# Patient Record
Sex: Female | Born: 1950
Health system: Southern US, Community
[De-identification: ages and names within clinical notes are randomized; demographics above are authoritative.]

## PROBLEM LIST (undated history)

## (undated) DIAGNOSIS — Z9889 Other specified postprocedural states: Secondary | ICD-10-CM

## (undated) DIAGNOSIS — I10 Essential (primary) hypertension: Secondary | ICD-10-CM

## (undated) DIAGNOSIS — F329 Major depressive disorder, single episode, unspecified: Secondary | ICD-10-CM

## (undated) DIAGNOSIS — M1990 Unspecified osteoarthritis, unspecified site: Secondary | ICD-10-CM

## (undated) DIAGNOSIS — D573 Sickle-cell trait: Secondary | ICD-10-CM

## (undated) DIAGNOSIS — R112 Nausea with vomiting, unspecified: Secondary | ICD-10-CM

## (undated) DIAGNOSIS — E669 Obesity, unspecified: Secondary | ICD-10-CM

## (undated) DIAGNOSIS — F32A Depression, unspecified: Secondary | ICD-10-CM

## (undated) DIAGNOSIS — R001 Bradycardia, unspecified: Secondary | ICD-10-CM

## (undated) DIAGNOSIS — J020 Streptococcal pharyngitis: Secondary | ICD-10-CM

## (undated) DIAGNOSIS — E785 Hyperlipidemia, unspecified: Secondary | ICD-10-CM

## (undated) DIAGNOSIS — I219 Acute myocardial infarction, unspecified: Secondary | ICD-10-CM

## (undated) DIAGNOSIS — I2510 Atherosclerotic heart disease of native coronary artery without angina pectoris: Secondary | ICD-10-CM

## (undated) DIAGNOSIS — K5730 Diverticulosis of large intestine without perforation or abscess without bleeding: Secondary | ICD-10-CM

## (undated) DIAGNOSIS — I350 Nonrheumatic aortic (valve) stenosis: Secondary | ICD-10-CM

## (undated) DIAGNOSIS — E119 Type 2 diabetes mellitus without complications: Secondary | ICD-10-CM

## (undated) DIAGNOSIS — L03039 Cellulitis of unspecified toe: Secondary | ICD-10-CM

## (undated) DIAGNOSIS — I272 Pulmonary hypertension, unspecified: Secondary | ICD-10-CM

## (undated) HISTORY — DX: Unspecified osteoarthritis, unspecified site: M19.90

## (undated) HISTORY — DX: Hyperlipidemia, unspecified: E78.5

## (undated) HISTORY — DX: Type 2 diabetes mellitus without complications: E11.9

## (undated) HISTORY — DX: Acute myocardial infarction, unspecified: I21.9

## (undated) HISTORY — DX: Bradycardia, unspecified: R00.1

## (undated) HISTORY — DX: Pulmonary hypertension, unspecified: I27.20

## (undated) HISTORY — DX: Cellulitis of unspecified toe: L03.039

## (undated) HISTORY — DX: Depression, unspecified: F32.A

## (undated) HISTORY — DX: Sickle-cell trait: D57.3

## (undated) HISTORY — PX: CARDIAC CATHETERIZATION: SHX172

## (undated) HISTORY — DX: Streptococcal pharyngitis: J02.0

## (undated) HISTORY — DX: Atherosclerotic heart disease of native coronary artery without angina pectoris: I25.10

## (undated) HISTORY — DX: Essential (primary) hypertension: I10

## (undated) HISTORY — PX: JOINT REPLACEMENT: SHX530

## (undated) HISTORY — DX: Obesity, unspecified: E66.9

## (undated) HISTORY — DX: Nonrheumatic aortic (valve) stenosis: I35.0

## (undated) HISTORY — DX: Diverticulosis of large intestine without perforation or abscess without bleeding: K57.30

---
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
## 1998-01-21 ENCOUNTER — Encounter: Admission: RE | Admit: 1998-01-21 | Discharge: 1998-01-21 | Payer: Self-pay | Admitting: Family Medicine

## 1998-03-23 ENCOUNTER — Encounter: Admission: RE | Admit: 1998-03-23 | Discharge: 1998-03-23 | Payer: Self-pay | Admitting: Sports Medicine

## 1999-04-22 ENCOUNTER — Encounter: Admission: RE | Admit: 1999-04-22 | Discharge: 1999-04-22 | Payer: Self-pay | Admitting: Family Medicine

## 1999-09-28 ENCOUNTER — Encounter: Admission: RE | Admit: 1999-09-28 | Discharge: 1999-09-28 | Payer: Self-pay | Admitting: Family Medicine

## 1999-10-04 ENCOUNTER — Encounter: Payer: Self-pay | Admitting: Sports Medicine

## 1999-10-04 ENCOUNTER — Encounter: Admission: RE | Admit: 1999-10-04 | Discharge: 1999-10-04 | Payer: Self-pay | Admitting: *Deleted

## 1999-10-26 ENCOUNTER — Encounter: Admission: RE | Admit: 1999-10-26 | Discharge: 1999-10-26 | Payer: Self-pay | Admitting: Family Medicine

## 1999-11-04 ENCOUNTER — Encounter: Admission: RE | Admit: 1999-11-04 | Discharge: 1999-11-04 | Payer: Self-pay | Admitting: Family Medicine

## 2000-05-31 ENCOUNTER — Encounter: Admission: RE | Admit: 2000-05-31 | Discharge: 2000-05-31 | Payer: Self-pay | Admitting: Family Medicine

## 2000-08-02 ENCOUNTER — Encounter: Admission: RE | Admit: 2000-08-02 | Discharge: 2000-08-02 | Payer: Self-pay | Admitting: Family Medicine

## 2000-08-02 ENCOUNTER — Encounter: Admission: RE | Admit: 2000-08-02 | Discharge: 2000-08-02 | Payer: Self-pay | Admitting: *Deleted

## 2000-08-02 ENCOUNTER — Encounter: Payer: Self-pay | Admitting: Family Medicine

## 2000-08-30 ENCOUNTER — Inpatient Hospital Stay (HOSPITAL_COMMUNITY): Admission: EM | Admit: 2000-08-30 | Discharge: 2000-09-04 | Payer: Self-pay | Admitting: Emergency Medicine

## 2000-08-30 ENCOUNTER — Encounter: Payer: Self-pay | Admitting: Emergency Medicine

## 2000-09-21 ENCOUNTER — Encounter: Admission: RE | Admit: 2000-09-21 | Discharge: 2000-09-21 | Payer: Self-pay | Admitting: Family Medicine

## 2000-09-21 HISTORY — PX: CAROTID STENT: SHX1301

## 2000-09-21 HISTORY — PX: ANGIOPLASTY: SHX39

## 2000-10-05 ENCOUNTER — Encounter: Payer: Self-pay | Admitting: Family Medicine

## 2000-10-05 ENCOUNTER — Encounter: Admission: RE | Admit: 2000-10-05 | Discharge: 2000-10-05 | Payer: Self-pay | Admitting: *Deleted

## 2000-10-24 ENCOUNTER — Encounter: Admission: RE | Admit: 2000-10-24 | Discharge: 2000-10-24 | Payer: Self-pay | Admitting: Family Medicine

## 2000-10-29 ENCOUNTER — Encounter: Payer: Self-pay | Admitting: Cardiology

## 2000-10-29 ENCOUNTER — Ambulatory Visit (HOSPITAL_COMMUNITY): Admission: RE | Admit: 2000-10-29 | Discharge: 2000-10-29 | Payer: Self-pay | Admitting: Cardiology

## 2001-03-27 ENCOUNTER — Encounter: Admission: RE | Admit: 2001-03-27 | Discharge: 2001-03-27 | Payer: Self-pay | Admitting: Family Medicine

## 2001-03-28 ENCOUNTER — Encounter: Admission: RE | Admit: 2001-03-28 | Discharge: 2001-03-28 | Payer: Self-pay | Admitting: Family Medicine

## 2001-04-02 ENCOUNTER — Encounter: Admission: RE | Admit: 2001-04-02 | Discharge: 2001-04-02 | Payer: Self-pay | Admitting: Family Medicine

## 2001-06-27 ENCOUNTER — Encounter: Admission: RE | Admit: 2001-06-27 | Discharge: 2001-06-27 | Payer: Self-pay | Admitting: Family Medicine

## 2001-07-02 ENCOUNTER — Emergency Department (HOSPITAL_COMMUNITY): Admission: EM | Admit: 2001-07-02 | Discharge: 2001-07-02 | Payer: Self-pay | Admitting: Emergency Medicine

## 2001-11-27 ENCOUNTER — Encounter: Admission: RE | Admit: 2001-11-27 | Discharge: 2001-11-27 | Payer: Self-pay | Admitting: Family Medicine

## 2001-11-27 ENCOUNTER — Encounter: Payer: Self-pay | Admitting: Sports Medicine

## 2001-12-12 ENCOUNTER — Encounter: Admission: RE | Admit: 2001-12-12 | Discharge: 2001-12-12 | Payer: Self-pay | Admitting: Family Medicine

## 2001-12-25 ENCOUNTER — Encounter: Admission: RE | Admit: 2001-12-25 | Discharge: 2001-12-25 | Payer: Self-pay | Admitting: Family Medicine

## 2002-01-29 ENCOUNTER — Encounter: Admission: RE | Admit: 2002-01-29 | Discharge: 2002-01-29 | Payer: Self-pay | Admitting: Family Medicine

## 2002-02-19 ENCOUNTER — Encounter: Admission: RE | Admit: 2002-02-19 | Discharge: 2002-02-19 | Payer: Self-pay | Admitting: Family Medicine

## 2002-03-11 ENCOUNTER — Encounter: Admission: RE | Admit: 2002-03-11 | Discharge: 2002-03-11 | Payer: Self-pay | Admitting: Family Medicine

## 2002-09-30 ENCOUNTER — Encounter: Admission: RE | Admit: 2002-09-30 | Discharge: 2002-09-30 | Payer: Self-pay | Admitting: Family Medicine

## 2002-11-14 ENCOUNTER — Encounter: Admission: RE | Admit: 2002-11-14 | Discharge: 2002-11-14 | Payer: Self-pay | Admitting: Family Medicine

## 2003-01-28 ENCOUNTER — Encounter: Admission: RE | Admit: 2003-01-28 | Discharge: 2003-01-28 | Payer: Self-pay | Admitting: Family Medicine

## 2003-02-09 ENCOUNTER — Encounter: Admission: RE | Admit: 2003-02-09 | Discharge: 2003-02-09 | Payer: Self-pay | Admitting: Family Medicine

## 2003-08-06 ENCOUNTER — Encounter: Admission: RE | Admit: 2003-08-06 | Discharge: 2003-08-06 | Payer: Self-pay | Admitting: Family Medicine

## 2003-09-30 ENCOUNTER — Encounter: Admission: RE | Admit: 2003-09-30 | Discharge: 2003-09-30 | Payer: Self-pay | Admitting: Sports Medicine

## 2003-10-14 ENCOUNTER — Ambulatory Visit (HOSPITAL_COMMUNITY): Admission: RE | Admit: 2003-10-14 | Discharge: 2003-10-14 | Payer: Self-pay | Admitting: Sports Medicine

## 2004-04-14 ENCOUNTER — Encounter: Admission: RE | Admit: 2004-04-14 | Discharge: 2004-04-14 | Payer: Self-pay | Admitting: Family Medicine

## 2004-05-07 ENCOUNTER — Emergency Department (HOSPITAL_COMMUNITY): Admission: AD | Admit: 2004-05-07 | Discharge: 2004-05-07 | Payer: Self-pay | Admitting: *Deleted

## 2004-05-18 ENCOUNTER — Encounter: Admission: RE | Admit: 2004-05-18 | Discharge: 2004-05-18 | Payer: Self-pay | Admitting: Sports Medicine

## 2004-07-22 ENCOUNTER — Ambulatory Visit: Payer: Self-pay | Admitting: Family Medicine

## 2004-10-14 ENCOUNTER — Ambulatory Visit: Payer: Self-pay | Admitting: Family Medicine

## 2004-11-03 ENCOUNTER — Ambulatory Visit (HOSPITAL_COMMUNITY): Admission: RE | Admit: 2004-11-03 | Discharge: 2004-11-03 | Payer: Self-pay | Admitting: Obstetrics & Gynecology

## 2005-03-27 ENCOUNTER — Ambulatory Visit: Payer: Self-pay | Admitting: Family Medicine

## 2005-08-30 ENCOUNTER — Ambulatory Visit: Payer: Self-pay | Admitting: Family Medicine

## 2005-11-09 ENCOUNTER — Encounter (INDEPENDENT_AMBULATORY_CARE_PROVIDER_SITE_OTHER): Payer: Self-pay | Admitting: *Deleted

## 2005-11-09 LAB — CONVERTED CEMR LAB

## 2005-11-22 ENCOUNTER — Ambulatory Visit: Payer: Self-pay | Admitting: Family Medicine

## 2005-12-20 ENCOUNTER — Ambulatory Visit (HOSPITAL_COMMUNITY): Admission: RE | Admit: 2005-12-20 | Discharge: 2005-12-20 | Payer: Self-pay | Admitting: Family Medicine

## 2005-12-21 ENCOUNTER — Ambulatory Visit: Payer: Self-pay | Admitting: Family Medicine

## 2006-01-10 ENCOUNTER — Encounter: Admission: RE | Admit: 2006-01-10 | Discharge: 2006-01-10 | Payer: Self-pay | Admitting: Sports Medicine

## 2006-01-19 ENCOUNTER — Ambulatory Visit: Payer: Self-pay | Admitting: Family Medicine

## 2006-08-07 ENCOUNTER — Ambulatory Visit: Payer: Self-pay | Admitting: Family Medicine

## 2006-12-06 DIAGNOSIS — I10 Essential (primary) hypertension: Secondary | ICD-10-CM | POA: Insufficient documentation

## 2006-12-06 DIAGNOSIS — M1710 Unilateral primary osteoarthritis, unspecified knee: Secondary | ICD-10-CM

## 2006-12-06 DIAGNOSIS — D573 Sickle-cell trait: Secondary | ICD-10-CM

## 2006-12-06 DIAGNOSIS — E119 Type 2 diabetes mellitus without complications: Secondary | ICD-10-CM | POA: Insufficient documentation

## 2006-12-06 DIAGNOSIS — IMO0002 Reserved for concepts with insufficient information to code with codable children: Secondary | ICD-10-CM | POA: Insufficient documentation

## 2006-12-06 DIAGNOSIS — I2510 Atherosclerotic heart disease of native coronary artery without angina pectoris: Secondary | ICD-10-CM | POA: Insufficient documentation

## 2006-12-06 DIAGNOSIS — E669 Obesity, unspecified: Secondary | ICD-10-CM | POA: Insufficient documentation

## 2006-12-07 ENCOUNTER — Encounter (INDEPENDENT_AMBULATORY_CARE_PROVIDER_SITE_OTHER): Payer: Self-pay | Admitting: *Deleted

## 2006-12-25 ENCOUNTER — Telehealth (INDEPENDENT_AMBULATORY_CARE_PROVIDER_SITE_OTHER): Payer: Self-pay | Admitting: Family Medicine

## 2006-12-26 ENCOUNTER — Ambulatory Visit (HOSPITAL_COMMUNITY): Admission: RE | Admit: 2006-12-26 | Discharge: 2006-12-26 | Payer: Self-pay | Admitting: Sports Medicine

## 2006-12-26 ENCOUNTER — Encounter (INDEPENDENT_AMBULATORY_CARE_PROVIDER_SITE_OTHER): Payer: Self-pay | Admitting: Family Medicine

## 2007-01-03 ENCOUNTER — Encounter (INDEPENDENT_AMBULATORY_CARE_PROVIDER_SITE_OTHER): Payer: Self-pay | Admitting: Family Medicine

## 2007-01-04 ENCOUNTER — Encounter (INDEPENDENT_AMBULATORY_CARE_PROVIDER_SITE_OTHER): Payer: Self-pay | Admitting: Family Medicine

## 2007-03-06 ENCOUNTER — Ambulatory Visit: Payer: Self-pay | Admitting: Family Medicine

## 2007-03-06 LAB — CONVERTED CEMR LAB: Hgb A1c MFr Bld: 6.4 %

## 2007-03-12 ENCOUNTER — Telehealth (INDEPENDENT_AMBULATORY_CARE_PROVIDER_SITE_OTHER): Payer: Self-pay | Admitting: *Deleted

## 2007-05-07 ENCOUNTER — Telehealth: Payer: Self-pay | Admitting: *Deleted

## 2007-05-27 ENCOUNTER — Telehealth: Payer: Self-pay | Admitting: Family Medicine

## 2007-08-06 ENCOUNTER — Encounter (INDEPENDENT_AMBULATORY_CARE_PROVIDER_SITE_OTHER): Payer: Self-pay | Admitting: Family Medicine

## 2007-08-06 ENCOUNTER — Ambulatory Visit (HOSPITAL_COMMUNITY): Admission: RE | Admit: 2007-08-06 | Discharge: 2007-08-06 | Payer: Self-pay | Admitting: Family Medicine

## 2007-08-06 ENCOUNTER — Ambulatory Visit: Payer: Self-pay | Admitting: Family Medicine

## 2007-08-06 LAB — CONVERTED CEMR LAB
ALT: 18 units/L (ref 0–35)
AST: 19 units/L (ref 0–37)
Albumin: 4.4 g/dL (ref 3.5–5.2)
Alkaline Phosphatase: 66 units/L (ref 39–117)
BUN: 16 mg/dL (ref 6–23)
CO2: 27 meq/L (ref 19–32)
Calcium: 10 mg/dL (ref 8.4–10.5)
Chloride: 104 meq/L (ref 96–112)
Cholesterol: 170 mg/dL (ref 0–200)
Creatinine, Ser: 0.81 mg/dL (ref 0.40–1.20)
Glucose, Bld: 99 mg/dL (ref 70–99)
HDL: 46 mg/dL (ref >39)
Hgb A1c MFr Bld: 6.6 %
LDL Cholesterol: 91 mg/dL (ref 0–99)
Potassium: 4 meq/L (ref 3.5–5.3)
Sodium: 142 meq/L (ref 135–145)
TSH: 0.972 microintl units/mL (ref 0.350–5.50)
Total Bilirubin: 0.4 mg/dL (ref 0.3–1.2)
Total CHOL/HDL Ratio: 3.7
Total Protein: 7.2 g/dL (ref 6.0–8.3)
Triglycerides: 163 mg/dL — ABNORMAL HIGH (ref <150)
VLDL: 33 mg/dL (ref 0–40)

## 2007-08-08 ENCOUNTER — Encounter (INDEPENDENT_AMBULATORY_CARE_PROVIDER_SITE_OTHER): Payer: Self-pay | Admitting: Family Medicine

## 2007-08-08 ENCOUNTER — Ambulatory Visit: Payer: Self-pay | Admitting: Family Medicine

## 2007-08-08 ENCOUNTER — Telehealth (INDEPENDENT_AMBULATORY_CARE_PROVIDER_SITE_OTHER): Payer: Self-pay | Admitting: Family Medicine

## 2007-08-09 ENCOUNTER — Encounter (INDEPENDENT_AMBULATORY_CARE_PROVIDER_SITE_OTHER): Payer: Self-pay | Admitting: Family Medicine

## 2007-09-02 ENCOUNTER — Telehealth: Payer: Self-pay | Admitting: *Deleted

## 2007-10-09 ENCOUNTER — Ambulatory Visit: Payer: Self-pay | Admitting: Family Medicine

## 2007-10-14 ENCOUNTER — Telehealth: Payer: Self-pay | Admitting: *Deleted

## 2007-10-16 ENCOUNTER — Ambulatory Visit: Payer: Self-pay | Admitting: Cardiovascular Disease

## 2007-10-16 ENCOUNTER — Ambulatory Visit (HOSPITAL_COMMUNITY): Admission: RE | Admit: 2007-10-16 | Discharge: 2007-10-16 | Payer: Self-pay | Admitting: Sports Medicine

## 2007-11-06 ENCOUNTER — Ambulatory Visit: Payer: Self-pay | Admitting: Cardiology

## 2007-11-25 ENCOUNTER — Ambulatory Visit: Payer: Self-pay

## 2007-11-25 ENCOUNTER — Ambulatory Visit: Payer: Self-pay | Admitting: Cardiology

## 2007-11-25 LAB — CONVERTED CEMR LAB
ALT: 23 units/L (ref 0–35)
AST: 21 units/L (ref 0–37)
Albumin: 3.9 g/dL (ref 3.5–5.2)
Bilirubin, Direct: 0.1 mg/dL (ref 0.0–0.3)
Calcium: 9.6 mg/dL (ref 8.4–10.5)
Chloride: 104 meq/L (ref 96–112)
Cholesterol: 158 mg/dL (ref 0–200)
Creatinine, Ser: 0.8 mg/dL (ref 0.4–1.2)
GFR calc non Af Amer: 79 mL/min
Glucose, Bld: 115 mg/dL — ABNORMAL HIGH (ref 70–99)
HDL: 44.3 mg/dL (ref >39.0)
LDL Cholesterol: 93 mg/dL (ref 0–99)
Sodium: 141 meq/L (ref 135–145)
Total Bilirubin: 0.7 mg/dL (ref 0.3–1.2)

## 2007-12-05 ENCOUNTER — Ambulatory Visit: Payer: Self-pay | Admitting: Cardiology

## 2008-05-22 ENCOUNTER — Encounter: Payer: Self-pay | Admitting: *Deleted

## 2008-06-02 ENCOUNTER — Ambulatory Visit: Payer: Self-pay | Admitting: Family Medicine

## 2008-06-02 ENCOUNTER — Encounter (INDEPENDENT_AMBULATORY_CARE_PROVIDER_SITE_OTHER): Payer: Self-pay | Admitting: Family Medicine

## 2008-06-02 DIAGNOSIS — I359 Nonrheumatic aortic valve disorder, unspecified: Secondary | ICD-10-CM | POA: Insufficient documentation

## 2008-06-02 LAB — CONVERTED CEMR LAB
BUN: 12 mg/dL (ref 6–23)
CO2: 25 meq/L (ref 19–32)
Calcium: 9.5 mg/dL (ref 8.4–10.5)
Chloride: 104 meq/L (ref 96–112)
Cholesterol: 146 mg/dL (ref 0–200)
Creatinine, Ser: 0.72 mg/dL (ref 0.40–1.20)
HDL: 43 mg/dL (ref >39)
Hgb A1c MFr Bld: 6.6 %
Total Bilirubin: 0.4 mg/dL (ref 0.3–1.2)
Total CHOL/HDL Ratio: 3.4
VLDL: 25 mg/dL (ref 0–40)

## 2008-06-03 ENCOUNTER — Encounter (INDEPENDENT_AMBULATORY_CARE_PROVIDER_SITE_OTHER): Payer: Self-pay | Admitting: Family Medicine

## 2008-07-13 ENCOUNTER — Ambulatory Visit: Payer: Self-pay | Admitting: Family Medicine

## 2008-08-10 ENCOUNTER — Telehealth: Payer: Self-pay | Admitting: *Deleted

## 2008-09-01 ENCOUNTER — Ambulatory Visit: Payer: Self-pay | Admitting: Family Medicine

## 2008-09-01 ENCOUNTER — Encounter (INDEPENDENT_AMBULATORY_CARE_PROVIDER_SITE_OTHER): Payer: Self-pay | Admitting: Family Medicine

## 2008-09-02 ENCOUNTER — Ambulatory Visit (HOSPITAL_COMMUNITY): Admission: RE | Admit: 2008-09-02 | Discharge: 2008-09-02 | Payer: Self-pay | Admitting: Family Medicine

## 2008-10-16 ENCOUNTER — Encounter (INDEPENDENT_AMBULATORY_CARE_PROVIDER_SITE_OTHER): Payer: Self-pay | Admitting: Family Medicine

## 2008-10-16 DIAGNOSIS — K5730 Diverticulosis of large intestine without perforation or abscess without bleeding: Secondary | ICD-10-CM | POA: Insufficient documentation

## 2008-12-17 ENCOUNTER — Telehealth (INDEPENDENT_AMBULATORY_CARE_PROVIDER_SITE_OTHER): Payer: Self-pay | Admitting: Family Medicine

## 2008-12-22 ENCOUNTER — Encounter (INDEPENDENT_AMBULATORY_CARE_PROVIDER_SITE_OTHER): Payer: Self-pay | Admitting: Family Medicine

## 2008-12-22 ENCOUNTER — Telehealth (INDEPENDENT_AMBULATORY_CARE_PROVIDER_SITE_OTHER): Payer: Self-pay | Admitting: Family Medicine

## 2009-01-14 ENCOUNTER — Telehealth (INDEPENDENT_AMBULATORY_CARE_PROVIDER_SITE_OTHER): Payer: Self-pay | Admitting: Family Medicine

## 2009-01-20 ENCOUNTER — Telehealth (INDEPENDENT_AMBULATORY_CARE_PROVIDER_SITE_OTHER): Payer: Self-pay | Admitting: Family Medicine

## 2009-01-29 ENCOUNTER — Encounter (INDEPENDENT_AMBULATORY_CARE_PROVIDER_SITE_OTHER): Payer: Self-pay | Admitting: Family Medicine

## 2009-01-29 ENCOUNTER — Ambulatory Visit: Payer: Self-pay | Admitting: Family Medicine

## 2009-01-29 LAB — CONVERTED CEMR LAB
BUN: 14 mg/dL (ref 6–23)
CO2: 25 meq/L (ref 19–32)
Calcium: 9.3 mg/dL (ref 8.4–10.5)
Chloride: 103 meq/L (ref 96–112)
Cholesterol: 121 mg/dL (ref 0–200)
Creatinine, Ser: 0.88 mg/dL (ref 0.40–1.20)
Glucose, Bld: 100 mg/dL — ABNORMAL HIGH (ref 70–99)
HDL: 39 mg/dL — ABNORMAL LOW (ref >39)
Total Bilirubin: 0.3 mg/dL (ref 0.3–1.2)
Total CHOL/HDL Ratio: 3.1
Triglycerides: 90 mg/dL (ref <150)

## 2009-02-01 ENCOUNTER — Encounter (INDEPENDENT_AMBULATORY_CARE_PROVIDER_SITE_OTHER): Payer: Self-pay | Admitting: Family Medicine

## 2009-05-31 ENCOUNTER — Ambulatory Visit: Payer: Self-pay | Admitting: Family Medicine

## 2009-05-31 DIAGNOSIS — E785 Hyperlipidemia, unspecified: Secondary | ICD-10-CM | POA: Insufficient documentation

## 2009-05-31 LAB — CONVERTED CEMR LAB: Hgb A1c MFr Bld: 6.7 %

## 2009-08-19 ENCOUNTER — Telehealth: Payer: Self-pay | Admitting: Family Medicine

## 2009-08-23 ENCOUNTER — Telehealth (INDEPENDENT_AMBULATORY_CARE_PROVIDER_SITE_OTHER): Payer: Self-pay | Admitting: *Deleted

## 2009-09-22 ENCOUNTER — Ambulatory Visit: Payer: Self-pay | Admitting: Family Medicine

## 2009-09-22 ENCOUNTER — Encounter: Payer: Self-pay | Admitting: Family Medicine

## 2009-09-22 DIAGNOSIS — L03039 Cellulitis of unspecified toe: Secondary | ICD-10-CM

## 2009-09-22 LAB — CONVERTED CEMR LAB
BUN: 15 mg/dL (ref 6–23)
CO2: 25 meq/L (ref 19–32)
Calcium: 9.6 mg/dL (ref 8.4–10.5)
Chloride: 105 meq/L (ref 96–112)
Creatinine, Ser: 0.73 mg/dL (ref 0.40–1.20)
Glucose, Bld: 105 mg/dL — ABNORMAL HIGH (ref 70–99)
Hgb A1c MFr Bld: 6.4 %
Potassium: 4.2 meq/L (ref 3.5–5.3)
Sodium: 143 meq/L (ref 135–145)

## 2009-11-26 ENCOUNTER — Telehealth: Payer: Self-pay | Admitting: Family Medicine

## 2009-11-26 ENCOUNTER — Ambulatory Visit: Payer: Self-pay | Admitting: Family Medicine

## 2009-11-26 DIAGNOSIS — J020 Streptococcal pharyngitis: Secondary | ICD-10-CM | POA: Insufficient documentation

## 2009-11-26 DIAGNOSIS — J029 Acute pharyngitis, unspecified: Secondary | ICD-10-CM

## 2010-02-28 ENCOUNTER — Telehealth: Payer: Self-pay | Admitting: Family Medicine

## 2010-03-01 ENCOUNTER — Telehealth: Payer: Self-pay | Admitting: *Deleted

## 2010-03-03 ENCOUNTER — Telehealth: Payer: Self-pay | Admitting: *Deleted

## 2010-03-03 ENCOUNTER — Encounter: Payer: Self-pay | Admitting: *Deleted

## 2010-05-02 ENCOUNTER — Ambulatory Visit: Payer: Self-pay | Admitting: Family Medicine

## 2010-05-02 LAB — CONVERTED CEMR LAB: Hgb A1c MFr Bld: 6.5 %

## 2010-05-04 ENCOUNTER — Ambulatory Visit: Payer: Self-pay | Admitting: Family Medicine

## 2010-06-10 ENCOUNTER — Encounter: Payer: Self-pay | Admitting: *Deleted

## 2010-06-21 ENCOUNTER — Ambulatory Visit: Payer: Self-pay | Admitting: Family Medicine

## 2010-06-21 DIAGNOSIS — M1990 Unspecified osteoarthritis, unspecified site: Secondary | ICD-10-CM | POA: Insufficient documentation

## 2010-08-09 ENCOUNTER — Encounter (INDEPENDENT_AMBULATORY_CARE_PROVIDER_SITE_OTHER): Payer: Self-pay | Admitting: Pharmacist

## 2010-08-31 ENCOUNTER — Ambulatory Visit (HOSPITAL_COMMUNITY)
Admission: RE | Admit: 2010-08-31 | Discharge: 2010-08-31 | Payer: Self-pay | Source: Home / Self Care | Admitting: Family Medicine

## 2010-10-04 ENCOUNTER — Ambulatory Visit: Payer: Self-pay | Admitting: Family Medicine

## 2010-10-04 ENCOUNTER — Encounter: Payer: Self-pay | Admitting: Family Medicine

## 2010-10-04 DIAGNOSIS — M79609 Pain in unspecified limb: Secondary | ICD-10-CM

## 2010-10-04 LAB — CONVERTED CEMR LAB
ALT: 16 units/L (ref 0–35)
AST: 20 units/L (ref 0–37)
Albumin: 4.4 g/dL (ref 3.5–5.2)
Alkaline Phosphatase: 63 units/L (ref 39–117)
BUN: 16 mg/dL (ref 6–23)
CO2: 24 meq/L (ref 19–32)
Calcium: 9.6 mg/dL (ref 8.4–10.5)
Chloride: 104 meq/L (ref 96–112)
Cholesterol: 176 mg/dL (ref 0–200)
Creatinine, Ser: 0.69 mg/dL (ref 0.40–1.20)
Glucose, Bld: 103 mg/dL — ABNORMAL HIGH (ref 70–99)
HCT: 34.2 % — ABNORMAL LOW (ref 36.0–46.0)
HDL: 52 mg/dL (ref >39)
Hemoglobin: 11.1 g/dL — ABNORMAL LOW (ref 12.0–15.0)
Hgb A1c MFr Bld: 6.2 %
LDL Cholesterol: 105 mg/dL — ABNORMAL HIGH (ref 0–99)
MCHC: 32.5 g/dL (ref 30.0–36.0)
MCV: 85.9 fL (ref 78.0–100.0)
Platelets: 384 10*3/uL (ref 150–400)
Potassium: 4 meq/L (ref 3.5–5.3)
RBC: 3.98 M/uL (ref 3.87–5.11)
RDW: 15.5 % (ref 11.5–15.5)
Sodium: 142 meq/L (ref 135–145)
Total Bilirubin: 0.3 mg/dL (ref 0.3–1.2)
Total CHOL/HDL Ratio: 3.4
Total CK: 139 units/L (ref 7–177)
Total Protein: 7.3 g/dL (ref 6.0–8.3)
Triglycerides: 95 mg/dL (ref <150)
VLDL: 19 mg/dL (ref 0–40)
Vitamin B-12: 345 pg/mL (ref 211–911)
WBC: 5 10*3/uL (ref 4.0–10.5)

## 2010-10-30 ENCOUNTER — Encounter: Payer: Self-pay | Admitting: Family Medicine

## 2010-11-04 ENCOUNTER — Ambulatory Visit: Admit: 2010-11-04 | Payer: Self-pay | Admitting: Cardiology

## 2010-11-08 NOTE — Assessment & Plan Note (Signed)
Summary: sore throat/[place]/wallace   Vital Signs:  Patient profile:   60 year old female Height:      61.5 inches Weight:      217.9 pounds BMI:     40.65 Temp:     98.0 degrees F oral Pulse rate:   100 / minute BP sitting:   116 / 70  (left arm) Cuff size:   large  Vitals Entered By: Gladstone Pih (November 26, 2009 2:35 PM) CC: C/O sore throat X 1 day Is Patient Diabetic? Yes Did you bring your meter with you today? No Pain Assessment Patient in pain? no        Primary Care Provider:  Helane Rima DO  CC:  C/O sore throat X 1 day.  History of Present Illness:    Pt presents with sore throat x 1 day. 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 She denies any fever, sob, n/v, diarrhea tolerating her meds as previous.   Habits & Providers  Alcohol-Tobacco-Diet     Tobacco Status: never  Current Medications (verified): 1)  Isosorbide Mononitrate Cr 30 Mg Tb24 (Isosorbide Mononitrate) .... Take 1 Tablet By Mouth Once A Day 2)  Metformin Hcl 1000 Mg Tabs (Metformin Hcl) .... Take 1 Tablet By Mouth Two Times A Day 3)  Metoprolol Tartrate 100 Mg Tabs (Metoprolol Tartrate) .... 1/2  Tablet By Mouth Two Times A Day 4)  Norvasc 5 Mg Tabs (Amlodipine Besylate) .Marland Kitchen.. 1 Tablet By Mouth Daily For Blood Pressure 5)  Simvastatin 80 Mg Tabs (Simvastatin) .Marland Kitchen.. 1 By Mouth Daily For Cholesterol 6)  Bayer Aspirin 325 Mg Tabs (Aspirin) .Marland Kitchen.. 1 Tablet By Mouth Daily 7)  Calcium 600 1500 Mg Tabs (Calcium Carbonate) .Marland Kitchen.. 1 Tablet By Mouth Daily 8)  Fish Oil 1200 Mg Caps (Omega-3 Fatty Acids) .Marland Kitchen.. 1 Tablet By Mouth Two Times A Day 9)  Hydrochlorothiazide 25 Mg  Tabs (Hydrochlorothiazide) .... Take 1 Tab By Mouth Every Morning  Allergies (verified): 1)  ! Ace Inhibitors  Review of Systems       Per HPI  Physical Exam  General:  obese, pleasant. vitals reviewed. Eyes:  No corneal or conjunctival inflammation  noted. EOMI. Perrla. 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 Given red flags to return Her updated medication list for this problem includes:    Bayer Aspirin 325 Mg Tabs (Aspirin) .Marland Kitchen... 1 tablet by mouth daily  Orders: [facility]- Est Level  3 ([address]213) Bicillin LA 1.2 million units Injection (S0630)  Complete Medication List: 1)  Isosorbide Mononitrate Cr 30 Mg Tb24 (Isosorbide mononitrate) .... Take 1 tablet by mouth once a day 2)  Metformin Hcl 1000 Mg Tabs (Metformin hcl) .... Take 1 tablet by mouth two times a day 3)  Metoprolol Tartrate 100 Mg Tabs (Metoprolol tartrate) .... 1/2  tablet by mouth two times a day 4)  Norvasc 5 Mg Tabs (Amlodipine besylate) .Marland Kitchen.. 1 tablet by mouth daily for blood pressure 5)  Simvastatin 80 Mg Tabs (Simvastatin) .Marland Kitchen.. 1 by mouth daily for cholesterol 6)  Bayer Aspirin 325 Mg Tabs (Aspirin) .Marland Kitchen.. 1 tablet by mouth daily 7)  Calcium  600 1500 Mg Tabs (Calcium carbonate) .Marland Kitchen.. 1 tablet by mouth daily 8)  Fish Oil 1200 Mg Caps (Omega-3 fatty acids) .Marland Kitchen.. 1 tablet by mouth two times a day 9)  Hydrochlorothiazide 25 Mg Tabs (Hydrochlorothiazide) .... Take 1 tab by mouth every morning  Other Orders: Rapid Strep-[facility] ([address])  Patient Instructions: 1)  Return to see Dr. Earlene Plater as scheduled 2)  If you have a high fever, vomiting, diarrhea or problems breathing let us know.   Laboratory Results  Date/Time Received: November 26, 2009 2:48 PM  Date/Time Reported: November 26, 2009 3:03 PM   Other Tests  Rapid Strep: positive Comments: ...............test performed by......Marland KitchenBonnie A. [place], MLS (ASCP)cm     Medication Administration  Injection #  1:    Medication: Bicillin LA 1.2 million units Injection    Diagnosis: STREPTOCOCCAL PHARYNGITIS (ICD-034.0)    Route: IM    Site: LUOQ gluteus    Exp Date: 04/2012    Lot #: [id number]    Mfr: Brooke Dare    Patient tolerated injection without complications    Given by: Gladstone Pih (November 26, 2009 3:30 PM)  Orders Added: 1)  Rapid Strep-[facility] [87430] 2)  [facility]- Est Level  3 [99213] 3)  Bicillin LA 1.2 million units Injection [J0570]

## 2010-11-08 NOTE — Progress Notes (Signed)
Summary: Rx REq   Phone Note Refill Request Message from:  Patient  Refills Requested: Medication #1:  SIMVASTATIN 80 MG TABS 1 by mouth daily for cholesterol [address].  Initial call taken by: Clydell Hakim,  Mar 03, 2010 3:25 PM  Follow-up for Phone Call        please send to Algis Liming. Follow-up by: Helane Rima DO,  Mar 04, 2010 12:40 PM    Prescriptions: SIMVASTATIN 80 MG TABS (SIMVASTATIN) 1 by mouth daily for cholesterol  #90 x 0   Entered by:   Arlyss Repress CMA,   Authorized by:   Helane Rima DO   Signed by:   Arlyss Repress CMA, on 03/04/2010   Method used:   Telephoned to ...       [facility] [address] (retail)       [address]       [place], [place]  [address]       Ph: [phone]       Fax: [phone]   RxID:   [id number]

## 2010-11-08 NOTE — Assessment & Plan Note (Signed)
Summary: diabetic ck,tcb   Vital Signs:  Patient profile:   60 year old female Height:      61.5 inches Weight:      225 pounds BMI:     41.98 Temp:     98.0 degrees F oral Pulse rate:   53 / minute Pulse rhythm:   regular BP sitting:   118 / 71  (right arm)  Vitals Entered By: Arlyss Repress CMA, (May 31, 2009 9:28 AM) CC: Follow up visit for DM Is Patient Diabetic? Yes  Pain Assessment Patient in pain? no        Diabetic Foot Exam Foot Inspection Is there a history of a foot ulcer?              No Is there a foot ulcer now?              No Can the patient see the bottom of their feet?          Yes Are the shoes appropriate in style and fit?          Yes Is there swelling or an abnormal foot shape?          No Are the toenails long?                No Are the toenails thick?                No Are the toenails ingrown?              No Is there heavy callous build-up?              No Is there pain in the calf muscle (Intermittent claudication) when walking?    NoIs there a claw toe deformity?              No Is there elevated skin temperature?            No Is there limited ankle dorsiflexion?            No Is there foot or ankle muscle weakness?            No  Diabetic Foot Care Education Patient educated on appropriate care of diabetic feet.  Pulse Check          Right Foot          Left Foot Posterior Tibial:        normal            normal Dorsalis Pedis:        normal            normal    10-g (5.07) Semmes-Weinstein Monofilament Test Performed by: Helane Rima DO          Right Foot          Left Foot Visual Inspection               Test Control      normal         normal Site 1         normal         normal Site 2         normal         normal Site 3         normal         normal Site 4         normal  normal Site 5         normal         normal Site 6         normal         normal Site 7         normal         normal Site 8         normal          normal Site 9         normal         normal Site 10         normal         normal  Impression      normal         normal   Primary Care Provider:  Helane Rima DO  CC:  Follow up visit for DM.  History of Present Illness: 60 year old obese AAF with:  1. HTN: Rx: metoprolol, norvasc, HCTZ.  had cough with ACE. denies CP, SOB, N/V/D, LE edema, HA, dizziness. + MI in 2001. has cardiologist. will see in december. today, BP 118/71, HR 53.  2. HLD: Rx: simvastatin 80mg . last FLP revealed LDL 78 (goal < 70).  3. DM2: does not check BG, states that glucometer broken. she has lost 6 pounds through diet and exercise, she has been avoiding foods with sugar and not eating after 7:30 pm, walking daily. she denies numbness/tingling in hands/feet, vision changes. she checks her feet often. last eye appt was 2 years ago.   4.  Obesity: has lost 6 pounds since last visit. Walking daily  ~1 mile.   Habits & Providers  Alcohol-Tobacco-Diet     Tobacco Status: quit > 6 months  Current Medications (verified): 1)  Isosorbide Mononitrate Cr 30 Mg Tb24 (Isosorbide Mononitrate) .... Take 1 Tablet By Mouth Once A Day 2)  Metformin Hcl 1000 Mg Tabs (Metformin Hcl) .... Take 1 Tablet By Mouth Two Times A Day 3)  Metoprolol Tartrate 100 Mg Tabs (Metoprolol Tartrate) .... 1/2  Tablet By Mouth Two Times A Day 4)  Norvasc 5 Mg Tabs (Amlodipine Besylate) .Marland Kitchen.. 1 Tablet By Mouth Daily For Blood Pressure 5)  Simvastatin 80 Mg Tabs (Simvastatin) .Marland Kitchen.. 1 By Mouth Daily For Cholesterol 6)  Bayer Aspirin 325 Mg Tabs (Aspirin) .Marland Kitchen.. 1 Tablet By Mouth Daily 7)  Calcium 600 1500 Mg Tabs (Calcium Carbonate) .Marland Kitchen.. 1 Tablet By Mouth Daily 8)  Fish Oil 1200 Mg Caps (Omega-3 Fatty Acids) .Marland Kitchen.. 1 Tablet By Mouth Two Times A Day 9)  Hydrochlorothiazide 25 Mg  Tabs (Hydrochlorothiazide) .... Take 1 Tab By Mouth Every Morning  Allergies (verified): 1)  ! Ace Inhibitors  Review of Systems       per HPI, otherwise  negative  Physical Exam  General:  obese, pleasant. vital signs reviewed. Lungs:  CTAB Heart:  bradycardic, regular rhythm, and grade 2/6 systolic ejection murmur, loudest at LUSB.     Extremities:  no edema  Diabetes Management Exam:    Foot Exam (with socks and/or shoes not present):       Sensory-Monofilament:          Left foot: normal          Right foot: normal   Impression & Recommendations:  Problem # 1:  DIABETES MELLITUS II, UNCOMPLICATED (ICD-250.00) Assessment Unchanged A1c = 6.7. No change to medication regimen. Continue with diet and exercise. Advised eye appt.  Her  updated medication list for this problem includes:    Metformin Hcl 1000 Mg Tabs (Metformin hcl) .Marland Kitchen... Take 1 tablet by mouth two times a day    Bayer Aspirin 325 Mg Tabs (Aspirin) .Marland Kitchen... 1 tablet by mouth daily  Orders: A1C-[facility] ([address]) [facility]- Est  Level 4 ([address]454)  Problem # 2:  OBESITY, NOS (ICD-278.00) Assessment: Improved  Congratulated her on weight loss efforts.  Orders: [facility]- Est  Level 4 (09811)  Problem # 3:  HYPERTENSION, BENIGN SYSTEMIC (ICD-401.1) HR low. Decreased Metoprolol to 1/2 pill daily. Follow up in 1-2 weeks for BP check. Patient had cough with ACE. Will add ARB as soon as it is affordable.  Her updated medication list for this problem includes:    Metoprolol Tartrate 100 Mg Tabs (Metoprolol tartrate) .Marland Kitchen... 1/2  tablet by mouth two times a day    Norvasc 5 Mg Tabs (Amlodipine besylate) .Marland Kitchen... 1 tablet by mouth daily for blood pressure    Hydrochlorothiazide 25 Mg Tabs (Hydrochlorothiazide) .Marland Kitchen... Take 1 tab by mouth every morning  Orders: [facility]- Est  Level 4 (99214)Future Orders: Basic Met-[facility] ([address]) ... 05/27/2010  Problem # 4:  HYPERLIPIDEMIA, CONTROLLED (ICD-272.4) Assessment: Unchanged  Her updated medication list for this problem includes:    Simvastatin 80 Mg Tabs (Simvastatin) .Marland Kitchen... 1 by mouth daily for cholesterol  Orders: [facility]- Est  Level 4 ([address]86)  Complete  Medication List: 1)  Isosorbide Mononitrate Cr 30 Mg Tb24 (Isosorbide mononitrate) .... Take 1 tablet by mouth once a day 2)  Metformin Hcl 1000 Mg Tabs (Metformin hcl) .... Take 1 tablet by mouth two times a day 3)  Metoprolol Tartrate 100 Mg Tabs (Metoprolol tartrate) .... 1/2  tablet by mouth two times a day 4)  Norvasc 5 Mg Tabs (Amlodipine besylate) .Marland Kitchen.. 1 tablet by mouth daily for blood pressure 5)  Simvastatin 80 Mg Tabs (Simvastatin) .Marland Kitchen.. 1 by mouth daily for cholesterol 6)  Bayer Aspirin 325 Mg Tabs (Aspirin) .Marland Kitchen.. 1 tablet by mouth daily 7)  Calcium 600 1500 Mg Tabs (Calcium carbonate) .Marland Kitchen.. 1 tablet by mouth daily 8)  Fish Oil 1200 Mg Caps (Omega-3 fatty acids) .Marland Kitchen.. 1 tablet by mouth two times a day 9)  Hydrochlorothiazide 25 Mg Tabs (Hydrochlorothiazide) .... Take 1 tab by mouth every morning  Patient Instructions: 1)  It was nice to meet yout today! 2)  We decreased your metoprolol to 1/2 pill twice daily. 3)  Continue daily walking and eating healthy foods. You have lost 6 pounds since your last visit in April. Keep up the good work! 4)  Come back in 1-2 weeks for a blood pressure check and to check your kidney function. Please schedule this as a nurse visit. 5)  You are due to have your eyes checked.  Prescriptions: HYDROCHLOROTHIAZIDE 25 MG  TABS (HYDROCHLOROTHIAZIDE) Take 1 tab by mouth every morning  #90 x 3   Entered and Authorized by:   Helane Rima MD   Signed by:   Helane Rima MD on 05/31/2009   Method used:   [facility]        [facility] [address] [phone]* (retail)       [address]       [place], [place]  [address]       Ph: [phone] or [phone]       Fax: [phone]   RxID:   [id number] METOPROLOL TARTRATE 100 MG TABS (METOPROLOL TARTRATE) 1/2  tablet by mouth  two times a day  #60 x 3   Entered and Authorized by:   Helane Rima MD   Signed by:   Helane Rima MD on 05/31/2009   Method used:   [facility]ronically to         [facility] [address] [phone]* (retail)       [address]       [place], [place]  [address]       Ph: [phone] or [phone]       Fax: [phone]   RxID:   [id number] LISINOPRIL-HYDROCHLOROTHIAZIDE 20-12.5 MG TABS (LISINOPRIL-HYDROCHLOROTHIAZIDE) 1 tablet by mouth daily  #90 x 3   Entered and Authorized by:   Helane Rima MD   Signed by:   Helane Rima MD on 05/31/2009   Method used:   Electronically to        [facility] [address] [phone]* (retail)       [address]       [place], [place]  [address]       Ph: [phone] or [phone]       Fax: [phone]   RxID:   [id number]   Laboratory Results   Blood Tests   Date/Time Received: May 31, 2009 9:35 AM  Date/Time Reported: May 31, 2009 10:02 AM   HGBA1C: 6.7%   (Normal Range: Non-Diabetic - 3-6%   Control Diabetic - 6-8%)  Comments: ...........test performed by...........Marland KitchenTerese Door, CMA       Prevention & Chronic Care Immunizations   Influenza vaccine: Fluvax Non-MCR  (07/15/2008)   Influenza vaccine due: 08/05/2008    Tetanus booster: 09/08/2002: Done.   Tetanus booster due: 09/08/2012    Pneumococcal vaccine: Not documented  Colorectal Screening   Hemoccult: Done.  (03/09/2005)   Hemoccult due: Not Indicated    Colonoscopy: Results: Diverticulosis.       Location:  [facility].     (10/05/2008)   Colonoscopy due: 10/05/2018  Other Screening   Pap smear: NEGATIVE FOR INTRAEPITHELIAL LESIONS OR MALIGNANCY.  (09/01/2008)   Pap smear due: 11/09/2006    Mammogram: ASSESSMENT: Negative - BI-RADS 1^MM DIGITAL SCREENING  (09/02/2008)   Mammogram due: 01/2008   Smoking status: quit > 6 months  (05/31/2009)  Diabetes Mellitus   HgbA1C: 6.7  (05/31/2009)   Hemoglobin A1C due: 11/06/2007    Eye exam: Not documented   Diabetic eye exam action/deferral: Ophthalmology referral  (05/31/2009)    Foot exam: yes   (05/31/2009)   Foot exam action/deferral: Do today   High risk foot: Not documented   Foot care education: Done  (05/31/2009)   Foot exam due: 08/05/2008    Urine microalbumin/creatinine ratio: Not documented    Diabetes flowsheet reviewed?: Yes   Progress toward A1C goal: At goal  Lipids   Total Cholesterol: 121  (01/29/2009)   LDL: 64  (01/29/2009)   LDL Direct: Not documented   HDL: 39  (01/29/2009)   Triglycerides: 90  (01/29/2009)    SGOT (AST): 17  (01/29/2009)   SGPT (ALT): 19  (01/29/2009)   Alkaline phosphatase: 67  (01/29/2009)   Total bilirubin: 0.3  (01/29/2009)    Lipid flowsheet reviewed?: Yes   Progress toward LDL goal: At goal  Hypertension   Last Blood Pressure: 118 / 71  (05/31/2009)   Serum creatinine: 0.88  (01/29/2009)   Serum potassium 4.5  (01/29/2009)    Hypertension flowsheet reviewed?: Yes  Progress toward BP goal: At goal  Self-Management Support :   Personal Goals (by the next clinic visit) :     Personal A1C goal: 7  (05/31/2009)     Personal blood pressure goal: 130/80  (05/31/2009)     Personal LDL goal: 70  (05/31/2009)    Patient will work on the following items until the next clinic visit to reach self-care goals:     Medications and monitoring: bring all of my medications to every visit  (05/31/2009)     Activity: take a 30 minute walk every day  (05/31/2009)    Diabetes self-management support: Written self-care plan  (05/31/2009)   Diabetes care plan printed    Hypertension self-management support: Written self-care plan  (05/31/2009)   Hypertension self-care plan printed.    Lipid self-management support: Written self-care plan  (05/31/2009)   Lipid self-care plan printed.   Nursing Instructions: Refer for screening diabetic eye exam (see order) Diabetic foot exam today

## 2010-11-08 NOTE — Assessment & Plan Note (Signed)
Summary: READ PPD/[facility]   Nurse Visit   Allergies: 1)  ! Ace Inhibitors  PPD Results    Date of reading: 05/04/2010    Results: 5 mm    Interpretation: negative  Orders Added: 1)  No Charge Patient Arrived (NCPA0) [NCPA0]   Dr.Hensel  came in and verified PPD reading.  Theresia Lo RN  May 04, 2010 9:54 AM

## 2010-11-08 NOTE — Letter (Signed)
Summary: Generic Letter  [facility]  [address]   [place], [place] [address]   Phone: [phone]  Fax: [phone]    09/22/2009  MELANNIE METZNER [address] [employer] [place], [place]  [address]  Dear Ms. SOBCZYK,  I am happy to inform you that the results of your recent labs were all normal. Let me know if you have any questions.   Sincerely,   Helane Rima DO  Appended Document: Generic Letter mailed.

## 2010-11-08 NOTE — Progress Notes (Signed)
Summary: Rx Question   Phone Note Refill Request Call back at Home Phone [phone] Message from:  Patient  Refills Requested: Medication #1:  METOPROLOL TARTRATE 100 MG TABS 1/2  tablet by mouth two times a day PT GOT A 30 DAY SUPPLY AND SAID SHE USUALLY GETS A 90 DAY SUPPLY.  [facility]  Initial call taken by: Clydell Hakim,  Feb 28, 2010 10:17 AM  Follow-up for Phone Call        sent in 90 day supply to pharmacy Follow-up by: Helane Rima DO,  Feb 28, 2010 10:49 AM    Prescriptions: METOPROLOL TARTRATE 100 MG TABS (METOPROLOL TARTRATE) 1/2  tablet by mouth two times a day  #180 x 4   Entered and Authorized by:   Helane Rima DO   Signed by:   Helane Rima DO on 02/28/2010   Method used:   Electronically to        [facility] [address] [phone]* (retail)       [address]       [place], [place]  [address]       Ph: [phone] or [phone]       Fax: [phone]   RxID:   [id number]

## 2010-11-08 NOTE — Assessment & Plan Note (Signed)
Summary: knee pain,tcb   Vital Signs:  Patient profile:   60 year old Estrada Weight:      216 pounds Temp:     98.2 degrees F oral Pulse rate:   57 / minute Pulse rhythm:   regular BP sitting:   151 / 89  (right arm) Cuff size:   large  Vitals Entered By: Loralee Pacas CMA (June 21, 2010 2:31 PM) CC: Right Knee Pain   Primary Care Provider:  Helane Rima DO  CC:  Right Knee Pain.  History of Present Illness: 60 yo F:  1. Knee Pain: Right, x several weeks, worse with walking and better with rest, pain does wake her from rest. + PMHx OA in other knee. No fever/chills, edema, warth, skin changes, recent or remote injury. Traking Tyelenol without significant relief.   Habits & Providers  Alcohol-Tobacco-Diet     Tobacco Status: quit > 6 months     Year Quit: 2001  Current Medications (verified): 1)  Isosorbide Mononitrate Cr 30 Mg Tb24 (Isosorbide Mononitrate) .... Take 1 Tablet By Mouth Once A Day 2)  Metformin Hcl 1000 Mg Tabs (Metformin Hcl) .... Take 1 Tablet By Mouth Two Times A Day 3)  Metoprolol Tartrate 50 Mg Tabs (Metoprolol Tartrate) .... Take 1 Tab By Mouth Two Times Per Day 4)  Norvasc 5 Mg Tabs (Amlodipine Besylate) .Marland Kitchen.. 1 Tablet By Mouth Daily For Blood Pressure 5)  Simvastatin 80 Mg Tabs (Simvastatin) .Marland Kitchen.. 1 By Mouth Daily For Cholesterol 6)  Bayer Aspirin 325 Mg Tabs (Aspirin) .Marland Kitchen.. 1 Tablet By Mouth Daily 7)  Calcium 600 1500 Mg Tabs (Calcium Carbonate) .Marland Kitchen.. 1 Tablet By Mouth Daily 8)  Fish Oil 1200 Mg Caps (Omega-3 Fatty Acids) .Marland Kitchen.. 1 Tablet By Mouth Two Times A Day 9)  Hydrochlorothiazide 25 Mg  Tabs (Hydrochlorothiazide) .... Take 1 Tab By Mouth Every Morning 10)  Naprosyn 500 Mg Tabs (Naproxen) .Marland Kitchen.. 1 Two Times A Day Po Prn  Allergies (verified): 1)  ! Ace Inhibitors PMH-FH-SH reviewed for relevance  Review of Systems General:  Denies chills and fever. MS:  Complains of joint pain and joint swelling; denies joint redness and muscle  weakness. Derm:  Denies rash. Neuro:  Denies falling down, numbness, and tingling.  Physical Exam  General:  Obese, pleasant. Vitals reviewed. Msk:  Left Knee: Obvious arthritis changes on visualization. + crepitus. Normal endpoints. Normal LE stregnth and reflexes. Neg McMurray's. Pulses:  2+ DP. Extremities:  No edema. Neurologic:  Sensation intact to light touch and DTRs symmetrical and normal.   Skin:  Intact without suspicious lesions or rashes.    Impression & Recommendations:  Problem # 1:  DEGENERATIVE JOINT DISEASE (ICD-715.90) Assessment Deteriorated Patient not amenable to Ultram. Discussed risks and benefits of NSAIDs for pain and inflammation control in this patient with OA as well as HTN/CAD. Advised Naproxen as needed for pain and inflammation (nonselective). Discussed need for ice for inflammation as well. Follow up in 3-4 if not improving. See patient instructions.  Her updated medication list for this problem includes:    Bayer Aspirin 325 Mg Tabs (Aspirin) .Marland Kitchen... 1 tablet by mouth daily    Naprosyn 500 Mg Tabs (Naproxen) .Marland Kitchen... 1 two times a day po prn  Orders: [facility]- Est Level  3 ([address])  Complete Medication List: 1)  Isosorbide Mononitrate Cr 30 Mg Tb24 (Isosorbide mononitrate) .... Take 1 tablet by mouth once a day 2)  Metformin Hcl 1000 Mg Tabs (Metformin hcl) .... Take 1 tablet by  mouth two times a day 3)  Metoprolol Tartrate 50 Mg Tabs (Metoprolol tartrate) .... Take 1 tab by mouth two times per day 4)  Norvasc 5 Mg Tabs (Amlodipine besylate) .Marland Kitchen.. 1 tablet by mouth daily for blood pressure 5)  Simvastatin 80 Mg Tabs (Simvastatin) .Marland Kitchen.. 1 by mouth daily for cholesterol 6)  Bayer Aspirin 325 Mg Tabs (Aspirin) .Marland Kitchen.. 1 tablet by mouth daily 7)  Calcium 600 1500 Mg Tabs (Calcium carbonate) .Marland Kitchen.. 1 tablet by mouth daily 8)  Fish Oil 1200 Mg Caps (Omega-3 fatty acids) .Marland Kitchen.. 1 tablet by mouth two times a day 9)  Hydrochlorothiazide 25 Mg Tabs (Hydrochlorothiazide) ....  Take 1 tab by mouth every morning 10)  Naprosyn 500 Mg Tabs (Naproxen) .Marland Kitchen.. 1 two times a day po prn  Patient Instructions: 1)  Good to see you today. 2)  You have arthritis in your right knee.  Try using ice for 10-15 minutes at a time 2-3 times a day.  If you use heat for the pain please follow up with using ice afterwards.  Frozen peas or frozen carrots work great as ice packs. 3)  We have prescribed you Naproxen to use in the mornings and at night for the next month.  This will help with inflammation and the pain.   4)  If your knee pain continues please let us know in 2 weeks and we can look into other treatment options at that time including Physical Therapy. 5)  Continue to exercise and work on weight loss as both of these things will help your arthritis.    Prescriptions: NAPROSYN 500 MG TABS (NAPROXEN) 1 two times a day po prn  #60 x 0   Entered and Authorized by:   Helane Rima DO   Signed by:   Helane Rima DO on 06/21/2010   Method used:   [facility] to        [facility] [address] [phone]* (retail)       [address]       [place], [place]  [address]       Ph: [phone] or [phone]       Fax: [phone]   RxID:   [id number]

## 2010-11-08 NOTE — Progress Notes (Signed)
Summary: needs meds   Phone Note Call from Patient Call back at Home Phone [phone]   Caller: Patient Summary of Call: everyone in her household has strep and now she is getting a sore throat - [facility] to know [facility] Initial call taken by: De Nurse,  November 26, 2009 10:07 AM  Follow-up for Phone Call        states it just started yesterday. told her she must be seen for meds. work in at 3 to accomodate her schedule. knows there will be a wait Follow-up by: Golden Circle RN,  November 26, 2009 10:18 AM

## 2010-11-08 NOTE — Assessment & Plan Note (Signed)
Summary: f/u DM, HTN, obesity, right great toe pain   Vital Signs:  Patient profile:   60 year old female Height:      61.5 inches Weight:      220 pounds BMI:     41.04 BSA:     1.98 Temp:     98.1 degrees F Pulse rate:   54 / minute BP sitting:   135 / 80  Vitals Entered By: Jone Baseman CMA (September 22, 2009 10:29 AM)  Serial Vital Signs/Assessments:  Time      Position  BP       Pulse  Resp  Temp     By                     127/78                         Helane Rima DO  CC: f/u DM Is Patient Diabetic? Yes Did you bring your meter with you today? No Pain Assessment Patient in pain? no        Primary Care Provider:  Helane Rima DO  CC:  f/u DM.  History of Present Illness: 60 year old obese AAF with:  1. HTN: Rx: metoprolol, norvasc, HCTZ.  had cough with ACE and can't afford ARB. denies CP, SOB, N/V/D, LE edema, HA, dizziness. + MI in 2001. has cardiologist.    2. HLD: Rx: simvastatin 80mg . last FLP revealed LDL 78 (goal < 70).  3. DM2: does not check BG. she has lost 5 more pounds through diet and exercise, she has been avoiding foods with sugar and not eating after 7:30 pm, walking most days. she denies numbness/tingling in hands/feet, vision changes. last eye appt was 2 years ago, she can't afford to go at this time.   4. Obesity: continues to lose weight. has lost 5 pounds since last visit. walking daily  ~1 mile.   5. Right great toe: mild pain and swelling x 1 week. no drainage. thinks that she clipped her toenail the worng way. has not tried anything for treatment.   Habits & Providers  Alcohol-Tobacco-Diet     Tobacco Status: never  Current Medications (verified): 1)  Isosorbide Mononitrate Cr 30 Mg Tb24 (Isosorbide Mononitrate) .... Take 1 Tablet By Mouth Once A Day 2)  Metformin Hcl 1000 Mg Tabs (Metformin Hcl) .... Take 1 Tablet By Mouth Two Times A Day 3)  Metoprolol Tartrate 100 Mg Tabs (Metoprolol Tartrate) .... 1/2  Tablet By Mouth Two  Times A Day 4)  Norvasc 5 Mg Tabs (Amlodipine Besylate) .Marland Kitchen.. 1 Tablet By Mouth Daily For Blood Pressure 5)  Simvastatin 80 Mg Tabs (Simvastatin) .Marland Kitchen.. 1 By Mouth Daily For Cholesterol 6)  Bayer Aspirin 325 Mg Tabs (Aspirin) .Marland Kitchen.. 1 Tablet By Mouth Daily 7)  Calcium 600 1500 Mg Tabs (Calcium Carbonate) .Marland Kitchen.. 1 Tablet By Mouth Daily 8)  Fish Oil 1200 Mg Caps (Omega-3 Fatty Acids) .Marland Kitchen.. 1 Tablet By Mouth Two Times A Day 9)  Hydrochlorothiazide 25 Mg  Tabs (Hydrochlorothiazide) .... Take 1 Tab By Mouth Every Morning  Allergies (verified): 1)  ! Ace Inhibitors  Past History:  Past medical, surgical, family and social histories (including risk factors) reviewed, and no changes noted (except as noted below).  Past Medical History: Hx +RPR -- s/p treatment ABI = 1.08 - 12/12/2001 Hx inferior wall acute MI (09/2000) - Cardiologist is Dr. Daleen Squibb [facility])    - Angioplasty -  stent distal RCA - 09/21/2000    - Stress cardiolite: inferior wall scar, no stress ischemia, EF 55% - 10/09/2000    - Myoview: low risk with small inferior lateral wall defect (2/09)    - 2D echo (1/09): EF 60%, very mild aortic stenosis, mild mitral regurgitation Aortic Stenosis, Mild Sickle Cell Trait OA HLD HTN DMII, Uncomplicated  Past Surgical History: Angioplasty - 09/21/2000, stent distal RCA - 09/21/2000  Family History: Reviewed history from 12/06/2006 and no changes required. Daughter--sickle cell dz, Mom--HTN, DM2, Sister--DM2  Social History: Reviewed history from 09/01/2008 and no changes required. 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 CV:  Denies chest pain or discomfort, palpitations, and shortness of breath with exertion. Resp:  Denies cough. GI:  Denies change in bowel habits. Derm:  Complains of changes in nail beds  and lesion(s); denies poor wound healing. Neuro:  Denies headaches, numbness, and tingling. Endo:  Denies excessive thirst, excessive urination, and polyuria.  Physical Exam  General:  obese, pleasant. vitals reviewed. Lungs:  Normal respiratory effort, chest expands symmetrically. Lungs are clear to auscultation, no crackles or wheezes. Heart:  normal rate, irregular rhythm, and Grade 2 /6 systolic ejection murmur, loudest at LUSB.     Skin:  Right hallux with onychomycosis changes of nail, mild erythema lateral nail fold, slightly tender to palpation, no drainage, mild edema.   Impression & Recommendations:  Problem # 1:  DIABETES MELLITUS II, UNCOMPLICATED (ICD-250.00) Assessment Improved Doing well. A1c improving, patient already at goal. Needs eye screening but can't aford visit at this time.  Her updated medication list for this problem includes:    Metformin Hcl 1000 Mg Tabs (Metformin hcl) .Marland Kitchen... Take 1 tablet by mouth two times a day    Bayer Aspirin 325 Mg Tabs (Aspirin) .Marland Kitchen... 1 tablet by mouth daily  Orders: A1C-[facility] (78295) [facility]- Est  Level 4 (62130)  Problem # 2:  HYPERTENSION, BENIGN SYSTEMIC (ICD-401.1) Assessment: Unchanged  Slightly high today, but normal values at home. Will not adjust regimen. Will start ARB as soon as patient can afford it. Patient has cardiologist appt late December.  Her updated medication list for this problem includes:    Metoprolol Tartrate 100 Mg Tabs (Metoprolol tartrate) .Marland Kitchen... 1/2  tablet by mouth two times a day    Norvasc 5 Mg Tabs (Amlodipine besylate) .Marland Kitchen... 1 tablet by mouth daily for blood pressure    Hydrochlorothiazide 25 Mg Tabs (Hydrochlorothiazide) .Marland Kitchen... Take 1 tab by mouth every morning  Orders: [facility]- Est  Level 4 (86578)  Problem # 3:  OBESITY, NOS (ICD-278.00) Assessment: Improved  Congratulated her on weight loss efforts.   Orders: [facility]- Est  Level 4 (46962)  Problem # 4:  PARONYCHIA, RIGHT GREAT TOE  (XBM-841.32) Assessment: New  MILD. Conservative treatment: Warm water soaks with OTC triple Abx. Red Flags given.   Orders: [facility]- Est  Level 4 (44010)  Complete Medication List: 1)  Isosorbide Mononitrate Cr 30 Mg Tb24 (Isosorbide mononitrate) .... Take 1 tablet by mouth once a day 2)  Metformin Hcl 1000 Mg Tabs (Metformin hcl) .... Take 1 tablet by mouth two times a day 3)  Metoprolol Tartrate 100 Mg Tabs (Metoprolol tartrate) .... 1/2  tablet by mouth two times a day 4)  Norvasc 5 Mg Tabs (Amlodipine besylate) .Marland Kitchen.. 1 tablet by  mouth daily for blood pressure 5)  Simvastatin 80 Mg Tabs (Simvastatin) .Marland Kitchen.. 1 by mouth daily for cholesterol 6)  Bayer Aspirin 325 Mg Tabs (Aspirin) .Marland Kitchen.. 1 tablet by mouth daily 7)  Calcium 600 1500 Mg Tabs (Calcium carbonate) .Marland Kitchen.. 1 tablet by mouth daily 8)  Fish Oil 1200 Mg Caps (Omega-3 fatty acids) .Marland Kitchen.. 1 tablet by mouth two times a day 9)  Hydrochlorothiazide 25 Mg Tabs (Hydrochlorothiazide) .... Take 1 tab by mouth every morning  Patient Instructions: 1)  It was nice to see you today! 2)  Continue exercising and eating healthy foods. You have lost 5 pounds since your last visit in August. Keep up the good work! 3)  You are due to have your eyes checked. 4)  For your big toenail: soak your foot in warm soapy water for 20 minutes three times a day. Afterwards, put a triple antibiotic ointment on the area and push the skin away from the nail. If the infection does not go away in 1-2 weeks or gets worse, let me know.  5)  Your A1C is 6.4 today! GREAT WORK!!!! 6)  You are due for a mammogram. Prescriptions: HYDROCHLOROTHIAZIDE 25 MG  TABS (HYDROCHLOROTHIAZIDE) Take 1 tab by mouth every morning  #90 x 3   Entered and Authorized by:   Helane Rima DO   Signed by:   Helane Rima DO on 09/22/2009   Method used:   [facility]        [facility] [address] [phone]* (retail)       [address]       [place], [place]  [address]        Ph: [phone] or [phone]       Fax: [phone]   RxID:   [id number] METFORMIN HCL 1000 MG TABS (METFORMIN HCL) Take 1 tablet by mouth two times a day  #180 x 2   Entered and Authorized by:   Helane Rima DO   Signed by:   Helane Rima DO on 09/22/2009   Method used:   [facility] to        [facility] [address] [phone]* (retail)       [address]       [place], [place]  [address]       Ph: [phone] or [phone]       Fax: [phone]   RxID:   [id number] ISOSORBIDE MONONITRATE CR 30 MG TB24 (ISOSORBIDE MONONITRATE) Take 1 tablet by mouth once a day  #90 x 3   Entered and Authorized by:   Helane Rima DO   Signed by:   Helane Rima DO on 09/22/2009   Method used:   [facility] to        [facility] [address] [phone]* (retail)       [address]       [place], [place]  [address]       Ph: [phone] or [phone]       Fax: [phone]   RxID:   [id number] SIMVASTATIN 80 MG TABS (SIMVASTATIN) 1 by mouth daily for cholesterol  #90 x 3   Entered and Authorized by:   Helane Rima DO   Signed by:   Helane Rima DO on 09/22/2009   Method used:   Print then Give to Patient   RxID:   [id number] NORVASC 5 MG  TABS (AMLODIPINE BESYLATE) 1 tablet by mouth daily for blood pressure  #90 x 3   Entered and Authorized by:   Helane Rima DO   Signed by:   Helane Rima DO on 09/22/2009   Method used:   Print then Give to Patient   RxID:   [id number]   Prevention & Chronic Care Immunizations   Influenza vaccine: Fluvax Non-MCR  (07/15/2008)   Influenza vaccine due: 08/05/2008    Tetanus booster: 09/08/2002: Done.   Tetanus booster due: 09/08/2012    Pneumococcal vaccine: Not documented  Colorectal Screening   Hemoccult: Done.  (03/09/2005)   Hemoccult due: Not Indicated    Colonoscopy: Results: Diverticulosis.       Location:  [facility].     (10/05/2008)   Colonoscopy due:  10/05/2018  Other Screening   Pap smear: NEGATIVE FOR INTRAEPITHELIAL LESIONS OR MALIGNANCY.  (09/01/2008)   Pap smear due: 11/09/2006    Mammogram: ASSESSMENT: Negative - BI-RADS 1^MM DIGITAL SCREENING  (09/02/2008)   Mammogram due: 01/2008   Smoking status: never  (09/22/2009)  Diabetes Mellitus   HgbA1C: 6.4  (09/22/2009)   Hemoglobin A1C due: 11/06/2007    Eye exam: Not documented   Diabetic eye exam action/deferral: Ophthalmology referral  (05/31/2009)    Foot exam: yes  (05/31/2009)   Foot exam action/deferral: Do today   High risk foot: Not documented   Foot care education: Done  (05/31/2009)   Foot exam due: 08/05/2008    Urine microalbumin/creatinine ratio: Not documented    Diabetes flowsheet reviewed?: Yes   Progress toward A1C goal: At goal  Lipids   Total Cholesterol: 121  (01/29/2009)   LDL: 64  (01/29/2009)   LDL Direct: Not documented   HDL: 39  (01/29/2009)   Triglycerides: 90  (01/29/2009)    SGOT (AST): 17  (01/29/2009)   SGPT (ALT): 19  (01/29/2009)   Alkaline phosphatase: 67  (01/29/2009)   Total bilirubin: 0.3  (01/29/2009)    Lipid flowsheet reviewed?: Yes   Progress toward LDL goal: At goal  Hypertension   Last Blood Pressure: 135 / 80  (09/22/2009)   Serum creatinine: 0.88  (01/29/2009)   Serum potassium 4.5  (01/29/2009)    Hypertension flowsheet reviewed?: Yes   Progress toward BP goal: Deteriorated  Self-Management Support :   Personal Goals (by the next clinic visit) :     Personal A1C goal: 7  (05/31/2009)     Personal blood pressure goal: 130/80  (05/31/2009)     Personal LDL goal: 100  (09/22/2009)    Patient will work on the following items until the next clinic visit to reach self-care goals:     Medications and monitoring: take my medicines every day  (09/22/2009)     Eating: drink diet soda or water instead of juice or soda, eat more vegetables, use fresh or frozen vegetables, eat foods that are low in salt, eat baked  foods instead of fried foods, eat fruit for snacks and desserts, limit or avoid alcohol  (09/22/2009)     Activity: take a 30 minute walk every day, take the stairs instead of the elevator, park at the far end of the parking lot  (09/22/2009)    Diabetes self-management support: Written self-care plan  (09/22/2009)   Diabetes care plan printed    Hypertension self-management support: Written self-care plan  (09/22/2009)   Hypertension self-care plan printed.    Lipid self-management support: Written self-care plan  (09/22/2009)   Lipid self-care plan printed.  Nursing Instructions: Give Flu vaccine today    Laboratory Results   Blood Tests   Date/Time Received: September 22, 2009 10:28 AM  Date/Time Reported: September 22, 2009 10:54 AM   HGBA1C: 6.4%   (Normal Range: Non-Diabetic - 3-6%   Control Diabetic - 6-8%)  Comments: ...........test performed by...........Marland KitchenTerese Door, CMA      Appended Document: f/u DM, HTN, obesity, right great toe pain CC: DM, HTN, OBESITY, RIGHT GREAT TOE PAIN

## 2010-11-08 NOTE — Progress Notes (Signed)
Summary: pt needs OV before next RF/TS   Phone Note Refill Request Call back at Home Phone [phone] Message from:  Patient  Refills Requested: Medication #1:  SIMVASTATIN 80 MG TABS 1 by mouth daily for cholesterol PT USES [facility] FOR THIS RX AND IT SHOULD BE FOR 90 DAYS.  Initial call taken by: Clydell Hakim,  Mar 01, 2010 4:21 PM  Follow-up for Phone Call        please fax Rx to [facility]. please inform the patient that she is due for a visit. Follow-up by: Helane Rima DO,  Mar 03, 2010 9:29 AM    Prescriptions: SIMVASTATIN 80 MG TABS (SIMVASTATIN) 1 by mouth daily for cholesterol  #90 x 0   Entered by:   Arlyss Repress CMA,   Authorized by:   Helane Rima DO   Signed by:   Arlyss Repress CMA, on 03/03/2010   Method used:   Historical   RxID:   [id number] SIMVASTATIN 80 MG TABS (SIMVASTATIN) 1 by mouth daily for cholesterol  #0 x 0   Entered and Authorized by:   Helane Rima DO   Signed by:   Helane Rima DO on 03/03/2010   Method used:   Historical   RxID:   [id number]  called pt. no answer.Arlyss Repress CMA,  Mar 03, 2010 10:44 AM

## 2010-11-08 NOTE — Miscellaneous (Signed)
Summary: wants celebrex   Clinical Lists Changes state her arthritis is bad in both knees. says tylenol is not working enough & wants to go to celebrex, which she had before for one of her knees. states she was told a while ago not to take NSAIDs. told her I will ask md. [facility] [address]...Marland KitchenMarland KitchenGolden Circle RN  June 10, 2010 3:21 PM  Please ask the patient to make an appointment to discuss this issue. Helane Rima DO  June 14, 2010 11:00 AM    called pt asked her to call in the morning and schedule appt with Dr Earlene Plater to discuss knee pain and starting new meds.Tessie Fass CMA  June 14, 2010 5:42 PM

## 2010-11-08 NOTE — Miscellaneous (Signed)
Summary: Orders Update   Clinical Lists Changes  Problems: Added new problem of ENCOUNTER FOR LONG-TERM USE OF OTHER MEDICATIONS (ICD-V58.69) Orders: Added new Test order of CBC-[facility] (75643) - Signed Added new Test order of B12-[facility] (32951-88416) - Signed OK per Dr. Earlene Plater

## 2010-11-08 NOTE — Progress Notes (Signed)
Summary: Rx Req   Phone Note Refill Request Call back at Home Phone [phone] Message from:  Patient  Refills Requested: Medication #1:  NORVASC 5 MG TABS 1 tablet by mouth daily for blood pressure PT USES [facility].  Initial call taken by: Clydell Hakim,  August 19, 2009 10:29 AM    Prescriptions: NORVASC 5 MG TABS (AMLODIPINE BESYLATE) 1 tablet by mouth daily for blood pressure  #90 x 1   Entered and Authorized by:   Helane Rima DO   Signed by:   Helane Rima DO on 08/20/2009   Method used:   [facility]ronically to        [facility] [address] [phone]* (retail)       [address]       [place], [place]  [address]       Ph: [phone] or [phone]       Fax: [phone]   RxID:   [id number]   Appended Document: Rx Req [facility] not electronic option. Sent to previous pharmacy.

## 2010-11-08 NOTE — Miscellaneous (Signed)
Summary: re: RX/please read message to pt/TS  unable to reach pt. did sent [facility] to [facility], as it was listed in her documents. Pt requested [facility], but did not indicate a Street name.  Pt also needs OV before next refill.Arlyss Repress CMA,  Mar 03, 2010 11:57 AM  Clinical Lists Changes

## 2010-11-08 NOTE — Assessment & Plan Note (Signed)
Summary: f/up,tcb   Vital Signs:  Patient profile:   60 year old female Height:      61.5 inches Weight:      219 pounds BMI:     40.86 Pulse rate:   58 / minute BP sitting:   133 / 84  (right arm) Cuff size:   large  Vitals Entered By: Arlyss Repress CMA, (May 02, 2010 9:43 AM) CC: f/up DM and HTN. discuss metoprolol. fill out forms for work Is Patient Diabetic? Yes Pain Assessment Patient in pain? no        Primary Care Provider:  Helane Rima DO  CC:  f/up DM and HTN. discuss metoprolol. fill out forms for work.  History of Present Illness: 60 yo F:  1. HTN: Rx: metoprolol, norvasc, HCTZ.  had cough with ACE and can't afford ARB. denies CP, SOB, N/V/D, LE edema, HA, dizziness. + MI in 2001. has cardiologist.    2. HLD: Rx: simvastatin 80mg . last FLP revealed LDL 78 (goal < 70). due for recheck.  3. DM2: Rx metformin. she denies numbness/tingling in hands/feet, vision changes. last eye appt was 2 years ago, she can't afford to go at this time. does not check BG as monitor broke.  4. Obesity: not exercising due to heat.  Habits & Providers  Alcohol-Tobacco-Diet     Tobacco Status: quit > 6 months     Year Quit: 2001  Current Medications (verified): 1)  Isosorbide Mononitrate Cr 30 Mg Tb24 (Isosorbide Mononitrate) .... Take 1 Tablet By Mouth Once A Day 2)  Metformin Hcl 1000 Mg Tabs (Metformin Hcl) .... Take 1 Tablet By Mouth Two Times A Day 3)  Metoprolol Tartrate 50 Mg Tabs (Metoprolol Tartrate) .... Take 1 Tab By Mouth Two Times Per Day 4)  Norvasc 5 Mg Tabs (Amlodipine Besylate) .Marland Kitchen.. 1 Tablet By Mouth Daily For Blood Pressure 5)  Simvastatin 80 Mg Tabs (Simvastatin) .Marland Kitchen.. 1 By Mouth Daily For Cholesterol 6)  Bayer Aspirin 325 Mg Tabs (Aspirin) .Marland Kitchen.. 1 Tablet By Mouth Daily 7)  Calcium 600 1500 Mg Tabs (Calcium Carbonate) .Marland Kitchen.. 1 Tablet By Mouth Daily 8)  Fish Oil 1200 Mg Caps (Omega-3 Fatty Acids) .Marland Kitchen.. 1 Tablet By Mouth Two Times A Day 9)  Hydrochlorothiazide 25  Mg  Tabs (Hydrochlorothiazide) .... Take 1 Tab By Mouth Every Morning  Allergies (verified): 1)  ! Ace Inhibitors PMH-FH-SH reviewed for relevance  Social History: Smoking Status:  quit > 6 months  Review of Systems      See HPI  Physical Exam  General:  Obese, pleasant. Vitals reviewed. Lungs:  CTAB Heart:  RRR 3/6 SEM at LSB Extremities:  No edema. Psych:  Oriented X3, normally interactive, and good eye contact.    Diabetes Management Exam:    Foot Exam (with socks and/or shoes not present):       Sensory-Pinprick/Light touch:          Left medial foot (L-4): normal          Left dorsal foot (L-5): normal          Left lateral foot (S-1): normal          Right medial foot (L-4): normal          Right dorsal foot (L-5): normal          Right lateral foot (S-1): normal       Sensory-Monofilament:          Left foot: normal  Right foot: normal       Inspection:          Left foot: normal          Right foot: normal       Nails:          Left foot: normal          Right foot: normal   Impression & Recommendations:  Problem # 1:  HYPERTENSION, BENIGN SYSTEMIC (ICD-401.1) Assessment Unchanged  Continue current regimen. Her updataed medication list for this problem includes:    Metoprolol Tartrate 50 Mg Tabs (Metoprolol tartrate) .Marland Kitchen... Take 1 tab by mouth two times per day    Norvasc 5 Mg Tabs (Amlodipine besylate) .Marland Kitchen... 1 tablet by mouth daily for blood pressure    Hydrochlorothiazide 25 Mg Tabs (Hydrochlorothiazide) .Marland Kitchen... Take 1 tab by mouth every morning  Orders: [facility]- Est  Level 4 (16109)  Problem # 2:  HYPERLIPIDEMIA, CONTROLLED (ICD-272.4) Assessment: Unchanged  Continue current regimen. FLP at next visit. Her updated medication list for this problem includes:    Simvastatin 80 Mg Tabs (Simvastatin) .Marland Kitchen... 1 by mouth daily for cholesterol  Orders: [facility]- Est  Level 4 (99214)  Problem # 3:  DIABETES MELLITUS II, UNCOMPLICATED  (ICD-250.00) Assessment: Unchanged Continue current regimen. Her updated medication list for this problem includes:    Metformin Hcl 1000 Mg Tabs (Metformin hcl) .Marland Kitchen... Take 1 tablet by mouth two times a day    Bayer Aspirin 325 Mg Tabs (Aspirin) .Marland Kitchen... 1 tablet by mouth daily  Orders: A1C-[facility] (60454) [facility]- Est  Level 4 (09811)  Problem # 4:  OBESITY, NOS (ICD-278.00) Assessment: Unchanged  Encourage restarting exercise program.  Orders: [facility]- Est  Level 4 (91478)  Complete Medication List: 1)  Isosorbide Mononitrate Cr 30 Mg Tb24 (Isosorbide mononitrate) .... Take 1 tablet by mouth once a day 2)  Metformin Hcl 1000 Mg Tabs (Metformin hcl) .... Take 1 tablet by mouth two times a day 3)  Metoprolol Tartrate 50 Mg Tabs (Metoprolol tartrate) .... Take 1 tab by mouth two times per day 4)  Norvasc 5 Mg Tabs (Amlodipine besylate) .Marland Kitchen.. 1 tablet by mouth daily for blood pressure 5)  Simvastatin 80 Mg Tabs (Simvastatin) .Marland Kitchen.. 1 by mouth daily for cholesterol 6)  Bayer Aspirin 325 Mg Tabs (Aspirin) .Marland Kitchen.. 1 tablet by mouth daily 7)  Calcium 600 1500 Mg Tabs (Calcium carbonate) .Marland Kitchen.. 1 tablet by mouth daily 8)  Fish Oil 1200 Mg Caps (Omega-3 fatty acids) .Marland Kitchen.. 1 tablet by mouth two times a day 9)  Hydrochlorothiazide 25 Mg Tabs (Hydrochlorothiazide) .... Take 1 tab by mouth every morning  Other Orders: TB Skin Test [phone]) Admin 1st Vaccine (13086) Admin 1st Vaccine [facility]) [phone])  Patient Instructions: 1)  It was nice to see you today! 2)  Follow up in 6 months.   PPD Application    Vaccine Type: PPD    Site: left forearm    Mfr: Sanofi Pasteur    Dose: 0.1 ml    Route: ID    Given by: Arlyss Repress CMA,    Exp. Date: 07/22/2011    Lot #: [id number]   Laboratory Results   Blood Tests   Date/Time Received: May 02, 2010 9:46 AM  Date/Time Reported: May 02, 2010 10:10 AM   HGBA1C: 6.5%   (Normal Range: Non-Diabetic - 3-6%   Control Diabetic - 6-8%)  Comments: ...........test  performed by...........Marland KitchenTerese Door, CMA     Prevention & Chronic Care Immunizations  Influenza vaccine: Fluvax Non-MCR  (07/15/2008)   Influenza vaccine deferral: Not available  (05/02/2010)   Influenza vaccine due: 08/05/2008    Tetanus booster: 09/08/2002: Done.   Tetanus booster due: 09/08/2012    Pneumococcal vaccine: Not documented  Colorectal Screening   Hemoccult: Done.  (03/09/2005)   Hemoccult due: Not Indicated    Colonoscopy: Results: Diverticulosis.       Location:  [facility].     (10/05/2008)   Colonoscopy due: 10/05/2018  Other Screening   Pap smear: NEGATIVE FOR INTRAEPITHELIAL LESIONS OR MALIGNANCY.  (09/01/2008)   Pap smear action/deferral: Deferred-3 yr interval  (05/02/2010)   Pap smear due: 11/09/2006    Mammogram: ASSESSMENT: Negative - BI-RADS 1^MM DIGITAL SCREENING  (09/02/2008)   Mammogram due: 01/2008   Smoking status: quit > 6 months  (05/02/2010)  Diabetes Mellitus   HgbA1C: 6.5  (05/02/2010)   Hemoglobin A1C due: 11/06/2007    Eye exam: Not documented   Diabetic eye exam action/deferral: Ophthalmology referral  (05/31/2009)    Foot exam: yes  (05/02/2010)   Foot exam action/deferral: Do today   High risk foot: Not documented   Foot care education: Done  (05/31/2009)   Foot exam due: 08/05/2008    Urine microalbumin/creatinine ratio: Not documented   Urine microalbumin action/deferral: Not indicated    Diabetes flowsheet reviewed?: Yes   Progress toward A1C goal: At goal  Lipids   Total Cholesterol: 121  (01/29/2009)   LDL: 64  (01/29/2009)   LDL Direct: Not documented   HDL: 39  (01/29/2009)   Triglycerides: 90  (01/29/2009)    SGOT (AST): 17  (01/29/2009)   SGPT (ALT): 19  (01/29/2009)   Alkaline phosphatase: 67  (01/29/2009)   Total bilirubin: 0.3  (01/29/2009)    Lipid flowsheet reviewed?: Yes   Progress toward LDL goal: At goal  Hypertension   Last Blood Pressure: 133 / 84  (05/02/2010)   Serum  creatinine: 0.73  (09/22/2009)   Serum potassium 4.2  (09/22/2009)    Hypertension flowsheet reviewed?: Yes   Progress toward BP goal: At goal  Self-Management Support :   Personal Goals (by the next clinic visit) :     Personal A1C goal: 7  (05/31/2009)     Personal blood pressure goal: 130/80  (05/31/2009)     Personal LDL goal: 100  (09/22/2009)    Patient will work on the following items until the next clinic visit to reach self-care goals:     Medications and monitoring: take my medicines every day, bring all of my medications to every visit  (05/02/2010)     Eating: drink diet soda or water instead of juice or soda, eat more vegetables, use fresh or frozen vegetables, eat foods that are low in salt, eat baked foods instead of fried foods, eat fruit for snacks and desserts, limit or avoid alcohol  (05/02/2010)     Activity: take a 30 minute walk every day, take the stairs instead of the elevator, park at the far end of the parking lot  (05/02/2010)    Diabetes self-management support: Written self-care plan  (05/02/2010)   Diabetes care plan printed    Hypertension self-management support: Written self-care plan  (05/02/2010)   Hypertension self-care plan printed.    Lipid self-management support: Written self-care plan  (05/02/2010)   Lipid self-care plan printed.

## 2010-11-10 NOTE — Assessment & Plan Note (Signed)
Summary: cpe/pap,df   Vital Signs:  Patient profile:   60 year old female Height:      61.5 inches Weight:      219.6 pounds BMI:     40.97 Temp:     98.8 degrees F oral Pulse rate:   75 / minute BP sitting:   146 / 84  (right arm) Cuff size:   large  Vitals Entered By: Garen Grams LPN (October 04, 2010 9:29 AM)  CC: CPP Is Patient Diabetic? Yes Did you bring your meter with you today? No Pain Assessment Patient in pain? yes     Location: legs   Primary Care Provider:  Helane Rima DO  CC:  CPP.  History of Present Illness: 60 yo F:  1. HTN: Rx: metoprolol, norvasc, HCTZ.  had cough with ACE and can't afford ARB. denies CP, SOB, N/V/D, LE edema, HA, dizziness. + MI in 2001. has cardiologist.    2. HLD: Rx: simvastatin 80mg . last FLP revealed LDL at goal. due for recheck. patient endorses myalgias with this dose - was off x 1 week and no leg pain.   3. DM2: controlled. Rx metformin. she denies numbness/tingling in hands/feet, vision changes. last eye appt was 3 years ago, she can't afford to go at this time.   4. Obesity: not exercising regularly.  Habits & Providers  Alcohol-Tobacco-Diet     Tobacco Status: quit > 6 months     Year Quit: 2001  Current Medications (verified): 1)  Isosorbide Mononitrate Cr 30 Mg Tb24 (Isosorbide Mononitrate) .... Take 1 Tablet By Mouth Once A Day 2)  Metformin Hcl 1000 Mg Tabs (Metformin Hcl) .... Take 1 Tablet By Mouth Two Times A Day 3)  Metoprolol Tartrate 50 Mg Tabs (Metoprolol Tartrate) .... Take 1 Tab By Mouth Two Times Per Day 4)  Norvasc 5 Mg Tabs (Amlodipine Besylate) .Marland Kitchen.. 1 Tablet By Mouth Daily For Blood Pressure 5)  Simvastatin 80 Mg Tabs (Simvastatin) .... 1/2 By Mouth Daily For Cholesterol 6)  Bayer Aspirin 325 Mg Tabs (Aspirin) .Marland Kitchen.. 1 Tablet By Mouth Daily 7)  Calcium 600 1500 Mg Tabs (Calcium Carbonate) .Marland Kitchen.. 1 Tablet By Mouth Daily 8)  Fish Oil 1200 Mg Caps (Omega-3 Fatty Acids) .Marland Kitchen.. 1 Tablet By Mouth Two Times A  Day 9)  Hydrochlorothiazide 25 Mg  Tabs (Hydrochlorothiazide) .... Take 1 Tab By Mouth Every Morning 10)  Naprosyn 500 Mg Tabs (Naproxen) .Marland Kitchen.. 1 Two Times A Day Po Prn  Allergies (verified): 1)  ! Ace Inhibitors PMH-FH-SH reviewed for relevance  Review of Systems      See HPI  Physical Exam  General:  Obese, pleasant. Vitals reviewed. Eyes:  No corneal or conjunctival inflammation noted. EOMI. PERRL. Ears:  R ear normal and L ear normal.   Nose:  External nasal examination shows no deformity or inflammation. Nasal mucosa are pink and moist without lesions or exudates. Mouth:  Oral mucosa and oropharynx without lesions or exudates.  Neck:  No deformities, masses, or tenderness noted. Lungs:  CTAB. Heart:  RRR 3/6 SEM at LSB. Abdomen:  Bowel sounds positive,abdomen soft and non-tender without masses, organomegaly or hernias noted. Pulses:  2+ DP. Extremities:  No edema. Neurologic:  Sensation intact to light touch and DTRs symmetrical and normal.   Skin:  Intact without suspicious lesions or rashes. Psych:  Oriented X3, normally interactive, and good eye contact.     Impression & Recommendations:  Problem # 1:  Preventive Health Care (ICD-V70.0)  Problem # 2:  HYPERTENSION, BENIGN SYSTEMIC (ICD-401.1) Assessment: Unchanged Patient did not take medication today. Will have her check BP at home x 2 weeks and send results to me. Hx of bradycardia with increase in BB. Her updated medication list for this problem includes:    Metoprolol Tartrate 50 Mg Tabs (Metoprolol tartrate) .Marland Kitchen... Take 1 tab by mouth two times per day    Norvasc 5 Mg Tabs (Amlodipine besylate) .Marland Kitchen... 1 tablet by mouth daily for blood pressure    Hydrochlorothiazide 25 Mg Tabs (Hydrochlorothiazide) .Marland Kitchen... Take 1 tab by mouth every morning  Orders: Comp Met-[facility] ([id number]) [facility] - Est  40-64 yrs (14782)  Problem # 3:  DIABETES MELLITUS II, UNCOMPLICATED (ICD-250.00) Assessment: Unchanged Controlled. Continue  current treatment. Her updated medication list for this problem includes:    Metformin Hcl 1000 Mg Tabs (Metformin hcl) .Marland Kitchen... Take 1 tablet by mouth two times a day    Bayer Aspirin 325 Mg Tabs (Aspirin) .Marland Kitchen... 1 tablet by mouth daily  Orders: A1C-[facility] (95621) [facility] - Est  40-64 yrs (30865)  Problem # 4:  HYPERLIPIDEMIA, CONTROLLED (ICD-272.4) Assessment: Unchanged Recheck LDL. Decrease statin to 40 mg by mouth daily. Her updated medication list for this problem includes:    Simvastatin 80 Mg Tabs (Simvastatin) .Marland Kitchen... 1/2 by mouth daily for cholesterol  Orders: Lipid-[facility] ([id number]) [facility] - Est  40-64 yrs (84132)  Complete Medication List: 1)  Isosorbide Mononitrate Cr 30 Mg Tb24 (Isosorbide mononitrate) .... Take 1 tablet by mouth once a day 2)  Metformin Hcl 1000 Mg Tabs (Metformin hcl) .... Take 1 tablet by mouth two times a day 3)  Metoprolol Tartrate 50 Mg Tabs (Metoprolol tartrate) .... Take 1 tab by mouth two times per day 4)  Norvasc 5 Mg Tabs (Amlodipine besylate) .Marland Kitchen.. 1 tablet by mouth daily for blood pressure 5)  Simvastatin 80 Mg Tabs (Simvastatin) .... 1/2 by mouth daily for cholesterol 6)  Bayer Aspirin 325 Mg Tabs (Aspirin) .Marland Kitchen.. 1 tablet by mouth daily 7)  Calcium 600 1500 Mg Tabs (Calcium carbonate) .Marland Kitchen.. 1 tablet by mouth daily 8)  Fish Oil 1200 Mg Caps (Omega-3 fatty acids) .Marland Kitchen.. 1 tablet by mouth two times a day 9)  Hydrochlorothiazide 25 Mg Tabs (Hydrochlorothiazide) .... Take 1 tab by mouth every morning 10)  Naprosyn 500 Mg Tabs (Naproxen) .Marland Kitchen.. 1 two times a day po prn  Other Orders: CK (Creatine Kinase)-[facility] [phone])  Patient Instructions: 1)  It was nice to see you today! 2)  We are checking labs today. 3)  Decrease the Simvastatin to 40 mg by mouth daily which is 1/2 pill. Prescriptions: NAPROSYN 500 MG TABS (NAPROXEN) 1 two times a day po prn  #60 x 0   Entered and Authorized by:   Helane Rima DO   Signed by:   Helane Rima DO on 10/04/2010   Method  used:   Print then Give to Patient   RxID:   [id number]    Orders Added: 1)  A1C-[facility] [83036] 2)  CK (Creatine Kinase)-[facility] [82550-23250] 3)  Lipid-[facility] [80061-22930] 4)  Comp Met-[facility] [75643-32951] 5)  [facility] - Est  40-64 yrs [99396]    Prevention & Chronic Care Immunizations   Influenza vaccine: Fluvax Non-MCR  (07/15/2008)   Influenza vaccine deferral: Not available  (05/02/2010)   Influenza vaccine due: 08/05/2008    Tetanus booster: 09/08/2002: Done.   Tetanus booster due: 09/08/2012    Pneumococcal vaccine: Not documented  Colorectal Screening   Hemoccult: Done.  (03/09/2005)   Hemoccult due: Not Indicated  Colonoscopy: Results: Diverticulosis.       Location:  [facility].     (10/05/2008)   Colonoscopy due: 10/05/2018  Other Screening   Pap smear: NEGATIVE FOR INTRAEPITHELIAL LESIONS OR MALIGNANCY.  (09/01/2008)   Pap smear action/deferral: Deferred-3 yr interval  (05/02/2010)   Pap smear due: 11/09/2006    Mammogram: ASSESSMENT: Negative - BI-RADS 1^MM DIGITAL SCREENING  (08/31/2010)   Mammogram due: 01/2008   Smoking status: quit > 6 months  (10/04/2010)  Diabetes Mellitus   HgbA1C: 6.2  (10/04/2010)   Hemoglobin A1C due: 11/06/2007    Eye exam: Not documented   Diabetic eye exam action/deferral: Ophthalmology referral  (05/31/2009)    Foot exam: yes  (05/02/2010)   Foot exam action/deferral: Do today   High risk foot: Not documented   Foot care education: Done  (05/31/2009)   Foot exam due: 08/05/2008    Urine microalbumin/creatinine ratio: Not documented   Urine microalbumin action/deferral: Not indicated  Lipids   Total Cholesterol: 121  (01/29/2009)   Lipid panel action/deferral: Lipid Panel ordered   LDL: 64  (01/29/2009)   LDL Direct: Not documented   HDL: 39  (01/29/2009)   Triglycerides: 90  (01/29/2009)    SGOT (AST): 17  (01/29/2009)   BMP action: Ordered   SGPT (ALT): 19  (01/29/2009) CMP ordered    Alkaline  phosphatase: 67  (01/29/2009)   Total bilirubin: 0.3  (01/29/2009)    Lipid flowsheet reviewed?: Yes   Progress toward LDL goal: At goal  Hypertension   Last Blood Pressure: 146 / 84  (10/04/2010)   Serum creatinine: 0.73  (09/22/2009)   BMP action: Ordered   Serum potassium 4.2  (09/22/2009) CMP ordered     Hypertension flowsheet reviewed?: Yes   Progress toward BP goal: Improved  Self-Management Support :   Personal Goals (by the next clinic visit) :     Personal A1C goal: 7  (05/31/2009)     Personal blood pressure goal: 130/80  (05/31/2009)     Personal LDL goal: 100  (09/22/2009)    Patient will work on the following items until the next clinic visit to reach self-care goals:     Medications and monitoring: take my medicines every day, bring all of my medications to every visit  (10/04/2010)     Eating: drink diet soda or water instead of juice or soda, eat more vegetables, use fresh or frozen vegetables, eat foods that are low in salt, eat baked foods instead of fried foods, eat fruit for snacks and desserts, limit or avoid alcohol  (10/04/2010)     Activity: take a 30 minute walk every day, take the stairs instead of the elevator, park at the far end of the parking lot  (10/04/2010)    Diabetes self-management support: Written self-care plan  (10/04/2010)   Diabetes care plan printed    Hypertension self-management support: Written self-care plan  (10/04/2010)   Hypertension self-care plan printed.    Lipid self-management support: Written self-care plan  (10/04/2010)   Lipid self-care plan printed.   Laboratory Results   Blood Tests   Date/Time Received: October 04, 2010 9:16 AM  Date/Time Reported: October 04, 2010 10:51 AM   HGBA1C: 6.2%   (Normal Range: Non-Diabetic - 3-6%   Control Diabetic - 6-8%)  Comments: ...........test performed by...........Marland KitchenTerese Door, CMA

## 2010-11-15 ENCOUNTER — Ambulatory Visit (INDEPENDENT_AMBULATORY_CARE_PROVIDER_SITE_OTHER): Payer: Self-pay | Admitting: Cardiology

## 2010-11-15 ENCOUNTER — Encounter: Payer: Self-pay | Admitting: Cardiology

## 2010-11-15 DIAGNOSIS — E669 Obesity, unspecified: Secondary | ICD-10-CM

## 2010-11-15 DIAGNOSIS — I2510 Atherosclerotic heart disease of native coronary artery without angina pectoris: Secondary | ICD-10-CM

## 2010-11-15 DIAGNOSIS — E785 Hyperlipidemia, unspecified: Secondary | ICD-10-CM

## 2010-11-24 NOTE — Assessment & Plan Note (Signed)
Summary: F2y -gd  Medications Added LIPITOR 80 MG TABS (ATORVASTATIN CALCIUM) take 1 tablet at bedtime      Allergies Added:   Visit Type:  13yr follow up Primary Provider:  Helane Rima DO  CC:  when lays down at night and she feels flutters/quivers. pt states it goes away if she gets up and walks.  pt has no other complaints today.  History of Present Illness: Ms Aday returns today for evaluation and management of her coronary artery disease and history of anterior Aram Domzalski infarct.  She has lost some weight and has begun a walking program. She denies any angina or ischemic symptoms.  She did have some muscle aches on simvastatin 80. This was decreased to 40 mg. I note she is also on amlodipine.  Her LFTs were within normal limits but her lipid panel was not at goal. Total cholesterol is 176, triglycerides 95, ratio 52, LDL 105. Her total cholesterol to HDL ratio was favorable at 3.4. This was on 40 mg of simvastatin. Her hemoglobin A1c was 6.2%. Fasting blood sugar was normal. Her creatinine is normal.  EKG today shows sinus rhythm with left atrial enlargement with an old inferior Shakeela Rabadan infarct pattern which is stable.    Problems Prior to Update: 1)  Health Screening  (ICD-V70.0) 2)  Leg Pain, Bilateral  (ICD-729.5) 3)  Encounter For Long-term Use of Other Medications  (ICD-V58.69) 4)  Degenerative Joint Disease  (ICD-715.90) 5)  Screening Examination For Pulmonary Tuberculosis  (ICD-V74.1) 6)  Streptococcal Pharyngitis  (ICD-034.0) 7)  Sore Throat  (ICD-462) 8)  Paronychia, Right Great Toe  (ICD-681.11) 9)  Hyperlipidemia, Controlled  (ICD-272.4) 10)  Diverticulosis, Colon  (ICD-562.10) 11)  Aortic Stenosis, Mild  (ICD-424.1) 12)  Sickle Cell Trait  (ICD-282.5) 13)  Osteoarthritis, Lower Leg  (ICD-715.96) 14)  Obesity, Nos  (ICD-278.00) 15)  Hypertension, Benign Systemic  (ICD-401.1) 16)  Diabetes Mellitus II, Uncomplicated  (ICD-250.00) 17)  Coronary, Arteriosclerosis   (ICD-414.00)  Current Medications (verified): 1)  Isosorbide Mononitrate Cr 30 Mg Tb24 (Isosorbide Mononitrate) .... Take 1 Tablet By Mouth Once A Day 2)  Metformin Hcl 1000 Mg Tabs (Metformin Hcl) .... Take 1 Tablet By Mouth Two Times A Day 3)  Metoprolol Tartrate 50 Mg Tabs (Metoprolol Tartrate) .... Take 1 Tab By Mouth Two Times Per Day 4)  Norvasc 5 Mg Tabs (Amlodipine Besylate) .Marland Kitchen.. 1 Tablet By Mouth Daily For Blood Pressure 5)  Simvastatin 80 Mg Tabs (Simvastatin) .... 1/2 By Mouth Daily For Cholesterol 6)  Bayer Aspirin 325 Mg Tabs (Aspirin) .Marland Kitchen.. 1 Tablet By Mouth Daily 7)  Calcium 600 1500 Mg Tabs (Calcium Carbonate) .Marland Kitchen.. 1 Tablet By Mouth Daily 8)  Fish Oil 1200 Mg Caps (Omega-3 Fatty Acids) .Marland Kitchen.. 1 Tablet By Mouth Two Times A Day 9)  Hydrochlorothiazide 25 Mg  Tabs (Hydrochlorothiazide) .... Take 1 Tab By Mouth Every Morning 10)  Naprosyn 500 Mg Tabs (Naproxen) .Marland Kitchen.. 1 Two Times A Day Po Prn  Allergies (verified): 1)  ! Ace Inhibitors  Past History:  Past Medical History: Last updated: 09/22/2009 Hx +RPR -- s/p treatment ABI = 1.08 - 12/12/2001 Hx inferior Damere Brandenburg acute MI (09/2000) - Cardiologist is Dr. Daleen Squibb [facility])    - Angioplasty - stent distal RCA - 09/21/2000    - Stress cardiolite: inferior Kamala Kolton scar, no stress ischemia, EF 55% - 10/09/2000    - Myoview: low risk with small inferior lateral Jandy Brackens defect (2/09)    - 2D echo (1/09): EF 60%, very  mild aortic stenosis, mild mitral regurgitation Aortic Stenosis, Mild Sickle Cell Trait OA HLD HTN DMII, Uncomplicated  Past Surgical History: Last updated: 09/22/2009 Angioplasty - 09/21/2000, stent distal RCA - 09/21/2000  Family History: Last updated: 12/06/2006 Daughter--sickle cell dz, Mom--HTN, DM2, Sister--DM2  Social History: Last updated: 09/01/2008 Lives with husband Molly Maduro), adult daughter & infant ;  granddaughter; 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  Risk  Factors: Smoking Status: quit > 6 months (10/04/2010)  Review of Systems       negative other than history of present illness  Vital Signs:  Patient profile:   60 year old female Height:      61.5 inches Weight:      224 pounds BMI:     41.79 Pulse rate:   61 / minute Resp:     18 per minute BP sitting:   138 / 84  (left arm) Cuff size:   large  Vitals Entered By: Celestia Khat, CMA (November 15, 2010 9:40 AM)  Physical Exam  General:  obese.  acute distress Head:  normocephalic and atraumatic Eyes:  PERRLA/EOM intact; conjunctiva and lids normal. Neck:  Neck supple, no JVD. No masses, thyromegaly or abnormal cervical nodes. Chest Killian Ress:  no deformities or breast masses noted Lungs:  Clear bilaterally to auscultation and percussion. Heart:  PMI nondisplaced, regular rate and rhythm, normal S1-S2, no carotid bruits Msk:  decreased ROM.   Pulses:  pulses normal in all 4 extremities Extremities:  No clubbing or cyanosis. Neurologic:  Alert and oriented x 3. Skin:  Intact without lesions or rashes. Psych:  Normal affect.   Impression & Recommendations:  Problem # 1:  OBESITY, NOS (ICD-278.00) Assessment Improved  Problem # 2:  AORTIC STENOSIS, MILD (ICD-424.1) Assessment: Unchanged  Her updated medication list for this problem includes:    Isosorbide Mononitrate Cr 30 Mg Tb24 (Isosorbide mononitrate) .Marland Kitchen... Take 1 tablet by mouth once a day    Metoprolol Tartrate 50 Mg Tabs (Metoprolol tartrate) .Marland Kitchen... Take 1 tab by mouth two times per day    Hydrochlorothiazide 25 Mg Tabs (Hydrochlorothiazide) .Marland Kitchen... Take 1 tab by mouth every morning  Problem # 3:  CORONARY, ARTERIOSCLEROSIS (ICD-414.00) Assessment: Unchanged  Her updated medication list for this problem includes:    Isosorbide Mononitrate Cr 30 Mg Tb24 (Isosorbide mononitrate) .Marland Kitchen... Take 1 tablet by mouth once a day    Metoprolol Tartrate 50 Mg Tabs (Metoprolol tartrate) .Marland Kitchen... Take 1 tab by mouth two times per day     Norvasc 5 Mg Tabs (Amlodipine besylate) .Marland Kitchen... 1 tablet by mouth daily for blood pressure    Bayer Aspirin 325 Mg Tabs (Aspirin) .Marland Kitchen... 1 tablet by mouth daily  Orders: EKG w/ Interpretation (93000)  Problem # 4:  HYPERLIPIDEMIA, CONTROLLED (ICD-272.4) Her LDL is not at goal. She is also on 40 mg of simvastatin amlodipine. Will change to atorvastatin 80 mg with all blood work in 6-8 weeks. Note sent to primary care. Her updated medication list for this problem includes:    Lipitor 80 Mg Tabs (Atorvastatin calcium) .Marland Kitchen... Take 1 tablet at bedtime  Patient Instructions: 1)  Your physician recommends that you schedule a follow-up appointment in: 1 year with Dr. Daleen Squibb 2)  Your physician recommends that you return for a FASTING lipid profile and Liver on [facility] 27,2012  TUESDAY 3)  Your physician has recommended you make the following change in your medication:  Prescriptions: LIPITOR 80 MG TABS (ATORVASTATIN CALCIUM)  take 1 tablet at bedtime  #30 x 11   Entered by:   Lisabeth Devoid RN   Authorized by:   Gaylord Shih, MD, [facility]   Signed by:   Lisabeth Devoid RN on 11/15/2010   Method used:   Print then Give to Patient   RxID:   [id number]

## 2010-12-13 ENCOUNTER — Other Ambulatory Visit: Payer: Self-pay | Admitting: Family Medicine

## 2010-12-13 NOTE — Telephone Encounter (Signed)
Refill request

## 2011-01-03 ENCOUNTER — Other Ambulatory Visit (INDEPENDENT_AMBULATORY_CARE_PROVIDER_SITE_OTHER): Payer: Self-pay | Admitting: *Deleted

## 2011-01-03 DIAGNOSIS — E785 Hyperlipidemia, unspecified: Secondary | ICD-10-CM

## 2011-01-03 LAB — HEPATIC FUNCTION PANEL: Total Bilirubin: 0.6 mg/dL (ref 0.3–1.2)

## 2011-01-03 LAB — LIPID PANEL
HDL: 44.2 mg/dL (ref >39.00)
Total CHOL/HDL Ratio: 3
VLDL: 19 mg/dL (ref 0.0–40.0)

## 2011-01-04 ENCOUNTER — Telehealth: Payer: Self-pay | Admitting: *Deleted

## 2011-01-04 NOTE — Telephone Encounter (Signed)
Pt aware of lab results. Debbie Adir Schicker RN  

## 2011-02-21 NOTE — Assessment & Plan Note (Signed)
[facility]                            CARDIOLOGY OFFICE NOTE   NAME:Terri Estrada, Terri Estrada                      MRN:          [id number]  DATE:11/06/2007                            DOB:          09/28/1951    I asked by Dr. Wilhemina Bonito to consult and establish Terri Estrada as a  patient with Terri Estrada cardiac history.   HISTORY OF PRESENT ILLNESS:  Terri Estrada is a delightful, 60 year old, married,  African American female, Terri Estrada of a patient of mine, who has a history of  premature coronary disease at age 27.  At that time, Terri Estrada presented with  severe heartburn and had an acute inferior wall infarct.  I do not have  the records, but Terri Estrada does bring pictures of Terri Estrada coronary angiogram which  shows a high-grade mid right that was stented.   Terri Estrada has been follow with Dr. Sharyn Lull.  However, Terri Estrada has not seeing him  in over 5 years.   Terri Estrada risk factors include hypertension, tobacco use which Terri Estrada quit on the  day of Terri Estrada heart attack, obesity, sedentary lifestyle, hypertension and  mixed hyperlipidemia.   Terri Estrada is having no angina or anginal equivalents at present.   CURRENT MEDICATIONS:  1. Avalide 300/25 daily.  2. Isosorbide chronic release 30 mg a day.  3. Metformin 1000 mg p.o. b.i.d.  4. Metoprolol 50 mg b.i.d.  5. Norvasc 10 mg a day.  6. Simvastatin 40 mg nightly.  7. Enteric-coated aspirin 325 mg a day.   Terri Estrada last lipid panel showed an LDL of 91, HDL 46, triglycerides 163,  cholesterol 170, normal LFTs.  This apparently was on 20 mg of  simvastatin.  Terri Estrada has not had repeat labs on 40 mg.   Terri Estrada hemoglobin A1c was last 6.5%.  Terri Estrada says Terri Estrada blood pressure stays  under good control.   ALLERGIES:  No known drug allergies.   PAST SURGICAL HISTORY:  None.   FAMILY HISTORY:  Negative for premature coronary disease.   SOCIAL HISTORY:  Terri Estrada is married with four children.  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  Terri Estrada does not do much for herself, it  does  not sound like.   REVIEW OF SYSTEMS:  Negative other than HPI.   PHYSICAL EXAMINATION:  VITAL SIGNS:  Blood pressure is 116/80, pulse 67  and regular.  Terri Estrada is 5 feet 3 inches, weight 239 pounds.  HEENT:  Normocephalic, atraumatic.  PERRL.  Extraocular is intact.  Sclerae clear.  Facial symmetry is normal.  Dentition satisfactory.  NECK:  Supple.  Carotids are equal bilateral without bruits, no JVD.  Thyroid is not enlarged.  Trachea is midline.  LUNGS:  Clear.  HEART:  A poorly appreciated PMI.  Terri Estrada has a normal S1, S2 without  gallop or rub.  ABDOMEN:  Soft, good bowel sounds.  No midline bruit.  No obvious  organomegaly.  EXTREMITIES:  There is no cyanosis, clubbing or edema.  Pulses intact.  NEUROLOGIC:  Intact.   Electrocardiogram shows an old inferior wall infarct, otherwise  unchanged.   Terri Estrada  had a 2-D echocardiogram on October 16, 2007, at [facility].  Terri Estrada has  normal left ventricular systolic function, EF 60%, mild aortic valve  stenosis, mild mitral valve regurgitation, mild left atrial enlargement.  Terri Estrada had mild LVH as well.   ASSESSMENT/PLAN:  Terri Estrada has had previous coronary artery disease,  status post acute inferior wall infarct with very little residual  damage.  Terri Estrada has had a previous intervention of the right coronary  artery.  Terri Estrada has multiple cardiac risk factors including diabetes,  obesity, sedentary lifestyle, hyperlipidemia, hypertension and remote  smoking.  Terri Estrada does not smoke any further.  Terri Estrada LDL was not optimized and  Terri Estrada needs repeat lipids on this 40 mg of simvastatin.  Terri Estrada goal will be  60-75, if at all possible.  I doubt Terri Estrada will get there even with 80 mg  of simvastatin.   Terri Estrada is also way overdue an objective assessment of Terri Estrada coronary disease.  I explained Terri Estrada and Terri Estrada husband at length today using a heart model and  talked about the path of physiology of heart attacks.   PLAN:  1. Exercise rest stress Myoview off of metoprolol.  2. Fasting  lipids on Terri Estrada current dose of 40 mg of simvastatin.  3. Consider changing to a Lipitor 80 or Crestor 40 if Terri Estrada goal is not      met.  4. Three hours of walking per week.  5. Weight loss.  6. Tight control of Terri Estrada blood sugar and blood pressure.  7. See a cardiologist once a year.     Thomas C. Daleen Squibb, MD, [facility]  Electronically Signed    TCW/MedQ  DD: 11/06/2007  DT: 11/07/2007  Job #: [id number]   cc:   Wilhemina Bonito, M.D.

## 2011-02-21 NOTE — Assessment & Plan Note (Signed)
[facility]                            CARDIOLOGY OFFICE NOTE   LASANDRA, BATLEY                      MRN:          [id number]  DATE:12/05/2007                            DOB:          [birth year]    Ms. Lengacher is a pleasant 60 year old patient Dr. Daleen Squibb.  I was asked to  see her since he was running late and needed to be at a meeting in  [place]   The patient has known coronary artery disease.  She had acute inferior  wall MI back in 2001.   She has normal LV function.  She had an echo in January of 2009 which  showed mild AS and an EF of 60%.  She has been doing well.  Her risk  factors have been better modified, but still not ideal on 80 of  simvastatin.  Her LDL cholesterol is still 93.  I have read through the  notes from her primary care doctor and Dr. Daleen Squibb.  Her target is 60-70  and they have escalated her simvastatin to max dose without effect.  I  told her we would switch her to Vytorin.   The patient has not been having significant chest pain, PND, orthopnea.  She has mild exertional dyspnea secondary to obesity.  The patient's  Myoview done November 25, 2007 is low-risk with a small inferior lateral  defect at the base.   I showed her the pictures and explained to her that continued medical  therapy was warranted.  Her review of systems otherwise negative.  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   Her current medications include Avalide 325, isosorbide 30 a day,  metformin 1 gram b.i.d., metoprolol 50 b.i.d. Norvasc 10 a day, and  Vytorin 10/80 to be started, an aspirin a day.   She uses [employer] 58-month supply at a time.   EXAM:  Is remarkable for weight of 238, blood pressure 130/80, pulse 83  and regular, respiratory rate 16, afebrile.  HEENT:  Unremarkable.  Carotids are normal without bruit, no lymphadenopathy, thyromegaly, JVP  elevation.  LUNGS:  Clear with good diaphragmatic motion.  No  wheezing.  There is no S1-S2 with a systolic murmur mild AS.  PMI is not palpable.  ABDOMEN:  Protuberant.  Bowel sounds positive.  No AAA.  No tenderness.  No hepatosplenomegaly or hepatojugular reflux.  Distal pulse intact with trace edema.  NEURO:  Nonfocal.  SKIN:  Warm and dry.  No muscular weakness.   Her baseline EKG shows sinus rhythm with poor R wave progression and an  old inferior wall MI.   IMPRESSION:  1. Coronary disease, previous IMI, low-risk Myoview.  Continue aspirin      and beta blocker.  2. Mild aortic stenosis with audible murmur.  Follow-up echo in 2      years.  No need for SBE prophylaxis.  3. Hypercholesterolemia, suboptimally controlled on high-dose      simvastatin.  Switch to Vytorin.  Lipid and liver profile in 3      months.  Follow-up with Dr. Daleen Squibb  in 6 months.  4. Trace lower extremity edema.  Continue Avalide with low-salt diet.      Elevate legs at the end of the day.  Encourage increased activity      and weight loss.     Noralyn Pick. Eden Emms, MD, [facility]  Electronically Signed    PCN/MedQ  DD: 12/05/2007  DT: 12/06/2007  Job #: [id number]

## 2011-02-24 NOTE — Discharge Summary (Signed)
[facility]  Patient:    Terri Estrada, Terri Estrada                      MRN: [id number] Adm. Date:  [id number] Disc. Date: [id number] Attending:  Robynn Pane CC:         Delano Metz, M.D.   Discharge Summary  ADMITTING DIAGNOSES: 1. Acute inferoposterior wall myocardial infarction. 2. Uncontrolled hypertension. 3. Morbid obesity. 4. Positive family history of coronary artery disease. 5. Sickle cell trait.  FINAL DIAGNOSES: 1. Status post acute inferior wall myocardial infarction status post emergency    percutaneous transluminal coronary angioplasty and stenting to right    coronary artery. 2. Hypertension. 3. Morbid obesity. 4. Positive family history of coronary artery disease. 5. Sickle cell trait. 6. Anemia due to blood loss during procedure. 7. Anhydration.  DISCHARGE HOME MEDICATIONS: 1. Toprol XL 50 mg one tablet daily. 2. Altace 5 mg one capsule daily. 3. Imdur 30 mg one tablet daily in the morning. 4. Enteric-coated aspirin 325 mg one tablet daily. 5. Plavix 75 mg one tablet daily with food for one month. 6. Protonix 40 mg one tablet daily for one month. 7. Nitrostat 0.4 mg sublingual use as directed. 8. ______ 325 mg one tablet twice daily for one month.  ACTIVITY:  Avoid heavy exertion, lifting, pushing, or pulling.  DIET:  Low salt, low cholesterol.  DISCHARGE INSTRUCTIONS:  Post angioplasty and stent instructions have been given.  Follow up with me in one week.  CONDITION ON DISCHARGE:  Stable.  HISTORY OF PRESENT ILLNESS:  Terri Estrada is a 60 year old black female with a past medical history significant for hypertension, morbid obesity, positive family history of coronary artery disease, sickle cell trait.  She came to the ER complaining of retrosternal chest pressure with ______ by walking approximately around 8:30 p.m. associated with nausea, diaphoresis, and shortness of breath.  EKG done in the ER showed ST elevation  in 2, 3, aVL, with ST depression in V2, V3, and 1 in aVL suggestive of inferoposterior wall ______ .  Patient states she has been having some left chest pain on and off for last two weeks but did not seek medical attention.  Patient received IV heparin, nitrates, beta blocker, and aspirin in ER with partial relief of chest pain.  Patient denies any palpitation, lightheadedness, or syncope. Denies PND, orthopnea, or leg swelling.  PAST MEDICAL HISTORY:  As above.  PAST SURGICAL HISTORY:  None.  SOCIAL HISTORY:  She is married and has four children.  Works at day care center.  No history of smoking or alcohol abuse.  FAMILY HISTORY:  Positive for coronary artery disease.  MEDICATIONS:  She was on hydrochlorothiazide and Tylenol p.r.n.  PHYSICAL EXAMINATION:  GENERAL:  She was alert, awake, oriented x 3, in no acute distress.  VITAL SIGNS:  Blood pressure was 164/92, pulse was 86 regular, ______ .  NECK:  Supple, no JVD, no bruits.  LUNGS:  Clear to auscultation without rhonchi or rales.  CARDIOVASCULAR:  S1, S2 was normal, there was soft systolic murmur and S4 gallop.  ABDOMEN:  Soft, bowel sounds were present, nontender.  EXTREMITIES:  There was no clubbing, cyanosis, or edema.  LABORATORIES:  Chest x-ray showed borderline heart size, lingular scar versus subsegmental atelectasis.  Her cholesterol was 170, triglycerides 210, HDL of 30, LDL of 98.  Her cardiac enzymes: CPK first set was 161, MB 0.8, with liter index of 0.5 seconds at  2541, CPK-MB was 457 with a liter index of 18. _Third set CPK 517, MB of 52, with a liter index of 10.1.  _Third set CPK 225, MB of 7.2, with a liter index of 3.2.  Today, her CPK is 128, MB of 1.4, and liter index of 1.1, troponin has come down to 1.97.  Other labs: Sodium was 137, potassium 3.3, chloride 100, bicarb of 30, glucose was 133, BUN 19, creatinine 1.0.  BMP on September 02, 2000: Sodium 137, potassium 4.0, chloride 104, bicarb 27,  fasting blood glucose was 103, BUN 16, creatinine 0.9.  Hemoglobin A1C was 5.9.  Admission hemoglobin was 12.5, hematocrit 34.6, white count of 10.0.  On September 04, 2000, hemoglobin was 10.7, hematocrit 31, BUN was 15, creatinine 0.8, potassium was 4.3.  HOSPITAL COURSE:  Patient underwent emergency PTCA and stenting to right coronary artery as per cath and PTCA ______ .  Patient tolerated the procedure well.  There were no complications.  Patient was transferred to CCU in stable condition.  Patient does have significant lesion in left circumflex and OM1.  Patient did not have any episodes of chest pain during her hospital stay.  Patient has been ambulating in the hallway without any problems. Patient had 2-D echo done yesterday which showed moderate to severe ______ hypokinesia with ejection fraction of 50-55%, has trace TR and mild MR. Patients hemoglobin has been stable and ______ has been negative.  Patient is eager to go home.  Discussed at length regarding phase II cardiac rehab. Patient states she has to be at day care every morning and is not interested at this point for phase II rehab.  We will schedule her for stress Cardiolite in a few weeks to evaluate the significance of left circumflex lesion unless patient has recurrent chest pain.  Patient will be discharged home on her home medications and will be followed up in my office in one week and Dr. Salvadore Farber family practice in two weeks. DD:  09/04/00 TD:  09/04/00 Job: [id number]

## 2011-02-24 NOTE — Cardiovascular Report (Signed)
[facility]  Patient:    Terri Estrada, Terri Estrada                      MRN: [id number] Proc. Date: 08/30/00 Adm. Date:  [id number] Attending:  Robynn Pane CC:         Cardiac Catheterization Laboratory   Cardiac Catheterization  PROCEDURE: 1. Left cardiac catheterization with selective left and right coronary    angiography, left ventriculography via the right groin using Judkins    technique. 2. Successful percutaneous transluminal coronary angioplasty to the mid and    distal junctional right coronary artery using 3.5 mm x 1.5 mm long    CrossSail balloon. 3. Successful deployment of a 3.5 mm x 18.0 mm long BX velocity stent    in the mid and distal junction of the right coronary artery. 4. Temporary transvenous pacemaker insertion via right femoral approach.  CARDIOLOGISTEduardo Osier Sharyn Lull, M.D.  INDICATIONS:  Terri Estrada is a 60 year old black female with a past medical history significant for hypertension, morbid obesity, a positive family history for coronary artery disease, sickle cell trait, who came to the emergency room complaining of retrosternal chest pressure grade 10/10 while walking at approximately 8:30 p.m., associated with nausea, diaphoresis, and shortness of breath.  An electrocardiogram done in the emergency room showed ST elevation in lead II, lead III, aVF, and ST depression in V2, V3, and lead I, and aVL, suggestive of inferoposterior wall injury pattern.  The patient states she has been having similar chest pain off and on for the last two weeks, but did not seek medical attention.  Today the pain was severe, so she decided to come to the emergency room.  The patient received IV heparin, nitrates, beta blocker, and aspirin in the emergency room, with partial relief of the chest pain.  She denies any palpitations, lightheadedness, or syncope. Denies a history of PND, orthopnea, or leg swelling.  Denies a history of right  sternal or ________ angina.  Due to typical anginal pain and electrocardiogram changes, discussed at length with the patient and with her husband about various options of treatment, i.e. thrombolytic therapy versus emergency PTCA and stenting, its risks and benefits, i.e. death or myocardial infarction, stroke, risk of restenosis, with local vascular complications, an emergency CABG, etc., and they consented for the procedure.  DESCRIPTION OF PROCEDURE:  After obtaining the informed consent, the patient was brought to the cardiac catheterization laboratory and was placed on the fluoroscopy table.  The right groin was prepped and draped in the usual fashion.  Xylocaine 2% was used for local anesthesia in the right groin.  With the help of a thin-walled needle, the 7-French arterial and 6-French venous sheath were placed.  Both of the sheaths were aspirated and flushed.  Next, a 6-French left Judkins catheter was advanced over the wire under fluoroscopic guidance, up to the ascending aorta.  The wire was pulled out.  The catheter was aspirated and connected to the manifold.  The catheter was further advanced and engaged into the left pulmonary ostium.  Multiple views of the left system were taken.  Next the catheter was disengaged and was pulled out over the wire and was replaced with a 6-French right Judkins catheter which was advanced over the wire under fluoroscopic guidance up to the ascending aorta.  The wire was pulled out.  The catheter was aspirated and connected to the manifold.  The catheter was further advanced into  the right coronary ostium.  A single view of the right system were taken.  Next the catheter was disengaged and was pulled out over the wire and was replaced with a 6-French pigtail catheter which was advanced over the wire under fluoroscopic guidance up to the ascending aorta.  At the end of the procedure the catheter was further advanced across the aortic valve into  the LV.  LV pressures were recorded.  Next an LV-graphy was done in the 30-degree RAO position. Post-angiographic pressures were recorded from LV and then pullback pressures were recorded from the aorta.  There was no gradient across the aortic valve. Next the pigtail catheter was pulled out.  The sheaths were aspirated and flushed.  A temporary transvenous balloon tip pacemaker was also inserted prior to starting the interventional procedure via the right femoral venous approach, without any complications.  The pacemaker was kept on standby.  FINDINGS:  The patients left ventricle showed inferior wall hypokinesia, an ejection fraction of 45%-50%.  RESULTS: 1. Left main coronary artery:  The left main coronary artery was patent. 3. Left anterior descending coronary artery:  The LAD had 20%-30% midstenosis.    The diagonal-I had mild disease. 3. Left circumflex coronary artery:  The left circumflex had 60%-70%    midstenosis.  The OM-I has 80%-85% ostial stenosis.  The OM-II is patent    which is medium-sized.  The OM-III and OM-IV are very small. 4. Right coronary artery:  The right coronary artery was 100% occluded    beyond the mid and distal junction.  INTERVENTIONAL PROCEDURE:  A successful percutaneous transluminal coronary angioplasty to the mid and distal junction of the right coronary artery was done using 3.5 mm x 15.0 mm long CrossSail balloon for predilatation and then 3.5 mm x 18.0 mm long BX velocity stent was deployed at 10 atmospheric pressure, which was expanded, going up to 15 atmospheric pressure.  The lesion was dilated from 100% to 0% residual with excellent TIMI grade 3 distal flow, without evidence of dissection or distal embolization.  The patient received weight-based heparin, ReoPro, Plavix 300 mg during the procedure.  The patient had an episode of axillary idioventricular rhythm during the procedure, requiring 75 mg of lidocaine.  The patient tolerated the  procedure well. There were no complications.  The guiding catheter used was an IM 7-French, and the wire used was a Hi-Torque 0.014 inch floppy wire.   The patient was transferred to the coronary care unit in stable condition. DD:  08/31/00 TD:  08/31/00 Job: [id number]

## 2011-02-27 ENCOUNTER — Ambulatory Visit: Payer: Self-pay | Admitting: Family Medicine

## 2011-03-03 ENCOUNTER — Ambulatory Visit (INDEPENDENT_AMBULATORY_CARE_PROVIDER_SITE_OTHER): Payer: Self-pay | Admitting: Family Medicine

## 2011-03-03 ENCOUNTER — Encounter: Payer: Self-pay | Admitting: Family Medicine

## 2011-03-03 DIAGNOSIS — E119 Type 2 diabetes mellitus without complications: Secondary | ICD-10-CM

## 2011-03-03 DIAGNOSIS — E785 Hyperlipidemia, unspecified: Secondary | ICD-10-CM

## 2011-03-03 DIAGNOSIS — I10 Essential (primary) hypertension: Secondary | ICD-10-CM

## 2011-03-03 LAB — POCT GLYCOSYLATED HEMOGLOBIN (HGB A1C): Hemoglobin A1C: 6.4

## 2011-03-03 MED ORDER — ATORVASTATIN CALCIUM 80 MG PO TABS: 80.0000 mg | ORAL_TABLET | Freq: Every day | ORAL | Status: DC

## 2011-03-03 MED ORDER — METFORMIN HCL 1000 MG PO TABS: 1000.0000 mg | ORAL_TABLET | Freq: Two times a day (BID) | ORAL | Status: DC

## 2011-03-03 MED ORDER — METOPROLOL SUCCINATE ER 25 MG PO TB24: 25.0000 mg | ORAL_TABLET | Freq: Every day | ORAL | Status: DC

## 2011-03-03 MED ORDER — IRBESARTAN-HYDROCHLOROTHIAZIDE 300-12.5 MG PO TABS: 1.0000 | ORAL_TABLET | Freq: Every day | ORAL | Status: DC

## 2011-03-03 NOTE — Assessment & Plan Note (Signed)
Due for A1c 

## 2011-03-03 NOTE — Assessment & Plan Note (Signed)
Adding ARB and decreasing Toprol. Patient to call with BPs in 1-2 weeks. Precautions given.

## 2011-03-03 NOTE — Progress Notes (Signed)
  Subjective:    Patient ID: Terri Estrada, female    DOB: [birth year], 60 y.o.   MRN: [id number]  HPI  1. HTN: Rx: metoprolol, norvasc, HCTZ. had cough with ACE and previously could not afford ARB - not may go back on previous medication (Avalide) with MAP. denies CP, SOB, N/V/D, LE edema, HA, dizziness. + MI in 2001. has cardiologist - Adolph Pollack.  2. HLD: Rx: Lipitor 80mg . due for recheck.  3. DM2: controlled. Rx metformin. she denies numbness/tingling in hands/feet, vision changes. last eye appt was 3 years ago, she can't afford to go at this time.   4. Obesity: not exercising regularly  Review of Systems SEE HPI.    Objective:   Physical Exam General: Obese, pleasant. Vitals reviewed.  Lungs: CTAB.  Heart: RRR 3/6 SEM at LSB.  Abdomen: Bowel sounds positive,abdomen soft and non-tender without masses, organomegaly or hernias noted.  Pulses: 2+ DP.  Extremities: No edema.  Neurologic: Sensation intact to light touch and DTRs symmetrical and normal.  Skin: Intact without suspicious lesions or rashes.      Assessment & Plan:

## 2011-03-03 NOTE — Patient Instructions (Signed)
It was nice to see you today!  We are checking labs. 

## 2011-03-03 NOTE — Assessment & Plan Note (Signed)
Rechecking LDL today.

## 2011-03-04 ENCOUNTER — Other Ambulatory Visit: Payer: Self-pay | Admitting: Family Medicine

## 2011-03-04 LAB — LDL CHOLESTEROL, DIRECT: Direct LDL: 75 mg/dL

## 2011-03-06 NOTE — Telephone Encounter (Signed)
Refill request

## 2011-03-07 ENCOUNTER — Encounter: Payer: Self-pay | Admitting: Family Medicine

## 2011-03-08 ENCOUNTER — Telehealth: Payer: Self-pay | Admitting: Family Medicine

## 2011-03-08 NOTE — Telephone Encounter (Signed)
Patient returned call and was informed of lab results.Busick, Rodena Medin

## 2011-03-08 NOTE — Telephone Encounter (Signed)
Did you tell her that they are normal? She is doing well. No change to medications needed.

## 2011-03-08 NOTE — Telephone Encounter (Signed)
Called patient to ask what questions she had about her labs, she was informed earlier that the labs were normal. Says she only wants to speak with Dr Earlene Plater, patient was informed that Earlene Plater was not here today but I would forward the message to her.Nataley Bahri, Rodena Medin

## 2011-03-08 NOTE — Telephone Encounter (Signed)
Pt is asking that Dr Earlene Plater call her about her test results

## 2011-03-08 NOTE — Telephone Encounter (Signed)
Wants to know results of cholesterol and DM test from last week.  pls call or send letter.

## 2011-03-09 NOTE — Telephone Encounter (Signed)
Spoke with patient. She wanted to go over labs again. Reassured patient that she has great numbers - she also read off previous FLP numbers from cardiology,

## 2011-04-27 ENCOUNTER — Other Ambulatory Visit: Payer: Self-pay | Admitting: Family Medicine

## 2011-04-27 MED ORDER — AMLODIPINE BESYLATE 5 MG PO TABS: 5.0000 mg | ORAL_TABLET | Freq: Every day | ORAL | Status: DC

## 2011-05-22 ENCOUNTER — Other Ambulatory Visit: Payer: Self-pay | Admitting: Family Medicine

## 2011-05-23 NOTE — Telephone Encounter (Signed)
Refill request

## 2011-06-07 ENCOUNTER — Other Ambulatory Visit: Payer: Self-pay | Admitting: Family Medicine

## 2011-06-07 MED ORDER — ISOSORBIDE MONONITRATE ER 30 MG PO TB24: 30.0000 mg | ORAL_TABLET | Freq: Every day | ORAL | Status: DC

## 2011-06-21 ENCOUNTER — Ambulatory Visit (INDEPENDENT_AMBULATORY_CARE_PROVIDER_SITE_OTHER): Payer: Self-pay | Admitting: Family Medicine

## 2011-06-21 ENCOUNTER — Encounter: Payer: Self-pay | Admitting: Family Medicine

## 2011-06-21 VITALS — BP 142/84 | HR 82 | Temp 98.4°F | Ht 62.0 in | Wt 220.0 lb

## 2011-06-21 DIAGNOSIS — M1710 Unilateral primary osteoarthritis, unspecified knee: Secondary | ICD-10-CM

## 2011-06-21 DIAGNOSIS — E669 Obesity, unspecified: Secondary | ICD-10-CM

## 2011-06-21 DIAGNOSIS — Z23 Encounter for immunization: Secondary | ICD-10-CM

## 2011-06-21 DIAGNOSIS — I10 Essential (primary) hypertension: Secondary | ICD-10-CM

## 2011-06-21 DIAGNOSIS — E119 Type 2 diabetes mellitus without complications: Secondary | ICD-10-CM

## 2011-06-21 LAB — POCT GLYCOSYLATED HEMOGLOBIN (HGB A1C): Hemoglobin A1C: 6.3

## 2011-06-21 MED ORDER — AMLODIPINE BESYLATE 10 MG PO TABS: 10.0000 mg | ORAL_TABLET | Freq: Every day | ORAL | Status: DC

## 2011-06-21 NOTE — Assessment & Plan Note (Signed)
This is an underlying problem for most of Terri Estrada's medical issues. She is exercising to the extent that she can. We'll discuss her nutrition in detail at nutrition visit tomorrow afternoon with Dr. Gerilyn Pilgrim. Wt Readings from Last 3 Encounters:  06/21/11 220 lb (99.791 kg)  03/03/11 227 lb (102.967 kg)  11/15/10 224 lb (101.606 kg)

## 2011-06-21 NOTE — Assessment & Plan Note (Signed)
Currently well controlled with A1c of 6.4. No changes planned.

## 2011-06-21 NOTE — Patient Instructions (Addendum)
Thank you for coming in today. I increased your Norvasc to 10 mg a day. Come back in 2 months for a recheck. Think about a steroid injection in your knee I will be happy to do that next the visit. Please schedule in with nutrition clinic tomorrow afternoon. This will be with Dr. Gerilyn Pilgrim and myself. Take Care see you soon.

## 2011-06-21 NOTE — Assessment & Plan Note (Signed)
Of the left knee.  We discussed why it's not safe for Ms. Curtiss to take NSAIDs daily with her diabetes and hypertension. She expresses understanding and will use Tylenol and NSAIDs only when needed for short periods of time. She is agreeable to the idea of any injection and will think about it we will discuss this at the next visit.

## 2011-06-21 NOTE — Progress Notes (Signed)
Terri Estrada presents to clinic today to followup: #1 Blood pressure. Is taking medications as noted above including Norvasc 5 Avalide 300 x 12.5, Imdur 30, and Toprol XL 25. She denies any palpitations chest pain shortness of breath or syncope. She feels well otherwise.  #2 Diabetes: Doing well no current issues taking metformin 1000 twice a day. Denies any polyuria or polydipsia or hypoglycemic episodes.  #3 Knee pain: He has left knee arthritis. This bothers her especially at night. During the day she takes Tylenol arthritis strength. However at night she used to take Naprosyn 500. However this prescription has run out in the interim. Her knee pain continues to bother her. She denies any locking or catching or swelling.  #4 Obesity. Is now exercising exercising using water aerobics 2 days a week and walking most days. She has lost 7 pounds. She is agreeable to doing nutrition therapy tomorrow.  PMH reviewed.  ROS as above otherwise neg Medications reviewed.  Exam:  BP 142/84  Pulse 82  Temp(Src) 98.4 F (36.9 C) (Oral)  Ht 5\' 2"  (1.575 m)  Wt 220 lb (99.791 kg)  BMI 40.24 kg/m2 Gen: Well NAD obese HEENT: EOMI,  MMM Lungs: CTABL Nl WOB Heart: RRR no MRG Abd: NABS, NT, ND Exts: Non edematous BL  LE, warm and well perfused.  Knee: Left knee with very mild effusion when compared to right. No jointline tenderness no crepitations on range of motion.

## 2011-06-21 NOTE — Assessment & Plan Note (Signed)
Blood pressures not in range for someone with diabetes. Plan to increase amlodipine to 10 mg daily and followup in 2 months. Patient expresses understanding.

## 2011-06-22 ENCOUNTER — Telehealth: Payer: Self-pay | Admitting: Family Medicine

## 2011-06-22 NOTE — Telephone Encounter (Signed)
Need refill on naproxen.  Please send to pharmacy

## 2011-06-23 NOTE — Telephone Encounter (Signed)
No refills on this medication.

## 2011-06-29 ENCOUNTER — Ambulatory Visit: Payer: Self-pay | Admitting: Family Medicine

## 2011-06-30 ENCOUNTER — Other Ambulatory Visit: Payer: Self-pay | Admitting: Family Medicine

## 2011-06-30 MED ORDER — ISOSORBIDE MONONITRATE ER 30 MG PO TB24: 30.0000 mg | ORAL_TABLET | Freq: Every day | ORAL | Status: DC

## 2011-07-31 ENCOUNTER — Other Ambulatory Visit: Payer: Self-pay | Admitting: Family Medicine

## 2011-07-31 DIAGNOSIS — Z1231 Encounter for screening mammogram for malignant neoplasm of breast: Secondary | ICD-10-CM

## 2011-08-26 ENCOUNTER — Emergency Department (HOSPITAL_COMMUNITY)
Admission: EM | Admit: 2011-08-26 | Discharge: 2011-08-26 | Disposition: A | Discharge status: Home / Self Care | Payer: Self-pay | Attending: Emergency Medicine | Admitting: Emergency Medicine

## 2011-08-26 ENCOUNTER — Emergency Department (HOSPITAL_COMMUNITY): Payer: Self-pay

## 2011-08-26 ENCOUNTER — Encounter (HOSPITAL_COMMUNITY): Payer: Self-pay | Admitting: *Deleted

## 2011-08-26 DIAGNOSIS — I10 Essential (primary) hypertension: Secondary | ICD-10-CM | POA: Insufficient documentation

## 2011-08-26 DIAGNOSIS — E119 Type 2 diabetes mellitus without complications: Secondary | ICD-10-CM | POA: Insufficient documentation

## 2011-08-26 DIAGNOSIS — T1490XA Injury, unspecified, initial encounter: Secondary | ICD-10-CM | POA: Insufficient documentation

## 2011-08-26 DIAGNOSIS — Y92410 Unspecified street and highway as the place of occurrence of the external cause: Secondary | ICD-10-CM | POA: Insufficient documentation

## 2011-08-26 DIAGNOSIS — M25569 Pain in unspecified knee: Secondary | ICD-10-CM | POA: Insufficient documentation

## 2011-08-26 DIAGNOSIS — I252 Old myocardial infarction: Secondary | ICD-10-CM | POA: Insufficient documentation

## 2011-08-26 DIAGNOSIS — M5380 Other specified dorsopathies, site unspecified: Secondary | ICD-10-CM | POA: Insufficient documentation

## 2011-08-26 DIAGNOSIS — M545 Low back pain, unspecified: Secondary | ICD-10-CM | POA: Insufficient documentation

## 2011-08-26 MED ORDER — CYCLOBENZAPRINE HCL 10 MG PO TABS
5.0000 mg | ORAL_TABLET | Freq: Two times a day (BID) | ORAL | Status: AC | PRN
Start: 2011-08-26 — End: 2011-09-05

## 2011-08-26 MED ORDER — NAPROXEN 375 MG PO TABS: 375.0000 mg | ORAL_TABLET | Freq: Two times a day (BID) | ORAL | Status: DC

## 2011-08-26 NOTE — ED Provider Notes (Signed)
History/physical exam/procedure(s) were performed by non-physician practitioner and as supervising physician I was immediately available for consultation/collaboration. I have reviewed all notes and am in agreement with care and plan.   Hilario Quarry, MD 08/26/11 [phone]
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
## 2011-08-28 ENCOUNTER — Encounter: Payer: Self-pay | Admitting: Family Medicine

## 2011-08-28 ENCOUNTER — Ambulatory Visit (INDEPENDENT_AMBULATORY_CARE_PROVIDER_SITE_OTHER): Payer: Self-pay | Admitting: Family Medicine

## 2011-08-28 DIAGNOSIS — I10 Essential (primary) hypertension: Secondary | ICD-10-CM

## 2011-08-28 DIAGNOSIS — M1990 Unspecified osteoarthritis, unspecified site: Secondary | ICD-10-CM

## 2011-08-28 NOTE — Patient Instructions (Signed)
Thank you for coming in today. Keep a blood pressure log at home.  Measure 1x a day at different time.  Write it down and bring it in.  Get that orange card and see me in December.  We will inject the worst knee first.
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
## 2011-08-28 NOTE — Assessment & Plan Note (Signed)
Blood pressure elevated today. I'm not sure what her blood pressure is doing at home I suspect it's higher in the office than at home. Plan to keep home blood pressure log and followup in one month. If blood pressure is still elevated will increase metoprolol.

## 2011-08-28 NOTE — Assessment & Plan Note (Signed)
Arthritis in bilateral knees. Left worse than right. Would like to attempt a steroid injection the left knee at the next visit.

## 2011-09-04 ENCOUNTER — Ambulatory Visit (HOSPITAL_COMMUNITY): Payer: Self-pay

## 2011-09-29 ENCOUNTER — Ambulatory Visit (INDEPENDENT_AMBULATORY_CARE_PROVIDER_SITE_OTHER): Payer: Self-pay | Admitting: Family Medicine

## 2011-09-29 VITALS — BP 177/90 | HR 94 | Temp 97.7°F | Ht 62.0 in | Wt 217.0 lb

## 2011-09-29 DIAGNOSIS — L600 Ingrowing nail: Secondary | ICD-10-CM | POA: Insufficient documentation

## 2011-09-29 MED ORDER — DOXYCYCLINE HYCLATE 100 MG PO TABS: 100.0000 mg | ORAL_TABLET | Freq: Two times a day (BID) | ORAL | Status: AC

## 2011-09-29 NOTE — Progress Notes (Signed)
Terri Estrada presents to clinic today to discuss a new right ingrown toenail with infection.  Her toe worsened over the past week.  She denies any fevers or chills and feels well otherwise. She does note that her right toe is tender to touch and producing pus.  She has had 2 attempts at toenail removal of the right great toe. It has grown back each time.  She feels well otherwise.  PMH reviewed.  ROS as above otherwise neg Medications reviewed. Current Outpatient Prescriptions  Medication Sig Dispense Refill  . amLODipine (NORVASC) 10 MG tablet Take 1 tablet (10 mg total) by mouth daily.  30 tablet  6  . aspirin 325 MG tablet Take 325 mg by mouth daily.        Marland Kitchen atorvastatin (LIPITOR) 80 MG tablet Take 1 tablet (80 mg total) by mouth daily.  30 tablet  11  . Calcium Carbonate (CALCIUM 600) 1500 MG TABS Take 600 mg by mouth 2 (two) times daily.       Marland Kitchen doxycycline (VIBRA-TABS) 100 MG tablet Take 1 tablet (100 mg total) by mouth 2 (two) times daily.  14 tablet  0  . irbesartan-hydrochlorothiazide (AVALIDE) 300-12.5 MG per tablet Take 1 tablet by mouth daily.  30 tablet  11  . isosorbide mononitrate (IMDUR) 30 MG 24 hr tablet Take 1 tablet (30 mg total) by mouth daily.  90 tablet  2  . metFORMIN (GLUCOPHAGE) 1000 MG tablet Take 1 tablet (1,000 mg total) by mouth 2 (two) times daily.  60 tablet  3  . metoprolol succinate (TOPROL XL) 25 MG 24 hr tablet Take 1 tablet (25 mg total) by mouth daily.  30 tablet  11  . Multiple Vitamins-Minerals (MULTIVITAMINS THER. W/MINERALS) TABS Take 1 tablet by mouth daily.        . Omega-3 Fatty Acids (FISH OIL) 1200 MG CAPS Take 1 capsule by mouth 2 (two) times daily.          Exam:  BP 177/90  Pulse 94  Temp(Src) 97.7 F (36.5 C) (Oral)  Ht 5\' 2"  (1.575 m)  Wt 217 lb (98.431 kg)  BMI 39.69 kg/m2 Gen: Well NAD Lungs: CTABL Nl WOB Heart: RRR no MRG Abd: NABS, NT, ND Exts: Non edematous BL  LE, warm and well perfused.  Right great toenail elevated with pus  tender to touch.  Good capillary refill.  Procedure note: Consent obtained and timeout performed. Right great toe cleaned with alcohol. Using a 30-gauge half inch needle 5 mL of 1% lidocaine without epinephrine was placed in the medial dorsal and lateral spaces to achieve good anesthesia of the distal toe. Very small amount of lidocaine was infiltrated underneath the toenail to achieve maximal anesthesia.  A rubber band tourniquet was applied. Area was then cleaned with Betadine.  Using a nail lift her the medial aspect of the right nail is lifted from the matrix.  Then sharp scissors were used to cut 5 mm nail sliver from the medial aspect down to the origin.  Hemostats were used to grab the nail sliver which was removed in total.  The curette was used to debride the remaining nail matrix.  Small amount of phenol was then applied and then immediately removed with copious amounts of alcohol swabs.  Tourniquet removed total time was 6.5 minutes.  Good capillary refill of the distal toe tip.  A pressure dressing was applied.

## 2011-09-29 NOTE — Patient Instructions (Signed)
Thank you for coming in today. Take the antibiotic two times a day to be safe.  Go on your trip if you feel up to it.  Come see me in 2-3 weeks.  Keep the wound clean.  Change the dressing tomorrow.   Toenail Removal Toenails may need to be removed because of injury, infections, or to correct abnormal growth. A special non-stick bandage will likely be put tightly on your toe to prevent bleeding. Often times a new nail will grow back. Sometimes the new nail may be deformed. Most of the time when a nail is lost, it will gradually heal, but may be sensitive for a long time. HOME CARE INSTRUCTIONS    Keep your foot elevated to relieve pain and swelling. This will require lying in bed or on a couch with the leg on pillows or sitting in a recliner with the leg up. Walking or letting your leg dangle may increase swelling, slow healing, and cause throbbing pain.     Keep your bandage dry and clean.     Change your bandage in 24 hours.     After your bandage is changed, soak your foot in warm, soapy water for 10 to 20 minutes. Do this 3 times per day. This helps reduce pain and swelling. After soaking your foot apply a clean, dry bandage. Change your bandage if it is wet or dirty.     Only take over-the-counter or prescription medicines for pain, discomfort, or fever as directed by your caregiver.     See your caregiver as needed for problems.  You might need a tetanus shot now if:  You have no idea when you had the last one.     You have never had a tetanus shot before.     The injured area had dirt in it.  If you need a tetanus shot, and you decide not to get one, there is a rare chance of getting tetanus. Sickness from tetanus can be serious. If you did get a tetanus shot, your arm may swell, get red and warm to the touch at the shot site. This is common and not a problem. SEEK IMMEDIATE MEDICAL CARE IF:    You have increased pain, swelling, redness, warmth, drainage, or bleeding.     You  have a fever.     You have swelling that spreads from your toe into your foot.  Document Released: 06/24/2003 Document Revised: 06/07/2011 Document Reviewed: 10/05/2008 [facility] Patient Information 2012 [place], [place].

## 2011-10-01 ENCOUNTER — Encounter: Payer: Self-pay | Admitting: Family Medicine

## 2011-10-01 NOTE — Assessment & Plan Note (Signed)
Ingrown toenail s/p partial removal. Procedure went well. I used phenol as she has had recurrant nail grown after toenail removal in the past. Will also use antibiotics as pt is slightly higher risk for infection given her diabetes.  Will f/u in 1 week or sooner if needed.

## 2011-10-05 ENCOUNTER — Ambulatory Visit (HOSPITAL_COMMUNITY)
Admission: RE | Admit: 2011-10-05 | Discharge: 2011-10-05 | Disposition: A | Discharge status: Home / Self Care | Payer: Self-pay | Source: Ambulatory Visit | Attending: Family Medicine | Admitting: Family Medicine

## 2011-10-05 DIAGNOSIS — Z1231 Encounter for screening mammogram for malignant neoplasm of breast: Secondary | ICD-10-CM | POA: Insufficient documentation

## 2011-10-13 ENCOUNTER — Other Ambulatory Visit: Payer: Self-pay | Admitting: Family Medicine

## 2011-10-13 MED ORDER — VALSARTAN-HYDROCHLOROTHIAZIDE 160-12.5 MG PO TABS: 1.0000 | ORAL_TABLET | Freq: Every day | ORAL | Status: DC

## 2011-10-25 ENCOUNTER — Other Ambulatory Visit: Payer: Self-pay | Admitting: *Deleted

## 2011-10-25 DIAGNOSIS — E785 Hyperlipidemia, unspecified: Secondary | ICD-10-CM

## 2011-10-25 MED ORDER — ATORVASTATIN CALCIUM 80 MG PO TABS: 80.0000 mg | ORAL_TABLET | Freq: Every day | ORAL | Status: DC

## 2011-10-27 ENCOUNTER — Ambulatory Visit: Payer: Self-pay | Admitting: Family Medicine

## 2011-12-25 ENCOUNTER — Other Ambulatory Visit: Payer: Self-pay | Admitting: Family Medicine

## 2011-12-25 NOTE — Telephone Encounter (Signed)
Refill request

## 2012-01-16 ENCOUNTER — Other Ambulatory Visit: Payer: Self-pay | Admitting: Family Medicine

## 2012-01-16 DIAGNOSIS — I10 Essential (primary) hypertension: Secondary | ICD-10-CM

## 2012-01-16 MED ORDER — AMLODIPINE BESYLATE 10 MG PO TABS: 10.0000 mg | ORAL_TABLET | Freq: Every day | ORAL | Status: DC

## 2012-02-07 ENCOUNTER — Encounter: Payer: Self-pay | Admitting: *Deleted

## 2012-02-16 ENCOUNTER — Encounter: Payer: Self-pay | Admitting: Cardiology

## 2012-02-16 ENCOUNTER — Ambulatory Visit (INDEPENDENT_AMBULATORY_CARE_PROVIDER_SITE_OTHER): Payer: Self-pay | Admitting: Cardiology

## 2012-02-16 VITALS — BP 134/82 | HR 79 | Ht 62.0 in | Wt 218.0 lb

## 2012-02-16 DIAGNOSIS — E785 Hyperlipidemia, unspecified: Secondary | ICD-10-CM

## 2012-02-16 DIAGNOSIS — E669 Obesity, unspecified: Secondary | ICD-10-CM

## 2012-02-16 DIAGNOSIS — I359 Nonrheumatic aortic valve disorder, unspecified: Secondary | ICD-10-CM

## 2012-02-16 DIAGNOSIS — I2510 Atherosclerotic heart disease of native coronary artery without angina pectoris: Secondary | ICD-10-CM

## 2012-02-16 DIAGNOSIS — I10 Essential (primary) hypertension: Secondary | ICD-10-CM

## 2012-02-16 NOTE — Assessment & Plan Note (Signed)
Stable. Continue aggressive secondary preventive therapy. 

## 2012-02-16 NOTE — Progress Notes (Signed)
HPI  This morning comes in today for evaluation and management of her history of coronary artery disease and previous MI. He is doing well with no symptoms of angina or ischemia. Her blood work is monitored by the [facility]. She is on high-dose statin is also a good blood pressure meds. Her blood pressure is good today. In addition her blood sugars have been tight with good hemoglobin A1c's.  She denies orthopnea, PND or edema.   Past Medical History  Diagnosis Date  . Type II or unspecified type diabetes mellitus without mention of complication, not stated as uncontrolled   . Other and unspecified hyperlipidemia   . Essential hypertension, benign   . Obesity, unspecified   . Coronary atherosclerosis of unspecified type of vessel, native or graft   . Sickle-cell trait   . Arthritis   . Diverticulosis of colon (without mention of hemorrhage)   . Aortic stenosis   . MI (myocardial infarction)   . Osteoarthrosis, unspecified whether generalized or localized, unspecified site   . Onychia and paronychia of toe   . Streptococcal sore throat     Current Outpatient Prescriptions  Medication Sig Dispense Refill  . amLODipine (NORVASC) 10 MG tablet Take 1 tablet (10 mg total) by mouth daily.  90 tablet  3  . aspirin 325 MG tablet Take 325 mg by mouth daily.        Marland Kitchen atorvastatin (LIPITOR) 80 MG tablet Take 1 tablet (80 mg total) by mouth daily.  90 tablet  1  . Calcium Carbonate (CALCIUM 600) 1500 MG TABS Take 600 mg by mouth 2 (two) times daily.       . isosorbide mononitrate (IMDUR) 30 MG 24 hr tablet Take 1 tablet (30 mg total) by mouth daily.  90 tablet  2  . metFORMIN (GLUCOPHAGE) 1000 MG tablet TAKE 1 TABLET TWICE DAILY  180 tablet  3  . metoprolol succinate (TOPROL XL) 25 MG 24 hr tablet Take 1 tablet (25 mg total) by mouth daily.  30 tablet  11  . Multiple Vitamins-Minerals (MULTIVITAMINS THER. W/MINERALS) TABS Take 1 tablet by mouth daily.        . Omega-3 Fatty Acids  (FISH OIL) 1200 MG CAPS Take 1 capsule by mouth 2 (two) times daily.        . valsartan-hydrochlorothiazide (DIOVAN HCT) 160-12.5 MG per tablet Take 1 tablet by mouth daily.  30 tablet  12    Allergies  Allergen Reactions  . Ace Inhibitors     REACTION: cough    Family History  Problem Relation Age of Onset  . Sickle cell anemia Daughter   . Hypertension Mother   . Diabetes type II Mother   . Diabetes type II Sister     History   Social History  . Marital Status: Married    Spouse Name: N/A    Number of Children: 2  . Years of Education: N/A   Occupational History  . DAY CARE    Social History Main Topics  . Smoking status: Former Games developer  . Smokeless tobacco: Never Used   Comment: quit nov 2001  . Alcohol Use: No  . Drug Use: No  . Sexually Active: Yes    Birth Control/ Protection: Post-menopausal   Other Topics Concern  . Not on file   Social History Narrative  . No narrative on file    ROS ALL NEGATIVE EXCEPT THOSE NOTED IN HPI  PE  General Appearance: well developed, well nourished in  no acute distress, obese HEENT: symmetrical face, PERRLA, good dentition  Neck: no JVD, thyromegaly, or adenopathy, trachea midline Chest: symmetric without deformity Cardiac: PMI non-displaced, RRR, normal S1, S2, no gallop or murmur Lung: clear to ausculation and percussion Vascular: all pulses full without bruits  Abdominal: nondistended, nontender, good bowel sounds, no HSM, no bruits Extremities: no cyanosis, clubbing or edema, no sign of DVT, no varicosities  Skin: normal color, no rashes Neuro: alert and oriented x 3, non-focal Pysch: normal affect  EKG Normal sinus rhythm, old inferior Charles Niese infarct, no change since 2008 BMET    Component Value Date/Time   NA 142 10/04/2010 2107   K 4.0 10/04/2010 2107   CL 104 10/04/2010 2107   CO2 24 10/04/2010 2107   GLUCOSE 103* 10/04/2010 2107   BUN 16 10/04/2010 2107   CREATININE 0.69 10/04/2010 2107   CALCIUM 9.6  10/04/2010 2107   GFRNONAA 79 11/25/2007 0851   GFRAA 96 11/25/2007 0851    Lipid Panel     Component Value Date/Time   CHOL 150 01/03/2011 1021   TRIG 95.0 01/03/2011 1021   HDL 44.20 01/03/2011 1021   CHOLHDL 3 01/03/2011 1021   VLDL 19.0 01/03/2011 1021   LDLCALC 87 01/03/2011 1021    CBC    Component Value Date/Time   WBC 5.0 10/04/2010 2107   RBC 3.98 10/04/2010 2107   HGB 11.1* 10/04/2010 2107   HCT 34.2* 10/04/2010 2107   PLT 384 10/04/2010 2107   MCV 85.9 10/04/2010 2107   MCHC 32.5 10/04/2010 2107   RDW 15.5 10/04/2010 2107

## 2012-02-16 NOTE — Patient Instructions (Signed)
Your physician recommends that you continue on your current medications as directed. Please refer to the Current Medication list given to you today.  Your physician wants you to follow-up in:  1 year. You will receive a reminder letter in the mail two months in advance. If you don't receive a letter, please call our office to schedule the follow-up appointment.  Continue to follow-up regularly with your primary care doctor and have your cholesterol checked.

## 2012-02-26 ENCOUNTER — Other Ambulatory Visit: Payer: Self-pay | Admitting: Family Medicine

## 2012-02-26 MED ORDER — OLMESARTAN MEDOXOMIL-HCTZ 40-12.5 MG PO TABS: 1.0000 | ORAL_TABLET | Freq: Every day | ORAL | Status: DC

## 2012-02-26 NOTE — Telephone Encounter (Signed)
Diovan no longer supported with MAP. Change to Benicar HCT 40/12.5

## 2012-03-01 ENCOUNTER — Encounter: Payer: Self-pay | Admitting: Family Medicine

## 2012-03-15 ENCOUNTER — Ambulatory Visit (INDEPENDENT_AMBULATORY_CARE_PROVIDER_SITE_OTHER): Payer: Self-pay | Admitting: Family Medicine

## 2012-03-15 ENCOUNTER — Encounter: Payer: Self-pay | Admitting: Family Medicine

## 2012-03-15 VITALS — BP 125/72 | HR 79 | Ht 62.0 in | Wt 218.0 lb

## 2012-03-15 DIAGNOSIS — E119 Type 2 diabetes mellitus without complications: Secondary | ICD-10-CM

## 2012-03-15 DIAGNOSIS — E785 Hyperlipidemia, unspecified: Secondary | ICD-10-CM

## 2012-03-15 DIAGNOSIS — I10 Essential (primary) hypertension: Secondary | ICD-10-CM

## 2012-03-15 DIAGNOSIS — M25562 Pain in left knee: Secondary | ICD-10-CM

## 2012-03-15 DIAGNOSIS — M25569 Pain in unspecified knee: Secondary | ICD-10-CM

## 2012-03-15 LAB — COMPLETE METABOLIC PANEL WITH GFR
ALT: 73 U/L — ABNORMAL HIGH (ref 0–35)
AST: 55 U/L — ABNORMAL HIGH (ref 0–37)
Albumin: 4.4 g/dL (ref 3.5–5.2)
Alkaline Phosphatase: 144 U/L — ABNORMAL HIGH (ref 39–117)
BUN: 16 mg/dL (ref 6–23)
Calcium: 9.7 mg/dL (ref 8.4–10.5)
Chloride: 103 mEq/L (ref 96–112)
Potassium: 4.1 mEq/L (ref 3.5–5.3)
Sodium: 142 mEq/L (ref 135–145)

## 2012-03-15 LAB — POCT GLYCOSYLATED HEMOGLOBIN (HGB A1C): Hemoglobin A1C: 6.5

## 2012-03-15 LAB — LDL CHOLESTEROL, DIRECT: Direct LDL: 63 mg/dL

## 2012-03-15 NOTE — Patient Instructions (Signed)
Thank you for coming in today. Please continue your BP medicines.  When run out of diovan at the pharmacy call the health dept to start taking benicar.  Let me know how that knee does.  Call if you have redness or swelling or extreme pain or fever.  You should start to feel better over the next few days.  We will do labs today to check for liver, kidney and cholesterol.   If you still have knee pain make an appointment with me at Sports Medicine in July. [phone].  Call or go to the emergency room if you get worse, have trouble breathing, have chest pains, or palpitations.

## 2012-03-18 DIAGNOSIS — M25562 Pain in left knee: Secondary | ICD-10-CM | POA: Insufficient documentation

## 2012-03-18 NOTE — Assessment & Plan Note (Signed)
Left knee pain likely arthritis.  Will try cortisone injection today.  Follow up with Sports if no improvement for guided injection as pt is obese and blind injection may not have hit the target.

## 2012-03-18 NOTE — Assessment & Plan Note (Signed)
Doing well with A1c at goal. No plans to change.

## 2012-03-18 NOTE — Assessment & Plan Note (Signed)
Stable. Will f/u. MAP changes ARB occasionally. Does well. Will follow.

## 2012-03-18 NOTE — Assessment & Plan Note (Signed)
On statin. Plan to check direct LDL and CMP

## 2012-03-18 NOTE — Progress Notes (Signed)
Terri Estrada is a 61 y.o. female who presents to [facility] today for   1) DM: Doing well. No highs or symptomatic lows. No polyuria or polydipsia. Taking meds below.   2) HLD: Taking statin as below. Feels well w/out muscle aches.   3) HTN: Is well as wel. Meds below. No CP, palpitations, SOB, or edema.   4) Knee pain: Left knee continues to hurt. No locking or catching or giving way. Tylenol is somewhat effective. Would like injection today if able.     PMH: Reviewed HTN History  Substance Use Topics  . Smoking status: Former Games developer  . Smokeless tobacco: Never Used   Comment: quit nov 2001  . Alcohol Use: No   ROS as above  Medications reviewed. Current Outpatient Prescriptions  Medication Sig Dispense Refill  . amLODipine (NORVASC) 10 MG tablet Take 1 tablet (10 mg total) by mouth daily.  90 tablet  3  . aspirin 325 MG tablet Take 325 mg by mouth daily.        Marland Kitchen atorvastatin (LIPITOR) 80 MG tablet Take 1 tablet (80 mg total) by mouth daily.  90 tablet  1  . Calcium Carbonate (CALCIUM 600) 1500 MG TABS Take 600 mg by mouth 2 (two) times daily.       . isosorbide mononitrate (IMDUR) 30 MG 24 hr tablet Take 1 tablet (30 mg total) by mouth daily.  90 tablet  2  . metFORMIN (GLUCOPHAGE) 1000 MG tablet TAKE 1 TABLET TWICE DAILY  180 tablet  3  . Multiple Vitamins-Minerals (MULTIVITAMINS THER. W/MINERALS) TABS Take 1 tablet by mouth daily.        . Omega-3 Fatty Acids (FISH OIL) 1200 MG CAPS Take 1 capsule by mouth 2 (two) times daily.        . metoprolol succinate (TOPROL XL) 25 MG 24 hr tablet Take 1 tablet (25 mg total) by mouth daily.  30 tablet  11  . olmesartan-hydrochlorothiazide (BENICAR HCT) 40-12.5 MG per tablet Take 1 tablet by mouth daily.  30 tablet  6    Exam:  BP 125/72  Pulse 79  Ht 5\' 2"  (1.575 m)  Wt 218 lb (98.884 kg)  BMI 39.87 kg/m2 Gen: Well NAD HEENT: EOMI,  MMM Lungs: CTABL Nl WOB Heart: RRR no MRG Abd: NABS, NT, ND Exts: Non edematous BL  LE, warm and  well perfused. Left knee: Mild effusion. No joint line TTP. Crepitions present with ROM.  Neg lachmans and McMurry.   Procedure note:  Consent obtained. Landmarks palpated and marked with indention.  Area medial of patella tendon cleaned with betadine and cold spray applied. 4ml of marcaine and 40mg  kenalog placed into the knee with a 25 gauge 1.5 inch needle. No bleeding. Patient tolerated the procedure well.    Results for orders placed in visit on 03/15/12 (from the past 72 hour(s))  POCT GLYCOSYLATED HEMOGLOBIN (HGB A1C)     Status: Normal   Collection Time   03/15/12  2:53 PM      Component Value Range Comment   Hemoglobin A1C 6.5     LDL CHOLESTEROL, DIRECT     Status: Normal   Collection Time   03/15/12  3:20 PM      Component Value Range Comment   Direct LDL 63     COMPLETE METABOLIC PANEL WITH GFR     Status: Abnormal   Collection Time   03/15/12  3:20 PM      Component Value Range Comment  Sodium 142  135 - 145 (mEq/L)    Potassium 4.1  3.5 - 5.3 (mEq/L)    Chloride 103  96 - 112 (mEq/L)    CO2 27  19 - 32 (mEq/L)    Glucose, Bld 97  70 - 99 (mg/dL)    BUN 16  6 - 23 (mg/dL)    Creat 1.61  0.96 - 1.10 (mg/dL)    Total Bilirubin 0.4  0.3 - 1.2 (mg/dL)    Alkaline Phosphatase 144 (*) 39 - 117 (U/L)    AST 55 (*) 0 - 37 (U/L)    ALT 73 (*) 0 - 35 (U/L)    Total Protein 7.2  6.0 - 8.3 (g/dL)    Albumin 4.4  3.5 - 5.2 (g/dL)    Calcium 9.7  8.4 - 10.5 (mg/dL)    GFR, Est African American 88      GFR, Est Non African American 76

## 2012-04-26 ENCOUNTER — Other Ambulatory Visit: Payer: Self-pay | Admitting: *Deleted

## 2012-04-26 ENCOUNTER — Ambulatory Visit (INDEPENDENT_AMBULATORY_CARE_PROVIDER_SITE_OTHER): Payer: Self-pay | Admitting: Family Medicine

## 2012-04-26 ENCOUNTER — Encounter: Payer: Self-pay | Admitting: Family Medicine

## 2012-04-26 VITALS — BP 129/81 | HR 89 | Temp 98.3°F | Ht 62.0 in | Wt 216.0 lb

## 2012-04-26 DIAGNOSIS — K0889 Other specified disorders of teeth and supporting structures: Secondary | ICD-10-CM | POA: Insufficient documentation

## 2012-04-26 DIAGNOSIS — K089 Disorder of teeth and supporting structures, unspecified: Secondary | ICD-10-CM

## 2012-04-26 DIAGNOSIS — E785 Hyperlipidemia, unspecified: Secondary | ICD-10-CM

## 2012-04-26 MED ORDER — ATORVASTATIN CALCIUM 80 MG PO TABS: 80.0000 mg | ORAL_TABLET | Freq: Every day | ORAL | Status: DC

## 2012-04-26 MED ORDER — TRAMADOL HCL 50 MG PO TABS: 50.0000 mg | ORAL_TABLET | Freq: Four times a day (QID) | ORAL | Status: DC | PRN

## 2012-04-26 NOTE — Assessment & Plan Note (Signed)
A: toothache w/o abscess.  P:  -tramadol -ice pack/cold compress per AVS -dental referral placed as patient with poor dentition and at risk for abscess.

## 2012-04-26 NOTE — Progress Notes (Signed)
Patient ID: Terri Estrada, female   DOB: 05/02/1951, 61 y.o.   MRN: [id number]  HPI 61 yo F presents with 2 days of toothache. R sided, mandibular. She notes slight swelling. The pain is a constant throbbing. She tried tylenol w/o relief. She tried tramadol with temporary relief. She denies fever. She has not been to the dentist in many years.  Med hx: significant for DM.   Review of Systems As per HPI   Objective:   Physical Exam  BP 129/81  Pulse 89  Temp 98.3 F (36.8 C) (Oral)  Ht 5\' 2"  (1.575 m)  Wt 216 lb (97.977 kg)  BMI 39.51 kg/m2 Constitutional: She appears well-developed and well-nourished. No distress.  HENT:  Mouth/Throat:

## 2012-04-26 NOTE — Patient Instructions (Addendum)
Terri Estrada,  Thank you very much for coming in to see me today. For your toothache please do the following: 1. Ice pack x 20 minutes every 3-4 hours  2. Take tramadol or tylenol as needed for pain. 3. Call and come in if you develop fever, swelling or severe pain in the area. 4. My clinic staff will be in contact with you regarding your dental referral if you do not hear anything by Monday call on Tuesday.  Dr. Armen Pickup

## 2012-05-02 ENCOUNTER — Telehealth: Payer: Self-pay | Admitting: Family Medicine

## 2012-05-02 NOTE — Telephone Encounter (Signed)
Called pt and informed that referral was faxed. Terri Estrada, Terri Estrada

## 2012-05-02 NOTE — Telephone Encounter (Signed)
Terri Estrada calling to inquire about referral to Dental Clinic.  Needed to know if one was entered and when she can expect to hear from them.  Please contact her to discuss details.

## 2012-07-05 ENCOUNTER — Encounter: Payer: Self-pay | Admitting: Family Medicine

## 2012-07-05 ENCOUNTER — Ambulatory Visit (INDEPENDENT_AMBULATORY_CARE_PROVIDER_SITE_OTHER): Payer: Self-pay | Admitting: Family Medicine

## 2012-07-05 VITALS — BP 143/84 | HR 73 | Ht 62.0 in | Wt 208.7 lb

## 2012-07-05 DIAGNOSIS — K0889 Other specified disorders of teeth and supporting structures: Secondary | ICD-10-CM

## 2012-07-05 DIAGNOSIS — Z23 Encounter for immunization: Secondary | ICD-10-CM

## 2012-07-05 DIAGNOSIS — E119 Type 2 diabetes mellitus without complications: Secondary | ICD-10-CM

## 2012-07-05 DIAGNOSIS — M65341 Trigger finger, right ring finger: Secondary | ICD-10-CM | POA: Insufficient documentation

## 2012-07-05 DIAGNOSIS — K089 Disorder of teeth and supporting structures, unspecified: Secondary | ICD-10-CM

## 2012-07-05 DIAGNOSIS — M1710 Unilateral primary osteoarthritis, unspecified knee: Secondary | ICD-10-CM

## 2012-07-05 DIAGNOSIS — M6530 Trigger finger, unspecified finger: Secondary | ICD-10-CM

## 2012-07-05 LAB — POCT GLYCOSYLATED HEMOGLOBIN (HGB A1C): Hemoglobin A1C: 6.2

## 2012-07-05 MED ORDER — TRAMADOL-ACETAMINOPHEN 37.5-325 MG PO TABS: 1.0000 | ORAL_TABLET | Freq: Four times a day (QID) | ORAL | Status: DC | PRN

## 2012-07-05 NOTE — Assessment & Plan Note (Signed)
Last A1c in June. Will check today and adjust meds if needed at next follow-up.

## 2012-07-05 NOTE — Assessment & Plan Note (Addendum)
A: Degenerative arthritis with possible component of chondromalacia. Left knee pain worse with changes in weather and with activity. No great pain today but some crepitus and tenderness on exam. Injection in June helped but only for about one month.  P: Tylenol Arthritis for pain; continue to avoid NSAIDs for long periods given diabetes and HTN, for kidney protection. Discussed quadriceps strengthening exercises. Will refer to sports medicine, as well; per Dr. Zollie Pee note in June, guided injection may be more beneficial as pt is obese.

## 2012-07-05 NOTE — Assessment & Plan Note (Signed)
Currently waiting on dental referral to have an available appointment. Prescription for tramadol changed to tramadol-acetaminophen; pt filled Rx for tramadol alone and did not tolerate that particular formulation well, but used tramadol-acetaminophen formulation from daughter's prescription with better relief.

## 2012-07-05 NOTE — Progress Notes (Signed)
  Subjective:    Patient ID: Terri Estrada, female    DOB: 1951-02-06, 61 y.o.   MRN: [id number]  HPI: Pt is a 61yo female with PMH significant for HTN, DM type II, obesity, and degenerative arthritis of bilateral knees, presenting today with finger pain, knee, pain, and follow-up for tooth pain for about 2 months. Pt also requests a flu shot, today.  Finger pain - Present in right ring finger for about 1 year, "when I use my hand for stuff like writing for a long time or opening jars or things like that." Occasionally has locking of ring finger in flexed position requiring forced extension of joint using the other hand. Pain described as "hard to categorize," sharp and aching, worst when finger requires forced extension, with occasional burning across palm over MCP joints. Some relief with brace borrowed from a friend and with Tylenol Arthritis. Does not take NSAIDs "because of diabetes and HTN."  Knee pain - Chronic in bilateral knees, worse on left, pain with daily activities. Affects ability to exercise, though pt reports 8 lbs of weight loss and is optimistic about changes in her diet and exercise. Some relief in left knee from injection in June, but only for about 1 month before pain returned to same level as before. Not in significant pain today, but "worse when it gets cold." No catching/locking or collapse of knee joint. No falls. Some relief with Tylenol Arthritis.  Tooth pain - Still waiting on referral to dental clinic to have an available appointment. Tramadol prescribed by Dr. Armen Pickup in July was not well-tolerated; daughter's prescription of a different formulation (tramadol-acetaminophen) worked better/was tolerated better.  DM - Pt requests A1c check since it has been 4 months. Compliant with medications.  Review of Systems: As above.     Objective:   Physical Exam BP 143/84  Pulse 73  Ht 5\' 2"  (1.575 m)  Wt 208 lb 11.2 oz (94.666 kg)  BMI 38.17 kg/m2 Gen: elderly female,  pleasant, in NAD MSK:  Right hand: some tenderness to palpation over base of metacarpal of fourth digit, no distinct bulge in tendon palpated   Good grip strength, though significant pain when making a fist; no locking of joint in flexion during exam, today   No pain on extension, no weakness in digit adductors/abductors. No warmth or erythema of joints  Left knee: some tenderness along joint line, crepitus on passive ROM   Some pain with patella stabilized while pt fired quadriceps to extended knee  Pt able to stand/walk without assistance or instability     Assessment & Plan:

## 2012-07-05 NOTE — Patient Instructions (Addendum)
Thank you for coming into clinic, today! It was nice to meet you.  Finger pain/Knee pain - I think your finger problem is what is called a "trigger finger." You can continue to use your brace. I will refer you to the sports medicine clinic for both of these problems. In the meantime, you can use ibuprofen (Motrin, Advil, etc) 600 mg every 8 hours as needed. That should help cut down on the inflammation better than Tylenol. You can also do "low-resistance" strengthening exercises for the muscles in your thigh (like using an exercise bike at a slow-medium pace for 45 minutes).  Tooth pain - Continue to follow up with your referral to the dental clinic. I will send in a new prescription for tramadol-acetaminophen to your pharmacy.  We will check your A1c (three-month blood sugar average) today. Please make an appointment at your convenience to come back to see me to talk about your diabetes, cholesterol, and blood pressure. --Dr. Casper Harrison  Exercises: Straight leg raises - Sit on the edge of a chair or lie down on the back. Bend the opposite leg. Keep the affected leg perfectly straight and raise it 3 to 4 inches off the ground. Hold for 5 seconds. Repeat 10 to 15 times (one set). Perform a total of three sets. As your condition improves, perform straight leg raises with weights at the ankle; begin with a 2 pound weight and gradually increase to a 5 to 10 pound weight (pennies or fishing weights in an old sock, 2 cans in a purse, or Velcro ankle weights).  Hip abduction - Lie on your side on the bed or floor. The affected leg should be on top and should be held straight. The bottom leg should be bent. Hold the top leg straight and raise it 3 to 4 inches towards the ceiling. Hold for 5 seconds then slowly release. Repeat 10 to 15 times (one set). Perform a total of three sets. Be sure to avoid rolling forwards or backwards while lifting the leg.  Hip adduction - Lie on your side on the bed or floor. The  affected leg should be on bottom and should be held straight. The top leg should be bent with the foot placed in front of the bottom leg. Lift the bottom leg 3 to 4 inches. Hold for 5 seconds then slowly release. Repeat 10 to 15 times (one set). Perform a total of three sets.  Quarter squats - Stand 18 to 24 inches from a wall. Lean back against the wall. Bend both knees slightly (the buttocks should not be lower than the knees), keeping the back straight. Hold for five seconds then slowly stand up straight. Rest as needed. Repeat 10 to 15 times (one set). Perform a total of three sets. To increase the difficulty, bend the knees more deeply, hold for a longer time, and increase the speed. Alternately, use an exercise ball to perform squats. Stand up straight, holding the ball between your back and the wall. Slowly bend the knees and lower the back (roll the ball down the wall). Hold for a count of five. Stand up. Repeat 10 to 15 times.  Stretching exercises Hamstring stretch - Sit on the floor or bed with the affected leg extended straight out in front of you. The opposite leg may be bent or may hang off the bed. Keeping the affected leg straight, lean forward and reach for the ankle. Hold for 30 seconds but do not bounce. Sit up straight. Repeat 10  to 15 times.  Quad stretch - Stand behind a chair, holding the top of the chair with one hand. Bend the knee and grab the foot with the hand on the same side of the body. Stand up straight. Gently pull the foot towards the body. Hold for 30 seconds, holding constant pressure on the foot (do not pull-release-pull). Release the foot. Repeat 10 to 15 times.  Runner's stretch - Face a wall and stand 18 to 24 inches away. Place hands at head height and lean into the wall, keeping legs and back straight. You can rest your head on your hands, against the wall. You should feel a stretch in the muscles in the back of the calf. Hold for 30 seconds. Repeat 10 to 15  times.

## 2012-07-10 ENCOUNTER — Ambulatory Visit (INDEPENDENT_AMBULATORY_CARE_PROVIDER_SITE_OTHER): Payer: Self-pay | Admitting: Sports Medicine

## 2012-07-10 VITALS — BP 140/80 | Ht 62.0 in | Wt 208.0 lb

## 2012-07-10 DIAGNOSIS — M6530 Trigger finger, unspecified finger: Secondary | ICD-10-CM

## 2012-07-10 DIAGNOSIS — M25569 Pain in unspecified knee: Secondary | ICD-10-CM

## 2012-07-10 DIAGNOSIS — M25561 Pain in right knee: Secondary | ICD-10-CM

## 2012-07-10 NOTE — Progress Notes (Signed)
  Subjective:    Patient ID: Terri Estrada, female    DOB: 1951/05/17, 61 y.o.   MRN: [id number]  HPI Pt states that she has had pain at R ring finger x 1 year, however worse in the last 2 months.  States that the finger "locks up" on her, especially in the morning.  States that writing makes it worse and a " Hand brace" that her sister gave her makes it better.  Does not know what type of brace but describes a rigid volar plate on brace.  States that bilateral knee pain for many years.  Most recently had L knee joint injection at [facility] in June 2013.  States that L knee hurts worse than R.  R knee has been worse for about 1 year.  Walking hurts most and she often wears 3-4 pairs of socks b/c it feels more "cushiony."   Review of Systems All negative except as in HPI    Objective:   Physical Exam Well-developed, well-nourished. No acute distress. Awake alert and oriented x3  Right hand with attention to the right ring finger shows a palpable nodule in the area of the A1 pulley. She has active triggering of the digit with flexion and extension. No erythema. No soft tissue swelling. No evidence of Dupuytren's contracture. Neurovascular intact distally.  Bilateral knees show range of motion 0-120. 1+ synovitis. Diffuse tenderness but nothing focal. Good stability to ligamentous stressing. Neurovascular intact distally       Assessment & Plan:  1. Right ring finger trigger finger 2. Bilateral knee pain likely secondary to osteoarthritis  I discussed the possibility of a cortisone injection for her trigger finger but she wants to think about this first. I recommended a simple Band-Aid splint at the PIP joint at night to help prevent locking. I would like to obtain new standing x-rays of both of her knees. We will get those hopefully today she'll followup with me next week. We will reconsider merits of cortisone injection for trigger finger at that time.

## 2012-07-11 ENCOUNTER — Ambulatory Visit
Admission: RE | Admit: 2012-07-11 | Discharge: 2012-07-11 | Disposition: A | Discharge status: Home / Self Care | Payer: No Typology Code available for payment source | Source: Ambulatory Visit | Attending: Sports Medicine | Admitting: Sports Medicine

## 2012-07-11 ENCOUNTER — Other Ambulatory Visit: Payer: Self-pay | Admitting: Sports Medicine

## 2012-07-11 DIAGNOSIS — M25561 Pain in right knee: Secondary | ICD-10-CM

## 2012-07-11 DIAGNOSIS — M25562 Pain in left knee: Secondary | ICD-10-CM

## 2012-07-18 ENCOUNTER — Encounter: Payer: Self-pay | Admitting: Sports Medicine

## 2012-07-18 ENCOUNTER — Ambulatory Visit (INDEPENDENT_AMBULATORY_CARE_PROVIDER_SITE_OTHER): Payer: Self-pay | Admitting: Sports Medicine

## 2012-07-18 VITALS — BP 136/87 | HR 64 | Ht 62.0 in | Wt 208.0 lb

## 2012-07-18 DIAGNOSIS — M25569 Pain in unspecified knee: Secondary | ICD-10-CM

## 2012-07-18 DIAGNOSIS — M65341 Trigger finger, right ring finger: Secondary | ICD-10-CM

## 2012-07-18 DIAGNOSIS — M1710 Unilateral primary osteoarthritis, unspecified knee: Secondary | ICD-10-CM

## 2012-07-18 DIAGNOSIS — M6530 Trigger finger, unspecified finger: Secondary | ICD-10-CM

## 2012-07-18 DIAGNOSIS — M25562 Pain in left knee: Secondary | ICD-10-CM

## 2012-07-18 NOTE — Patient Instructions (Addendum)
Thank you for coming in today. Call or go to the ER if you develop a large red swollen joint with extreme pain or oozing puss.  Let us know how that finger and knees are doing.  Come back as needed.  Keep that finger moving.

## 2012-07-18 NOTE — Assessment & Plan Note (Signed)
Improve with conservative therapy. Patient declines injection today. Followup as needed

## 2012-07-18 NOTE — Assessment & Plan Note (Signed)
Chronic problem worsening Secondary to severe DJD of medial compartment Plan: Discussed options. Advised patient that she will likely need a full knee replacement at some point.  In the meantime we'll try conservative therapy. We'll use a corticosteroid injection today. Emphasize the importance of weight loss, quadricep strength, also advise the use of a cane in the right hand as needed.  Followup as needed

## 2012-07-18 NOTE — Progress Notes (Signed)
Terri Estrada is a 61 y.o. female who presents to [facility] today for  1) followup right ring trigger finger: Was seen one week ago.  Was offered injection. Patient declined at this time. Used a splint over the.  Patient notes improvement of pain and triggering.  She feels about 50% improved.  She does not want injection at this time. She denies any radiating pain weakness or numbness to.   2) bilateral knee pain left worse than right: Head standing x-rays one week ago.  She notes continued left knee pain without locking catching or giving way. Present throughout worse in the mornings and following and during exertion.  She most recently had a corticosteroid injection in June of 2013.  That worked for about 3-4 weeks.  No weakness numbness radiating pain difficulty walking. Feels well otherwise   PMH reviewed. Significant for diabetes History  Substance Use Topics  . Smoking status: Former Games developer  . Smokeless tobacco: Never Used   Comment: quit nov 2001  . Alcohol Use: No   ROS as above otherwise neg: No fevers or chills. Has intentionally lost about 80 pounds over the last few years   Exam:  BP 136/87  Pulse 64  Ht 5\' 2"  (1.575 m)  Wt 208 lb (94.348 kg)  BMI 38.04 kg/m2 Gen: Well NAD MSK: Right hand: Normal-appearing nontender over ring MCP joint. Normal ring finger motion. No triggering. Normal strength sensation and capillary.  Left knee: Normal-appearing no effusion. Range of motion 0-120. Positive patellar crepitations on extension Negative Lachman's McMurray's valgus and varus stress.  Mildly tender over medial joint line and patella.   X-rays: Reviewed standing 3 views bilaterally significant for severe bone-on-bone medial compartment DJD on left and moderate bone-on-bone DJD on right of the medial upper Full summary below Dg Knee Ap/lat W/sunrise Left   Knee injection. Consent obtained and timeout performed. Patient seated in a comfortable position with legs hanging off the  table.  The lateral Peri-patellar tendon space was palpated and marked. The skin was then cleaned with alcohol. Cold spray applied. A 25-gauge inch and a half needle was used to inject 40 mg of Depo-Medrol and 4 mL of 0.5% Marcaine. Patient tolerated procedure well no bleeding. Pain improved following injection   07/11/2012  *RADIOLOGY REPORT*  Clinical Data: Chronic bilateral knee pain  DG KNEE - 3 VIEWS  Comparison: Concurrently obtained radiographs of the right knee  Findings: Advanced tricompartmental osteoarthritis most severe in the medial compartment where there is complete loss of joint space, bone on bone contact, and genu varus angulation of the knee. Likely suprapatellar joint effusion.  No acute fracture.  IMPRESSION:  1. Advanced tricompartmental osteoarthritis most severe in the medial compartment where there is complete loss of joint space, and bone on bone contact.  2.  Genu varus angulation of the knee, likely secondary and related to narrowing of the medial joint space.  3.  Small joint effusion.   Original Report Authenticated By: Alvino Blood Knee Ap/lat W/sunrise Right  07/11/2012  *RADIOLOGY REPORT*  Clinical Data: Bilateral knee pain  DG KNEE - 3 VIEWS  Comparison: Prior radiographs of the right knee 08/26/2011; contemporaneously obtained radiographs of the left the day today 07/11/2012  Findings: Similar appearance of tricompartmental osteoarthritis most significant in the medial patellofemoral compartment.  No significant interval change compared to 08/26/2011.  No acute fracture, or malalignment.  There may be a small suprapatellar joint effusion.  IMPRESSION: Tricompartmental osteoarthritis most severe in the  medial and patellofemoral compartments without significant interval change compared to 08/26/2011   Original Report Authenticated By: Vilma Prader

## 2012-08-19 ENCOUNTER — Other Ambulatory Visit: Payer: Self-pay | Admitting: *Deleted

## 2012-08-19 DIAGNOSIS — E785 Hyperlipidemia, unspecified: Secondary | ICD-10-CM

## 2012-08-19 MED ORDER — ATORVASTATIN CALCIUM 80 MG PO TABS: 80.0000 mg | ORAL_TABLET | Freq: Every day | ORAL | Status: DC

## 2012-08-29 ENCOUNTER — Encounter: Payer: Self-pay | Admitting: Family Medicine

## 2012-09-09 ENCOUNTER — Encounter: Payer: Self-pay | Admitting: Family Medicine

## 2012-09-09 ENCOUNTER — Ambulatory Visit (INDEPENDENT_AMBULATORY_CARE_PROVIDER_SITE_OTHER): Payer: Self-pay | Admitting: Family Medicine

## 2012-09-09 VITALS — BP 152/100 | HR 91 | Temp 98.0°F | Ht 62.0 in | Wt 206.9 lb

## 2012-09-09 DIAGNOSIS — E669 Obesity, unspecified: Secondary | ICD-10-CM

## 2012-09-09 DIAGNOSIS — K089 Disorder of teeth and supporting structures, unspecified: Secondary | ICD-10-CM

## 2012-09-09 DIAGNOSIS — E785 Hyperlipidemia, unspecified: Secondary | ICD-10-CM

## 2012-09-09 DIAGNOSIS — M6530 Trigger finger, unspecified finger: Secondary | ICD-10-CM

## 2012-09-09 DIAGNOSIS — E119 Type 2 diabetes mellitus without complications: Secondary | ICD-10-CM

## 2012-09-09 DIAGNOSIS — M25562 Pain in left knee: Secondary | ICD-10-CM

## 2012-09-09 DIAGNOSIS — I10 Essential (primary) hypertension: Secondary | ICD-10-CM

## 2012-09-09 DIAGNOSIS — K0889 Other specified disorders of teeth and supporting structures: Secondary | ICD-10-CM

## 2012-09-09 DIAGNOSIS — Z23 Encounter for immunization: Secondary | ICD-10-CM

## 2012-09-09 DIAGNOSIS — M65341 Trigger finger, right ring finger: Secondary | ICD-10-CM

## 2012-09-09 DIAGNOSIS — M25569 Pain in unspecified knee: Secondary | ICD-10-CM

## 2012-09-09 DIAGNOSIS — Z0000 Encounter for general adult medical examination without abnormal findings: Secondary | ICD-10-CM

## 2012-09-09 DIAGNOSIS — M1710 Unilateral primary osteoarthritis, unspecified knee: Secondary | ICD-10-CM

## 2012-09-09 NOTE — Assessment & Plan Note (Signed)
A: Compliant with metformin. Denies hypoglycemic events. Last A1c in Sept was 6.1. P: Routine follow-up. Will recheck A1c in the next few months.

## 2012-09-09 NOTE — Assessment & Plan Note (Signed)
No current complaints. Occasionally uses tramadol for pain. To follow up with dental clinic next week.

## 2012-09-09 NOTE — Assessment & Plan Note (Signed)
A: BP elevated today, but pt has been without metoprolol. P: Instructed pt to f/u with [facility] for refill. Otherwise continue meds and monitor at f/u.

## 2012-09-09 NOTE — Assessment & Plan Note (Signed)
Doing well with conservative therapy; declined injection 07/18/2012. Follow up as eneded.

## 2012-09-09 NOTE — Assessment & Plan Note (Addendum)
Corticosteroid injection on 10/10 with good relief. Instructed pt to continue with quadriceps exercises. To follow up with sports medicine as needed.

## 2012-09-09 NOTE — Assessment & Plan Note (Signed)
Tdap and pneumococcal vaccine given today. Discussed Zoster vaccine, currently declined due to cost consideration. Recommended pt speak to office staff about assistance with applying for insurance through health care exchanges. Pt to return to clinic for appointment specifically for pap smear.

## 2012-09-09 NOTE — Assessment & Plan Note (Signed)
Counseled pt on continued exercise/diet improvements.

## 2012-09-09 NOTE — Progress Notes (Signed)
  Subjective:    Patient ID: Terri Estrada, female    DOB: December 29, 1950, 61 y.o.   MRN: [id number]  HPI: Pt presents to clinic today for yearly physical and follow-up.  DM: Reports compliance with medications (metformin) but does not check CBG's regularly at home. Denies hypoglycemic episodes. No polyuria/polydipsia, increased urinary frequency/urge.  HTN: Compliant with medications. Does not regularly check BP at home or at pharmacy/etc. Is currently out of metoprolol, that she gets through [facility]. No headache/change in vision.  Knee pain/trigger finger: Knee shot in the left knee in September with good relief. Conservative management for trigger finger, with improvement. Tramadol occasionally   Tooth pain: Pt has appointment with dental clinic on Monday.  Maintenance: Pt has had a pap smear in the past two years. Colonoscopy reported in the last 4-5 years but unsure of date. Last Tdap was 10 years ago. Never had Zoster vaccination or pneumococcal vaccine.  Review of Systems: As above.     Objective:   Physical Exam BP 152/100  Pulse 91  Temp 98 F (36.7 C) (Oral)  Ht 5\' 2"  (1.575 m)  Wt 206 lb 14.4 oz (93.849 kg)  BMI 37.84 kg/m2 Gen: elderly female in NAD, pleasant and cooperative Pulm: CTAB, no wheezes Cardio: RRR, normal S1, S2, no murmur appreciated Abd: Soft, nontender, no guarding or masses, BS+ Ext: warm, well-perfused, distal pulses 2+ and symmetric DM foot exam: normal monofilament testing, callous to left great toe MTP joint     Assessment & Plan:

## 2012-09-09 NOTE — Assessment & Plan Note (Signed)
A: Seen recently at sports medicine and received knee injection. Good relief since then. Doing well with quadriceps exercises. P: Continue Tramadol as needed. F/u here vs with sports medicine as needed.

## 2012-09-09 NOTE — Patient Instructions (Signed)
Thank you for coming in, today! I'm glad to see you're doing well. Continue your medications as prescribed. Let me know if you need refills. Keep your appointment with the dental clinic. Follow up with sports medicine for your finger or knees, as you need. Schedule an appointment at your convenience to come back for a pap smear. --Dr. Casper Harrison

## 2012-09-09 NOTE — Assessment & Plan Note (Signed)
A: On lipitor, reports compliance. Last LDL and CMP in June unremarkable. P: Continue lipitor. Will likely check LDL and CMP in the next half-year.

## 2012-09-19 ENCOUNTER — Other Ambulatory Visit: Payer: Self-pay | Admitting: Family Medicine

## 2012-09-19 MED ORDER — OLMESARTAN MEDOXOMIL-HCTZ 40-12.5 MG PO TABS: 1.0000 | ORAL_TABLET | Freq: Every day | ORAL | Status: DC

## 2012-10-25 ENCOUNTER — Other Ambulatory Visit: Payer: Self-pay | Admitting: Family Medicine

## 2012-10-25 DIAGNOSIS — Z1231 Encounter for screening mammogram for malignant neoplasm of breast: Secondary | ICD-10-CM

## 2012-11-11 ENCOUNTER — Ambulatory Visit (HOSPITAL_COMMUNITY): Payer: No Typology Code available for payment source

## 2012-11-14 ENCOUNTER — Encounter: Payer: Self-pay | Admitting: Family Medicine

## 2012-11-14 ENCOUNTER — Ambulatory Visit (INDEPENDENT_AMBULATORY_CARE_PROVIDER_SITE_OTHER): Payer: No Typology Code available for payment source | Admitting: Family Medicine

## 2012-11-14 VITALS — BP 134/80 | HR 64 | Ht 62.0 in | Wt 209.0 lb

## 2012-11-14 DIAGNOSIS — Z0000 Encounter for general adult medical examination without abnormal findings: Secondary | ICD-10-CM

## 2012-11-14 DIAGNOSIS — K089 Disorder of teeth and supporting structures, unspecified: Secondary | ICD-10-CM

## 2012-11-14 DIAGNOSIS — M25562 Pain in left knee: Secondary | ICD-10-CM

## 2012-11-14 DIAGNOSIS — K0889 Other specified disorders of teeth and supporting structures: Secondary | ICD-10-CM

## 2012-11-14 DIAGNOSIS — M25569 Pain in unspecified knee: Secondary | ICD-10-CM

## 2012-11-14 DIAGNOSIS — E119 Type 2 diabetes mellitus without complications: Secondary | ICD-10-CM

## 2012-11-14 DIAGNOSIS — E785 Hyperlipidemia, unspecified: Secondary | ICD-10-CM

## 2012-11-14 DIAGNOSIS — I10 Essential (primary) hypertension: Secondary | ICD-10-CM

## 2012-11-14 LAB — POCT GLYCOSYLATED HEMOGLOBIN (HGB A1C): Hemoglobin A1C: 6.2

## 2012-11-14 MED ORDER — ISOSORBIDE MONONITRATE ER 30 MG PO TB24: 30.0000 mg | ORAL_TABLET | Freq: Every day | ORAL | Status: DC

## 2012-11-14 MED ORDER — METOPROLOL TARTRATE 25 MG PO TABS: 25.0000 mg | ORAL_TABLET | Freq: Two times a day (BID) | ORAL | Status: DC

## 2012-11-14 NOTE — Assessment & Plan Note (Signed)
A: A1c is 6.2 today. Reports compliance with metformin.  P: Continue metformin. Routine follow-up and recheck of A1c in ~3 months.

## 2012-11-14 NOTE — Assessment & Plan Note (Signed)
A: Appears well-controlled with current medications. Episode described in HPI may have been related to hypotension (or hypoglycemia), but uncertain. No definite further work-up planned at this point (discussed with Dr. Deirdre Priest; likely increased risk of elevated BP and/or angina with discontinuing meds, pt not likely to be orthostatic, and other measures/work-up would depend on chronicity of symptoms, etc).  P: Instructed pt to stop taking old metoprolol pills and provided Rx's for metoprolol 25 mg BID and generic Imdur 30 mg daily. Instructed pt to schedule a follow-up appointment (see health maintenance) but to return sooner if she continues to have similar episodes to the one described above. Instructed pt to check BP and blood sugar if any similar episodes occur, as well.

## 2012-11-14 NOTE — Assessment & Plan Note (Signed)
Improving. Following up with dental clinic.

## 2012-11-14 NOTE — Patient Instructions (Signed)
Thank you for coming in, today! It was nice to see you. I wrote you a prescription for metoprolol 25 mg twice per day. Don't take your old 100 mg pills. I also wrote a prescription for isosorbide, the generic for Imdur. Take 30 mg once per day. I want you to make an appointment to come in for a lab draw in the next few weeks. Don't eat or drink anything except for water for 8 hours before this appointment. This appointment can be in the morning, so you can eat, afterwards. You can make an appointment to get a pap smear, at the same time.  If you have any symptoms like you did yesterday, you can come back to see me or someone else sooner. Keep your appointments with your dentist and follow up at the sports medicine clinic, if you need. Call me with any questions or concerns.  --Dr. Casper Harrison

## 2012-11-14 NOTE — Assessment & Plan Note (Signed)
Generally improved with occasional NSAIDs, quadriceps exercises, etc. Pt to follow up with sports medicine as needed; pt considering requesting another steroid injection (last on 07/18/2013), but states "it's not quite that bad, yet."

## 2012-11-14 NOTE — Assessment & Plan Note (Signed)
Instructed pt to schedule a visit specifically for Pap smear. Likely will also do planned fasting lab draw at that time.

## 2012-11-14 NOTE — Progress Notes (Signed)
  Subjective:    Patient ID: Terri Estrada, female    DOB: May 24, 1951, 62 y.o.   MRN: [id number]  HPI: Pt presents for follow-up and refill of hypertension medications. Pt reports she ran out of Toprol-XL 25 mg tablets about three weeks ago and has been taking old regular metoprolol 100 mg tablets once a day for about three weeks. Pt is also out of Imdur. She cannot get these two medications through [facility]; she can get generic regular metoprolol and generic isosorbide dinitrate, but needs prescriptions for them.  Pt does describe an episode yesterday of feeling "very tired/sleepy" with a little nausea, lasting from around 0930 to 1630. She called her husband in the afternoon and he told her to eat something with sugar in it, so she ate some candy without any improvement of symptoms, then some crackers. She began to feel better after eating, but states she doesn't think it had anything to do with food because she ate breakfast and lunch as normal, during the time she wasn't feeling well. Denies shakiness/dizziness/sweating during the episode. Pt did not check her blood sugar or blood pressure. Reports she did take metformin 100 mg yesterday morning but as above has been taking it for a few weeks, and also took it today without any similar symptoms.  Otherwise pt reports she is doing well. She states she is compliant with her meds and states she has felt well. She was glad to see her blood pressure looks okay today. She has continued appointments with her dentist and has had no further dental pain. Pt states her knee pain has been better with exercises discussed previously and states her knee "hasn't bothered her enough to need to go back to sports medicine," yet, but that she will follow up with them if she needs.  Review of Systems: As above. No active chest pain or SOB. No abdominal pain.     Objective:   Physical Exam BP 134/80  Pulse 64  Ht 5\' 2"  (1.575 m)  Wt 209 lb (94.802 kg)  BMI 38.23  kg/m2 Gen: adult female in NAD, pleasant and cooperative HEENT: Reform/AT, EOMI, sclerae/conjunctivae clear Pulm: CTAB, no increased work of breathing, no wheezes Cardio: RRR, no murmur appreciated Abd: soft, nontender, BS+ Ext: 5/5 strength throughout, no tenderness, distal pulses intact/symmetric; stands/ambulates without assistance     Assessment & Plan:

## 2012-11-14 NOTE — Assessment & Plan Note (Signed)
A: Compliant with Lipitor. Last LDL and CMP almost one year ago, and pt would like to recheck.  P: Continue Lipitor. Future orders placed for fasting lipid panel and CMP; pt to schedule lab draw appointment.

## 2012-11-19 ENCOUNTER — Ambulatory Visit (HOSPITAL_COMMUNITY)
Admission: RE | Admit: 2012-11-19 | Discharge: 2012-11-19 | Disposition: A | Discharge status: Home / Self Care | Payer: No Typology Code available for payment source | Source: Ambulatory Visit | Attending: Family Medicine | Admitting: Family Medicine

## 2012-11-19 DIAGNOSIS — Z1231 Encounter for screening mammogram for malignant neoplasm of breast: Secondary | ICD-10-CM

## 2012-12-17 ENCOUNTER — Other Ambulatory Visit (HOSPITAL_COMMUNITY)
Admission: RE | Admit: 2012-12-17 | Discharge: 2012-12-17 | Disposition: A | Discharge status: Home / Self Care | Payer: No Typology Code available for payment source | Source: Ambulatory Visit | Attending: Family Medicine | Admitting: Family Medicine

## 2012-12-17 ENCOUNTER — Encounter: Payer: Self-pay | Admitting: Family Medicine

## 2012-12-17 ENCOUNTER — Ambulatory Visit (INDEPENDENT_AMBULATORY_CARE_PROVIDER_SITE_OTHER): Payer: No Typology Code available for payment source | Admitting: Family Medicine

## 2012-12-17 VITALS — BP 142/59 | HR 88 | Temp 98.3°F | Ht 62.0 in | Wt 209.1 lb

## 2012-12-17 DIAGNOSIS — Z124 Encounter for screening for malignant neoplasm of cervix: Secondary | ICD-10-CM

## 2012-12-17 DIAGNOSIS — I10 Essential (primary) hypertension: Secondary | ICD-10-CM

## 2012-12-17 DIAGNOSIS — Z01419 Encounter for gynecological examination (general) (routine) without abnormal findings: Secondary | ICD-10-CM

## 2012-12-17 DIAGNOSIS — E785 Hyperlipidemia, unspecified: Secondary | ICD-10-CM

## 2012-12-17 DIAGNOSIS — Z0000 Encounter for general adult medical examination without abnormal findings: Secondary | ICD-10-CM | POA: Insufficient documentation

## 2012-12-17 DIAGNOSIS — Z111 Encounter for screening for respiratory tuberculosis: Secondary | ICD-10-CM

## 2012-12-17 LAB — LIPID PANEL
HDL: 50 mg/dL (ref >39)
LDL Cholesterol: 68 mg/dL (ref 0–99)
Total CHOL/HDL Ratio: 2.7 Ratio
Triglycerides: 75 mg/dL (ref <150)

## 2012-12-17 LAB — COMPREHENSIVE METABOLIC PANEL
Alkaline Phosphatase: 91 U/L (ref 39–117)
CO2: 31 mEq/L (ref 19–32)
Creat: 0.62 mg/dL (ref 0.50–1.10)
Glucose, Bld: 100 mg/dL — ABNORMAL HIGH (ref 70–99)
Sodium: 140 mEq/L (ref 135–145)
Total Bilirubin: 0.4 mg/dL (ref 0.3–1.2)
Total Protein: 7.1 g/dL (ref 6.0–8.3)

## 2012-12-17 MED ORDER — ISOSORBIDE MONONITRATE ER 30 MG PO TB24: 30.0000 mg | ORAL_TABLET | Freq: Every day | ORAL | Status: DC

## 2012-12-17 MED ORDER — METOPROLOL TARTRATE 25 MG PO TABS: 25.0000 mg | ORAL_TABLET | Freq: Two times a day (BID) | ORAL | Status: DC

## 2012-12-17 NOTE — Assessment & Plan Note (Signed)
A: Well-controlled with current meds. Requests refill for 3 months at a time due to cost (can get 90 day supply for same price as 30 day supply, if written to dispense 90 days' worth).  P: Rx's changed and provided to pt on paper. No change. Routine f/u.

## 2012-12-17 NOTE — Patient Instructions (Addendum)
Thank you for coming in, today! I will call you and/or send you a letter with the results of your pap smear and labs. We'll set up a follow-up appointment at that time, probably about a month or two after I contact you. I have written your isosorbide and metoprolol prescriptions to be filled for a 90 day supply. We'll also set you up for a TB test. If you have any questions or problems in the meantime, please don't hesitate to call at any time. --Dr. Casper Harrison

## 2012-12-17 NOTE — Progress Notes (Signed)
  Subjective:    Patient ID: Terri Estrada, female    DOB: 12-25-1950, 62 y.o.   MRN: [id number]  HPI: Pt presents to clinic for lab draw and pap smear. Pt reports no problems or concerns since last visit. Pt does request Rx change of metoprolol and isosorbide dinitrate as 23-month supply fills due to cost concerns. Pt also requests TB test.   Reports compliance with medications and states she has been "doing well" with her blood pressure checks at home; last check this morning was "about 130 over 80-something." No new muscle/body aches on Lipitor.  Review of Systems: No fever, chills, N/V/D. No headaches or change in vision. No dizziness or falls. No cough/chest pain/SOB.     Objective:   Physical Exam BP 142/59  Pulse 88  Temp(Src) 98.3 F (36.8 C) (Oral)  Ht 5\' 2"  (1.575 m)  Wt 209 lb 1.6 oz (94.847 kg)  BMI 38.24 kg/m2 Gen: elderly female in NAD, pleasant and cooperative HEENT: EOMI, neck supple Cardio: RRR, no murmur appreciated Pulm: CTAB, no wheezes GU: external genitalia normal, no vaginal or cervical discharge; slight friability of cervix on manipulation for pap smear but no frank discharge  No cervical motion tenderness or adnexal masses appreciated on bimanual exam     Assessment & Plan:

## 2012-12-17 NOTE — Assessment & Plan Note (Signed)
A: Compliant with Lipitor. No new muscle/body aches.  P: Continue Lipitor. Fasting lipid panel and CMP today. Will follow up with lab results and results of pap, done today (see problem list note(s)).

## 2012-12-17 NOTE — Assessment & Plan Note (Signed)
Pap smear done today. Will follow up with results of this and labs drawn today, and plan to follow up in 1-2 months.

## 2012-12-20 ENCOUNTER — Ambulatory Visit (INDEPENDENT_AMBULATORY_CARE_PROVIDER_SITE_OTHER): Payer: No Typology Code available for payment source | Admitting: *Deleted

## 2012-12-20 DIAGNOSIS — Z111 Encounter for screening for respiratory tuberculosis: Secondary | ICD-10-CM

## 2012-12-20 LAB — TB SKIN TEST: TB Skin Test: NEGATIVE

## 2012-12-23 ENCOUNTER — Telehealth: Payer: Self-pay | Admitting: Family Medicine

## 2012-12-23 NOTE — Telephone Encounter (Signed)
Called pt to discuss pap results (negative, HPV not tested), lipid panel (LDL 68), and CMP (Cr and BUN nl, AST nl, ALT minimally elevated). Pt appreciative to hear of good test results. Plan to continue Lipitor. Discussed pap screening recommendations; with negative pap and no HPV test, would favor repeat pap in 3 years but pt was under the impression that she could have one at 61, then the repeat would be at 66 and she would thus be able to have this as her last pap smear. Will continue to discuss in the future but anticipate that this pap will be the last she will have done; in addition to all negatives in the past, I think the risk of her developing cervical cancer is relatively low. Plan to follow up in June or July for routine A1c check, BP check, etc. --CMS

## 2013-01-13 ENCOUNTER — Other Ambulatory Visit: Payer: Self-pay | Admitting: Family Medicine

## 2013-04-10 ENCOUNTER — Encounter: Payer: Self-pay | Admitting: Family Medicine

## 2013-04-10 ENCOUNTER — Ambulatory Visit (INDEPENDENT_AMBULATORY_CARE_PROVIDER_SITE_OTHER): Payer: No Typology Code available for payment source | Admitting: Family Medicine

## 2013-04-10 VITALS — BP 150/90 | HR 50 | Ht 62.0 in | Wt 204.0 lb

## 2013-04-10 DIAGNOSIS — I10 Essential (primary) hypertension: Secondary | ICD-10-CM

## 2013-04-10 DIAGNOSIS — E785 Hyperlipidemia, unspecified: Secondary | ICD-10-CM

## 2013-04-10 DIAGNOSIS — E119 Type 2 diabetes mellitus without complications: Secondary | ICD-10-CM

## 2013-04-10 NOTE — Patient Instructions (Signed)
Thank you for coming in, today! Please call the pharmacies you mentioned to check prices on drugs. Call me back to let me know which pharmacy you want use. I'll send in any new prescriptions that are appropriate. If we change any meds, we might do some lab work in the next few months. Your last lipid panel in March showed a good cholesterol (HDL) of 50 (goal >55), bad cholesterol (LDL) 68 (goal <100), and triglycerides 75 (goal <150). Your A1c today was 6.0. We'll make an appointment to see me again when we figure out your prescriptions. Please feel free to call with any questions or concerns at any time, at [phone]. --Dr. Casper Harrison

## 2013-04-14 ENCOUNTER — Encounter: Payer: Self-pay | Admitting: Family Medicine

## 2013-04-14 NOTE — Assessment & Plan Note (Signed)
A: Well-controlled with metformin, diet/exercise improvements.  P: Continue metformin. Routine follow-up, A1c in ~3 months.

## 2013-04-14 NOTE — Assessment & Plan Note (Signed)
A: Compliant with Lipitor, no new side effects. Last lipid panel in March with LDL 68. Requests change to a different medication, but would like to check prices at different pharmacies.  P: Would prefer to stay with Lipitor as pt clearly tolerates it and has had good numbers on lipid panel. Will follow up with pt on pharmacy choice(s) and make medication adjustments as needed.

## 2013-04-14 NOTE — Assessment & Plan Note (Signed)
A: Elevated this morning, but pt has not taken her medications, yet. Requests refill on amlodipine, Benicar-HCTZ, but wants to check prices at various pharmacies (was previously part of the MAP program/pharmacy discount program but meds no longer available through those avenues).  P: Pt to call back with decision on which pharmacies to use. Will make adjustments to medications as necessary; otherwise continue as before and follow up as needed. If medication changes prove difficult or if agent choice is not simple (if pt must switch away from Benicar, especially), will consider Rx clinic referral for assistance.

## 2013-04-14 NOTE — Progress Notes (Signed)
  Subjective:    Patient ID: [name], female    DOB: 1950-11-04, 62 y.o.   MRN: [id number]  HPI: Pt presents to clinic to f/u on diabetes, HTN, and HLD. Pt requests refills of several medications.  DM - Last A1c 6.0. Pt very pleased to hear 6.0, today. Reports using an exercise bike and making "improvements" to her diet (less fats, more baked foods, more vegetables, etc). Compliant with metformin, no new side effects.  HTN - Compliant with medications. Reports she has not taken them, today. Requests refill on Norvasc and Benicar-HCTZ but wants to check different pharmacies for better prices, especially on Benicar. No new headaches/change in vision/neuro changes.  HLD - Compliant with Lipitor but requests change to a cheaper medication and wants to check different pharmacies, as above. No new muscle aches, stomach upset.  Pt is a former smoker. In addition to the above documentation, pt's PMH, surgical history, FH, and SH all reviewed and updated where appropriate in the EMR. I have also reviewed and updated the pt's allergies and current medications as appropriate.  Review of Systems: As above. Otherwise, full 12-system ROS was reviewed and all negative.     Objective:   Physical Exam BP 150/90  Pulse 50  Ht 5\' 2"  (1.575 m)  Wt 204 lb (92.534 kg)  BMI 37.3 kg/m2 Gen: well-appearing adult female in NAD HEENT: [place]/AT, sclerae/conjunctivae clear, no lid lag, EOMI, PERRLA   MMM, posterior oropharynx clear, no cervical lymphadenopathy  neck supple with full ROM, no masses appreciated; thyroid not enlarged  Cardio: RRR, no murmur appreciated; distal pulses intact/symmetric Pulm: CTAB, no wheezes, normal WOB  Abd: soft, nondistended, BS+, no HSM Ext: warm/well-perfused, no cyanosis/clubbing/edema MSK: strength 5/5 in all four extremities, no frank joint deformity/effusion;   normal ROM to all four extremities with no point muscle/bony tenderness in spine Neuro/Psych: alert/oriented,  sensation grossly intact; normal gait/balance  mood euthymic with congruent affect     Assessment & Plan:

## 2013-04-17 ENCOUNTER — Other Ambulatory Visit: Payer: Self-pay

## 2013-04-18 ENCOUNTER — Telehealth: Payer: Self-pay | Admitting: Family Medicine

## 2013-04-18 NOTE — Telephone Encounter (Signed)
Patient would like Dr. Casper Harrison to call her to discuss her medications.

## 2013-04-18 NOTE — Telephone Encounter (Signed)
Please get more info, what med, what ??s, etc

## 2013-05-01 ENCOUNTER — Telehealth: Payer: Self-pay | Admitting: Family Medicine

## 2013-05-01 DIAGNOSIS — E785 Hyperlipidemia, unspecified: Secondary | ICD-10-CM

## 2013-05-01 DIAGNOSIS — I10 Essential (primary) hypertension: Secondary | ICD-10-CM

## 2013-05-01 MED ORDER — ATORVASTATIN CALCIUM 80 MG PO TABS: 80.0000 mg | ORAL_TABLET | Freq: Every day | ORAL | Status: DC

## 2013-05-01 MED ORDER — AMLODIPINE BESYLATE 10 MG PO TABS: 10.0000 mg | ORAL_TABLET | Freq: Every day | ORAL | Status: DC

## 2013-05-01 MED ORDER — HYDROCHLOROTHIAZIDE 25 MG PO TABS: 25.0000 mg | ORAL_TABLET | Freq: Every day | ORAL | Status: DC

## 2013-05-01 NOTE — Telephone Encounter (Signed)
Discussed with patient by phone. See also my last outpt note with pt. Will plan to refill generic Norvasc and Lipitor for 90 day supply (pt to use [facility] one and [facility] the other, per her preference). Pt unable to afford Benicar and no similar combination on discount lists, so will provide Rx for HCTZ 25 mg alone, then readdress BP medications at next follow-up (would have switched ARB-HCTZ to ACE-HCTZ, but pt with documented intolerance to ACE). Rx's printed and left at front desk for pt to pick up tomorrow. Thanks! --CMS

## 2013-05-01 NOTE — Telephone Encounter (Signed)
Will fwd. To PCP for refills. Thanks. .Terri Estrada  

## 2013-05-01 NOTE — Telephone Encounter (Signed)
Patient called on 7/11 about meds and never got a return call. She states she would like to get the generic rx for Norvasc & Lipitor. Also would like to discuss issue with Benicar. Pls call patient.

## 2013-06-05 ENCOUNTER — Ambulatory Visit: Payer: Self-pay | Admitting: Cardiology

## 2013-07-08 ENCOUNTER — Ambulatory Visit: Payer: Self-pay

## 2013-09-03 ENCOUNTER — Ambulatory Visit: Payer: Self-pay | Admitting: Cardiology

## 2013-09-03 ENCOUNTER — Ambulatory Visit (INDEPENDENT_AMBULATORY_CARE_PROVIDER_SITE_OTHER): Payer: Self-pay | Admitting: Family Medicine

## 2013-09-03 ENCOUNTER — Encounter: Payer: Self-pay | Admitting: Family Medicine

## 2013-09-03 VITALS — BP 140/78 | HR 71 | Temp 98.1°F | Ht 62.0 in | Wt 207.2 lb

## 2013-09-03 DIAGNOSIS — I10 Essential (primary) hypertension: Secondary | ICD-10-CM

## 2013-09-03 DIAGNOSIS — E785 Hyperlipidemia, unspecified: Secondary | ICD-10-CM

## 2013-09-03 DIAGNOSIS — Z0000 Encounter for general adult medical examination without abnormal findings: Secondary | ICD-10-CM

## 2013-09-03 DIAGNOSIS — E119 Type 2 diabetes mellitus without complications: Secondary | ICD-10-CM

## 2013-09-03 NOTE — Assessment & Plan Note (Signed)
Up to date on mammogram and pap smear. Given information on setting up a colonoscopy. Flu shot given today.

## 2013-09-03 NOTE — Assessment & Plan Note (Signed)
A: Compliant with metformin, remains doing well with diet/exercise improved habits. Not checking CBG's at home, not on insulin. Denies hypoglycemic episodes. Diabetic foot exam unremarkable other than right great toenail with folded over appearance. Declines referral to podiatry and eye scan today due to no insurance.   P: Continue metformin. Routine f/u. Recheck A1c in about 3 months, likely refer to podiatry and do eye scan at that time.Marland Kitchen

## 2013-09-03 NOTE — Patient Instructions (Signed)
Thank you for coming in, today!  For your diabetes: Your A1c is 6.2, which is great! Keep taking your metformin without any changes.  For your blood pressure: Your top number was 140 today, and your goal is less than 140.  Keep taking the medicines you're on. Go to different pharmacies and find out if you can afford one of the medicines below: These are "ARB" medicines (angiotensin receptor blockers) -olmesartan (Benicar, the medicine you were on before) -losartan -candesartan -valsartan -other "-sartan" drugs  Otherwise, keep taking your medicines without any changes. I will give you information on how to schedule a colonoscopy. We will take a picture of your eyes today. Come back to see me in 3-6 months or sooner if you need.  Please feel free to call with any questions or concerns at any time, at [phone]. --Dr. Casper Harrison

## 2013-09-03 NOTE — Assessment & Plan Note (Signed)
A: Mildly elevated today, but reports compliance with amlodipine, HCTZ, metoprolol. Has not checked pharmacies for pricing on ARB's (previously on Benicar, unable to afford combo pill with HCTZ at [facility]).  P: Continue current meds / doses. Consider increase at follow-up vs re-addition of ARB if pt is able to find a pharmacy/drug which she can afford (would favor Benicar as she tolerated it well). Consider recheck kidney function at next visit. F/u in 3-4 months.

## 2013-09-03 NOTE — Progress Notes (Signed)
   Subjective:    Patient ID: Terri Estrada, female    DOB: 31-Jan-1951, 62 y.o.   MRN: [id number]  HPI: Pt presents to clinic for general follow-up.  DM - A1c today is 6.2. Does not check sugar at home. -pt reports compliance with metformin, denies hypoglycemic episodes -occasional stomach upset / nausea with evening dose of metformin but doesn't always take it with food -needs eye exam  HLD - reports compliance with Lipitor, able to afford it currently -denies side effects, no muscle aches/leg pain  HTN - reports compliance with HCTZ, amlodipine, metoprolol -previously on Benicar-HCTZ, but unable to afford and can't tolerate ACEi's -denies headache/change in vision, LE swelling  Health maintenance -wants flu shot today -pap smear and mammogram earlier this year -is due for colonoscopy  Pt is a fomer smoker. In addition to the above documentation, pt's PMH, surgical history, FH, and SH all reviewed and updated where appropriate in the EMR. I have also reviewed and updated the pt's allergies and current medications as appropriate.  Review of Systems: As above. PHQ-2 negative. Otherwise, full 12-system ROS was reviewed and all negative.     Objective:   Physical Exam BP 140/78  Pulse 71  Temp(Src) 98.1 F (36.7 C) (Oral)  Ht 5\' 2"  (1.575 m)  Wt 207 lb 3.2 oz (93.985 kg)  BMI 37.89 kg/m2 Gen: well-appearing adult female in NAD HEENT: [place]/AT, sclerae/conjunctivae clear, no lid lag, EOMI, PERRLA   MMM, posterior oropharynx clear, no cervical lymphadenopathy  neck supple with full ROM, no masses appreciated; thyroid not enlarged  Cardio: RRR, blowing systolic murmur appreciated (not new); distal pulses intact/symmetric Pulm: CTAB, no wheezes, normal WOB  Abd: soft, nondistended, BS+, no HSM Ext: warm/well-perfused, no cyanosis/clubbing/edema MSK: strength 5/5 in all four extremities, no frank joint deformity/effusion  normal ROM to all four extremities with no point  muscle/bony tenderness in spine Neuro/Psych: alert/oriented, sensation grossly intact; normal gait/balance  mood euthymic with congruent affect Diabetic foot exam: see relevant section in EMR. Generally intact. Bilateral bunions to great toe metatarsophalangeal joints. Right great toe nail thickened / folded-over appearance, no evidence for infection.     Assessment & Plan:

## 2013-09-03 NOTE — Assessment & Plan Note (Signed)
A: Compliant with Lipitor, able to afford, for now. No new side effects. Last lipid panel in March of this year.  P: Continue Lipitor. Consider CMP and lipid panel when pt gets insurance after the first of the year. F/u in 3-4 months.

## 2013-09-08 ENCOUNTER — Ambulatory Visit: Payer: Self-pay | Admitting: Cardiology

## 2014-01-05 ENCOUNTER — Other Ambulatory Visit: Payer: Self-pay | Admitting: Family Medicine

## 2014-01-06 ENCOUNTER — Other Ambulatory Visit: Payer: Self-pay | Admitting: Family Medicine

## 2014-01-26 ENCOUNTER — Ambulatory Visit (INDEPENDENT_AMBULATORY_CARE_PROVIDER_SITE_OTHER): Payer: BC Managed Care – PPO | Admitting: Family Medicine

## 2014-01-26 ENCOUNTER — Encounter: Payer: Self-pay | Admitting: Family Medicine

## 2014-01-26 VITALS — BP 123/80 | HR 61 | Temp 98.5°F | Wt 209.0 lb

## 2014-01-26 DIAGNOSIS — E785 Hyperlipidemia, unspecified: Secondary | ICD-10-CM

## 2014-01-26 DIAGNOSIS — I10 Essential (primary) hypertension: Secondary | ICD-10-CM

## 2014-01-26 DIAGNOSIS — E119 Type 2 diabetes mellitus without complications: Secondary | ICD-10-CM

## 2014-01-26 LAB — LIPID PANEL
CHOL/HDL RATIO: 3 ratio
Cholesterol: 132 mg/dL (ref 0–200)
HDL: 44 mg/dL (ref >39)
LDL CALC: 72 mg/dL (ref 0–99)
TRIGLYCERIDES: 81 mg/dL (ref <150)
VLDL: 16 mg/dL (ref 0–40)

## 2014-01-26 LAB — COMPREHENSIVE METABOLIC PANEL
ALK PHOS: 69 U/L (ref 39–117)
ALT: 29 U/L (ref 0–35)
AST: 28 U/L (ref 0–37)
Albumin: 4.2 g/dL (ref 3.5–5.2)
BILIRUBIN TOTAL: 0.3 mg/dL (ref 0.2–1.2)
BUN: 14 mg/dL (ref 6–23)
CO2: 28 mEq/L (ref 19–32)
CREATININE: 0.7 mg/dL (ref 0.50–1.10)
Calcium: 9.5 mg/dL (ref 8.4–10.5)
Chloride: 102 mEq/L (ref 96–112)
Glucose, Bld: 98 mg/dL (ref 70–99)
Potassium: 4 mEq/L (ref 3.5–5.3)
SODIUM: 140 meq/L (ref 135–145)
TOTAL PROTEIN: 7 g/dL (ref 6.0–8.3)

## 2014-01-26 LAB — POCT GLYCOSYLATED HEMOGLOBIN (HGB A1C): Hemoglobin A1C: 6.2

## 2014-01-26 MED ORDER — OLMESARTAN MEDOXOMIL 20 MG PO TABS: 10.0000 mg | ORAL_TABLET | Freq: Every day | ORAL | Status: DC

## 2014-01-26 NOTE — Assessment & Plan Note (Signed)
A: Well-controlled with amlodipine, HCTZ, metoprolol. Would like to restart Benicar, now that she has insurance.  P: Continue currents / doses. Restart Benicar (10 mg) today for kidney protection in diabetes. Will check CMP today, as well. F/u in about 3 months.

## 2014-01-26 NOTE — Assessment & Plan Note (Signed)
A: Compliant with Lipitor, no new side effects. Last lipid panel about a year ago.  P: Continue Lipitor. Check CMP and lipid panel today. F/u in 3 months or as needed.

## 2014-01-26 NOTE — Progress Notes (Signed)
   Subjective:    Patient ID: Terri Estrada, female    DOB: Jun 09, 1951, 63 y.o.   MRN: [id number]  HPI: Pt presents to clinic for f/u of DM, HTN, HLD.   DM - reports compliance with metformin but does not check her CBG's at home (no meter) - denies hypoglycemic episodes; did feel like her blood sugar was low this morning (didn't eat breakfast) - needs eye exam; did not get one last time due to no insurance  HTN - reports compliance with amlodipine, HCTZ, Imdur, and metoprolol - not on ACE due to allergy, and previously was on Benicar but quit due to cost - now has [employer] and would like to restart it  HLD - reports compliance with Lipitor; no new body aches or new difficulties - had previous intolerance to Zocor, but does well with Lipitor  Pt is a former smoker (quit in 2001). Due for colonoscopy. In addition to the above documentation, pt's PMH, surgical history, FH, and SH all reviewed and updated where appropriate in the EMR. I have also reviewed and updated the pt's allergies and current medications as appropriate.  Review of Systems: As above. Does complain of intermittent skin rash (typically onset with stress, not currently bothersome).  Otherwise, full 12-system ROS was reviewed and all negative.     Objective:   Physical Exam BP 123/80  Pulse 61  Temp(Src) 98.5 F (36.9 C) (Oral)  Wt 209 lb (94.802 kg) Gen: well-appearing adult female in NAD HEENT: [place]/AT, sclerae/conjunctivae clear, no lid lag, EOMI, PERRLA   MMM, posterior oropharynx clear, no cervical lymphadenopathy  neck supple with full ROM, no masses appreciated; thyroid not enlarged  Cardio: RRR, no murmur appreciated; distal pulses intact/symmetric Pulm: CTAB, no wheezes, normal WOB  Abd: soft, nondistended, BS+, no HSM Ext: warm/well-perfused, no cyanosis/clubbing/edema MSK: strength 5/5 in all four extremities, no frank joint deformity/effusion  normal ROM to all four extremities with no point  muscle/bony tenderness in spine Neuro/Psych: alert/oriented, sensation grossly intact; normal gait/balance  mood euthymic with congruent affect     Assessment & Plan:

## 2014-01-26 NOTE — Assessment & Plan Note (Signed)
A: Reports compliance with metformin and doing well with diet / exercise. Does not check CBG's at home, not on insulin. Denies hypoglycemic symptoms.  P: Continue metformin. Eye scan, today. F/u in 3 months for recheck A1c.

## 2014-01-26 NOTE — Patient Instructions (Signed)
Thank you for coming in, today!  We will restart your Benicar (generic is olmesartan). Take one HALF of one tablet, every day.  We'll check your liver, kidney, and cholesterol levels today. I'll call you or send you a letter with those results.  Otherwise, don't make any changes. We'll scan your eyes today, and I'll let you know what that shows, as well. I'll give you the information on how to schedule a colonoscopy.  Come back to see me in about 3 months, or sooner if you need. Please feel free to call with any questions or concerns at any time, at [phone]. --Dr. Casper HarrisonStreet

## 2014-01-29 ENCOUNTER — Encounter: Payer: Self-pay | Admitting: Family Medicine

## 2014-04-01 ENCOUNTER — Other Ambulatory Visit: Payer: Self-pay | Admitting: Family Medicine

## 2014-04-01 DIAGNOSIS — Z1231 Encounter for screening mammogram for malignant neoplasm of breast: Secondary | ICD-10-CM

## 2014-04-27 ENCOUNTER — Encounter: Payer: Self-pay | Admitting: Family Medicine

## 2014-04-27 ENCOUNTER — Ambulatory Visit (HOSPITAL_COMMUNITY)
Admission: RE | Admit: 2014-04-27 | Discharge: 2014-04-27 | Disposition: A | Discharge status: Home / Self Care | Payer: BC Managed Care – PPO | Source: Ambulatory Visit | Attending: Family Medicine | Admitting: Family Medicine

## 2014-04-27 ENCOUNTER — Ambulatory Visit (INDEPENDENT_AMBULATORY_CARE_PROVIDER_SITE_OTHER): Payer: BC Managed Care – PPO | Admitting: Family Medicine

## 2014-04-27 VITALS — BP 119/70 | HR 57 | Temp 98.1°F | Wt 207.0 lb

## 2014-04-27 DIAGNOSIS — E785 Hyperlipidemia, unspecified: Secondary | ICD-10-CM

## 2014-04-27 DIAGNOSIS — Z1231 Encounter for screening mammogram for malignant neoplasm of breast: Secondary | ICD-10-CM | POA: Insufficient documentation

## 2014-04-27 DIAGNOSIS — I10 Essential (primary) hypertension: Secondary | ICD-10-CM

## 2014-04-27 DIAGNOSIS — E119 Type 2 diabetes mellitus without complications: Secondary | ICD-10-CM

## 2014-04-27 DIAGNOSIS — N8189 Other female genital prolapse: Secondary | ICD-10-CM

## 2014-04-27 LAB — POCT GLYCOSYLATED HEMOGLOBIN (HGB A1C): Hemoglobin A1C: 6.4

## 2014-04-27 MED ORDER — OLMESARTAN MEDOXOMIL 20 MG PO TABS: 10.0000 mg | ORAL_TABLET | Freq: Every day | ORAL | Status: DC

## 2014-04-27 MED ORDER — METFORMIN HCL 1000 MG PO TABS: 500.0000 mg | ORAL_TABLET | Freq: Two times a day (BID) | ORAL | Status: DC

## 2014-04-27 NOTE — Progress Notes (Signed)
   Subjective:    Patient ID: Terri Estrada, female    DOB: 11-Feb-1951, 63 y.o.   MRN: [id number]  HPI: Pt presents to clinic for F/U of diabetes, HTN, and HLD. Of note, she also complains of a bulging sensation in her vaginal area and states she can "see something round" in her vagina at times, intermittently, since December. A couple of years ago, she states she was lifting something and states she felt like something "dropped." She denies vaginal pain / bleeding. She states she has no urinary complaints. She has not seen a GYN doctor for this; she states she had 4 vaginal deliveries in the past.  DM - reports compliance with metformin but only 500 mg BID in place of 1000 mg due to "metallic" taste in her mouth with 1000 mg - does not check CBG's at home and denies numbness / tingling and denies frank hypoglycemic symptoms - does do some treadmill walking for exercise  HTN - reports compliance with Norvasc, HCTZ, and metoprolol - given Rx for Benicar to restart, last time, but has been unable to get it due to cost; wants to continue looking at different pharmacies - denies chest pain, SOB, LE swelling  HLD - reports compliance with Lipitor and denies new muscle or body aches; denies GI upset  Pt is a former smoker. She recently had a normal mammogram. In addition to the above documentation, pt's PMH, surgical history, FH, and SH all reviewed and updated where appropriate in the EMR. I have also reviewed and updated the pt's allergies and current medications as appropriate.  Review of Systems: As above. Otherwise, full 12-system ROS was reviewed and all negative.      Objective:   Physical Exam BP 119/70  Pulse 57  Temp(Src) 98.1 F (36.7 C) (Oral)  Wt 207 lb (93.895 kg) Gen: well-appearing adult female in NAD HEENT: [place]/AT, sclerae/conjunctivae clear, no lid lag, EOMI, PERRLA   MMM, posterior oropharynx clear, no cervical lymphadenopathy  neck supple with full ROM, no masses  appreciated; thyroid not enlarged  Cardio: RRR, no murmur appreciated; distal pulses intact/symmetric Pulm: CTAB, no wheezes, normal WOB  Abd: soft, nondistended, BS+, no HSM Ext: warm/well-perfused, no cyanosis/clubbing/edema MSK: strength 5/5 in all four extremities, no frank joint deformity/effusion  normal ROM to all four extremities with no point muscle/bony tenderness in spine Neuro/Psych: alert/oriented, sensation grossly intact; normal gait/balance  mood euthymic with congruent affect GU: pt declines exam     Assessment & Plan:  See problem list notes.  The above note reflects an HPI obtained with the assistance of A. Goins, MS4, with additions / clarifications based on my independent interview. The above and all problem list notes reflect my independent exam and assessments/plans.  Bobbye Mortonhristopher M Laketa Sandoz, MD PGY-3, [facility] 04/27/2014, 2:17 PM

## 2014-04-27 NOTE — Assessment & Plan Note (Signed)
A: A1c at goal, 6.4. Compliant with metformin but at reduced dose from Rx (only 500 mg BID instead of 1000 mg BID due to metallic taste in mouth). Does not check CBG's at home, not on insulin, and no hypoglycemic symptoms. Recent eye exam without retinopathy.  P: Continue metformin at 500 mg BID. F/u in 3 months for recheck A1c.

## 2014-04-27 NOTE — Assessment & Plan Note (Signed)
A: Well-controlled with amlodipine, HCTZ, metoprolol; unable to restart Benicar due to cost, but pt would like to "shop around" to see if she can get it affordably.  P: Continue current medications / doses. Rx printed for Benicar to see if she can find a pharmacy she can afford. F/u in about 3 months; monitor BP as needed.

## 2014-04-27 NOTE — Assessment & Plan Note (Signed)
A: Compliant with Lipitor, no new side effects.  P: Continue Lipitor. Plan recheck CMP and lipid panel in 6 months or so. F/u as needed, otherwise.

## 2014-04-27 NOTE — Assessment & Plan Note (Signed)
A: Symptoms concerning for muscle floor relaxation without frank prolapse, infection, or other serious pathology; pt declines exam today but would like to see GYN provider for this.  P: [facility]; if referral is needed, will place. Instructed to f/u here or there per preference, sooner rather than later, if she has any frank organ prolapse or vaginal or urinary symptoms. Otherwise f/u as needed.

## 2014-04-27 NOTE — Patient Instructions (Signed)
Thank you for coming in, today!  Everything looks and sounds good today. Your A1c was 6.4. You can continue just taking metformin 500 mg (one HALF of one tablet) twice a day. Keep taking your other medicines without any changes.  Try to find a pharmacy that can sell you the Benicar that you can afford. Your blood pressure is fine, but this medicine will help protect your kidneys with your diabetes.  Come back to see me in about 3 months. Call [facility] to tell them that you need to be seen for your vaginal complaint. They will be able to help me figure out what, if anything, needs to be done. Their number is [phone] -- ask for the outpatient clinic.  Please feel free to call with any questions or concerns at any time, at [phone]. --Dr. Casper HarrisonStreet

## 2014-05-12 ENCOUNTER — Telehealth: Payer: Self-pay | Admitting: Family Medicine

## 2014-05-12 NOTE — Telephone Encounter (Signed)
Pt called and would like to speak to Dr. Casper HarrisonStreet about the medication Benicar. She said that her insurance will not cover this and would like another medication. jw

## 2014-05-13 MED ORDER — TRAMADOL HCL 50 MG PO TABS: 50.0000 mg | ORAL_TABLET | Freq: Three times a day (TID) | ORAL | Status: AC | PRN

## 2014-05-13 NOTE — Telephone Encounter (Signed)
Prior auth completed and given to Darcella Cheshire. Martin, RN. Mr. Terri Estrada (who is also my patient) came to pick up [name]'s tramadol Rx in person. Instructed him that she can follow up with me as needed. --CMS

## 2014-05-13 NOTE — Telephone Encounter (Signed)
[facility] for Benicar 20 mg.  PA form placed in provider box for completion. Clovis PuMartin, Lucylle Foulkes L, RN

## 2014-05-13 NOTE — Telephone Encounter (Signed)
Called pt to discuss. Explained what a prior authorization is. Will complete prior auth and if declined, will proceed without ARB (pt's blood pressure is well-controlled without it; Rx was for kidney protection and pt is intolerant of ACE's). Pt voiced understanding and also requests refill of tramadol for knee pain; she uses this very infrequently (her last prescription lasted almost a year). Will print paper Rx and leave at front desk for pt to pick up this afternoon. Pt to f/u as needed, otherwise. --CMS

## 2014-05-14 NOTE — Telephone Encounter (Signed)
[facility]/2014 [facility] [place] [facility]  Clovis PuMartin, December Hedtke L, RN

## 2014-05-15 NOTE — Telephone Encounter (Signed)
[place] for Benicar [place].  [facility] pharmacy aware.  Denial form placed in provider box for review.  Clovis PuMartin, Natelie Ostrosky L, RN

## 2014-05-25 ENCOUNTER — Other Ambulatory Visit: Payer: Self-pay | Admitting: Family Medicine

## 2014-06-21 ENCOUNTER — Other Ambulatory Visit: Payer: Self-pay | Admitting: Family Medicine

## 2014-06-26 ENCOUNTER — Ambulatory Visit (INDEPENDENT_AMBULATORY_CARE_PROVIDER_SITE_OTHER): Payer: BC Managed Care – PPO | Admitting: Obstetrics & Gynecology

## 2014-06-26 ENCOUNTER — Encounter: Payer: Self-pay | Admitting: Obstetrics & Gynecology

## 2014-06-26 VITALS — BP 133/62 | HR 50 | Ht 62.0 in | Wt 208.0 lb

## 2014-06-26 DIAGNOSIS — N8110 Cystocele, unspecified: Secondary | ICD-10-CM | POA: Insufficient documentation

## 2014-06-26 DIAGNOSIS — N8111 Cystocele, midline: Secondary | ICD-10-CM | POA: Diagnosis not present

## 2014-06-26 NOTE — Progress Notes (Signed)
Patient ID: Terri Estrada, female   DOB: March 19, 1951, 63 y.o.   MRN: [id number]  Chief Complaint  Patient presents with  . Vaginal Prolapse    HPI Terri Estrada is a 63 y.o. female.  [id number] No LMP recorded. Patient is postmenopausal. For last year has had vaginal bulge most noticeable when standing. No pain, bleeding or D/C HPI  Past Medical History  Diagnosis Date  . Type II or unspecified type diabetes mellitus without mention of complication, not stated as uncontrolled   . Other and unspecified hyperlipidemia   . Essential hypertension, benign   . Obesity, unspecified   . Coronary atherosclerosis of unspecified type of vessel, native or graft   . Sickle-cell trait   . Arthritis   . Diverticulosis of colon (without mention of hemorrhage)   . Aortic stenosis   . MI (myocardial infarction)   . Osteoarthrosis, unspecified whether generalized or localized, unspecified site   . Onychia and paronychia of toe   . Streptococcal sore throat     Past Surgical History  Procedure Laterality Date  . Carotid stent  09/21/00  . Angioplasty  09/21/00    Family History  Problem Relation Age of Onset  . Sickle cell anemia Daughter   . Hypertension Mother   . Diabetes type II Mother   . Diabetes type II Sister     Social History History  Substance Use Topics  . Smoking status: Former Games developer  . Smokeless tobacco: Never Used     Comment: quit nov 2001  . Alcohol Use: No    Allergies  Allergen Reactions  . Ace Inhibitors     REACTION: cough  . Zocor [Simvastatin] Other (See Comments)    Muscle aches (severe) in legs; has taken Lipitor without any issues    Current Outpatient Prescriptions  Medication Sig Dispense Refill  . amLODipine (NORVASC) 10 MG tablet TAKE 1 TABLET BY MOUTH DAILY  90 tablet  2  . aspirin 325 MG tablet Take 325 mg by mouth daily.        . Calcium Carbonate (CALCIUM 600) 1500 MG TABS Take 600 mg by mouth 2 (two) times daily.       .  hydrochlorothiazide (HYDRODIURIL) 25 MG tablet TAKE 1 TABLET EVERY DAY  90 tablet  3  . isosorbide mononitrate (IMDUR) 30 MG 24 hr tablet Take 1 tablet (30 mg total) by mouth daily.  90 tablet  1  . metFORMIN (GLUCOPHAGE) 1000 MG tablet Take 0.5 tablets (500 mg total) by mouth 2 (two) times daily with a meal.  180 tablet  3  . metoprolol tartrate (LOPRESSOR) 25 MG tablet Take 1 tablet (25 mg total) by mouth 2 (two) times daily.  180 tablet  1  . Multiple Vitamins-Minerals (MULTIVITAMINS THER. W/MINERALS) TABS Take 1 tablet by mouth daily.        . Omega-3 Fatty Acids (FISH OIL) 1200 MG CAPS Take 1 capsule by mouth 2 (two) times daily.        Marland Kitchen atorvastatin (LIPITOR) 80 MG tablet Take 1 tablet (80 mg total) by mouth daily.  90 tablet  3  . olmesartan (BENICAR) 20 MG tablet Take 0.5 tablets (10 mg total) by mouth daily.  45 tablet  1   No current facility-administered medications for this visit.    Review of Systems Review of Systems  Constitutional: Negative for unexpected weight change (voluntary weight loss).  Genitourinary: Negative for dysuria, urgency, vaginal discharge, pelvic pain and dyspareunia.  Blood pressure 133/62, pulse 50, height  (1.575 m), weight 208 lb (94.348 kg).  Physical Exam Physical Exam  Constitutional: She is oriented to person, place, and time. She appears well-developed. No distress.  Pulmonary/Chest: Effort normal. No respiratory distress.  Genitourinary: No vaginal discharge found.  Gr 2 cystocele with cough or standing  Neurological: She is alert and oriented to person, place, and time.  Skin: Skin is warm and dry.  Psychiatric: She has a normal mood and affect. Her behavior is normal.    Data Reviewed Office note and pap result  Assessment    cystocele     Plan    Weight loss, consider pessary when she returns in 3-4 months        ARNOLD,JAMES 06/26/2014, 10:24 AM

## 2014-06-26 NOTE — Progress Notes (Signed)
Patient states that she feels a bulge in her vaginal area. Like something has dropped.

## 2014-06-26 NOTE — Patient Instructions (Signed)

## 2014-07-07 ENCOUNTER — Other Ambulatory Visit: Payer: Self-pay | Admitting: Family Medicine

## 2014-07-08 ENCOUNTER — Telehealth: Payer: Self-pay | Admitting: Family Medicine

## 2014-07-08 DIAGNOSIS — E785 Hyperlipidemia, unspecified: Secondary | ICD-10-CM

## 2014-07-08 NOTE — Telephone Encounter (Signed)
Refill request for Lipitor. [facility] [address]

## 2014-07-09 ENCOUNTER — Other Ambulatory Visit: Payer: Self-pay | Admitting: Family Medicine

## 2014-07-09 MED ORDER — ATORVASTATIN CALCIUM 80 MG PO TABS: 80.0000 mg | ORAL_TABLET | Freq: Every day | ORAL | Status: DC

## 2014-07-09 NOTE — Telephone Encounter (Signed)
Lipitor sent in to [facility]. Thanks. --CMS

## 2014-08-10 ENCOUNTER — Encounter: Payer: Self-pay | Admitting: Obstetrics & Gynecology

## 2014-08-19 ENCOUNTER — Encounter: Payer: Self-pay | Admitting: Family Medicine

## 2014-08-19 ENCOUNTER — Ambulatory Visit (INDEPENDENT_AMBULATORY_CARE_PROVIDER_SITE_OTHER): Payer: BC Managed Care – PPO | Admitting: Family Medicine

## 2014-08-19 ENCOUNTER — Ambulatory Visit (INDEPENDENT_AMBULATORY_CARE_PROVIDER_SITE_OTHER): Payer: BC Managed Care – PPO | Admitting: *Deleted

## 2014-08-19 VITALS — BP 120/77 | HR 62 | Ht 62.0 in | Wt 207.0 lb

## 2014-08-19 DIAGNOSIS — I10 Essential (primary) hypertension: Secondary | ICD-10-CM | POA: Diagnosis not present

## 2014-08-19 DIAGNOSIS — E119 Type 2 diabetes mellitus without complications: Secondary | ICD-10-CM | POA: Diagnosis not present

## 2014-08-19 DIAGNOSIS — Z23 Encounter for immunization: Secondary | ICD-10-CM

## 2014-08-19 DIAGNOSIS — E785 Hyperlipidemia, unspecified: Secondary | ICD-10-CM | POA: Diagnosis not present

## 2014-08-19 LAB — POCT GLYCOSYLATED HEMOGLOBIN (HGB A1C): Hemoglobin A1C: 6.3

## 2014-08-19 MED ORDER — ATORVASTATIN CALCIUM 80 MG PO TABS: 80.0000 mg | ORAL_TABLET | Freq: Every day | ORAL | Status: DC

## 2014-08-19 MED ORDER — ZOSTER VACCINE LIVE 19400 UNT/0.65ML ~~LOC~~ SOLR: 0.6500 mL | Freq: Once | SUBCUTANEOUS | Status: DC

## 2014-08-19 NOTE — Assessment & Plan Note (Signed)
A: Well-controlled with Norvasc, HCTZ, metoprolol. Unable to restart Benicar due to cost. Denies headache, change in vision, LE swelling.  P: Continue current meds (Norvasc 10, HCTZ 25, metoprolol 25 mg BID). Monitor BP as needed, f/u in 3 months.

## 2014-08-19 NOTE — Progress Notes (Signed)
   Subjective:    Patient ID: Terri Estrada, female    DOB: Feb 17, 1951, 63 y.o.   MRN: [id number]  HPI: Pt presents to clinic for F/U of diabetes, HTN, and HLD. Of note, she is being seen by Dr. Debroah LoopArnold for vaginal cystocele, but states it is not giving her any problems and she will see Dr. Debroah LoopArnold again in January.  DM - reports compliance with metformin, taking 500 mg BID (higher doses give her a very bad taste in her mouth and GI side effects) - denies side effects / issues with low-dose metformin and is pleased to hear her A1c is still at goal (6.3 today) - does not check CBG's at home and denies numbness / tingling and denies frank hypoglycemic symptoms - continues to use her treadmill for walking exercise  HTN - reports compliance with Norvasc, HCTZ, and metoprolol - unable to get Benicar due to cost, still - denies chest pain, SOB, LE swelling  HLD - has not been taking Lipitor due to difficulties getting prescription to pharmacy properly (appears to have been ordered incorrectly by me last month) - requests paper Rx for refill of Lipitor and denies new muscle or body aches; has tolerated Lipitor well in the past but did not tolerate Zocor  Pt is a former smoker. She needs a colonoscopy but has not yet called anyone to schedule this In addition to the above documentation, pt's PMH, surgical history, FH, and SH all reviewed and updated where appropriate in the EMR. I have also reviewed and updated the pt's allergies and current medications as appropriate.  Review of Systems: As above. Her left knee bothers her (known OA) but is managed with Tylenol and she does not want to pursue orthopedics consult, yet.  Otherwise, full 12-system ROS was reviewed and all negative.      Objective:   Physical Exam BP 120/77 mmHg  Pulse 62  Ht 5\' 2"  (1.575 m)  Wt 207 lb (93.895 kg)  BMI 37.85 kg/m2 Gen: well-appearing adult female in NAD HEENT: [place]/AT, sclerae/conjunctivae clear, no lid lag,  EOMI, PERRLA   MMM, posterior oropharynx clear, no cervical lymphadenopathy  neck supple with full ROM, no masses appreciated; thyroid not enlarged  Cardio: RRR, no murmur appreciated; distal pulses intact/symmetric Pulm: CTAB, no wheezes, normal WOB  Abd: soft, nondistended, BS+, no HSM Ext: warm/well-perfused, no cyanosis/clubbing/edema MSK: strength 5/5 in all four extremities, no frank joint deformity/effusion  Bilateral crepitus with compression over patellae and resisted ROM  Minimal tenderness with palpation of knees along joint lines  normal ROM to all four extremities with no point muscle/bony tenderness in spine Neuro/Psych: alert/oriented, sensation grossly intact; normal gait/balance  mood euthymic with congruent affect Diabetic foot exam: see EPIC flowsheet; generally unremarkable  Bilateral bunions to great toe metatarsophalangeal joints.  Right great toe nail thickened / folded-over appearance, no evidence for infection.     Assessment & Plan:  See problem list notes. For knee pain, plan to monitor for now and follow up specifically for this if pt decides she would like further work-up / treatment.  Bobbye Mortonhristopher M Findley Blankenbaker, MD PGY-3, [facility] 08/19/2014, 2:37 PM

## 2014-08-19 NOTE — Assessment & Plan Note (Signed)
A: A1c at goal (6.3) with metformin at 500 mg BID due to side effects / intolerance (metallic taste in mouth) with higher doses. Does not check CBG's at home, not on insulin. Denies hypo- or hyperglycmic symptoms. Sees ophthalmology regularly.  P: Continue metformin 500 mg BID. F/u in 3 months for next A1c check.

## 2014-08-19 NOTE — Assessment & Plan Note (Signed)
A: Compliant with Lipitor but off of it for about 1 month due to difficulty getting prescription. Tolerated well, otherwise.  P: Printed Rx given today for Lipitor 80 mg daily. F/u in 3 months; likely repeat CMP and lipid panel at that time, or 1 year from last check.

## 2014-08-19 NOTE — Patient Instructions (Signed)
Thank you for coming in, today!  Everything looks and sounds good today. Your A1c was 6.3. You can continue just taking metformin HALF of one tablet twice a day. Keep taking your other medicines without any changes. I will give you a printed prescription for Lipitor (atorvastatin).  I will give you a prescription for the shingles vaccine (Zoster is the name of the virus). They should be able to give you that shot at the pharmacy. The shot will NOT cause shingles but if you it again, it won't be as bad.   Come back to see me in about 3 months. If you need to be seen sooner, call to make a sooner appointment.  Make sure you see Dr. Debroah LoopArnold again in January. Call about getting a colonoscopy!  Please feel free to call with any questions or concerns at any time, at [phone]. --Dr. Casper HarrisonStreet

## 2014-11-24 ENCOUNTER — Other Ambulatory Visit: Payer: Self-pay | Admitting: *Deleted

## 2014-11-24 MED ORDER — AMLODIPINE BESYLATE 10 MG PO TABS: 10.0000 mg | ORAL_TABLET | Freq: Every day | ORAL | Status: DC

## 2015-01-07 ENCOUNTER — Ambulatory Visit (INDEPENDENT_AMBULATORY_CARE_PROVIDER_SITE_OTHER): Payer: 59 | Admitting: Family Medicine

## 2015-01-07 ENCOUNTER — Encounter: Payer: Self-pay | Admitting: Family Medicine

## 2015-01-07 ENCOUNTER — Ambulatory Visit (INDEPENDENT_AMBULATORY_CARE_PROVIDER_SITE_OTHER): Payer: 59 | Admitting: Obstetrics & Gynecology

## 2015-01-07 ENCOUNTER — Encounter: Payer: Self-pay | Admitting: Obstetrics & Gynecology

## 2015-01-07 VITALS — BP 106/59 | HR 64 | Temp 98.0°F | Ht 62.0 in | Wt 208.6 lb

## 2015-01-07 VITALS — BP 129/79 | HR 54 | Temp 98.0°F | Ht 62.0 in | Wt 208.8 lb

## 2015-01-07 DIAGNOSIS — M2142 Flat foot [pes planus] (acquired), left foot: Secondary | ICD-10-CM

## 2015-01-07 DIAGNOSIS — E119 Type 2 diabetes mellitus without complications: Secondary | ICD-10-CM | POA: Diagnosis not present

## 2015-01-07 DIAGNOSIS — I10 Essential (primary) hypertension: Secondary | ICD-10-CM | POA: Diagnosis not present

## 2015-01-07 DIAGNOSIS — F329 Major depressive disorder, single episode, unspecified: Secondary | ICD-10-CM

## 2015-01-07 DIAGNOSIS — M21619 Bunion of unspecified foot: Secondary | ICD-10-CM | POA: Insufficient documentation

## 2015-01-07 DIAGNOSIS — E785 Hyperlipidemia, unspecified: Secondary | ICD-10-CM | POA: Diagnosis not present

## 2015-01-07 DIAGNOSIS — N8110 Cystocele, unspecified: Secondary | ICD-10-CM

## 2015-01-07 DIAGNOSIS — M2010 Hallux valgus (acquired), unspecified foot: Secondary | ICD-10-CM | POA: Diagnosis not present

## 2015-01-07 DIAGNOSIS — F32A Depression, unspecified: Secondary | ICD-10-CM | POA: Insufficient documentation

## 2015-01-07 DIAGNOSIS — M2141 Flat foot [pes planus] (acquired), right foot: Secondary | ICD-10-CM | POA: Insufficient documentation

## 2015-01-07 LAB — POCT GLYCOSYLATED HEMOGLOBIN (HGB A1C): Hemoglobin A1C: 6.3

## 2015-01-07 MED ORDER — ISOSORBIDE MONONITRATE ER 30 MG PO TB24: ORAL_TABLET | ORAL | Status: DC

## 2015-01-07 MED ORDER — METOPROLOL TARTRATE 25 MG PO TABS: ORAL_TABLET | ORAL | Status: DC

## 2015-01-07 MED ORDER — METFORMIN HCL 1000 MG PO TABS: 500.0000 mg | ORAL_TABLET | Freq: Two times a day (BID) | ORAL | Status: DC

## 2015-01-07 NOTE — Assessment & Plan Note (Signed)
A: Compliant with Lipitor. Tolerating well.  P: Continue Lipitor and f/u in 3 months. Needs repeat CMP and lipid panel at next f/u (not yet 1 year from last lipid panel).

## 2015-01-07 NOTE — Assessment & Plan Note (Signed)
Long-standing bunions of bilateral great toes without frank problems. Referred to podiatry today given pes planus, possible neuropathic-type pain, and bunions; counseled that surgery may be an option but may not be necessary. Pt to f/u as needed.

## 2015-01-07 NOTE — Assessment & Plan Note (Signed)
A: A1c remains at goal (6.3) with metformin at 500 mg; taking metformin at lower dose due to side effects / intolerance (metallic taste in mouth) with higher doses. Denies hypo- or hyperglycmic symptoms. Some possible neuropathic-type symptoms in her right foot; see separate problem list notes.  P: Continue metformin 500 mg BID. F/u in 3 months for next A1c check. Referred to podiatry today.

## 2015-01-07 NOTE — Assessment & Plan Note (Signed)
A: More marked fallen arch of right foot, compared to left. Possible contribution to "burning" pain on the right lateral foot, but also in context of DM.  P: Referred to podiatry today for consideration of diabetic shoes and / or custom orthotics. F/u otherwise as needed.

## 2015-01-07 NOTE — Progress Notes (Signed)
Subjective:     Patient ID: Terri Estrada, female   DOB: 1950/12/01, 64 y.o.   MRN: [id number]  [id number] Cystocele symptoms are inconsistent, not worse. Plans to lose weight.   Review of Systems  Constitutional: Negative.   Genitourinary: Negative for dysuria, vaginal bleeding, vaginal discharge and difficulty urinating.       Objective:   Physical Exam  Constitutional: She is oriented to person, place, and time.  Genitourinary: Vagina normal. No vaginal discharge found.  No change in cystocele  Neurological: She is alert and oriented to person, place, and time.  Skin: Skin is warm and dry.  Psychiatric: She has a normal mood and affect. Her behavior is normal.       Assessment:     Cystocele stable     Plan:     Kegel exercises and weight loss explained, RTC prn  Adam PhenixJames G Liz Pinho, MD 01/07/2015

## 2015-01-07 NOTE — Patient Instructions (Signed)
Thank you for coming in, today!  Thank you for coming to talk to me about your feelings. I know that's not easy to do. I do think it's worth talking to someone about your feelings. There are two potentially good options. One is the [facility]. Their number is [phone]. Try them first. They can talk to you about therapy and potentially medications if they think that would be helpful. The other option is [facility] [place]. Their number is [phone].  Otherwise, keep taking your medications without any changes. I will refer you to the podiatrist today for your feet. They can talk to you about diabetic shoes, custom inserts, etc. They can also talk to you about your bunions.  Come back to see me in June, toward the end of June. We'll recheck your labs then. If you need me any time before then, give me a call. Please feel free to call with any questions or concerns at any time, at [phone]. --Dr. Casper HarrisonStreet
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
## 2015-01-07 NOTE — Assessment & Plan Note (Signed)
A: Well-controlled with Norvasc, HCTZ, metoprolol. Denies headache, change in vision, LE swelling. Needs refills.  P: Continue current meds (Norvasc 10, HCTZ 25, metoprolol 25 mg BID; also on Imdur). Refilled metoprolol and Imdur. Monitor BP as needed, f/u in 3 months

## 2015-01-07 NOTE — Patient Instructions (Signed)
Kegel Exercises The goal of Kegel exercises is to isolate and exercise your pelvic floor muscles. These muscles act as a hammock that supports the rectum, vagina, small intestine, and uterus. As the muscles weaken, the hammock sags and these organs are displaced from their normal positions. Kegel exercises can strengthen your pelvic floor muscles and help you to improve bladder and bowel control, improve sexual response, and help reduce many problems and some discomfort during pregnancy. Kegel exercises can be done anywhere and at any time. HOW TO PERFORM KEGEL EXERCISES 1. Locate your pelvic floor muscles. To do this, squeeze (contract) the muscles that you use when you try to stop the flow of urine. You will feel a tightness in the vaginal area (women) and a tight lift in the rectal area (men and women). 2. When you begin, contract your pelvic muscles tight for 2-5 seconds, then relax them for 2-5 seconds. This is one set. Do 4-5 sets with a short pause in between. 3. Contract your pelvic muscles for 8-10 seconds, then relax them for 8-10 seconds. Do 4-5 sets. If you cannot contract your pelvic muscles for 8-10 seconds, try 5-7 seconds and work your way up to 8-10 seconds. Your goal is 4-5 sets of 10 contractions each day. Keep your stomach, buttocks, and legs relaxed during the exercises. Perform sets of both short and long contractions. Vary your positions. Perform these contractions 3-4 times per day. Perform sets while you are:   Lying in bed in the morning.  Standing at lunch.  Sitting in the late afternoon.  Lying in bed at night. You should do 40-50 contractions per day. Do not perform more Kegel exercises per day than recommended. Overexercising can cause muscle fatigue. Continue these exercises for for at least 15-20 weeks or as directed by your caregiver. Document Released: 09/11/2012 Document Reviewed: 09/11/2012 Exit[employer] Patient Information 2015 [employer]. This information is  not intended to replace advice given to you by your health care provider. Make sure you discuss any questions you have with your health care provider.  

## 2015-01-07 NOTE — Assessment & Plan Note (Signed)
A: Uncertain exact onset, but present at least for 10+ years, off and on, though pt reports it "does not affect her day-to-day" and she has repeatedly "put off" talking to anyone about it. She is interested in therapy but not medications at this point. PHQ-9 reviewed with answers as above in HPI / ROS. NO SI / HI, AH / VH. Pt is generally healthy otherwise with well-controlled HTN and DM.  P: Discussed various forms of management at length; praised pt for speaking to me about very difficult issues. Strongly recommended f/u with psychologist to start with, and to consider formal psychiatric evaluation in the future, if she ever feels medication might be helpful. [facility]commended [facility] and / or [facility] and provided contact information. Advised pt to f/u as needed and to call if she ever has questions / concerns; could consider IC model visits with Dr. Pascal LuxKane in addition to any other modalities of care, depending on if / when and where pt follows up, otherwise.

## 2015-01-25 ENCOUNTER — Other Ambulatory Visit: Payer: Self-pay | Admitting: Family Medicine

## 2015-02-14 ENCOUNTER — Other Ambulatory Visit: Payer: Self-pay | Admitting: Family Medicine

## 2015-03-22 ENCOUNTER — Other Ambulatory Visit: Payer: Self-pay | Admitting: Family Medicine

## 2015-03-26 ENCOUNTER — Encounter: Payer: Self-pay | Admitting: Family Medicine

## 2015-03-26 ENCOUNTER — Ambulatory Visit (INDEPENDENT_AMBULATORY_CARE_PROVIDER_SITE_OTHER): Payer: 59 | Admitting: Family Medicine

## 2015-03-26 VITALS — BP 124/80 | HR 57 | Temp 98.3°F | Ht 62.0 in | Wt 201.0 lb

## 2015-03-26 DIAGNOSIS — F32A Depression, unspecified: Secondary | ICD-10-CM

## 2015-03-26 DIAGNOSIS — I10 Essential (primary) hypertension: Secondary | ICD-10-CM

## 2015-03-26 DIAGNOSIS — M2011 Hallux valgus (acquired), right foot: Secondary | ICD-10-CM | POA: Diagnosis not present

## 2015-03-26 DIAGNOSIS — F329 Major depressive disorder, single episode, unspecified: Secondary | ICD-10-CM

## 2015-03-26 DIAGNOSIS — E785 Hyperlipidemia, unspecified: Secondary | ICD-10-CM

## 2015-03-26 DIAGNOSIS — E119 Type 2 diabetes mellitus without complications: Secondary | ICD-10-CM | POA: Diagnosis not present

## 2015-03-26 DIAGNOSIS — Z113 Encounter for screening for infections with a predominantly sexual mode of transmission: Secondary | ICD-10-CM

## 2015-03-26 LAB — COMPREHENSIVE METABOLIC PANEL
ALK PHOS: 75 U/L (ref 39–117)
ALT: 24 U/L (ref 0–35)
AST: 28 U/L (ref 0–37)
Albumin: 4.3 g/dL (ref 3.5–5.2)
BILIRUBIN TOTAL: 0.5 mg/dL (ref 0.2–1.2)
BUN: 14 mg/dL (ref 6–23)
CO2: 29 meq/L (ref 19–32)
Calcium: 10 mg/dL (ref 8.4–10.5)
Chloride: 101 mEq/L (ref 96–112)
Creat: 0.74 mg/dL (ref 0.50–1.10)
GLUCOSE: 92 mg/dL (ref 70–99)
Potassium: 4.3 mEq/L (ref 3.5–5.3)
SODIUM: 143 meq/L (ref 135–145)
TOTAL PROTEIN: 7.3 g/dL (ref 6.0–8.3)

## 2015-03-26 LAB — POCT GLYCOSYLATED HEMOGLOBIN (HGB A1C): HEMOGLOBIN A1C: 5.9

## 2015-03-26 LAB — LIPID PANEL
Cholesterol: 117 mg/dL (ref 0–200)
HDL: 48 mg/dL (ref >=46)
LDL CALC: 55 mg/dL (ref 0–99)
Total CHOL/HDL Ratio: 2.4 Ratio
Triglycerides: 71 mg/dL (ref <150)
VLDL: 14 mg/dL (ref 0–40)

## 2015-03-26 NOTE — Assessment & Plan Note (Signed)
A: Overall symptoms still present but stable. No active SI / HI or AH / VH. Pt states she feels better than before and "just doesn't think about things, and they don't bother [her]." Pending appt with therapist as noted in HPI.  P: F/u with therapist as scheduled. Continued to counsel pt to f/u as needed, otherwise, and encouraged her to thank therapist for me and to have any notes routed to [place]. Otherwise f/u as needed -- may be a good candidate for integrated care model visit with Dr. Pascal Lux in the future, unless f/u with therapist is easier to coordinate.

## 2015-03-26 NOTE — Patient Instructions (Signed)
Thank you for coming in, today!  Everything looks and sounds pretty good, today. Keep your appointment with your therapist and ask her to send me any notes she writes. Please also give her my thanks for seeing you!  I will check some bloodwork today and send you a letter with the result. Your A1c is 5.9 -- keep up the great work! We can consider taking you off metformin and seeing how you do, in the future. Otherwise, keep taking your medications without any changes. Call your pharmacy if you need refills.  Follow up with your podiatrist for your feet and get in to see your eye doctor. Get a colonoscopy sometime soon, as well!  Come back to see your new doctor after me in about 3 months, or sooner if you need. If you need me any time before then, give [place] a call. Please feel free to call with any questions or concerns at any time, at [phone]. --Dr. Casper Harrison

## 2015-03-26 NOTE — Assessment & Plan Note (Signed)
Pt with pending podiatry appointment. Some improvement with arch-support inserts OTC. No frank feet lesions, today. F/u with podiatrist as scheduled, otherwise.

## 2015-03-26 NOTE — Assessment & Plan Note (Signed)
A: A1c much improved even beyond goal at 5.9 with some recent weight loss and improved exercise. Compliant with metformin BID (previously on higher dose, reduced to 500 mg BID due to side effect of metallic taste in mouth). No hypo- or hyperglycemic symptoms.  P: Continue metformin 500 mg BID, for now; discussed possibly coming off of this entirely in the future. Will defer this discussion to next PCP as pt wants to stay on metformin for now. F/u in 3 months. Recommended following up with ophthalmology and podiatry; appointments are pending per pt.

## 2015-03-26 NOTE — Assessment & Plan Note (Signed)
A: Compliant with and tolerating Lipitor. Last lipid panel ~1 year ago.  P: Continue Lipitor 80 mg daily. Repeat CMP and lipid panel today. F/u in 3 months with next DM check-up.

## 2015-03-26 NOTE — Assessment & Plan Note (Signed)
A: Well-controlled with Norvasc, HCTZ, metoprolol. Also on Imdur. No frank side effects or active symptoms. Allergic to ACE.  P: Continue current meds / doses (Norvasc 10 mg daily, HCTZ 25 mg daily, metoprolol 25 mg BID, and Imdur 30 mg daily). F/u in 3 months, monitor routinely. Consider adding ARB for kidney protection, but cost has been an issue in the past.

## 2015-03-26 NOTE — Progress Notes (Signed)
   Subjective:    Patient ID: Terri Estrada, female    DOB: May 31, 1951, 64 y.o.   MRN: [id number]  HPI: Pt presents to clinic to f/u of DM, HTN, and HLD. She reports some continued depression-type symptoms but only when she "thinks about things." She continues to deny SI / HI, and she is seeing a therapist starting tonight Verner Chol Jeannette Corpus, [place], [facility] -- phone [phone], fax [phone]). She is still having some right foot pain but she is seeing a podiatrist in July. Her pain is some better with OTC arch supports.  DM - reports compliance with metformin, taking 500 mg BID - denies side effects / issues with metformin - does not check CBG's at home - denies numbness / tingling and denies frank hypoglycemic symptoms  HTN - reports compliance with Norvasc, HCTZ, and metoprolol as well as Imdur - denies chest pain, SOB, LE swelling  HLD - reports she has been taking Lipitor - denies new muscle or body aches; tolerats Lipitor well but did not tolerate Zocor in the past - reports she has improved exercise habits and is running on a treadmill almost every day  Pt is a former smoker. She still has not yet called anyone to schedule colonoscopy. In addition to the above documentation, pt's PMH, surgical history, FH, and SH all reviewed and updated where appropriate in the EMR. I have also reviewed and updated the pt's allergies and current medications as appropriate.  Review of Systems: As above. Otherwise, full 12-system ROS was reviewed and all negative.     Objective:   Physical Exam BP 124/80 mmHg  Pulse 57  Temp(Src) 98.3 F (36.8 C) (Oral)  Ht 5\' 2"  (1.575 m)  Wt 201 lb (91.173 kg)  BMI 36.75 kg/m2 Gen: well-appearing adult female in NAD HEENT: [place]/AT, sclerae/conjunctivae clear, no lid lag, EOMI, PERRLA   MMM, posterior oropharynx clear, no cervical lymphadenopathy  neck supple with full ROM, no masses appreciated; thyroid not enlarged  Cardio: RRR, no murmur  appreciated; distal pulses intact/symmetric Pulm: CTAB, no wheezes, normal WOB  Abd: soft, nondistended, BS+, no HSM Ext: warm/well-perfused, no cyanosis/clubbing/edema MSK: strength 5/5 in all four extremities, no frank joint deformity/effusion  normal ROM to all four extremities with no point muscle/bony tenderness in spine Neuro/Psych: alert/oriented, sensation grossly intact; normal gait/balance  mood reported depressed with congruent but reactive affect  No SI/HI, AH/VH or obvious delusions Feet: Bilateral skin intact without ulcerations or calluses  Bilateral bunions present, R much larger than L  Sensation mildly diminished throughout to light touch  DP pulses intact / symmetric     Assessment & Plan:  See problem list notes. Continue to encourage scheduling colonoscopy. Screening for HIV (once-lifetime recommended screening) with labs, today.

## 2015-03-27 LAB — HIV ANTIBODY (ROUTINE TESTING W REFLEX): HIV 1&2 Ab, 4th Generation: NONREACTIVE

## 2015-03-29 ENCOUNTER — Encounter: Payer: Self-pay | Admitting: Family Medicine

## 2015-04-06 ENCOUNTER — Other Ambulatory Visit: Payer: Self-pay | Admitting: Family Medicine

## 2015-04-06 DIAGNOSIS — Z1231 Encounter for screening mammogram for malignant neoplasm of breast: Secondary | ICD-10-CM

## 2015-04-28 ENCOUNTER — Ambulatory Visit: Payer: 59 | Admitting: Podiatry

## 2015-05-07 ENCOUNTER — Ambulatory Visit (HOSPITAL_COMMUNITY)
Admission: RE | Admit: 2015-05-07 | Discharge: 2015-05-07 | Disposition: A | Discharge status: Home / Self Care | Payer: 59 | Source: Ambulatory Visit | Attending: Family Medicine | Admitting: Family Medicine

## 2015-05-07 ENCOUNTER — Encounter: Payer: Self-pay | Admitting: Podiatry

## 2015-05-07 ENCOUNTER — Ambulatory Visit (INDEPENDENT_AMBULATORY_CARE_PROVIDER_SITE_OTHER): Payer: 59 | Admitting: Podiatry

## 2015-05-07 VITALS — BP 170/96 | HR 67 | Resp 12

## 2015-05-07 DIAGNOSIS — M2010 Hallux valgus (acquired), unspecified foot: Secondary | ICD-10-CM

## 2015-05-07 DIAGNOSIS — R52 Pain, unspecified: Secondary | ICD-10-CM

## 2015-05-07 DIAGNOSIS — E119 Type 2 diabetes mellitus without complications: Secondary | ICD-10-CM | POA: Diagnosis not present

## 2015-05-07 DIAGNOSIS — M2040 Other hammer toe(s) (acquired), unspecified foot: Secondary | ICD-10-CM

## 2015-05-07 DIAGNOSIS — M2141 Flat foot [pes planus] (acquired), right foot: Secondary | ICD-10-CM

## 2015-05-07 DIAGNOSIS — M2142 Flat foot [pes planus] (acquired), left foot: Secondary | ICD-10-CM

## 2015-05-07 DIAGNOSIS — Z1231 Encounter for screening mammogram for malignant neoplasm of breast: Secondary | ICD-10-CM | POA: Insufficient documentation

## 2015-05-07 NOTE — Progress Notes (Signed)
   Subjective:    Patient ID: Terri Estrada, female    DOB: [birth year], 64 y.o.   MRN: [id number]  HPI  64 year old female presents the office with concerns of bilateral bunion deformity and discoloration to the right first toenail. She states that she is has had pain to the area of bilateral feet and bunions for several months and has been somewhat progressive. Her pain is intermittent in nature. She has pain particularly with rubbing in shoes and certain shoe gear. She is diabetic and her last A1c was 5.9. She denies any history of ulceration or any tingling/numbness. She denies any redness or drainage on toenails. She's had no prior treatment for these issues. No other complaints at this time.  Review of Systems  Constitutional: Positive for unexpected weight change.  Musculoskeletal: Positive for joint swelling and gait problem.       Objective:   Physical Exam AAO x3, NAD DP/PT pulses palpable bilaterally, CRT less than 3 seconds Protective sensation intact with Simms Weinstein monofilament, vibratory sensation intact, Achilles tendon reflex intact There is a decrease in medial arch height upon weightbearing. There is HAV deformity present bilaterally. There is no pain or crepitation with first MTPJ range of motion and there is no hypermobility present. Hammertoe contractures are present bilaterally. She states that her right bunion is more painful than the left. Right hallux toenail slightly dystrophic, discolored and hypertrophic. There is no tenderness along the nail borders. There is no tenderness of the nail at this time. There is no swelling erythema or drainage. No other areas of tenderness to bilateral lower extremities. MMT 5/5, ROM WNL.  No open lesions or pre-ulcerative lesions.  No overlying edema, erythema, increase in warmth to bilateral lower extremities.  No pain with calf compression, swelling, warmth, erythema bilaterally.      Assessment & Plan:  64 year old female  with symptomatic bunion/flatfoot deformity; likely onychomycosis -X-rays were obtained and reviewed with the patient.  -Treatment options discussed including all alternatives, risks, and complications -At her foot structure inducible that should be a good candidate for orthotics. A prescription for orthotics were given the patient for [facility]. I discussed surgical intervention for the bunion. She states in the future she went to have the right side corrected. I will discuss with this would entail. For now continue shoe gear modifications. -In regards to the likely onychomycosis discussed. She would options. She will likely pursue over-the-counter options. I discussed with her Fungi-Nail. -Follow-up after orthotics or sooner if any problems arise. In the meantime, encouraged to call the office with any questions, concerns, change in symptoms. Follow-up with PCP for other issues mentioned and review of systems.  Ovid Curd, DPM

## 2015-05-07 NOTE — Patient Instructions (Signed)
Over the counter treatment for nail fungus is called fungi-nail  

## 2015-05-16 ENCOUNTER — Other Ambulatory Visit: Payer: Self-pay | Admitting: Family Medicine

## 2015-06-12 ENCOUNTER — Other Ambulatory Visit: Payer: Self-pay | Admitting: Family Medicine

## 2015-07-15 ENCOUNTER — Other Ambulatory Visit: Payer: Self-pay | Admitting: Family Medicine

## 2015-07-25 ENCOUNTER — Other Ambulatory Visit: Payer: Self-pay | Admitting: Family Medicine

## 2015-07-29 ENCOUNTER — Other Ambulatory Visit: Payer: Self-pay | Admitting: Family Medicine

## 2015-07-30 ENCOUNTER — Other Ambulatory Visit: Payer: Self-pay | Admitting: Family Medicine

## 2015-07-30 MED ORDER — ISOSORBIDE MONONITRATE ER 30 MG PO TB24: ORAL_TABLET | ORAL | Status: DC

## 2015-07-30 NOTE — Telephone Encounter (Signed)
Pt calling and needs a refill on isosorbide mononitrate (IMDUR) 30 MG 24 hr tablet sent to [facility] [address]. Thank you, Dorothey BasemanSadie Reynolds, ASA

## 2015-08-17 ENCOUNTER — Other Ambulatory Visit: Payer: Self-pay | Admitting: Family Medicine

## 2015-08-19 ENCOUNTER — Other Ambulatory Visit: Payer: Self-pay | Admitting: *Deleted

## 2015-08-20 MED ORDER — AMLODIPINE BESYLATE 10 MG PO TABS: 10.0000 mg | ORAL_TABLET | Freq: Every day | ORAL | Status: DC

## 2015-09-01 ENCOUNTER — Ambulatory Visit (INDEPENDENT_AMBULATORY_CARE_PROVIDER_SITE_OTHER): Payer: 59 | Admitting: Family Medicine

## 2015-09-01 ENCOUNTER — Encounter: Payer: Self-pay | Admitting: Family Medicine

## 2015-09-01 VITALS — BP 120/84 | HR 53 | Temp 97.5°F | Ht 62.0 in | Wt 198.9 lb

## 2015-09-01 DIAGNOSIS — Z23 Encounter for immunization: Secondary | ICD-10-CM

## 2015-09-01 DIAGNOSIS — Z0000 Encounter for general adult medical examination without abnormal findings: Secondary | ICD-10-CM | POA: Diagnosis not present

## 2015-09-01 DIAGNOSIS — E119 Type 2 diabetes mellitus without complications: Secondary | ICD-10-CM

## 2015-09-01 LAB — POCT GLYCOSYLATED HEMOGLOBIN (HGB A1C): Hemoglobin A1C: 5.9

## 2015-09-01 NOTE — Patient Instructions (Addendum)
Thank you so much for coming to visit today! You are up to date on your labs and tests. Please continue your current medications. Please contact me with the refills you need and I'll be glad to send them in to the pharmacy. Please follow up in six months for a diabetes check and your pap smear.  Thanks again! Dr. Caroleen Hammanumley

## 2015-09-02 MED ORDER — METFORMIN HCL 1000 MG PO TABS: 500.0000 mg | ORAL_TABLET | Freq: Two times a day (BID) | ORAL | Status: DC

## 2015-09-02 NOTE — Assessment & Plan Note (Signed)
-   A1C stable at 5.9 - Continue Metformin 500mg  BID - Continue to work on diet and exercise - Follow up in six months for A1C check

## 2015-09-02 NOTE — Progress Notes (Signed)
Subjective:     Patient ID: Terri Estrada, female   DOB: Jun 26, 1951, 64 y.o.   MRN: [id number]  HPI Terri Estrada is a 64yo female presenting today for diabetes follow up. # Diabetes: - A1C 5.9 at last visit - Reports recent visit to eye doctor where she was diagnosed with small cataracts - Currently takes Metformin 500mg  twice daily - Denies numbness in feet  # Health Maintenance: - Reports last colonoscopy in 2009 and instructed to follow up in 10 years - Last mammogram 04/2015 - Last pap smear 12/2012 with next due in 12/2015  - Medications and problem list reviewed and updated.  Review of Systems Per HPI    Objective:   Physical Exam  Constitutional: She appears well-developed and well-nourished. No distress.  HENT:  Head: Normocephalic.  Mouth/Throat: No oropharyngeal exudate.  Eyes: Pupils are equal, round, and reactive to light. Right eye exhibits no discharge. Left eye exhibits no discharge.  Neck: No thyromegaly present.  Cardiovascular: Normal rate.  Exam reveals no gallop and no friction rub.   No murmur heard. Pedal pulses palpable bilaterally  Pulmonary/Chest: Effort normal. No respiratory distress. She has no wheezes.  Abdominal: Soft. She exhibits no distension. There is no tenderness.  Musculoskeletal: She exhibits no edema.  Neurological: She is alert.  - Sensation intact in feet bilaterally  Skin: No rash noted.  No ulcers or calluses noted on feet  Psychiatric: She has a normal mood and affect. Her behavior is normal.       Assessment and Plan:     Diabetes mellitus type 2, controlled, without complications - A1C stable at 5.9 - Continue Metformin 500mg  BID - Continue to work on diet and exercise - Follow up in six months for A1C check  Health maintenance examination - Pap Smear due in March 2017. If normal will be last pap smear!

## 2015-09-02 NOTE — Assessment & Plan Note (Addendum)
-   Pap Smear due in March 2017. If normal will be last pap smear!

## 2015-09-13 ENCOUNTER — Encounter: Payer: Self-pay | Admitting: Podiatry

## 2015-09-13 ENCOUNTER — Ambulatory Visit (INDEPENDENT_AMBULATORY_CARE_PROVIDER_SITE_OTHER): Payer: 59 | Admitting: Podiatry

## 2015-09-13 VITALS — BP 148/87 | HR 57 | Resp 18

## 2015-09-13 DIAGNOSIS — L600 Ingrowing nail: Secondary | ICD-10-CM | POA: Diagnosis not present

## 2015-09-13 MED ORDER — CEPHALEXIN 500 MG PO CAPS: 500.0000 mg | ORAL_CAPSULE | Freq: Three times a day (TID) | ORAL | Status: DC

## 2015-09-13 NOTE — Patient Instructions (Signed)

## 2015-09-14 DIAGNOSIS — L600 Ingrowing nail: Secondary | ICD-10-CM | POA: Insufficient documentation

## 2015-09-14 NOTE — Progress Notes (Signed)
Patient ID: Terri Estrada, female   DOB: September 04, 1951, 64 y.o.   MRN: [id number]  Subjective: 64 year old female presents the office or concerns or right hallux ingrown toenail along both nail borders. She states the areas. Pain pretty the pressure in shoe gear. Denies any redness or red streaks or any drainage. She has tried trimming toenail herself without any relief. No other complaints.  Objective:  Objective: AAO x3, NAD DP/PT pulses palpable bilaterally, CRT less than 3 seconds Protective sensation intact with Simms Weinstein monofilament, vibratory sensation intact, Achilles tendon reflex intact On the right hallux toenail along both the medial and lateral aspect of the toenail or significant aggravation with tenderness over the area. There is no overlying edema, erythema, drainage/purulence, ascending cellulitis. No other areas of tenderness bilateral lower extremities. HAV deformity is present. Flatfoot deformity is also present. No areas of pinpoint bony tenderness or pain with vibratory sensation. MMT 5/5, ROM WNL. No edema, erythema, increase in warmth to bilateral lower extremities.  No open lesions or pre-ulcerative lesions.  No pain with calf compression, swelling, warmth, erythema  Assessment: 64 year old female with symptomatic right hallux ingrown toenail  Plan: -All treatment options discussed with the patient including all alternatives, risks, complications.  -At this time, the patient is requesting partial nail removal with chemical matricectomy to the symptomatic portion of the nail. Risks and complications were discussed with the patient for which they understand and  verbally consent to the procedure. Under sterile conditions a total of 3 mL of a mixture of 2% lidocaine plain and 0.5% Marcaine plain was infiltrated in a hallux block fashion. Once anesthetized, the skin was prepped in sterile fashion. A tourniquet was then applied. Next the medial and lateral aspect of  hallux nail border was then sharply excised making sure to remove the entire offending nail border. Once the nails were ensured to be removed area was debrided and the underlying skin was intact. There is no purulence identified in the procedure. Next phenol was then applied under standard conditions and copiously irrigated. Silvadene was applied. A dry sterile dressing was applied. After application of the dressing the tourniquet was removed and there is found to be an immediate capillary refill time to the digit. The patient tolerated the procedure well any complications. Post procedure instructions were discussed the patient for which he verbally understood. Follow-up in one week for nail check or sooner if any problems are to arise. Discussed signs/symptoms of infection and directed to call the office immediately should any occur or go directly to the emergency room. In the meantime, encouraged to call the office with any questions, concerns, changes symptoms. -Patient encouraged to call the office with any questions, concerns, change in symptoms.   Ovid CurdMatthew Wagoner, DPM

## 2015-09-22 ENCOUNTER — Other Ambulatory Visit: Payer: Self-pay | Admitting: Family Medicine

## 2015-09-27 ENCOUNTER — Ambulatory Visit (INDEPENDENT_AMBULATORY_CARE_PROVIDER_SITE_OTHER): Payer: 59 | Admitting: Podiatry

## 2015-09-27 DIAGNOSIS — Z794 Long term (current) use of insulin: Secondary | ICD-10-CM | POA: Diagnosis not present

## 2015-09-27 DIAGNOSIS — E119 Type 2 diabetes mellitus without complications: Secondary | ICD-10-CM

## 2015-09-27 DIAGNOSIS — L600 Ingrowing nail: Secondary | ICD-10-CM

## 2015-09-27 DIAGNOSIS — Z9889 Other specified postprocedural states: Secondary | ICD-10-CM | POA: Diagnosis not present

## 2015-09-27 NOTE — Patient Instructions (Signed)

## 2015-09-27 NOTE — Progress Notes (Signed)
Patient ID: Terri Estrada, female   DOB: April 30, 1951, 64 y.o.   MRN: [id number]  Subjective: Terri Estrada is a 64 y.o.  Female returns to office today for follow up evaluation after having right Hallux right medial and lateral permanent nail avulsion performed. Patient has been soaking using epsom and applying topical antibiotic covered with bandaid daily. Denies any pain or surrounding erythema or drainage. Patient denies fevers, chills, nausea, vomiting. Denies any calf pain, chest pain, SOB.   Objective:  Vitals: Reviewed  General: Well developed, nourished, in no acute distress, alert and oriented x3   Dermatology: Skin is warm, dry and supple bilateral. Right  hallux nail border appears to be clean, dry, with mild granular tissue and surrounding scab. There is no surrounding erythema, edema, drainage/purulence. The remaining nails appear unremarkable at this time. There are no other lesions or other signs of infection present.  Neurovascular status: Intact. No lower extremity swelling; No pain with calf compression bilateral.  Musculoskeletal: Decreased tenderness to palpation of the medialand lateral hallux nail folds. Muscular strength within normal limits bilateral.   Assesement and Plan: S/p partial nail avulsion, doing well.   -Continue soaking in epsom salts twice a day followed by antibiotic ointment and a band-aid. Can leave uncovered at night. Continue this until completely healed.  -If the area has not healed in 2 weeks, call the office for follow-up appointment, or sooner if any problems arise.  -Monitor for any signs/symptoms of infection. Call the office immediately if any occur or go directly to the emergency room. Call with any questions/concerns.  Ovid CurdMatthew Wagoner, DPM

## 2015-09-28 ENCOUNTER — Other Ambulatory Visit: Payer: Self-pay | Admitting: Family Medicine

## 2015-09-28 MED ORDER — ATORVASTATIN CALCIUM 80 MG PO TABS: 80.0000 mg | ORAL_TABLET | Freq: Every day | ORAL | Status: DC

## 2015-09-28 NOTE — Telephone Encounter (Signed)
Pt called and needs a refill on her Lipitor called in. The original request came but addressed to Dr. Casper HarrisonStreet. Please [facility] the [facility] [address] [place]. jw

## 2015-09-28 NOTE — Telephone Encounter (Signed)
Done

## 2015-09-30 ENCOUNTER — Telehealth: Payer: Self-pay | Admitting: Family Medicine

## 2015-09-30 ENCOUNTER — Other Ambulatory Visit: Payer: Self-pay | Admitting: *Deleted

## 2015-09-30 MED ORDER — HYDROCHLOROTHIAZIDE 25 MG PO TABS: 25.0000 mg | ORAL_TABLET | Freq: Every day | ORAL | Status: DC

## 2015-09-30 MED ORDER — ATORVASTATIN CALCIUM 80 MG PO TABS: 80.0000 mg | ORAL_TABLET | Freq: Every day | ORAL | Status: DC

## 2015-09-30 NOTE — Telephone Encounter (Signed)
Rxs resent as requested.  LMOVM informing pt. Fleeger, Terri RochesterJessica Dawn

## 2015-09-30 NOTE — Telephone Encounter (Signed)
[facility] and [facility] for [facility] Hydrochlorothiazide and Lipitor. We sent them electronically on 12/20. Can we re-send these. Also can we transfer the Lipitor to [facility] [address] [facility] jw

## 2015-10-06 ENCOUNTER — Ambulatory Visit (INDEPENDENT_AMBULATORY_CARE_PROVIDER_SITE_OTHER): Payer: 59 | Admitting: Cardiology

## 2015-10-06 ENCOUNTER — Encounter: Payer: Self-pay | Admitting: Cardiology

## 2015-10-06 VITALS — BP 138/86 | HR 61 | Ht 62.0 in | Wt 198.8 lb

## 2015-10-06 DIAGNOSIS — E785 Hyperlipidemia, unspecified: Secondary | ICD-10-CM | POA: Diagnosis not present

## 2015-10-06 DIAGNOSIS — I2510 Atherosclerotic heart disease of native coronary artery without angina pectoris: Secondary | ICD-10-CM

## 2015-10-06 DIAGNOSIS — I10 Essential (primary) hypertension: Secondary | ICD-10-CM

## 2015-10-06 DIAGNOSIS — R011 Cardiac murmur, unspecified: Secondary | ICD-10-CM | POA: Diagnosis not present

## 2015-10-06 MED ORDER — ASPIRIN EC 81 MG PO TBEC: 81.0000 mg | DELAYED_RELEASE_TABLET | Freq: Every day | ORAL | Status: DC

## 2015-10-06 NOTE — Progress Notes (Signed)
Patient ID: Terri Estrada, female   DOB: 07/30/1951, 64 y.o.   MRN: [id number]      Cardiology Office Note  Date:  10/06/2015   ID:  Terri Estrada, DOB August 17, 1951, MRN [id number]  PCP:  Garry Heater, DO  Cardiologist:  Lars Masson, MD   Chief complain: 3 years follow up   History of Present Illness: Terri Estrada is a 64 y.o. female who presents for follow up, she is a former Dr Daleen Squibb patient and hasn't followed in 3 years as she ost her insurance. She is very complaint with her meds, denies chest pain, DOE, palpitations, claudications, syncope, no orthopnea or PND. No muscle pain with high dose atorvastatin. She is followed by her PCP and th latest lipids in 5/216 were all at goal. She has a distant h/o MI in 2001 with stenting, felt like heart burn at that time, no symptoms since then. Able to perform all the ADL.   Past Medical History  Diagnosis Date  . Type II or unspecified type diabetes mellitus without mention of complication, not stated as uncontrolled   . Other and unspecified hyperlipidemia   . Essential hypertension, benign   . Obesity, unspecified   . Coronary atherosclerosis of unspecified type of vessel, native or graft   . Sickle-cell trait (HCC)   . Arthritis   . Diverticulosis of colon (without mention of hemorrhage)   . Aortic stenosis   . MI (myocardial infarction) (HCC)   . Osteoarthrosis, unspecified whether generalized or localized, unspecified site   . Onychia and paronychia of toe   . Streptococcal sore throat     Past Surgical History  Procedure Laterality Date  . Carotid stent  09/21/00  . Angioplasty  09/21/00     Current Outpatient Prescriptions  Medication Sig Dispense Refill  . amLODipine (NORVASC) 10 MG tablet Take 1 tablet (10 mg total) by mouth daily. 90 tablet 2  . aspirin 325 MG tablet Take 325 mg by mouth daily.      Marland Kitchen atorvastatin (LIPITOR) 80 MG tablet Take 1 tablet (80 mg total) by mouth daily. 90 tablet 3  . Calcium  Carbonate (CALCIUM 600) 1500 MG TABS Take 600 mg by mouth 2 (two) times daily.     . cephALEXin (KEFLEX) 500 MG capsule Take 1 capsule (500 mg total) by mouth 3 (three) times daily. 30 capsule 2  . hydrochlorothiazide (HYDRODIURIL) 25 MG tablet Take 1 tablet (25 mg total) by mouth daily. 90 tablet 3  . isosorbide mononitrate (IMDUR) 30 MG 24 hr tablet TAKE 1 TABLET (30 MG TOTAL) BY MOUTH DAILY. 90 tablet 3  . metFORMIN (GLUCOPHAGE) 1000 MG tablet Take 0.5 tablets (500 mg total) by mouth 2 (two) times daily with a meal. 180 tablet 1  . metoprolol tartrate (LOPRESSOR) 25 MG tablet TAKE 1 TABLET (25 MG TOTAL) BY MOUTH 2 (TWO) TIMES DAILY. 180 tablet 1  . Multiple Vitamins-Minerals (MULTIVITAMINS THER. W/MINERALS) TABS Take 1 tablet by mouth daily.      . Omega-3 Fatty Acids (FISH OIL) 1200 MG CAPS Take 1 capsule by mouth 2 (two) times daily.      Marland Kitchen zoster vaccine live, PF, (ZOSTAVAX) 01027 UNT/0.65ML injection Inject 19,400 Units into the skin once. 1 each 0   No current facility-administered medications for this visit.    Allergies:   Ace inhibitors and Zocor    Social History:  The patient  reports that she has quit smoking. She has never used smokeless tobacco.  She reports that she does not drink alcohol or use illicit drugs.   Family History:  The patient's family history includes Diabetes type II in her mother and sister; Hypertension in her mother; Sickle cell anemia in her daughter.    ROS:  Please see the history of present illness.   Otherwise, review of systems are positive for none.   All other systems are reviewed and negative.    PHYSICAL EXAM: VS:  BP 138/86 mmHg  Pulse 61  Ht 5\' 2"  (1.575 m)  Wt 198 lb 12.8 oz (90.175 kg)  BMI 36.35 kg/m2 , BMI Body mass index is 36.35 kg/(m^2). GEN: Well nourished, well developed, in no acute distress HEENT: normal Neck: no JVD, carotid bruits, or masses Cardiac: RRR; 3/6 systolic murmur, rubs, or gallops,no edema  Respiratory:  clear  to auscultation bilaterally, normal work of breathing GI: soft, nontender, nondistended, + BS MS: no deformity or atrophy Skin: warm and dry, no rash Neuro:  Strength and sensation are intact Psych: euthymic mood, full affect  EKG:  EKG is ordered today. The ekg ordered today demonstrates SR, anterior and inferior   Recent Labs: 03/26/2015: ALT 24; BUN 14; Creat 0.74; Potassium 4.3; Sodium 143   Lipid Panel    Component Value Date/Time   CHOL 117 03/26/2015 1033   TRIG 71 03/26/2015 1033   HDL 48 03/26/2015 1033   CHOLHDL 2.4 03/26/2015 1033   VLDL 14 03/26/2015 1033   LDLCALC 55 03/26/2015 1033   LDLDIRECT 63 03/15/2012 1520      Wt Readings from Last 3 Encounters:  10/06/15 198 lb 12.8 oz (90.175 kg)  09/01/15 198 lb 14.4 oz (90.22 kg)  03/26/15 201 lb (91.173 kg)      ASSESSMENT AND PLAN:  1.  CAD, h/o MI in 2001, stable, asymptomatic, on high dose atorvastatin, decrease aspirin from 325 to 81 mg po daily. Continue metoprolol. ECG unchanged from prior but shows old anterior and inferior MI< we will order and echocardiogram to evluate for LVEF, no prior in the system.  2. Hypertension - well controlled.  3. Hyperlipidemia - all lipids at goal.  4. Murmur - order echocardiogram.  Follow up in 1 year.  Signed, Lars MassonNELSON, Sadye Kiernan H, MD  10/06/2015 2:21 PM    [facility] [address], [place], [place]  [address] Phone: [phone]; Fax: [phone]

## 2015-10-06 NOTE — Patient Instructions (Signed)
Medication Instructions:   STOP TAKING ASPIRIN 325 MG NOW  START TAKING ASPIRIN 81 MG ONCE DAILY    Testing/Procedures:  Your physician has requested that you have an echocardiogram. Echocardiography is a painless test that uses sound waves to create images of your heart. It provides your doctor with information about the size and shape of your heart and how well your heart's chambers and valves are working. This procedure takes approximately one hour. There are no restrictions for this procedure.    Follow-Up:  Your physician wants you to follow-up in: ONE YEAR WITH DR Johnell ComingsNELSON You will receive a reminder letter in the mail two months in advance. If you don't receive a letter, please call our office to schedule the follow-up appointment.     If you need a refill on your cardiac medications before your next appointment, please call your pharmacy.

## 2015-10-25 ENCOUNTER — Other Ambulatory Visit: Payer: Self-pay | Admitting: *Deleted

## 2015-10-26 MED ORDER — AMLODIPINE BESYLATE 10 MG PO TABS: 10.0000 mg | ORAL_TABLET | Freq: Every day | ORAL | Status: DC

## 2015-12-10 ENCOUNTER — Telehealth: Payer: Self-pay | Admitting: Cardiology

## 2015-12-10 DIAGNOSIS — R011 Cardiac murmur, unspecified: Secondary | ICD-10-CM

## 2015-12-10 NOTE — Telephone Encounter (Signed)
New message ° ° °Pt calling to speak to rn °

## 2015-12-10 NOTE — Telephone Encounter (Addendum)
Pt simply calling in to reschedule her echo, that was originally ordered at her OV with Dr Delton SeeNelson on 10/06/15.  Pt states she had to hold off on scheduling this study, due to insurance issues.  Pt requesting to have her echo scheduled for 01/20/16, at anytime.  Noted in Dr Lindaann SloughNelson's OV note on 10/06/15, she ordered the echo to assess LVEF function and murmur.  Placed the order back in the system and scheduled the pt to have her echo on 01/20/16 at [address] location, as requested by the pt.  Pt aware of appt date and time.  Pt verbalized understanding, agrees with this plan, and gracious for all the assistance provided.

## 2016-01-20 ENCOUNTER — Other Ambulatory Visit: Payer: Self-pay

## 2016-01-20 ENCOUNTER — Ambulatory Visit (HOSPITAL_COMMUNITY): Payer: BLUE CROSS/BLUE SHIELD | Attending: Cardiovascular Disease

## 2016-01-20 DIAGNOSIS — R011 Cardiac murmur, unspecified: Secondary | ICD-10-CM | POA: Diagnosis not present

## 2016-01-20 DIAGNOSIS — Z6836 Body mass index (BMI) 36.0-36.9, adult: Secondary | ICD-10-CM | POA: Insufficient documentation

## 2016-01-20 DIAGNOSIS — I371 Nonrheumatic pulmonary valve insufficiency: Secondary | ICD-10-CM | POA: Diagnosis not present

## 2016-01-20 DIAGNOSIS — E119 Type 2 diabetes mellitus without complications: Secondary | ICD-10-CM | POA: Diagnosis not present

## 2016-01-20 DIAGNOSIS — I071 Rheumatic tricuspid insufficiency: Secondary | ICD-10-CM | POA: Insufficient documentation

## 2016-01-20 DIAGNOSIS — I119 Hypertensive heart disease without heart failure: Secondary | ICD-10-CM | POA: Insufficient documentation

## 2016-01-20 DIAGNOSIS — I358 Other nonrheumatic aortic valve disorders: Secondary | ICD-10-CM | POA: Insufficient documentation

## 2016-01-20 DIAGNOSIS — E785 Hyperlipidemia, unspecified: Secondary | ICD-10-CM | POA: Insufficient documentation

## 2016-01-20 DIAGNOSIS — E669 Obesity, unspecified: Secondary | ICD-10-CM | POA: Diagnosis not present

## 2016-01-20 DIAGNOSIS — D573 Sickle-cell trait: Secondary | ICD-10-CM | POA: Insufficient documentation

## 2016-01-20 DIAGNOSIS — I340 Nonrheumatic mitral (valve) insufficiency: Secondary | ICD-10-CM | POA: Insufficient documentation

## 2016-03-29 ENCOUNTER — Other Ambulatory Visit: Payer: Self-pay | Admitting: *Deleted

## 2016-03-30 MED ORDER — METFORMIN HCL 1000 MG PO TABS: 500.0000 mg | ORAL_TABLET | Freq: Two times a day (BID) | ORAL | Status: DC

## 2016-04-10 ENCOUNTER — Encounter: Payer: Self-pay | Admitting: Family Medicine

## 2016-04-10 ENCOUNTER — Ambulatory Visit (INDEPENDENT_AMBULATORY_CARE_PROVIDER_SITE_OTHER): Payer: BLUE CROSS/BLUE SHIELD | Admitting: Family Medicine

## 2016-04-10 VITALS — BP 135/64 | HR 54 | Temp 98.2°F | Ht 62.0 in | Wt 203.6 lb

## 2016-04-10 DIAGNOSIS — E119 Type 2 diabetes mellitus without complications: Secondary | ICD-10-CM

## 2016-04-10 DIAGNOSIS — M170 Bilateral primary osteoarthritis of knee: Secondary | ICD-10-CM

## 2016-04-10 DIAGNOSIS — L03011 Cellulitis of right finger: Secondary | ICD-10-CM

## 2016-04-10 DIAGNOSIS — R69 Illness, unspecified: Secondary | ICD-10-CM

## 2016-04-10 DIAGNOSIS — I10 Essential (primary) hypertension: Secondary | ICD-10-CM

## 2016-04-10 DIAGNOSIS — Z0000 Encounter for general adult medical examination without abnormal findings: Secondary | ICD-10-CM

## 2016-04-10 LAB — COMPREHENSIVE METABOLIC PANEL
ALT: 19 U/L (ref 6–29)
AST: 26 U/L (ref 10–35)
Albumin: 4.6 g/dL (ref 3.6–5.1)
Alkaline Phosphatase: 69 U/L (ref 33–130)
BUN: 13 mg/dL (ref 7–25)
CALCIUM: 10.1 mg/dL (ref 8.6–10.4)
CHLORIDE: 101 mmol/L (ref 98–110)
CO2: 29 mmol/L (ref 20–31)
Creat: 0.6 mg/dL (ref 0.50–0.99)
GLUCOSE: 86 mg/dL (ref 65–99)
POTASSIUM: 4.1 mmol/L (ref 3.5–5.3)
Sodium: 139 mmol/L (ref 135–146)
Total Bilirubin: 0.4 mg/dL (ref 0.2–1.2)
Total Protein: 7.4 g/dL (ref 6.1–8.1)

## 2016-04-10 LAB — POCT GLYCOSYLATED HEMOGLOBIN (HGB A1C): Hemoglobin A1C: 6

## 2016-04-10 MED ORDER — TRAMADOL HCL 50 MG PO TABS: 50.0000 mg | ORAL_TABLET | Freq: Three times a day (TID) | ORAL | Status: DC | PRN

## 2016-04-10 MED ORDER — METFORMIN HCL 1000 MG PO TABS: 1000.0000 mg | ORAL_TABLET | Freq: Two times a day (BID) | ORAL | Status: DC

## 2016-04-10 NOTE — Patient Instructions (Addendum)
Thank you so much for coming to visit today! Your A1C is 6 today. Please return in November 2017 for another A1C check. We will increase your Metformin to 1000mg  (one tablet) twice a day. Please follow up with Ophthalmology. Please continue to work on diet and exercise.  I have refilled your Tramadol to help with the knee pain. Please use Tylenol or Ibuprofen to help with the pain and inflammation.  We will obtain an CMP today to check your kidney and liver function. We will also check your urine for protein, which can occur with diabetes.  Please follow up with Cardiology and Podiatry as scheduled.  Thanks again! Dr. Caroleen Hammanumley

## 2016-04-11 LAB — PROTEIN / CREATININE RATIO, URINE
Creatinine, Urine: 56 mg/dL (ref 20–320)
PROTEIN CREATININE RATIO: 161 mg/g{creat} (ref 21–161)
TOTAL PROTEIN, URINE: 9 mg/dL (ref 5–24)

## 2016-04-11 NOTE — Assessment & Plan Note (Signed)
Stable.  Continue current medications.

## 2016-04-11 NOTE — Assessment & Plan Note (Addendum)
-   A1C 6, increased from 5.9 in 08/2015 - Will increase Metformin to 1000mg  BID - Will check protein-creatinine ratio - Continue to work on diet and exercise - Follow up with Podiatry. Encouraged to contact insurance to see which Ophthalmologists are covered. - Follow up in 6months for A1C check

## 2016-04-11 NOTE — Progress Notes (Signed)
Subjective:     Patient ID: Terri Estrada, female   DOB: 20-Jul-1951, 65 y.o.   MRN: [id number]  HPI Terri Estrada is a 65yo female presenting today for general exam to follow up of diabetes and hypertension. Also with knee osteoarthritis flare of left knee.  # Hypertension: - Currently prescribed Amlodipine, HCTZ, Imdur, and Metoprolol. Also prescribed Aspirin 81mg  - Follows with Cardiology, last visit in 09/2015, next due 09/2016 - Echocardiogram in 01/2016 with EF 60-65%, grade 1 diastolic dysfunction - Denies headache, shortness of breath, chest pain, dyspnea  # Diabetes: - Last A1C 5.9 in 08/2015 - Currently prescribed Metformin 500mg  BID - Follows up with Podiatry in 09/2016 - Looking for another Ophthalmologist. Plans to call insurance company to see what offices they cover.  # Left Knee Osteoarthritis: - Chronic history with current flare lasting approximately one week - Makes it difficult for her to exercise to lose weight and help her other comorbidities. Has been trying to use the bike more often instead of the treadmill, which has helped. - Has tried knee injections without relief. Does not wish to try again. - Has been using Tylenol Arthritis with some relief, however wishes to have another medication for breakthrough when needed given acute flare. Has used Tramadol in the past, which has worked well for her. - Also of note, she complains of inflammation on top of the fourth toe of her right foot. Has been improving on its own. - Xrays from 2013 with:  Left: Advanced tricompartmental osteoarthritis most severe in medial compartment where there is complete loss of joint space  Right: Tricompartmental osteoarthritis most severe in the medial and patellofemoral compartments.  # Health Maintenance: - Last colonoscopy in 2009, next due in 2019 - Last pap smear in 12/2012, Currently due. Refuses today and requests to follow up later this year to have this done. - Last mammogram in  04/2015, Currently due - Former Smoker  Review of Systems Per HPI. Other systems negative.    Objective:   Physical Exam  Constitutional: She appears well-developed and well-nourished. No distress.  HENT:  Head: Normocephalic and atraumatic.  Cardiovascular: Normal rate and regular rhythm.   Murmur heard. Pulmonary/Chest: Effort normal. No respiratory distress. She has no wheezes.  Abdominal: Soft. She exhibits no distension. There is no tenderness.  Musculoskeletal:  Crepitus noted over knees bilaterally especially medially, No jointline tenderness in knees, Negative Thessaly's bilaterally  Skin: No rash noted.  Paronychia of 4th digit of right foot noted  Psychiatric: She has a normal mood and affect. Her behavior is normal.      Assessment and Plan:       HYPERTENSION, BENIGN SYSTEMIC - Stable - Continue current medications  Diabetes mellitus type 2, controlled, without complications - A1C 6, increased from 5.9 in 08/2015 - Will increase Metformin to 1000mg  BID - Will check protein-creatinine ratio - Continue to work on diet and exercise - Follow up with Podiatry. Encouraged to contact insurance to see which Ophthalmologists are covered. - Follow up in 6months for A1C check  Osteoarthritis - Current flare of left knee osteoarthritis. Hopeful that meniscus is not impacted at this time. - Continue Tylenol Arthritis - Refill of Tramadol given. Discussed that this is not a chronic medication.  Health maintenance examination - Next colonoscopy 2019 - Due for mammogram. Plans to schedule - Due for pap smear. Refuses at this visit. Would like done at next office visit - Will check CMP given chronic medications and use of  Tylenol Arthritis    Paronychia - Right 4th toe of foot - Continue to monitor. Ancitipate it will continue to improve on its own. - To follow up if worsens or fails to resolve.

## 2016-04-11 NOTE — Assessment & Plan Note (Addendum)
-   Current flare of left knee osteoarthritis. Hopeful that meniscus is not impacted at this time. - Continue Tylenol Arthritis - Refill of Tramadol given. Discussed that this is not a chronic medication.

## 2016-04-11 NOTE — Assessment & Plan Note (Addendum)
-   Next colonoscopy 2019 - Due for mammogram. Plans to schedule - Due for pap smear. Refuses at this visit. Would like done at next office visit - Will check CMP given chronic medications and use of Tylenol Arthritis

## 2016-04-12 ENCOUNTER — Other Ambulatory Visit: Payer: Self-pay | Admitting: Family Medicine

## 2016-04-12 DIAGNOSIS — Z1231 Encounter for screening mammogram for malignant neoplasm of breast: Secondary | ICD-10-CM

## 2016-04-21 ENCOUNTER — Encounter: Payer: Self-pay | Admitting: Family Medicine

## 2016-05-08 ENCOUNTER — Ambulatory Visit
Admission: RE | Admit: 2016-05-08 | Discharge: 2016-05-08 | Disposition: A | Discharge status: Home / Self Care | Payer: BLUE CROSS/BLUE SHIELD | Source: Ambulatory Visit | Attending: Family Medicine | Admitting: Family Medicine

## 2016-05-08 DIAGNOSIS — Z1231 Encounter for screening mammogram for malignant neoplasm of breast: Secondary | ICD-10-CM

## 2016-06-26 ENCOUNTER — Other Ambulatory Visit: Payer: Self-pay | Admitting: *Deleted

## 2016-06-26 MED ORDER — ISOSORBIDE MONONITRATE ER 30 MG PO TB24: ORAL_TABLET | ORAL | 3 refills | Status: DC

## 2016-07-26 ENCOUNTER — Other Ambulatory Visit: Payer: Self-pay | Admitting: *Deleted

## 2016-07-27 MED ORDER — AMLODIPINE BESYLATE 10 MG PO TABS: 10.0000 mg | ORAL_TABLET | Freq: Every day | ORAL | 2 refills | Status: DC

## 2016-08-17 NOTE — Progress Notes (Signed)
Subjective:     Patient ID: Terri Estrada, female   DOB: 27-Nov-1950, 65 y.o.   MRN: [id number]  HPI Terri Estrada is a 65yo female presenting today for follow up of hypertension and diabetes.   # Hypertension: - Currently prescribed amlodipine, hydrochlorothiazide, Imdur, and metoprolol.  - Follows a cardiology, next visit December 2017.  - History of echocardiogram in April 2017 with EF 60-65%, grade 1 diastolic dysfunction.  # Diabetes: - Last A1C 6 in 04/2016. - Currently prescribed Metformin 1000mg  twice daily. Reports taking 1000mg  in the morning and 500mg  at night (not able to tolerate higher dose at night) - Follows up with Podiatry in 09/2016 - Last visit had plans to schedule with Ophthalmology - Also reporting dry skin, thin hair, increased sensitivity to cold. Has sister with thyroid problem.   # Urinary Incontinence: - Has long history of urinary incontinence, suspected to be secondary to Urge. - Reports she has been evaluated by her Gynecologist and told her Bladder prolapses. Recommended following up with Urology - Would like referral to Urology  # Health Maintenance: - Last colonoscopy 2009, Next due in 2019 - Last pap smear 12/2012. Refused at last visit and refuses again today, stating she would like to follow up with Urology before pap smear. Notes that her insurance is about to fall through and she will need Urology and pap smear before January. After the beginning of the year, she will not be able to return until September when she has insurance. - Mammogram 04/2016 - Has never been tested for Hepatitis C. Requests testing today since she was born in [id number]. - Former smoker  Review of Systems Per HPI    Objective:   Physical Exam  Constitutional: She appears well-developed and well-nourished. No distress.  HENT:  Head: Normocephalic and atraumatic.  Cardiovascular: Normal rate and regular rhythm.   No murmur heard. Pedal pulses bilaterally  Pulmonary/Chest:  Effort normal. No respiratory distress. She has no wheezes.  Abdominal: She exhibits no distension. There is no tenderness.  Neurological:  Sensation intact over feet bilaterally  Skin:  Calluses noted bilaterally  Psychiatric: She has a normal mood and affect. Her behavior is normal.      Assessment and Plan:     HYPERTENSION, BENIGN SYSTEMIC - Well controlled. Continue Amlodipine, HCTZ, Imdur, and Metoprolol.   Diabetes mellitus type 2, controlled, without complications - A1C 5.8 - Continue Metformin 1000mg  in morning, 500mg  at night - Will check TSH given increased risk of thyroid problems with diabetes and reports of skin and hair changes, cold intolerance - Follow up in 6months for next A1C. Note that she may not be able to return until September 2018  Female cystocele - Referral to Urology placed.   Health maintenance examination - Refusing pap smear until after Urology evaluation. Encouraged to try to have it done prior to January. - Next mammogram due 04/2017 - Next colonoscopy due 2019 - Will check Hepatitis C

## 2016-08-18 ENCOUNTER — Ambulatory Visit (INDEPENDENT_AMBULATORY_CARE_PROVIDER_SITE_OTHER): Payer: BLUE CROSS/BLUE SHIELD | Admitting: Family Medicine

## 2016-08-18 ENCOUNTER — Encounter: Payer: Self-pay | Admitting: Family Medicine

## 2016-08-18 VITALS — BP 121/82 | HR 58 | Temp 98.0°F | Ht 62.0 in | Wt 202.4 lb

## 2016-08-18 DIAGNOSIS — R3915 Urgency of urination: Secondary | ICD-10-CM

## 2016-08-18 DIAGNOSIS — N8110 Cystocele, unspecified: Secondary | ICD-10-CM

## 2016-08-18 DIAGNOSIS — Z23 Encounter for immunization: Secondary | ICD-10-CM | POA: Diagnosis not present

## 2016-08-18 DIAGNOSIS — I10 Essential (primary) hypertension: Secondary | ICD-10-CM | POA: Diagnosis not present

## 2016-08-18 DIAGNOSIS — E119 Type 2 diabetes mellitus without complications: Secondary | ICD-10-CM | POA: Diagnosis not present

## 2016-08-18 DIAGNOSIS — Z0000 Encounter for general adult medical examination without abnormal findings: Secondary | ICD-10-CM

## 2016-08-18 LAB — POCT GLYCOSYLATED HEMOGLOBIN (HGB A1C): HEMOGLOBIN A1C: 5.8

## 2016-08-18 LAB — TSH: TSH: 0.82 m[IU]/L

## 2016-08-18 NOTE — Patient Instructions (Signed)
Thank you so much for coming to visit today! Your A1C is improved at 5.8. We will check several labs today and we will mail you a letter with the results. I have placed a referral to Urology. Please return for a pap smear following your urology appointment.   Dr. Caroleen Hammanumley

## 2016-08-19 LAB — HEPATITIS C ANTIBODY: HCV Ab: NEGATIVE

## 2016-08-20 NOTE — Assessment & Plan Note (Addendum)
-   Refusing pap smear until after Urology evaluation. Encouraged to try to have it done prior to January. - Next mammogram due 04/2017 - Next colonoscopy due 2019 - Will check Hepatitis C

## 2016-08-20 NOTE — Assessment & Plan Note (Signed)
-   Well controlled. Continue Amlodipine, HCTZ, Imdur, and Metoprolol.

## 2016-08-20 NOTE — Assessment & Plan Note (Signed)
Referral to Urology placed

## 2016-08-20 NOTE — Assessment & Plan Note (Addendum)
-   A1C 5.8 - Continue Metformin 1000mg  in morning, 500mg  at night - Will check TSH given increased risk of thyroid problems with diabetes and reports of skin and hair changes, cold intolerance - Follow up in 6months for next A1C. Note that she may not be able to return until September 2018

## 2016-08-23 ENCOUNTER — Other Ambulatory Visit: Payer: Self-pay | Admitting: *Deleted

## 2016-08-23 NOTE — Telephone Encounter (Signed)
Please reply back to white team.  Thanks Burnard HawthorneJazmin Estrada,CMA

## 2016-08-24 MED ORDER — TRAMADOL HCL 50 MG PO TABS: 50.0000 mg | ORAL_TABLET | Freq: Three times a day (TID) | ORAL | 1 refills | Status: DC | PRN

## 2016-08-29 ENCOUNTER — Other Ambulatory Visit: Payer: Self-pay | Admitting: Family Medicine

## 2016-08-29 MED ORDER — TRAMADOL HCL 50 MG PO TABS: 50.0000 mg | ORAL_TABLET | Freq: Three times a day (TID) | ORAL | 1 refills | Status: DC | PRN

## 2016-08-29 NOTE — Progress Notes (Signed)
Tramadol refilled and prescription left at front desk for pickup.

## 2016-09-04 ENCOUNTER — Telehealth: Payer: Self-pay | Admitting: *Deleted

## 2016-09-04 NOTE — Telephone Encounter (Signed)
Contacted pt to inform her that her Rx was up front for pick up per Dr Caroleen Hammanumley note. Lamonte SakaiZimmerman Rumple, Vannary Greening D, [place]

## 2016-09-15 ENCOUNTER — Other Ambulatory Visit: Payer: Self-pay | Admitting: *Deleted

## 2016-09-18 MED ORDER — TRAMADOL HCL 50 MG PO TABS: 50.0000 mg | ORAL_TABLET | Freq: Three times a day (TID) | ORAL | 1 refills | Status: DC | PRN

## 2016-09-26 ENCOUNTER — Other Ambulatory Visit: Payer: Self-pay | Admitting: *Deleted

## 2016-09-27 MED ORDER — HYDROCHLOROTHIAZIDE 25 MG PO TABS: 25.0000 mg | ORAL_TABLET | Freq: Every day | ORAL | 3 refills | Status: DC

## 2016-09-27 MED ORDER — METOPROLOL TARTRATE 25 MG PO TABS: ORAL_TABLET | ORAL | 1 refills | Status: DC

## 2016-09-29 ENCOUNTER — Ambulatory Visit: Payer: BLUE CROSS/BLUE SHIELD | Admitting: Family Medicine

## 2016-10-09 ENCOUNTER — Other Ambulatory Visit: Payer: Self-pay | Admitting: Family Medicine

## 2016-12-26 ENCOUNTER — Other Ambulatory Visit: Payer: Self-pay | Admitting: Family Medicine

## 2017-02-27 ENCOUNTER — Encounter: Payer: Self-pay | Admitting: Family Medicine

## 2017-02-27 ENCOUNTER — Ambulatory Visit (INDEPENDENT_AMBULATORY_CARE_PROVIDER_SITE_OTHER): Payer: BLUE CROSS/BLUE SHIELD | Admitting: Family Medicine

## 2017-02-27 VITALS — BP 130/86 | HR 69 | Temp 98.2°F | Ht 62.0 in | Wt 201.6 lb

## 2017-02-27 DIAGNOSIS — E119 Type 2 diabetes mellitus without complications: Secondary | ICD-10-CM | POA: Diagnosis not present

## 2017-02-27 LAB — POCT GLYCOSYLATED HEMOGLOBIN (HGB A1C): Hemoglobin A1C: 6.1

## 2017-02-27 MED ORDER — METFORMIN HCL 1000 MG PO TABS: 1000.0000 mg | ORAL_TABLET | Freq: Two times a day (BID) | ORAL | 3 refills | Status: DC

## 2017-02-27 MED ORDER — TRAMADOL HCL 50 MG PO TABS: 50.0000 mg | ORAL_TABLET | Freq: Three times a day (TID) | ORAL | 1 refills | Status: DC | PRN

## 2017-02-27 NOTE — Patient Instructions (Signed)
Thank you so much for coming to visit today! Your A1C is 6.1. Please continue to work on your diet and exercise. I have refilled your Metformin and Tramadol. We will check your cholesterol, kidney function, and liver function today. Please return in 6 months for diabetes follow up.  Dr. Caroleen Hammanumley

## 2017-02-27 NOTE — Progress Notes (Signed)
Subjective:     Patient ID: Terri Estrada, female   DOB: 1951-05-19, 66 y.o.   MRN: [id number]  HPI Terri Estrada is a 66yo female presenting today for diabetes follow up. Currently prescribed Metformin 1000mg  twice daily. She is almost out of Metformin, so she reports taking only 500mg  twice daily to make it last longer. Has not been exercising, but reports she plans to start exercising again soon. Requests Cholesterol check today.  Reports she was seen by Urology in 10/2016 and referred to Ob/Gyn. Ob/Gyn placed a ring shaped pessary in 12/2016, which has significantly helped her incontinence. Plans to follow up with Ob/Gyn in 03/2017 for next visit. Also had pap smear at Ob/Gyn with normal cytology and negative high risk HPV.  Former smoker.  Review of Systems Per HPI    Objective:   Physical Exam  Constitutional: She appears well-developed and well-nourished. No distress.  Cardiovascular: Normal rate and regular rhythm.   No murmur heard. Pedal pulses palpable bilaterally  Pulmonary/Chest: Effort normal. No respiratory distress. She has no wheezes.  Neurological:  Sensation intact over feet bilaterally  Skin:  No callous formation on feet  Psychiatric: She has a normal mood and affect. Her behavior is normal.      Assessment and Plan:     1. Controlled type 2 diabetes mellitus without complication, unspecified whether long term insulin use (HCC) A1C 6.1 today, up from 5.8 in 08/2016. Metformin refilled--to increase back to 1000mg  twice daily. Will check cholesterol and BMP today. Handout given on Glycemic Index. Increase exercise. Follow up in 6months.

## 2017-02-28 LAB — CMP14+EGFR
ALK PHOS: 75 IU/L (ref 39–117)
ALT: 23 IU/L (ref 0–32)
AST: 28 IU/L (ref 0–40)
Albumin/Globulin Ratio: 1.8 (ref 1.2–2.2)
Albumin: 4.3 g/dL (ref 3.6–4.8)
BILIRUBIN TOTAL: 0.4 mg/dL (ref 0.0–1.2)
BUN / CREAT RATIO: 16 (ref 12–28)
BUN: 12 mg/dL (ref 8–27)
CHLORIDE: 101 mmol/L (ref 96–106)
CO2: 30 mmol/L — ABNORMAL HIGH (ref 18–29)
Calcium: 9.8 mg/dL (ref 8.7–10.3)
Creatinine, Ser: 0.75 mg/dL (ref 0.57–1.00)
GFR calc Af Amer: 97 mL/min/{1.73_m2} (ref >59)
GFR calc non Af Amer: 85 mL/min/{1.73_m2} (ref >59)
GLOBULIN, TOTAL: 2.4 g/dL (ref 1.5–4.5)
GLUCOSE: 106 mg/dL — AB (ref 65–99)
Potassium: 4.3 mmol/L (ref 3.5–5.2)
SODIUM: 143 mmol/L (ref 134–144)
Total Protein: 6.7 g/dL (ref 6.0–8.5)

## 2017-02-28 LAB — LIPID PANEL
Chol/HDL Ratio: 2.9 ratio (ref 0.0–4.4)
Cholesterol, Total: 143 mg/dL (ref 100–199)
HDL: 50 mg/dL (ref >39)
LDL CALC: 72 mg/dL (ref 0–99)
Triglycerides: 104 mg/dL (ref 0–149)
VLDL CHOLESTEROL CAL: 21 mg/dL (ref 5–40)

## 2017-03-02 ENCOUNTER — Encounter: Payer: Self-pay | Admitting: Family Medicine

## 2017-03-25 ENCOUNTER — Other Ambulatory Visit: Payer: Self-pay | Admitting: Family Medicine

## 2017-03-26 ENCOUNTER — Other Ambulatory Visit: Payer: Self-pay | Admitting: Family Medicine

## 2017-03-29 ENCOUNTER — Encounter: Payer: Self-pay | Admitting: Family Medicine

## 2017-04-16 ENCOUNTER — Other Ambulatory Visit: Payer: Self-pay | Admitting: Family Medicine

## 2017-04-16 DIAGNOSIS — Z1231 Encounter for screening mammogram for malignant neoplasm of breast: Secondary | ICD-10-CM

## 2017-04-24 ENCOUNTER — Other Ambulatory Visit: Payer: Self-pay | Admitting: *Deleted

## 2017-04-24 MED ORDER — AMLODIPINE BESYLATE 10 MG PO TABS: 10.0000 mg | ORAL_TABLET | Freq: Every day | ORAL | 2 refills | Status: DC

## 2017-05-01 ENCOUNTER — Encounter: Payer: Self-pay | Admitting: Student in an Organized Health Care Education/Training Program

## 2017-05-01 ENCOUNTER — Ambulatory Visit (INDEPENDENT_AMBULATORY_CARE_PROVIDER_SITE_OTHER): Payer: BLUE CROSS/BLUE SHIELD | Admitting: Student in an Organized Health Care Education/Training Program

## 2017-05-01 VITALS — BP 122/64 | HR 52 | Temp 98.2°F | Ht 62.0 in | Wt 190.6 lb

## 2017-05-01 DIAGNOSIS — Z111 Encounter for screening for respiratory tuberculosis: Secondary | ICD-10-CM | POA: Diagnosis not present

## 2017-05-01 DIAGNOSIS — Z0000 Encounter for general adult medical examination without abnormal findings: Secondary | ICD-10-CM

## 2017-05-01 NOTE — Patient Instructions (Signed)
It was a pleasure seeing you today in our clinic. Today we discussed your physical forms which were filled out.  Our clinic's number is [phone]. Please call with questions or concerns about what we discussed today.  Be well, Dr. Mosetta PuttFeng

## 2017-05-01 NOTE — Assessment & Plan Note (Addendum)
-   physical forms completed - PPD completed today - follow up with PCP as needed

## 2017-05-01 NOTE — Progress Notes (Signed)
   CC: physical forms for work  HPI: Terri Estrada is a 66 y.o. female with PMH significant for T2DM, aortic stenosis, CAD, HTN, diverticulitis, osteoarthritis [facility] have physical forms completed for work.   Physical forms for work - Patient reports overall feeling well. She denies chest pain, dyspnea, palpitations, difficulties with vision or hearing. She reports staying active and that she has had improvement in osteoarthritic knee pain recently with exercise. - She denies recent fevers or weight loss, no N/V/D/C.   Overdue health maintenance Colonoscopy form provided - however patient states that she has had a colonoscopy done in 2009 and she reports it was normal. Colonoscopy results not available in epic  Review of Symptoms:  See HPI for ROS.   CC, SH/smoking status, and VS noted.  Objective: BP 122/64   Pulse (!) 52   Temp 98.2 F (36.8 C) (Oral)   Ht 5\' 2"  (1.575 m)   Wt 190 lb 9.6 oz (86.5 kg)   SpO2 98%   BMI 34.86 kg/m  GEN: NAD, alert, cooperative, and pleasant. RESPIRATORY: clear to auscultation bilaterally with no wheezes, rhonchi or rales, good effort CV: RRR, no m/r/g, no peripheral edema GI: soft, non-tender, non-distended, no hepatosplenomegaly SKIN: warm and dry, no rashes or lesions NEURO: II-XII grossly intact, normal gait, peripheral sensation intact PSYCH: AAOx3, appropriate affect  Assessment and plan:  Health maintenance examination - physical forms completed - PPD completed today - follow up with PCP as needed   Orders Placed This Encounter  Procedures  . PPD    Order Specific Question:   Has patient ever tested positive?    Answer:   No    Howard PouchLauren Adger Cantera, MD,MS,  PGY2 05/01/2017 2:08 PM

## 2017-05-03 ENCOUNTER — Ambulatory Visit: Payer: BLUE CROSS/BLUE SHIELD | Admitting: *Deleted

## 2017-05-03 DIAGNOSIS — Z111 Encounter for screening for respiratory tuberculosis: Secondary | ICD-10-CM

## 2017-05-03 LAB — TB SKIN TEST
INDURATION: 0 mm
TB SKIN TEST: NEGATIVE

## 2017-05-03 NOTE — Progress Notes (Signed)
PPD Reading Note PPD read and results entered in Epic. Result: 0 mm induration. Interpretation: negative Allergic reaction: no 

## 2017-05-09 ENCOUNTER — Ambulatory Visit: Payer: BLUE CROSS/BLUE SHIELD

## 2017-05-24 ENCOUNTER — Other Ambulatory Visit: Payer: Self-pay | Admitting: *Deleted

## 2017-05-24 ENCOUNTER — Telehealth: Payer: Self-pay

## 2017-05-24 MED ORDER — TRAMADOL HCL 50 MG PO TABS: 50.0000 mg | ORAL_TABLET | Freq: Three times a day (TID) | ORAL | 1 refills | Status: DC | PRN

## 2017-05-24 NOTE — Telephone Encounter (Signed)
-----   Message from Renne Muscaaniel L Warden, MD sent at 05/24/2017 10:36 AM EDT ----- Regarding: prescription pick up Please call patient to let her know to pick up tramadol prescription up front.   Thank you, Rande Bruntaniel L. Myrtie SomanWarden, MD [facility] Resident PGY-2 05/24/2017 10:37 AM

## 2017-05-24 NOTE — Telephone Encounter (Signed)
Pt informed. Tanyla Stege T Ayesha Markwell, CMA  

## 2017-06-12 ENCOUNTER — Ambulatory Visit: Payer: BLUE CROSS/BLUE SHIELD

## 2017-06-19 ENCOUNTER — Other Ambulatory Visit: Payer: Self-pay | Admitting: *Deleted

## 2017-06-26 NOTE — Telephone Encounter (Signed)
2nd request.  Taleigha Pinson L, RN  

## 2017-06-29 MED ORDER — ISOSORBIDE MONONITRATE ER 30 MG PO TB24: ORAL_TABLET | ORAL | 3 refills | Status: DC

## 2017-06-29 MED ORDER — METOPROLOL TARTRATE 25 MG PO TABS: ORAL_TABLET | ORAL | 0 refills | Status: DC

## 2017-06-29 MED ORDER — TRAMADOL HCL 50 MG PO TABS: 50.0000 mg | ORAL_TABLET | Freq: Three times a day (TID) | ORAL | 1 refills | Status: DC | PRN

## 2017-07-06 ENCOUNTER — Other Ambulatory Visit: Payer: Self-pay | Admitting: Family Medicine

## 2017-07-17 ENCOUNTER — Ambulatory Visit
Admission: RE | Admit: 2017-07-17 | Discharge: 2017-07-17 | Disposition: A | Discharge status: Home / Self Care | Payer: Medicare Other | Source: Ambulatory Visit | Attending: Family Medicine | Admitting: Family Medicine

## 2017-07-17 DIAGNOSIS — Z1231 Encounter for screening mammogram for malignant neoplasm of breast: Secondary | ICD-10-CM | POA: Diagnosis not present

## 2017-07-26 ENCOUNTER — Other Ambulatory Visit: Payer: Self-pay | Admitting: Family Medicine

## 2017-07-26 MED ORDER — TRAMADOL HCL 50 MG PO TABS: 50.0000 mg | ORAL_TABLET | Freq: Three times a day (TID) | ORAL | 1 refills | Status: DC | PRN

## 2017-08-01 DIAGNOSIS — N816 Rectocele: Secondary | ICD-10-CM | POA: Diagnosis not present

## 2017-08-01 DIAGNOSIS — N8111 Cystocele, midline: Secondary | ICD-10-CM | POA: Diagnosis not present

## 2017-08-09 ENCOUNTER — Ambulatory Visit (INDEPENDENT_AMBULATORY_CARE_PROVIDER_SITE_OTHER): Payer: Medicare Other | Admitting: Internal Medicine

## 2017-08-09 ENCOUNTER — Encounter: Payer: Self-pay | Admitting: Internal Medicine

## 2017-08-09 VITALS — BP 110/66 | HR 58 | Temp 97.5°F | Ht 62.0 in | Wt 189.8 lb

## 2017-08-09 DIAGNOSIS — Z23 Encounter for immunization: Secondary | ICD-10-CM | POA: Diagnosis not present

## 2017-08-09 DIAGNOSIS — E119 Type 2 diabetes mellitus without complications: Secondary | ICD-10-CM

## 2017-08-09 DIAGNOSIS — E2839 Other primary ovarian failure: Secondary | ICD-10-CM | POA: Diagnosis not present

## 2017-08-09 DIAGNOSIS — Z0000 Encounter for general adult medical examination without abnormal findings: Secondary | ICD-10-CM

## 2017-08-09 LAB — POCT GLYCOSYLATED HEMOGLOBIN (HGB A1C): HEMOGLOBIN A1C: 5.6

## 2017-08-09 NOTE — Progress Notes (Signed)
   Subjective:    Terri Estrada - 66 y.o. female MRN [id number]  Date of birth: 1951/05/03  HPI  Terri Estrada is here for annual exam. No concerns today.   T2DM: Reports compliance with Metformin 1000 mg BID. Does no check CBGs at home. She has been walking quite a bit the past few months. Her sister is her walking partner. Denies vision changes and polyuria.   ROS:  Patient reports no  vision/ hearing changes,anorexia, weight change, fever ,adenopathy, persistant / recurrent hoarseness, swallowing issues, chest pain, edema,persistant / recurrent cough, hemoptysis, dyspnea(rest, exertional, paroxysmal nocturnal), gastrointestinal  bleeding (melena, rectal bleeding), abdominal pain, excessive heart burn, GU symptoms(dysuria, hematuria, pyuria, voiding/incontinence  Issues) syncope, focal weakness, severe memory loss, concerning skin lesions, depression, anxiety, abnormal bruising/bleeding, major joint swelling, breast masses or abnormal vaginal bleeding.     Health Maintenance Due  Topic Date Due  . URINE MICROALBUMIN  06/25/1962  . COLONOSCOPY  06/25/2002  . OPHTHALMOLOGY EXAM  01/27/2015  . FOOT EXAM  01/07/2016  . INFLUENZA VACCINE  05/09/2017  . DEXA SCAN  06/25/2017  . PNA vac Low Risk Adult (1 of 2 - PCV13) 06/25/2017    -  reports that she has quit smoking. She has never used smokeless tobacco. - Review of Systems: Per HPI. - Past Medical History: Patient Active Problem List   Diagnosis Date Noted  . Murmur 12/10/2015  . Pes planus of both feet 01/07/2015  . Depression 01/07/2015  . Female cystocele 06/26/2014  . Vaginal relaxation 04/27/2014  . Health maintenance examination 09/09/2012  . Osteoarthritis 06/21/2010  . Hyperlipidemia 05/31/2009  . DIVERTICULOSIS, COLON 10/16/2008  . AORTIC STENOSIS, MILD 06/02/2008  . Diabetes mellitus type 2, controlled, without complications (HCC) 12/06/2006  . OBESITY, NOS 12/06/2006  . SICKLE CELL TRAIT 12/06/2006  .  HYPERTENSION, BENIGN SYSTEMIC 12/06/2006  . CORONARY, ARTERIOSCLEROSIS 12/06/2006   - Medications: reviewed and updated   Objective:   Physical Exam BP 110/66   Pulse (!) 48   Temp (!) 97.5 F (36.4 C) (Oral)   Ht 5\' 2"  (1.575 m)   Wt 189 lb 12.8 oz (86.1 kg)   SpO2 96%   BMI 34.71 kg/m  Gen: NAD, alert, cooperative with exam, well-appearing HEENT: NCAT, PERRL, clear conjunctiva, oropharynx clear, supple neck CV: RRR, good S1/S2, no murmur, no edema, capillary refill brisk  Resp: CTABL, no wheezes, non-labored Abd: SNTND, BS present, no guarding or organomegaly Skin: no rashes, normal turgor  Neuro: no gross deficits.  Psych: good insight, alert and oriented     Assessment & Plan:   1. Controlled type 2 diabetes mellitus without complication, unspecified whether long term insulin use (HCC) A1c well controlled at 5.6. Encouraged continued adherence to walking regimen. Continue Metformin therapy.  - HgB A1c - Microalbumin/Creatinine Ratio, Urine  2. Estrogen deficiency Given age, will screen for osteoporosis/osteopenia.  - DG Bone Density; Future  3. Need for immunization against influenza - Flu Vaccine QUAD 36+ mos IM  4. Healthcare maintenance Patient reports colonoscopy in 2009 that was normal but is unsure where she had testing done and no record in EMR. Given colonoscopy sheet and encouraged to make an appointment for repeat screening as has been close to 10 years.     Marcy Sirenatherine Wallace, D.O. 08/09/2017, 9:46 AM PGY-3, [facility]

## 2017-08-09 NOTE — Patient Instructions (Signed)
Thank you for coming in today! It was nice to meet you.   Good work on your diabetes! Keep up the walking.   We will call you with your urine results from today.   Take Care,   Dr. Earlene PlaterWallace

## 2017-08-10 LAB — MICROALBUMIN / CREATININE URINE RATIO
CREATININE, UR: 44.7 mg/dL
Microalb/Creat Ratio: 48.8 mg/g creat — ABNORMAL HIGH (ref 0.0–30.0)
Microalbumin, Urine: 21.8 ug/mL

## 2017-08-16 ENCOUNTER — Other Ambulatory Visit: Payer: Self-pay | Admitting: Internal Medicine

## 2017-08-16 ENCOUNTER — Telehealth: Payer: Self-pay | Admitting: Internal Medicine

## 2017-08-16 DIAGNOSIS — Z79899 Other long term (current) drug therapy: Secondary | ICD-10-CM

## 2017-08-16 MED ORDER — LOSARTAN POTASSIUM 25 MG PO TABS: 25.0000 mg | ORAL_TABLET | Freq: Every day | ORAL | 1 refills | Status: DC

## 2017-08-16 NOTE — Telephone Encounter (Signed)
Called to discuss proteinuria from urinalysis at recent visit. Have prescribed Losartan for renal protection in setting of DM. Patient has allergy to ace inhibitors but confirms this was limited to cough without history of angioedema. Will prescribe Losartan 25 mg given BP has been on lower side of normotensive recently. Will have her return in 1-2 weeks for BMET. Will place future orders. Return for BP follow up.  Marcy Sirenatherine Keyah Blizard, D.O. 08/16/2017, 4:37 PM PGY-3, [facility]

## 2017-08-27 ENCOUNTER — Ambulatory Visit (INDEPENDENT_AMBULATORY_CARE_PROVIDER_SITE_OTHER): Payer: Medicare Other | Admitting: Podiatry

## 2017-08-27 DIAGNOSIS — Z794 Long term (current) use of insulin: Secondary | ICD-10-CM

## 2017-08-27 DIAGNOSIS — M779 Enthesopathy, unspecified: Secondary | ICD-10-CM

## 2017-08-27 DIAGNOSIS — M2010 Hallux valgus (acquired), unspecified foot: Secondary | ICD-10-CM | POA: Diagnosis not present

## 2017-08-27 DIAGNOSIS — E119 Type 2 diabetes mellitus without complications: Secondary | ICD-10-CM | POA: Diagnosis not present

## 2017-08-27 NOTE — Progress Notes (Signed)
Subjective: 66 year old female presents the office today for diabetic foot evaluation.  She denies any recent wounds.  Her last A1c was 5.6.  She does have a bunion on the right foot which causes some irritation.  She also has some pain to the top of her foot on the right side.  She states that this started when she wanted to do more walking over the summer.  When she walks she wears a Vionic shoe when she does not have pain but when she wears other shoe system she gets discomfort.  She denies any recent injury or trauma she denies any swelling or redness.  No numbness or tingling.  Denies any claudication symptoms.  She has no other concerns today. Denies any systemic complaints such as fevers, chills, nausea, vomiting. No acute changes since last appointment, and no other complaints at this time.   Objective: AAO x3, NAD  DP/PT pulses palpable bilaterally, CRT less than 3 seconds Significant atrophy is present on the right side worse than the left.  There is no tenderness to the bunion.  First ray hypermobility is present.  On the dorsal aspect of the foot on the second metatarsal cuneiform joint is a small dorsal exostosis palpable.  There is no tenderness to this area.  There is no overlying edema, erythema, increase in warmth.  There is no area of tenderness identified at this time. No open lesions or pre-ulcerative lesions.  No pain with calf compression, swelling, warmth, erythema  Assessment: 66 year old female presents for diabetic foot evaluation with bilateral heel to the right side worse than left, small bone spur right dorsal foot  Plan: -All treatment options discussed with the patient including all alternatives, risks, complications.  -Discussed shoegear modification and inserts for her shoes. She states when she wears the Vionic and more support her feet do not hurt.  -Discussed daily foot inspection.  -She is doing well from a diabetic standpoint. Will see her back in 1 year.   -Patient encouraged to call the office with any questions, concerns, change in symptoms.   Vivi BarrackMatthew R Wagoner DPM

## 2017-08-27 NOTE — Patient Instructions (Signed)
Bunion A bunion is a bump on the base of the big toe that forms when the bones of the big toe joint move out of position. Bunions may be small at first, but they often get larger over time. The can make walking painful. What are the causes? A bunion may be caused by:  Wearing narrow or pointed shoes that force the big toe to press against the other toes.  Abnormal foot development that causes the foot to roll inward (pronate).  Changes in the foot that are caused by certain diseases, such as rheumatoid arthritis and polio.  A foot injury.  What increases the risk? The following factors may make you more likely to develop this condition:  Wearing shoes that squeeze the toes together.  Having certain diseases, such as: ? Rheumatoid arthritis. ? Polio. ? Cerebral palsy.  Having family members who have bunions.  Being born with a foot deformity, such as flat feet or low arches.  Doing activities that put a lot of pressure on the feet, such as ballet dancing.  What are the signs or symptoms? The main symptom of a bunion is a noticeable bump on the big toe. Other symptoms may include:  Pain.  Swelling around the big toe.  Redness and inflammation.  Thick or hardened skin on the big toe or between the toes.  Stiffness or loss of motion in the big toe.  Trouble with walking.  How is this diagnosed? A bunion may be diagnosed based on your symptoms, medical history, and activities. You may have tests, such as:  X-rays. These allow your health care provider to check the position of the bones in your foot and look for damage to your joint. They also help your health care provider to determine the severity of your bunion and the best way to treat it.  Joint aspiration. In this test, a sample of fluid is removed from the toe joint. This test, which may be done if you are in a lot of pain, helps to rule out diseases that cause painful swelling of the joints, such as  arthritis.  How is this treated? There is no cure for a bunion, but treatment can help to prevent a bunion from getting worse. Treatment depends on the severity of your symptoms. Your health care provider may recommend:  Wearing shoes that have a wide toe box.  Using bunion pads to cushion the affected area.  Taping your toes together to keep them in a normal position.  Placing a device inside your shoe (orthotics) to help reduce pressure on your toe joint.  Taking medicine to ease pain, inflammation, and swelling.  Applying heat or ice to the affected area.  Doing stretching exercises.  Surgery to remove scar tissue and move the toes back into their normal position. This treatment is rare.  Follow these instructions at home:  Support your toe joint with proper footwear, shoe padding, or taping as told by your health care provider.  Take over-the-counter and prescription medicines only as told by your health care provider.  If directed, apply ice to the injured area: ? Put ice in a plastic bag. ? Place a towel between your skin and the bag. ? Leave the ice on for 20 minutes, 2-3 times per day.  If directed, apply heat to the affected area before you exercise. Use the heat source that your health care provider recommends, such as a moist heat pack or a heating pad. ? Place a towel between your   skin and the heat source. ? Leave the heat on for 20-30 minutes. ? Remove the heat if your skin turns bright red. This is especially important if you are unable to feel pain, heat, or cold. You may have a greater risk of getting burned.  Do exercises as told by your health care provider.  Keep all follow-up visits as told by your health care provider. Contact a health care provider if:  Your symptoms get worse.  Your symptoms do not improve in 2 weeks. Get help right away if:  You have severe pain and trouble with walking. This information is not intended to replace advice given  to you by your health care provider. Make sure you discuss any questions you have with your health care provider. Document Released: 09/25/2005 Document Revised: 03/02/2016 Document Reviewed: 04/25/2015 Elsevier Interactive Patient Education  2018 Elsevier Inc.   Diabetes and Foot Care Diabetes may cause you to have problems because of poor blood supply (circulation) to your feet and legs. This may cause the skin on your feet to become thinner, break easier, and heal more slowly. Your skin may become dry, and the skin may peel and crack. You may also have nerve damage in your legs and feet causing decreased feeling in them. You may not notice minor injuries to your feet that could lead to infections or more serious problems. Taking care of your feet is one of the most important things you can do for yourself. Follow these instructions at home:  Wear shoes at all times, even in the house. Do not go barefoot. Bare feet are easily injured.  Check your feet daily for blisters, cuts, and redness. If you cannot see the bottom of your feet, use a mirror or ask someone for help.  Wash your feet with warm water (do not use hot water) and mild soap. Then pat your feet and the areas between your toes until they are completely dry. Do not soak your feet as this can dry your skin.  Apply a moisturizing lotion or petroleum jelly (that does not contain alcohol and is unscented) to the skin on your feet and to dry, brittle toenails. Do not apply lotion between your toes.  Trim your toenails straight across. Do not dig under them or around the cuticle. File the edges of your nails with an emery board or nail file.  Do not cut corns or calluses or try to remove them with medicine.  Wear clean socks or stockings every day. Make sure they are not too tight. Do not wear knee-high stockings since they may decrease blood flow to your legs.  Wear shoes that fit properly and have enough cushioning. To break in new  shoes, wear them for just a few hours a day. This prevents you from injuring your feet. Always look in your shoes before you put them on to be sure there are no objects inside.  Do not cross your legs. This may decrease the blood flow to your feet.  If you find a minor scrape, cut, or break in the skin on your feet, keep it and the skin around it clean and dry. These areas may be cleansed with mild soap and water. Do not cleanse the area with peroxide, alcohol, or iodine.  When you remove an adhesive bandage, be sure not to damage the skin around it.  If you have a wound, look at it several times a day to make sure it is healing.  Do not use   heating pads or hot water bottles. They may burn your skin. If you have lost feeling in your feet or legs, you may not know it is happening until it is too late.  Make sure your health care provider performs a complete foot exam at least annually or more often if you have foot problems. Report any cuts, sores, or bruises to your health care provider immediately. Contact a health care provider if:  You have an injury that is not healing.  You have cuts or breaks in the skin.  You have an ingrown nail.  You notice redness on your legs or feet.  You feel burning or tingling in your legs or feet.  You have pain or cramps in your legs and feet.  Your legs or feet are numb.  Your feet always feel cold. Get help right away if:  There is increasing redness, swelling, or pain in or around a wound.  There is a red line that goes up your leg.  Pus is coming from a wound.  You develop a fever or as directed by your health care provider.  You notice a bad smell coming from an ulcer or wound. This information is not intended to replace advice given to you by your health care provider. Make sure you discuss any questions you have with your health care provider. Document Released: 09/22/2000 Document Revised: 03/02/2016 Document Reviewed:  03/04/2013 Elsevier Interactive Patient Education  2017 Elsevier Inc.   

## 2017-09-03 ENCOUNTER — Ambulatory Visit (INDEPENDENT_AMBULATORY_CARE_PROVIDER_SITE_OTHER): Payer: Medicare Other | Admitting: *Deleted

## 2017-09-03 ENCOUNTER — Ambulatory Visit
Admission: RE | Admit: 2017-09-03 | Discharge: 2017-09-03 | Disposition: A | Discharge status: Home / Self Care | Payer: Medicare Other | Source: Ambulatory Visit | Attending: Family Medicine | Admitting: Family Medicine

## 2017-09-03 DIAGNOSIS — Z23 Encounter for immunization: Secondary | ICD-10-CM | POA: Diagnosis not present

## 2017-09-03 DIAGNOSIS — E2839 Other primary ovarian failure: Secondary | ICD-10-CM

## 2017-09-03 DIAGNOSIS — M85832 Other specified disorders of bone density and structure, left forearm: Secondary | ICD-10-CM | POA: Diagnosis not present

## 2017-09-03 DIAGNOSIS — Z780 Asymptomatic menopausal state: Secondary | ICD-10-CM | POA: Diagnosis not present

## 2017-09-03 NOTE — Progress Notes (Signed)
   Patient presents for prevnar vaccination. Tolerated well. Kinnie FeilL. Ducatte, RN, BSN

## 2017-09-05 ENCOUNTER — Telehealth: Payer: Self-pay | Admitting: Internal Medicine

## 2017-09-05 NOTE — Telephone Encounter (Signed)
Spoke with patient over the phone and let her know that her bone density scan showed osteopenia. Recommended taking Calcium-Vitamin D every day (which patient is already doing) and exercising regularly (including strength training). Patient voiced understanding.

## 2017-09-13 ENCOUNTER — Encounter: Payer: Self-pay | Admitting: Internal Medicine

## 2017-09-13 ENCOUNTER — Ambulatory Visit (INDEPENDENT_AMBULATORY_CARE_PROVIDER_SITE_OTHER): Payer: Medicare Other | Admitting: Internal Medicine

## 2017-09-13 ENCOUNTER — Other Ambulatory Visit: Payer: Self-pay

## 2017-09-13 VITALS — BP 120/70 | HR 58 | Temp 97.7°F | Ht 62.0 in | Wt 189.0 lb

## 2017-09-13 DIAGNOSIS — E1129 Type 2 diabetes mellitus with other diabetic kidney complication: Secondary | ICD-10-CM | POA: Diagnosis not present

## 2017-09-13 DIAGNOSIS — I10 Essential (primary) hypertension: Secondary | ICD-10-CM | POA: Diagnosis not present

## 2017-09-13 DIAGNOSIS — R809 Proteinuria, unspecified: Secondary | ICD-10-CM

## 2017-09-13 DIAGNOSIS — M1711 Unilateral primary osteoarthritis, right knee: Secondary | ICD-10-CM

## 2017-09-13 NOTE — Progress Notes (Signed)
Subjective:    Terri Estrada - 66 y.o. female MRN [id number]  Date of birth: [birth year]  HPI  Terri BigDenise S Maj is here for follow up.  T2DM: Patient compliant with Metformin. Does not monitor CBGs at home. Microalbuminuria noted at last office visit 11/1. Patient was started on Losartan 25 mg. She reports no problems with tolerating the medication. She has been walking quite a bit the past few months with her sister. In preparation for the upcoming winter storm, she is walking four times this week because she anticipates not being able to walk much over the weekend. Denies lightheadedness and dizziness. Denies vision changes and polyuria.   Knee Pain: Patient with known OA of knees bilaterally. Reports that recently her right knee has been bothering her more with the walking. She attributes the increase in pain to the recent cold weather. She takes Tylenol and soaks in a hot bath to help relieve pain. If pain is particularly bad, she will take one Tramadol. Reports she uses this very sparingly because she does not want to get hooked on the medication. She denies edema, erythema, knee giving way, or locking/popping of knee. She has tried steroid injections in the past and only experienced relief for very short period of time; therefore, she is not interested in more steroid injections at present.   Health Maintenance Due  Topic Date Due  . COLONOSCOPY  06/25/2002  . OPHTHALMOLOGY EXAM  01/27/2015    -  reports that she has quit smoking. she has never used smokeless tobacco. - Review of Systems: Per HPI. - Past Medical History: Patient Active Problem List   Diagnosis Date Noted  . Murmur 12/10/2015  . Pes planus of both feet 01/07/2015  . Depression 01/07/2015  . Female cystocele 06/26/2014  . Vaginal relaxation 04/27/2014  . Health maintenance examination 09/09/2012  . Osteoarthritis 06/21/2010  . Hyperlipidemia 05/31/2009  . DIVERTICULOSIS, COLON 10/16/2008  . AORTIC STENOSIS, MILD  06/02/2008  . Diabetes mellitus type 2, controlled, without complications (HCC) 12/06/2006  . OBESITY, NOS 12/06/2006  . SICKLE CELL TRAIT 12/06/2006  . HYPERTENSION, BENIGN SYSTEMIC 12/06/2006  . CORONARY, ARTERIOSCLEROSIS 12/06/2006   - Medications: reviewed and updated   Objective:   Physical Exam BP 120/70 (BP Location: Left Arm, Patient Position: Sitting, Cuff Size: Normal)   Pulse (!) 58   Temp 97.7 F (36.5 C) (Oral)   Ht 5\' 2"  (1.575 m)   Wt 189 lb (85.7 kg)   SpO2 99%   BMI 34.57 kg/m  Gen: NAD, alert, cooperative with exam, well-appearing CV: RRR, good S1/S2, no murmur, no edema, capillary refill brisk  Resp: CTABL, no wheezes, non-labored MSK: Right knee without erythema, increased warmth or effusion. Good active and passive ROM. Crepitus appreciated with flexion and extension. No TTP over patella or joint lines. Negative anterior drawer test. No pain with valgus or varus stress testing. Solid endpoints of ligaments.     Assessment & Plan:   1. Type 2 diabetes mellitus with microalbuminuria, without long-term current use of insulin (HCC) Very well controlled T2DM with last A1c of 5.6%. Patient reports compliance with Losartan. BP is at goal. Will check BMET due to initiation of new medication. Follow up in 5 months for diabetes.  - Basic Metabolic Panel  2. Osteoarthritis of right knee, unspecified osteoarthritis type Exam and history consistent with OA of knee. No evidence of meniscal or ligamentous deformities on history or exam. Continue conservative management.    Marcy Sirenatherine Floria Brandau, D.O. 09/13/2017,  9:20 AM PGY-3, [facility]

## 2017-09-13 NOTE — Patient Instructions (Addendum)
So good to see you today!   We will get your blood work today to check that electrolytes and kidneys look ok with the addition of Losartan.   You are doing all the right things for your knee arthritis! Keep up the good work with the walking.   Please consider getting the colonoscopy. It is important for screening for colon cancer.   Have a Terri CabalMerry Christmas!   Dr. Earlene PlaterWallace

## 2017-09-14 ENCOUNTER — Telehealth: Payer: Self-pay

## 2017-09-14 LAB — BASIC METABOLIC PANEL
BUN / CREAT RATIO: 19 (ref 12–28)
BUN: 15 mg/dL (ref 8–27)
CO2: 27 mmol/L (ref 20–29)
CREATININE: 0.78 mg/dL (ref 0.57–1.00)
Calcium: 9.9 mg/dL (ref 8.7–10.3)
Chloride: 101 mmol/L (ref 96–106)
GFR, EST AFRICAN AMERICAN: 92 mL/min/{1.73_m2} (ref >59)
GFR, EST NON AFRICAN AMERICAN: 80 mL/min/{1.73_m2} (ref >59)
Glucose: 85 mg/dL (ref 65–99)
Potassium: 4 mmol/L (ref 3.5–5.2)
SODIUM: 143 mmol/L (ref 134–144)

## 2017-09-14 NOTE — Telephone Encounter (Signed)
Tried to contact pt. No VM. Mailing copy of lab results to pt. If pt calls, please give her the info below. Sunday SpillersSharon T Saunders, CMA

## 2017-09-14 NOTE — Telephone Encounter (Signed)
-----   Message from Arvilla Marketatherine Lauren Wallace, DO sent at 09/14/2017 12:08 PM EST ----- Please call patient to let her know that he labs were normal.   Marcy Sirenatherine Wallace, D.O. 09/14/2017, 12:08 PM PGY-3, [facility]

## 2017-09-22 ENCOUNTER — Other Ambulatory Visit: Payer: Self-pay | Admitting: Family Medicine

## 2017-10-03 ENCOUNTER — Other Ambulatory Visit: Payer: Self-pay | Admitting: Internal Medicine

## 2017-10-03 MED ORDER — HYDROCHLOROTHIAZIDE 25 MG PO TABS: 25.0000 mg | ORAL_TABLET | Freq: Every day | ORAL | 3 refills | Status: DC

## 2017-10-04 ENCOUNTER — Other Ambulatory Visit: Payer: Self-pay | Admitting: Internal Medicine

## 2017-10-04 MED ORDER — ATORVASTATIN CALCIUM 80 MG PO TABS: 80.0000 mg | ORAL_TABLET | Freq: Every day | ORAL | 2 refills | Status: DC

## 2017-10-12 ENCOUNTER — Other Ambulatory Visit: Payer: Self-pay | Admitting: Internal Medicine

## 2017-10-22 ENCOUNTER — Other Ambulatory Visit: Payer: Self-pay | Admitting: Internal Medicine

## 2017-11-05 DIAGNOSIS — N816 Rectocele: Secondary | ICD-10-CM | POA: Diagnosis not present

## 2017-11-05 DIAGNOSIS — N8111 Cystocele, midline: Secondary | ICD-10-CM | POA: Diagnosis not present

## 2017-11-22 ENCOUNTER — Other Ambulatory Visit: Payer: Self-pay | Admitting: Family Medicine

## 2017-12-20 ENCOUNTER — Telehealth: Payer: Self-pay | Admitting: Internal Medicine

## 2017-12-20 NOTE — Telephone Encounter (Signed)
Prescription for Tramadol dated 07/26/17 was shredded on 12/20/17 because still had not picked up.

## 2018-01-04 ENCOUNTER — Ambulatory Visit: Payer: Medicare Other | Admitting: Internal Medicine

## 2018-01-18 DIAGNOSIS — N8111 Cystocele, midline: Secondary | ICD-10-CM | POA: Diagnosis not present

## 2018-01-18 DIAGNOSIS — N816 Rectocele: Secondary | ICD-10-CM | POA: Diagnosis not present

## 2018-01-21 ENCOUNTER — Encounter: Payer: Self-pay | Admitting: Internal Medicine

## 2018-01-21 ENCOUNTER — Encounter: Payer: Self-pay | Admitting: Psychology

## 2018-01-21 ENCOUNTER — Other Ambulatory Visit: Payer: Self-pay

## 2018-01-21 ENCOUNTER — Ambulatory Visit (INDEPENDENT_AMBULATORY_CARE_PROVIDER_SITE_OTHER): Payer: Medicare Other | Admitting: Internal Medicine

## 2018-01-21 VITALS — BP 138/76 | HR 71 | Temp 98.0°F | Ht 62.0 in | Wt 182.4 lb

## 2018-01-21 DIAGNOSIS — E119 Type 2 diabetes mellitus without complications: Secondary | ICD-10-CM | POA: Diagnosis not present

## 2018-01-21 DIAGNOSIS — F4321 Adjustment disorder with depressed mood: Secondary | ICD-10-CM

## 2018-01-21 DIAGNOSIS — Z7189 Other specified counseling: Secondary | ICD-10-CM | POA: Insufficient documentation

## 2018-01-21 DIAGNOSIS — M170 Bilateral primary osteoarthritis of knee: Secondary | ICD-10-CM | POA: Diagnosis not present

## 2018-01-21 LAB — POCT GLYCOSYLATED HEMOGLOBIN (HGB A1C): HEMOGLOBIN A1C: 5.7

## 2018-01-21 NOTE — Progress Notes (Signed)
Dr. Juleen China requested a [place].   Presenting Issue:  Symptoms of depression due to grief.  Report of symptoms:  The patient is very distressed by the recent death of her daughter.  Duration of CURRENT symptoms:  Since December of 2018  Age of onset of first mood disturbance:  Grief related to loss of adult daughter.  Impact on function:  Tearful, anhedonia  Psychiatric History - Diagnoses: none per patient, but her chart does reveal that symptoms of depression were address in 2016 with the suggestion that she receive outpatient therapy. - Hospitalizations: none - Pharmacotherapy: not asked - Outpatient therapy: hospice-provided therapy for grief  Family history of psychiatric issues:  Not asked. Current and history of substance use:  Not asked.  Medical conditions that might explain or contribute to symptoms:  Not asked  PHQ-9:  To be obtained during first office visit. GAD-7:  To be obtained during first office visit. MDQ (if indicated):  To be obtained if needed.  Assessment / Plan / Recommendations:Because I was in between patients, I met very briefly (five minutes) with Terri Estrada to discuss her interest in setting up an appointment with [facility]. She would like to do so, but is not available on Mondays because she visits [facility] that day and would like to receive additional counseling here to augment what she is receiving through hospice. I explained the role of integrated care and offered her an appointment with Amedeo Kinsman, which she accepted. Accordingly, she will see Verdis Frederickson on Friday, April 26 at 8:30 am  [facility] completed.

## 2018-01-21 NOTE — Patient Instructions (Signed)
Thank you for coming in today! It is always good to see you!   Today we talked about your diabetes. Your A1c looks great with a result of 5.7. We do not need to adjust any medications.   I am sorry to hear about the passing of your daughter. It is very normal to feel sad about the loss of a loved one and to have harder days than others. I am glad [facility] are [facility] but I think we can get you some extra help. I need to know if you have any thoughts of harming yourself, are unable to participate in activities you enjoy, have trouble getting yourself out of the house in the morning, have other concerns about your mood or emotions.   Take Care,  Dr. Earlene PlaterWallace

## 2018-01-21 NOTE — Progress Notes (Signed)
Subjective:    Sterling BigDenise S Frith - 67 y.o. female MRN [id number]  Date of birth: [birth year]  HPI  Sterling BigDenise S Hovanec is here for follow up.  T2DM: Patient compliant with Metformin and Losartan for microalbuminuria. Does not monitor CBGs at home.  She reports no problems with tolerating the medication. Continues to walk most days with her sister. Denies lightheadedness and dizziness. Denies vision changes and polyuria.   Knee Pain: Patient with known OA of knees bilaterally. Reports that recently her right knee has been bothering her more with the walking. She attributes the increase in pain to the recent cold weather. She takes Tylenol and soaks in a hot bath to help relieve pain. Also uses a cold pack on th knees. She denies edema, erythema, knee giving way, or locking/popping of knee. She has tried steroid injections in the past and only experienced relief for very short period of time; therefore, she is not interested in more steroid injections at present. She does not wish to have any stronger medications at present for knee pain. Knee pain does not hinder her daily activities significantly.   Grief Reaction: Reports her daughter passed away in December. Concerned that she is not coping appropriately as she still has periods of sadness lasting 1-2 days every few weeks. Otherwise, most days she feels she is coping well. [facility] 1-2/month and [name] is able to see someone at the same time for therapy. Wonders if she may benefit from more frequent therapy sessions as she feels that talking helps. Is still able to find enjoyment in activities and day to day tasks have not been impaired. Denies SI.     -  reports that she has quit smoking. She has never used smokeless tobacco. - Review of Systems: Per HPI. - Past Medical History: Patient Active Problem List   Diagnosis Date Noted  . Murmur 12/10/2015  . Pes planus of both feet 01/07/2015  .  Depression 01/07/2015  . Female cystocele 06/26/2014  . Vaginal relaxation 04/27/2014  . Health maintenance examination 09/09/2012  . Osteoarthritis 06/21/2010  . Hyperlipidemia 05/31/2009  . DIVERTICULOSIS, COLON 10/16/2008  . AORTIC STENOSIS, MILD 06/02/2008  . Diabetes mellitus type 2, controlled, without complications (HCC) 12/06/2006  . OBESITY, NOS 12/06/2006  . SICKLE CELL TRAIT 12/06/2006  . HYPERTENSION, BENIGN SYSTEMIC 12/06/2006  . CORONARY, ARTERIOSCLEROSIS 12/06/2006   - Medications: reviewed and updated   Objective:   Physical Exam BP 138/76   Pulse 71   Temp 98 F (36.7 C) (Oral)   Ht 5\' 2"  (1.575 m)   Wt 182 lb 6.4 oz (82.7 kg)   SpO2 99%   BMI 33.36 kg/m  Gen: NAD, alert, cooperative with exam, well-appearing CV: RRR, good S1/S2, no murmur, no edema, capillary refill brisk  Resp: CTABL, no wheezes, non-labored Psych: good insight, alert and oriented    Assessment & Plan:   1. Controlled type 2 diabetes mellitus without complication, without long-term current use of insulin (HCC) Well controlled today with A1c result of 5.7%. Continue Metformin and Losartan. BP is well controlled. Follow up in 6 months for T2DM.  - HgB A1c  2. Grief reaction Discussed with patient that grief is a waxing and waning course and not linear. Provided reassurance that the loss was recent and it is normal to feel periods of sadness over a loff os this significance. No suicidal or safety concerns at present. Given known trigger and lack  of significant impair on daily life, do not feel that pharmacotherapy is warranted at present. Patient agrees. Seen by Carollee Herter with [facility] and follow up established. Strict return precautions discussed with patient.   3. Osteoarthritis of both knees, unspecified osteoarthritis type Currently well controlled with conservative measures. No changes in management. Continue to follow.   Marcy Siren, D.O. 01/21/2018, 1:01 PM PGY-3, [facility]

## 2018-01-21 NOTE — Assessment & Plan Note (Signed)
Assessment / Plan / Recommendations:Because I was in between patients, I met very briefly (five minutes) with Terri Estrada to discuss her interest in setting up an appointment with [facility]. She would like to do so, but is not available on Mondays because she visits [facility] that day and would like to receive additional counseling here to augment what she is receiving through hospice. I explained the role of integrated care and offered her an appointment with Amedeo Kinsman, which she accepted. Accordingly, she will see Verdis Frederickson on Friday, April 26 at 8:30 am

## 2018-01-22 MED ORDER — AMLODIPINE BESYLATE 10 MG PO TABS: 10.0000 mg | ORAL_TABLET | Freq: Every day | ORAL | 2 refills | Status: DC

## 2018-01-29 ENCOUNTER — Other Ambulatory Visit: Payer: Self-pay

## 2018-01-30 MED ORDER — TRAMADOL HCL 50 MG PO TABS: 50.0000 mg | ORAL_TABLET | Freq: Three times a day (TID) | ORAL | 0 refills | Status: DC | PRN

## 2018-02-01 ENCOUNTER — Ambulatory Visit: Payer: Medicare Other | Admitting: Psychology

## 2018-02-01 DIAGNOSIS — Z7189 Other specified counseling: Secondary | ICD-10-CM

## 2018-02-01 NOTE — Assessment & Plan Note (Signed)
Assessment/Plan Recommendations:  Terri Estrada is experiencing grief due to the loss of her daughter last December. Her PHQ9 score (1, denied item 9, SI) indicates minimal depressive symptoms; however, she appears to be avoiding the full experiencing of her grief by keeping herself occupied, which may be hampering her progress. During the appointment she exhibited blunted affect and was frequently tearful.  [facility] discussed the stages of grief, normalized her experience of grief, and provided Terri Estrada with information on the importance of allowing herself to grieve. Given that she finds it difficultly to engage with these emotions when they appear, [facility] suggested setting a time limit, such as 3 to 5 minutes, in which she allows herself to feel the full range and intensity of her emotions. Terri Estrada expressed understanding and willingness to try this technique.  Terri Estrada expressed interest in support groups of those who have lost children and [facility] provided contact information. She also expressed interest in [facility] and will schedule appointment for self in 2 weeks.

## 2018-02-01 NOTE — Progress Notes (Signed)
Reason for follow-up:  [facility].   Issues discussed:  Terri Estrada provided some information on the loss of her daughter in December, 2018 and how she has been coping. She has good family support in her children and sisters, and is in frequent contact with them. Additionally, [facility] [place].   Terri Estrada reported she struggles to feel her grieving emotions and instead busyies herself. [facility] discussed the importance of engaging with these feelings as a way to process these and move forward in the grieving process.  [facility] also discussed options for support groups for grief and Terri Estrada expressed interest in these.

## 2018-02-18 ENCOUNTER — Ambulatory Visit: Payer: Medicare Other | Admitting: Psychology

## 2018-02-18 DIAGNOSIS — Z7189 Other specified counseling: Secondary | ICD-10-CM

## 2018-02-18 NOTE — Assessment & Plan Note (Signed)
Assessment/Plan/Recommendation: Terri Estrada is experiencing minimal symptoms of depression (PHQ9 = 2, no SI, not difficult at all) and reports no anxiety (GAD7 =0). She is feeling grief regarding her daughter's death, but is avoiding it. We discussed setting aside some time for grief every other evening. This is consistent with what she discussed at [facility] and with [facility]. I also provided some psychoeducation regarding the stages of grief.

## 2018-02-18 NOTE — Patient Instructions (Signed)
Yulonda: It was so good to see you today. I am glad that you are staying busy, but set aside time to grieve. Try setting aside some time every other night. Keep doing things that make you feel good, such as reading mystery novels and the Bible. Keep socializing with your family. I am glad that you are so active and efficient but do set aside some time to grieve. Return to see me as needed. You can call the front desk to schedule.  -Delfin Edis [facility]

## 2018-02-18 NOTE — Progress Notes (Signed)
Reason for follow-up: Terri Estrada returns to discuss her grief concerning her daughter's death.  Issues discussed:  We discussed Terri Estrada's therapy at Hosp[facility]. She is seen there about once per month and would like to augment that with what she receives here. She is still feeling sad and experiencing waves of grief. She enjoys reading mystery novels and the Bible, as well as talking with her family and going to church. She stays busy around the house, but may be using these activities to avoid feeling grief.

## 2018-02-20 ENCOUNTER — Other Ambulatory Visit: Payer: Self-pay

## 2018-02-22 MED ORDER — METFORMIN HCL 1000 MG PO TABS: 1000.0000 mg | ORAL_TABLET | Freq: Two times a day (BID) | ORAL | 3 refills | Status: DC

## 2018-02-22 MED ORDER — METOPROLOL TARTRATE 25 MG PO TABS: 25.0000 mg | ORAL_TABLET | Freq: Two times a day (BID) | ORAL | 5 refills | Status: DC

## 2018-03-28 ENCOUNTER — Other Ambulatory Visit: Payer: Self-pay | Admitting: *Deleted

## 2018-03-28 MED ORDER — TRAMADOL HCL 50 MG PO TABS: 50.0000 mg | ORAL_TABLET | Freq: Three times a day (TID) | ORAL | 0 refills | Status: DC | PRN

## 2018-04-23 DIAGNOSIS — N8111 Cystocele, midline: Secondary | ICD-10-CM | POA: Diagnosis not present

## 2018-04-23 DIAGNOSIS — N816 Rectocele: Secondary | ICD-10-CM | POA: Diagnosis not present

## 2018-04-30 ENCOUNTER — Ambulatory Visit (INDEPENDENT_AMBULATORY_CARE_PROVIDER_SITE_OTHER): Payer: Medicare Other | Admitting: Student in an Organized Health Care Education/Training Program

## 2018-04-30 ENCOUNTER — Encounter: Payer: Self-pay | Admitting: Student in an Organized Health Care Education/Training Program

## 2018-04-30 ENCOUNTER — Telehealth: Payer: Self-pay

## 2018-04-30 ENCOUNTER — Other Ambulatory Visit: Payer: Self-pay

## 2018-04-30 VITALS — BP 118/62 | HR 52 | Temp 98.2°F | Ht 62.0 in | Wt 183.0 lb

## 2018-04-30 DIAGNOSIS — I10 Essential (primary) hypertension: Secondary | ICD-10-CM | POA: Diagnosis not present

## 2018-04-30 DIAGNOSIS — E119 Type 2 diabetes mellitus without complications: Secondary | ICD-10-CM | POA: Diagnosis not present

## 2018-04-30 DIAGNOSIS — E669 Obesity, unspecified: Secondary | ICD-10-CM

## 2018-04-30 DIAGNOSIS — Z0000 Encounter for general adult medical examination without abnormal findings: Secondary | ICD-10-CM

## 2018-04-30 DIAGNOSIS — E785 Hyperlipidemia, unspecified: Secondary | ICD-10-CM | POA: Diagnosis not present

## 2018-04-30 LAB — POCT GLYCOSYLATED HEMOGLOBIN (HGB A1C): HBA1C, POC (CONTROLLED DIABETIC RANGE): 5.6 % (ref 0.0–7.0)

## 2018-04-30 MED ORDER — METFORMIN HCL 500 MG PO TABS: 500.0000 mg | ORAL_TABLET | Freq: Every day | ORAL | 0 refills | Status: DC

## 2018-04-30 NOTE — Patient Instructions (Signed)
It was a pleasure seeing you today in our clinic.  We drew blood work at today's visit. I will call or send you a letter with these results. If you do not hear from me within the next week, please give our office a call.  Our clinic's number is [phone]. Please call with questions or concerns about what we discussed today.  Be well, Dr. Teigen Parslow   

## 2018-04-30 NOTE — Telephone Encounter (Signed)
Pt LVM on nurse line stating [facility] never received the shingles vaccine prescription. Please advise.

## 2018-04-30 NOTE — Progress Notes (Signed)
Terri Pouch, MD, MS PGY3 Phone: [phone]  Subjective:  CC -- Annual Physical; With no complaints.  Pt reports she is doing well. She is walking 3 times per week. She would like labs drawn to check her cholesterol.  Health Maintenance: Colonoscopy (information provided), ophthalmology exam  Cardiovascular: - Risk as of 04/30/2018: ASCVD risk score <5% with labs drawn while patient is on statin - Dx Hypertension: yes, well controlled - Dx Hyperlipidemia: yes, well controlled - Dx Obesity: Class I obesity, BMI 33 - Physical Activity: Walks 3 times per week - Diabetes: Last A1c 3 months ago was 5.7 >> 5.6 at today's visit. Patient would like to decrease the dose of her metformin as she is attempting lifestyle changes.  Cancer: Colorectal >> Colonoscopy: information provided Lung >> Tobacco Use: no, quit 2001 Breast >> Mammogram: not due Skin >> Suspicious lesions: no   Social: Alcohol Use: yes, 2 beers once per month  Tobacco Use: no  Other Drugs: no  Risky Sexual Behavior: no  Depression: no Support and Life at Home: yes   Other: Osteoporosis:Osteopenia on 08/2017 Bone density scan (T core -1.1) - taking vitamin D/Calcium supplement Zoster Vaccine: Script sent to pharmacy today for patient to get this vaccine Flu Vaccine: Patient to return during flu season Pneumonia Vaccine: PPSV 23 due on 08/2018   ROS-  Past Medical History Patient Active Problem List   Diagnosis Date Noted  . Murmur 12/10/2015  . Pes planus of both feet 01/07/2015  . Depression 01/07/2015  . Female cystocele 06/26/2014  . Healthcare maintenance 12/17/2012  . Osteoarthritis 06/21/2010  . Hyperlipidemia 05/31/2009  . DIVERTICULOSIS, COLON 10/16/2008  . AORTIC STENOSIS, MILD 06/02/2008  . Diabetes mellitus type 2, controlled, without complications (HCC) 12/06/2006  . Obesity (BMI 30-39.9) 12/06/2006  . SICKLE CELL TRAIT 12/06/2006  . HYPERTENSION, BENIGN SYSTEMIC 12/06/2006  . CORONARY,  ARTERIOSCLEROSIS 12/06/2006    Medications- reviewed and updated Current Outpatient Medications  Medication Sig Dispense Refill  . amLODipine (NORVASC) 10 MG tablet Take 1 tablet (10 mg total) by mouth daily. 90 tablet 2  . aspirin EC 81 MG tablet Take 1 tablet (81 mg total) by mouth daily. 90 tablet 3  . atorvastatin (LIPITOR) 80 MG tablet Take 1 tablet (80 mg total) by mouth daily. 90 tablet 2  . Calcium Carbonate (CALCIUM 600) 1500 MG TABS Take 600 mg by mouth 2 (two) times daily.     . hydrochlorothiazide (HYDRODIURIL) 25 MG tablet Take 1 tablet (25 mg total) by mouth daily. 90 tablet 3  . isosorbide mononitrate (IMDUR) 30 MG 24 hr tablet TAKE 1 TABLET (30 MG TOTAL) BY MOUTH DAILY. 90 tablet 3  . losartan (COZAAR) 25 MG tablet TAKE ONE TABLET BY MOUTH DAILY 90 tablet 3  . metFORMIN (GLUCOPHAGE) 500 MG tablet Take 1 tablet (500 mg total) by mouth daily. 90 tablet 0  . metoprolol tartrate (LOPRESSOR) 25 MG tablet Take 1 tablet (25 mg total) by mouth 2 (two) times daily. 60 tablet 5  . Multiple Vitamins-Minerals (MULTIVITAMINS THER. W/MINERALS) TABS Take 1 tablet by mouth daily.      . Omega-3 Fatty Acids (FISH OIL) 1200 MG CAPS Take 1 capsule by mouth 2 (two) times daily.      . traMADol (ULTRAM) 50 MG tablet Take 1 tablet (50 mg total) by mouth every 8 (eight) hours as needed. 30 tablet 0   No current facility-administered medications for this visit.     Objective: BP 118/62   Pulse Marland Kitchen)  52   Temp 98.2 F (36.8 C) (Oral)   Ht 5\' 2"  (1.575 m)   Wt 183 lb (83 kg)   SpO2 99%   BMI 33.47 kg/m  Gen: NAD, alert, cooperative with exam HEENT: NCAT, EOMI, PERRL CV: RRR, good S1/S2, no murmur Resp: CTABL, no wheezes, non-labored Abd: Soft, Non Tender, Non Distended, BS present, no guarding or organomegaly Genital Exam: not done Ext: No edema, warm Neuro: Alert and oriented, No gross deficits   Assessment/Plan:  HYPERTENSION, BENIGN SYSTEMIC Well controlled. Continue current  management for now. Patient slightly brady at 53 however asymptomatic. Return precautions provided for if she becomes symptomatic, would consider decreasing dose of metoprolol.  Diabetes mellitus type 2, controlled, without complications A1c 5.6. Suggested today that we discontinue metformin, however patient asks if we can go down to a low dose and recheck A1c in 3 months, potentially stopping the medication at that time. This seems reasonable. - decrease 1000 BID metformin to 500 mg daily for now - likely will discontinue metformin in 3 months to avoid over-controlling diabetes - continue to encourage positive lifestyle changes  Obesity (BMI 30-39.9) Continue to encourage diet and exercise as noted above.  Healthcare maintenance Zoster vaccine script sent to [facility]. Colonoscopy information provided.   Hyperlipidemia Lipid panel drawn at today's visit, HLD well controlled on current dose of statin with ASCVD risk score <5%. Continue current management as well as diet and exercise.   Orders Placed This Encounter  Procedures  . Varicella-zoster vaccine IM (Shingrix)  . Lipid panel    Order Specific Question:   Has the patient fasted?    Answer:   No  . HgB A1c    Meds ordered this encounter  Medications  . metFORMIN (GLUCOPHAGE) 500 MG tablet    Sig: Take 1 tablet (500 mg total) by mouth daily.    Dispense:  90 tablet    Refill:  0     Terri PouchLauren Malika Demario, MD,MS,  PGY3 05/01/2018 2:26 PM

## 2018-05-01 ENCOUNTER — Other Ambulatory Visit: Payer: Self-pay | Admitting: Student in an Organized Health Care Education/Training Program

## 2018-05-01 ENCOUNTER — Encounter: Payer: Self-pay | Admitting: Student in an Organized Health Care Education/Training Program

## 2018-05-01 DIAGNOSIS — Z23 Encounter for immunization: Secondary | ICD-10-CM

## 2018-05-01 LAB — LIPID PANEL
CHOL/HDL RATIO: 2.4 ratio (ref 0.0–4.4)
Cholesterol, Total: 121 mg/dL (ref 100–199)
HDL: 51 mg/dL (ref >39)
LDL Calculated: 57 mg/dL (ref 0–99)
Triglycerides: 65 mg/dL (ref 0–149)
VLDL Cholesterol Cal: 13 mg/dL (ref 5–40)

## 2018-05-01 NOTE — Assessment & Plan Note (Signed)
A1c 5.6. Suggested today that we discontinue metformin, however patient asks if we can go down to a low dose and recheck A1c in 3 months, potentially stopping the medication at that time. This seems reasonable. - decrease 1000 BID metformin to 500 mg daily for now - likely will discontinue metformin in 3 months to avoid over-controlling diabetes - continue to encourage positive lifestyle changes

## 2018-05-01 NOTE — Assessment & Plan Note (Signed)
Lipid panel drawn at today's visit, HLD well controlled on current dose of statin with ASCVD risk score <5%. Continue current management as well as diet and exercise.

## 2018-05-01 NOTE — Telephone Encounter (Signed)
Resent order for Shingrix electronically to [facility]

## 2018-05-01 NOTE — Assessment & Plan Note (Signed)
Well controlled. Continue current management for now. Patient slightly brady at 53 however asymptomatic. Return precautions provided for if she becomes symptomatic, would consider decreasing dose of metoprolol.

## 2018-05-01 NOTE — Assessment & Plan Note (Signed)
Continue to encourage diet and exercise as noted above.

## 2018-05-01 NOTE — Assessment & Plan Note (Signed)
Zoster vaccine script sent to [facility]. Colonoscopy information provided.

## 2018-05-03 NOTE — Telephone Encounter (Signed)
Informed pt that I contacted pharmacy and they did not receive the order but stated that if she is over 50 she does not have to have order/Rx from doctor.  Pt stated that she will try to go tonight to get this. Lamonte SakaiZimmerman Rumple, April D, [place]

## 2018-05-09 ENCOUNTER — Other Ambulatory Visit: Payer: Self-pay

## 2018-05-09 MED ORDER — TRAMADOL HCL 50 MG PO TABS: 50.0000 mg | ORAL_TABLET | Freq: Three times a day (TID) | ORAL | 0 refills | Status: DC | PRN

## 2018-06-06 DIAGNOSIS — H524 Presbyopia: Secondary | ICD-10-CM | POA: Diagnosis not present

## 2018-06-06 DIAGNOSIS — E119 Type 2 diabetes mellitus without complications: Secondary | ICD-10-CM | POA: Diagnosis not present

## 2018-06-06 LAB — HM DIABETES EYE EXAM

## 2018-06-11 ENCOUNTER — Other Ambulatory Visit: Payer: Self-pay | Admitting: Student in an Organized Health Care Education/Training Program

## 2018-06-13 ENCOUNTER — Encounter: Payer: Self-pay | Admitting: Cardiology

## 2018-06-17 ENCOUNTER — Other Ambulatory Visit: Payer: Self-pay | Admitting: Family Medicine

## 2018-06-17 ENCOUNTER — Other Ambulatory Visit: Payer: Self-pay | Admitting: Student in an Organized Health Care Education/Training Program

## 2018-06-20 ENCOUNTER — Other Ambulatory Visit: Payer: Self-pay

## 2018-06-20 ENCOUNTER — Encounter: Payer: Self-pay | Admitting: Student in an Organized Health Care Education/Training Program

## 2018-06-24 MED ORDER — ATORVASTATIN CALCIUM 80 MG PO TABS: 80.0000 mg | ORAL_TABLET | Freq: Every day | ORAL | 3 refills | Status: DC

## 2018-06-24 MED ORDER — ISOSORBIDE MONONITRATE ER 30 MG PO TB24: ORAL_TABLET | ORAL | 3 refills | Status: DC

## 2018-06-28 ENCOUNTER — Ambulatory Visit (INDEPENDENT_AMBULATORY_CARE_PROVIDER_SITE_OTHER): Payer: Medicare Other | Admitting: Cardiology

## 2018-06-28 ENCOUNTER — Encounter: Payer: Self-pay | Admitting: Cardiology

## 2018-06-28 VITALS — BP 112/80 | HR 47 | Ht 62.0 in | Wt 181.6 lb

## 2018-06-28 DIAGNOSIS — I10 Essential (primary) hypertension: Secondary | ICD-10-CM

## 2018-06-28 DIAGNOSIS — I2510 Atherosclerotic heart disease of native coronary artery without angina pectoris: Secondary | ICD-10-CM | POA: Diagnosis not present

## 2018-06-28 DIAGNOSIS — E785 Hyperlipidemia, unspecified: Secondary | ICD-10-CM | POA: Diagnosis not present

## 2018-06-28 MED ORDER — AMLODIPINE BESYLATE 10 MG PO TABS: 10.0000 mg | ORAL_TABLET | Freq: Every day | ORAL | 2 refills | Status: DC

## 2018-06-28 MED ORDER — ISOSORBIDE MONONITRATE ER 30 MG PO TB24: ORAL_TABLET | ORAL | 3 refills | Status: DC

## 2018-06-28 MED ORDER — LOSARTAN POTASSIUM 25 MG PO TABS: 25.0000 mg | ORAL_TABLET | Freq: Every day | ORAL | 3 refills | Status: DC

## 2018-06-28 MED ORDER — HYDROCHLOROTHIAZIDE 25 MG PO TABS
25.0000 mg | ORAL_TABLET | Freq: Every day | ORAL | 3 refills | Status: DC
Start: 2018-06-28 — End: 2019-09-24

## 2018-06-28 MED ORDER — ATORVASTATIN CALCIUM 80 MG PO TABS: 80.0000 mg | ORAL_TABLET | Freq: Every day | ORAL | 3 refills | Status: DC

## 2018-06-28 MED ORDER — METOPROLOL TARTRATE 25 MG PO TABS
12.5000 mg | ORAL_TABLET | Freq: Two times a day (BID) | ORAL | 3 refills | Status: DC
Start: 2018-06-28 — End: 2018-08-23

## 2018-06-28 NOTE — Patient Instructions (Signed)
Medication Instructions:   DECREASE YOUR METOPROLOL TARTRATE TO 12.5 MG BY MOUTH TWICE DAILY     Follow-Up:  Your physician wants you to follow-up in: ONE YEAR WITH DR Johnell ComingsNELSON You will receive a reminder letter in the mail two months in advance. If you don't receive a letter, please call our office to schedule the follow-up appointment.        If you need a refill on your cardiac medications before your next appointment, please call your pharmacy.

## 2018-06-28 NOTE — Progress Notes (Signed)
Patient Name: Terri Estrada Date of Encounter: 06/28/2018  Primary Care Provider:  Howard PouchFeng, Lauren, MD Primary Cardiologist:  Dr Delton SeeNelson  Problem List   Past Medical History:  Diagnosis Date  . Aortic stenosis   . Arthritis   . Coronary atherosclerosis of unspecified type of vessel, native or graft   . Diverticulosis of colon (without mention of hemorrhage)   . Essential hypertension, benign   . MI (myocardial infarction) (HCC)   . Obesity, unspecified   . Onychia and paronychia of toe   . Osteoarthrosis, unspecified whether generalized or localized, unspecified site   . Other and unspecified hyperlipidemia   . Sickle-cell trait (HCC)   . Streptococcal sore throat   . Type II or unspecified type diabetes mellitus without mention of complication, not stated as uncontrolled    Past Surgical History:  Procedure Laterality Date  . ANGIOPLASTY  09/21/00  . CAROTID STENT  09/21/00    Allergies  Allergies  Allergen Reactions  . Ace Inhibitors     REACTION: cough  . Zocor [Simvastatin] Other (See Comments)    Muscle aches (severe) in legs; has taken Lipitor without any issues    HPI  67 y.o. female who presents for follow up, she is a former Dr Daleen SquibbWall patient, last seen by me in 2016. She has a distant h/o MI in 2001 with stenting, felt like heart burn at that time, no symptoms since then. Able to perform all the ADL.  The patient is coming after 3 years, she feels and looks great, she goes to gym to swim and walk 5 or 6 any chest pain shortness of breath she also extremity edema.  She is if she walks more than.  She is medication side effects.  Home Medications  Prior to Admission medications   Medication Sig Start Date End Date Taking? Authorizing Provider  amLODipine (NORVASC) 10 MG tablet Take 1 tablet (10 mg total) by mouth daily. 01/22/18  Yes Arvilla MarketWallace, Catherine Lauren, DO  aspirin EC 81 MG tablet Take 1 tablet (81 mg total) by mouth daily. 10/06/15  Yes Lars MassonNelson,  Jareth Pardee H, MD  atorvastatin (LIPITOR) 80 MG tablet Take 1 tablet (80 mg total) by mouth daily. 06/24/18  Yes Howard PouchFeng, Lauren, MD  Calcium Carbonate (CALCIUM 600) 1500 MG TABS Take 600 mg by mouth 2 (two) times daily.    Yes [provider]  hydrochlorothiazide (HYDRODIURIL) 25 MG tablet Take 1 tablet (25 mg total) by mouth daily. 10/03/17  Yes Mayo, Allyn KennerKaty Dodd, MD  isosorbide mononitrate (IMDUR) 30 MG 24 hr tablet TAKE 1 TABLET (30 MG TOTAL) BY MOUTH DAILY. 06/24/18  Yes Howard PouchFeng, Lauren, MD  losartan (COZAAR) 25 MG tablet TAKE ONE TABLET BY MOUTH DAILY 10/12/17  Yes Arvilla MarketWallace, Catherine Lauren, DO  metFORMIN (GLUCOPHAGE) 500 MG tablet Take 1 tablet (500 mg total) by mouth daily. 04/30/18  Yes Howard PouchFeng, Lauren, MD  metoprolol tartrate (LOPRESSOR) 25 MG tablet Take 1 tablet (25 mg total) by mouth 2 (two) times daily. 02/22/18  Yes Arvilla MarketWallace, Catherine Lauren, DO  Multiple Vitamins-Minerals (MULTIVITAMINS THER. W/MINERALS) TABS Take 1 tablet by mouth daily.     Yes [provider]  Omega-3 Fatty Acids (FISH OIL) 1200 MG CAPS Take 1 capsule by mouth 2 (two) times daily.     Yes [provider]  traMADol (ULTRAM) 50 MG tablet TAKE ONE TABLET BY MOUTH EVERY 8 HOURS AS NEEDED 06/12/18  Yes Howard PouchFeng, Lauren, MD    Family History  Family History  Problem Relation Age of Onset  . Sickle cell anemia Daughter   . Hypertension Mother   . Diabetes type II Mother   . Diabetes type II Sister     Social History  Social History   Socioeconomic History  . Marital status: Married    Spouse name: Not on file  . Number of children: 2  . Years of education: Not on file  . Highest education level: Not on file  Occupational History  . Occupation: DAY Associate Professor: SELF-EMPLOYED  Social Needs  . Financial resource strain: Not on file  . Food insecurity:    Worry: Not on file    Inability: Not on file  . Transportation needs:    Medical: Not on file    Non-medical: Not on file  Tobacco Use  .  Smoking status: Former Games developer  . Smokeless tobacco: Never Used  . Tobacco comment: quit nov 2001  Substance and Sexual Activity  . Alcohol use: No  . Drug use: No  . Sexual activity: Yes    Birth control/protection: Post-menopausal  Lifestyle  . Physical activity:    Days per week: Not on file    Minutes per session: Not on file  . Stress: Not on file  Relationships  . Social connections:    Talks on phone: Not on file    Gets together: Not on file    Attends religious service: Not on file    Active member of club or organization: Not on file    Attends meetings of clubs or organizations: Not on file    Relationship status: Not on file  . Intimate partner violence:    Fear of current or ex partner: Not on file    Emotionally abused: Not on file    Physically abused: Not on file    Forced sexual activity: Not on file  Other Topics Concern  . Not on file  Social History Narrative   .soc     Review of Systems, as per HPI, otherwise negative General:  No chills, fever, night sweats or weight changes.  Cardiovascular:  No chest pain, dyspnea on exertion, edema, orthopnea, palpitations, paroxysmal nocturnal dyspnea. Dermatological: No rash, lesions/masses Respiratory: No cough, dyspnea Urologic: No hematuria, dysuria Abdominal:   No nausea, vomiting, diarrhea, bright red blood per rectum, melena, or hematemesis Neurologic:  No visual changes, wkns, changes in mental status. All other systems reviewed and are otherwise negative except as noted above.  Physical Exam  Blood pressure 112/80, pulse (!) 47, height 5\' 2"  (1.575 m), weight 181 lb 9.6 oz (82.4 kg), SpO2 97 %.  General: Pleasant, NAD Psych: Normal affect. Neuro: Alert and oriented X 3. Moves all extremities spontaneously. HEENT: Normal  Neck: Supple without bruits or JVD. Lungs:  Resp regular and unlabored, CTA. Heart: RRR no s3, s4, or murmurs. Abdomen: Soft, non-tender, non-distended, BS + x 4.  Extremities: No  clubbing, cyanosis or edema. DP/PT/Radials 2+ and equal bilaterally.  Labs:  No results for input(s): CKTOTAL, CKMB, TROPONINI in the last 72 hours. Lab Results  Component Value Date   WBC 5.0 10/04/2010   HGB 11.1 (L) 10/04/2010   HCT 34.2 (L) 10/04/2010   MCV 85.9 10/04/2010   PLT 384 10/04/2010    No results found for: DDIMER Invalid input(s): POCBNP    Component Value Date/Time   NA 143 09/13/2017 0918   K 4.0 09/13/2017 0918   CL 101 09/13/2017 0918   CO2 27 09/13/2017 9147  GLUCOSE 85 09/13/2017 0918   GLUCOSE 86 04/10/2016 1438   BUN 15 09/13/2017 0918   CREATININE 0.78 09/13/2017 0918   CREATININE 0.60 04/10/2016 1438   CALCIUM 9.9 09/13/2017 0918   PROT 6.7 02/27/2017 1010   ALBUMIN 4.3 02/27/2017 1010   AST 28 02/27/2017 1010   ALT 23 02/27/2017 1010   ALKPHOS 75 02/27/2017 1010   BILITOT 0.4 02/27/2017 1010   GFRNONAA 80 09/13/2017 0918   GFRNONAA 76 03/15/2012 1520   GFRAA 92 09/13/2017 0918   GFRAA 88 03/15/2012 1520   Lab Results  Component Value Date   CHOL 121 04/30/2018   HDL 51 04/30/2018   LDLCALC 57 04/30/2018   TRIG 65 04/30/2018    Accessory Clinical Findings  Echocardiogram - 01/20/2016 - Left ventricle: The cavity size was normal. Wall thickness was   normal. Systolic function was normal. The estimated ejection   fraction was in the range of 60% to 65%. Wall motion was normal;   there were no regional wall motion abnormalities. Doppler   parameters are consistent with abnormal left ventricular   relaxation (grade 1 diastolic dysfunction). - Aortic valve: Trileaflet; mildly thickened, mildly calcified   leaflets. There was no stenosis. - Mitral valve: Mildly thickened leaflets . There was mild   regurgitation. - Left atrium: The atrium was moderately dilated. - Right atrium: The atrium was mildly dilated. - Tricuspid valve: There was mild regurgitation. - Pulmonic valve: There was mild regurgitation. - Pulmonary arteries: Systolic  pressure was mildly increased. PA   peak pressure: 37 mm Hg (S).  ECG -sinus bradycardia with 47 bpm, inferior infarct age undetermined, nonspecific ST abnormalities, unchanged from prior, personally reviewed.    Assessment & Plan  1.  CAD, h/o MI in 2001, stable, asymptomatic, on high dose atorvastatin, aspirin 81 mg po daily.  Decrease metoprolol to 12.5 mg p.o. twice daily as she is significantly bradycardic. ECG unchanged from prior.  2. Hypertension - well controlled.  3. Hyperlipidemia - all lipids at goal in July 2019.  Follow-up in 1 year.  Tobias Alexander, MD, [facility] 06/28/2018, 8:46 AM

## 2018-07-09 ENCOUNTER — Other Ambulatory Visit: Payer: Self-pay | Admitting: Student in an Organized Health Care Education/Training Program

## 2018-07-09 DIAGNOSIS — Z1231 Encounter for screening mammogram for malignant neoplasm of breast: Secondary | ICD-10-CM

## 2018-07-19 ENCOUNTER — Other Ambulatory Visit: Payer: Self-pay | Admitting: Student in an Organized Health Care Education/Training Program

## 2018-07-19 DIAGNOSIS — E119 Type 2 diabetes mellitus without complications: Secondary | ICD-10-CM

## 2018-07-23 DIAGNOSIS — N816 Rectocele: Secondary | ICD-10-CM | POA: Diagnosis not present

## 2018-07-23 DIAGNOSIS — N8111 Cystocele, midline: Secondary | ICD-10-CM | POA: Diagnosis not present

## 2018-07-29 ENCOUNTER — Other Ambulatory Visit: Payer: Self-pay | Admitting: Student in an Organized Health Care Education/Training Program

## 2018-08-23 ENCOUNTER — Other Ambulatory Visit: Payer: Self-pay

## 2018-08-23 ENCOUNTER — Ambulatory Visit (INDEPENDENT_AMBULATORY_CARE_PROVIDER_SITE_OTHER): Payer: Medicare Other | Admitting: Student in an Organized Health Care Education/Training Program

## 2018-08-23 ENCOUNTER — Encounter: Payer: Self-pay | Admitting: Student in an Organized Health Care Education/Training Program

## 2018-08-23 VITALS — BP 130/72 | HR 55 | Temp 98.7°F | Ht 62.0 in | Wt 182.4 lb

## 2018-08-23 DIAGNOSIS — E119 Type 2 diabetes mellitus without complications: Secondary | ICD-10-CM | POA: Diagnosis not present

## 2018-08-23 DIAGNOSIS — Z23 Encounter for immunization: Secondary | ICD-10-CM

## 2018-08-23 DIAGNOSIS — I10 Essential (primary) hypertension: Secondary | ICD-10-CM

## 2018-08-23 DIAGNOSIS — E785 Hyperlipidemia, unspecified: Secondary | ICD-10-CM

## 2018-08-23 LAB — POCT GLYCOSYLATED HEMOGLOBIN (HGB A1C): HBA1C, POC (CONTROLLED DIABETIC RANGE): 5.7 % (ref 0.0–7.0)

## 2018-08-23 NOTE — Progress Notes (Signed)
   CC: Follow up diabetes, HTN  HPI: Terri Estrada is a 67 y.o. female, PMH MI in 2001, HTN, T2DM, presents for f/u chronic medical problems.  HYPERTENSION Disease Monitoring: BP controlled in the office today  Chest pain, palpitations- denies       Dyspnea- denies Medications: Imdur, HCTZ, losartan, metoprolol Compliance- Has difficulty splitting metoprolol pills in half because they are so small  Lightheadedness,Syncope- denies    Edema- denies  DIABETES Disease Monitoring: A1c 5.7 from 5.6 on last check 3 months ago Polyuria/phagia/dipsia- Denies       Visual problems- Denies Medications: Metformin 500 mg daily Compliance- 100% reported  Hypoglycemic symptoms- denies Patient ison ASA. Microalbuminuria testing deferred as patient is already on an ACE/ARB.  She is exercising by walking 3 times per week and [facility]. She has been mindful of her diet and portion sizes.  Health maintenance - flu shot today. Already has colonoscopy scheduled.  Monitoring Labs and Parameters Last A1C:  Lab Results  Component Value Date   HGBA1C 5.7 08/23/2018    Last Lipid:     Component Value Date/Time   CHOL 121 04/30/2018 0931   HDL 51 04/30/2018 0931   LDLDIRECT 63 03/15/2012 1520    Last Bmet  Potassium  Date Value Ref Range Status  09/13/2017 4.0 3.5 - 5.2 mmol/L Final   Sodium  Date Value Ref Range Status  09/13/2017 143 134 - 144 mmol/L Final   Creat  Date Value Ref Range Status  04/10/2016 0.60 0.50 - 0.99 mg/dL Final    Comment:      For patients > or = 66 years of age: The upper reference limit for Creatinine is approximately 13% higher for people identified as African-American.      Creatinine, Ser  Date Value Ref Range Status  09/13/2017 0.78 0.57 - 1.00 mg/dL Final      Last BPs:  BP Readings from Last 3 Encounters:  08/23/18 130/72  06/28/18 112/80  04/30/18 118/62    Review of Symptoms:  See HPI for ROS.   CC,  SH/smoking status, and VS noted.  Objective: BP 130/72   Pulse (!) 55   Temp 98.7 F (37.1 C) (Oral)   Ht 5\' 2"  (1.575 m)   Wt 182 lb 6.4 oz (82.7 kg)   SpO2 98%   BMI 33.36 kg/m  GEN: NAD, alert, cooperative, and pleasant. RESPIRATORY: Comfortable work of breathing, speaks in full sentences CV: Regular rate noted, distal extremities well perfused and warm without edema GI: Soft, nondistended SKIN: warm and dry, no rashes or lesions NEURO: II-XII grossly intact MSK: Moves 4 extremities equally PSYCH: AAOx3, appropriate affect  Assessment and plan:  1. T2DM - over-controlled. Goal would be <6.5. Patient agreeable to coming off of metformin. She will continue to focus on diet and exercise over the course of the holiday season - follow up in 3 months for next A1c  2. HTN - pulse low at 55. Patient has been taking metoprolol 12.5 mg daily. We can stop this as her MI was in 2001 and her pulse and HTN are controlled. Recheck BP and pulse at next OV for T2DM.  Orders Placed This Encounter  Procedures  . Flu Vaccine QUAD 36+ mos IM  . POCT glycosylated hemoglobin (Hb A1C)    No orders of the defined types were placed in this encounter.    Howard PouchLauren Oziah Vitanza, MD,MS,  PGY3 08/23/2018 10:46 AM

## 2018-08-23 NOTE — Patient Instructions (Signed)
It was a pleasure seeing you today in our clinic. Today we discussed diabetes and blood pressure. Here is the treatment plan we have discussed and agreed upon together:  STOP metoprolol and metformin  Follow up in 3 months for next A1c.  Our clinic's number is [phone]. Please call with questions or concerns about what we discussed today.  Be well, Dr. Mosetta Putt   Diabetes Mellitus and Nutrition When you have diabetes (diabetes mellitus), it is very important to have healthy eating habits because your blood sugar (glucose) levels are greatly affected by what you eat and drink. Eating healthy foods in the appropriate amounts, at about the same times every day, can help you:  Control your blood glucose.  Lower your risk of heart disease.  Improve your blood pressure.  Reach or maintain a healthy weight.  Every person with diabetes is different, and each person has different needs for a meal plan. Your health care provider may recommend that you work with a diet and nutrition specialist (dietitian) to make a meal plan that is best for you. Your meal plan may vary depending on factors such as:  The calories you need.  The medicines you take.  Your weight.  Your blood glucose, blood pressure, and cholesterol levels.  Your activity level.  Other health conditions you have, such as heart or kidney disease.  How do carbohydrates affect me? Carbohydrates affect your blood glucose level more than any other type of food. Eating carbohydrates naturally increases the amount of glucose in your blood. Carbohydrate counting is a method for keeping track of how many carbohydrates you eat. Counting carbohydrates is important to keep your blood glucose at a healthy level, especially if you use insulin or take certain oral diabetes medicines. It is important to know how many carbohydrates you can safely have in each meal. This is different for every person. Your dietitian can help you calculate how  many carbohydrates you should have at each meal and for snack. Foods that contain carbohydrates include:  Bread, cereal, rice, pasta, and crackers.  Potatoes and corn.  Peas, beans, and lentils.  Milk and yogurt.  Fruit and juice.  Desserts, such as cakes, cookies, ice cream, and candy.  How does alcohol affect me? Alcohol can cause a sudden decrease in blood glucose (hypoglycemia), especially if you use insulin or take certain oral diabetes medicines. Hypoglycemia can be a life-threatening condition. Symptoms of hypoglycemia (sleepiness, dizziness, and confusion) are similar to symptoms of having too much alcohol. If your health care provider says that alcohol is safe for you, follow these guidelines:  Limit alcohol intake to no more than 1 drink per day for nonpregnant women and 2 drinks per day for men. One drink equals 12 oz of beer, 5 oz of wine, or 1 oz of hard liquor.  Do not drink on an empty stomach.  Keep yourself hydrated with water, diet soda, or unsweetened iced tea.  Keep in mind that regular soda, juice, and other mixers may contain a lot of sugar and must be counted as carbohydrates.  What are tips for following this plan? Reading food labels  Start by checking the serving size on the label. The amount of calories, carbohydrates, fats, and other nutrients listed on the label are based on one serving of the food. Many foods contain more than one serving per package.  Check the total grams (g) of carbohydrates in one serving. You can calculate the number of servings of carbohydrates in one serving  by dividing the total carbohydrates by 15. For example, if a food has 30 g of total carbohydrates, it would be equal to 2 servings of carbohydrates.  Check the number of grams (g) of saturated and trans fats in one serving. Choose foods that have low or no amount of these fats.  Check the number of milligrams (mg) of sodium in one serving. Most people should limit total  sodium intake to less than 2,300 mg per day.  Always check the nutrition information of foods labeled as "low-fat" or "nonfat". These foods may be higher in added sugar or refined carbohydrates and should be avoided.  Talk to your dietitian to identify your daily goals for nutrients listed on the label. Shopping  Avoid buying canned, premade, or processed foods. These foods tend to be high in fat, sodium, and added sugar.  Shop around the outside edge of the grocery store. This includes fresh fruits and vegetables, bulk grains, fresh meats, and fresh dairy. Cooking  Use low-heat cooking methods, such as baking, instead of high-heat cooking methods like deep frying.  Cook using healthy oils, such as olive, canola, or sunflower oil.  Avoid cooking with butter, cream, or high-fat meats. Meal planning  Eat meals and snacks regularly, preferably at the same times every day. Avoid going long periods of time without eating.  Eat foods high in fiber, such as fresh fruits, vegetables, beans, and whole grains. Talk to your dietitian about how many servings of carbohydrates you can eat at each meal.  Eat 4-6 ounces of lean protein each day, such as lean meat, chicken, fish, eggs, or tofu. 1 ounce is equal to 1 ounce of meat, chicken, or fish, 1 egg, or 1/4 cup of tofu.  Eat some foods each day that contain healthy fats, such as avocado, nuts, seeds, and fish. Lifestyle   Check your blood glucose regularly.  Exercise at least 30 minutes 5 or more days each week, or as told by your health care provider.  Take medicines as told by your health care provider.  Do not use any products that contain nicotine or tobacco, such as cigarettes and e-cigarettes. If you need help quitting, ask your health care provider.  Work with a Veterinary surgeoncounselor or diabetes educator to identify strategies to manage stress and any emotional and social challenges. What are some questions to ask my health care provider?  Do  I need to meet with a diabetes educator?  Do I need to meet with a dietitian?  What number can I call if I have questions?  When are the best times to check my blood glucose? Where to find more information:  American Diabetes Association: diabetes.org/food-and-fitness/food  Academy of Nutrition and Dietetics: https://www.vargas.com/www.eatright.org/resources/health/diseases-and-conditions/diabetes  [employer] Institute of Diabetes and Digestive and Kidney Diseases (NIH): FindJewelers.czwww.niddk.nih.gov/health-information/diabetes/overview/diet-eating-physical-activity Summary  A healthy meal plan will help you control your blood glucose and maintain a healthy lifestyle.  Working with a diet and nutrition specialist (dietitian) can help you make a meal plan that is best for you.  Keep in mind that carbohydrates and alcohol have immediate effects on your blood glucose levels. It is important to count carbohydrates and to use alcohol carefully. This information is not intended to replace advice given to you by your health care provider. Make sure you discuss any questions you have with your health care provider. Document Released: 06/22/2005 Document Revised: 10/30/2016 Document Reviewed: 10/30/2016 Elsevier Interactive Patient Education  [employer]2018 Elsevier Inc.

## 2018-08-26 ENCOUNTER — Ambulatory Visit (INDEPENDENT_AMBULATORY_CARE_PROVIDER_SITE_OTHER): Payer: Medicare Other | Admitting: Podiatry

## 2018-08-26 ENCOUNTER — Encounter: Payer: Self-pay | Admitting: Podiatry

## 2018-08-26 VITALS — BP 123/69 | HR 64 | Resp 16

## 2018-08-26 DIAGNOSIS — E119 Type 2 diabetes mellitus without complications: Secondary | ICD-10-CM | POA: Diagnosis not present

## 2018-08-26 DIAGNOSIS — Z794 Long term (current) use of insulin: Principal | ICD-10-CM

## 2018-08-26 DIAGNOSIS — M779 Enthesopathy, unspecified: Secondary | ICD-10-CM

## 2018-09-01 NOTE — Progress Notes (Signed)
Subjective: 67 y.o. returns the office today for her yearly diabetic foot evaluation.  Overall she states that she is doing well she has had no open sores or any issues since I last saw her.  She still has some pain in the top of her right foot at times but overall she is been doing well.  Denies any acute changes since last appointment and no new complaints today. Denies any systemic complaints such as fevers, chills, nausea, vomiting.   PCP: Howard PouchFeng, Lauren, MD  A1c: 5.7  Objective: AAO 3, NAD DP/PT pulses palpable, CRT less than 3 seconds Sensation intact with Simms Weinstein monofilament, vibratory sensation intact. Nails are minimally dystrophic, elongated, brittle, discolored 10. There is no tenderness overlying the nails 1-5 bilaterally. There is no surrounding erythema or drainage along the nail sites. No open lesions or pre-ulcerative lesions are identified.  Dorsal spurring present of the right midfoot which is subjectively causing some discomfort with pressure in shoes.  No edema, erythema to this area. No other areas of tenderness bilateral lower extremities. No overlying edema, erythema, increased warmth. No pain with calf compression, swelling, warmth, erythema.  Assessment: Patient presents diabetic foot evaluation; right dorsal spur  Plan: -Treatment options including alternatives, risks, complications were discussed -Nails sharply debrided 10 without complication/bleeding.  I do this as a courtesy today. -We discussed releasing her shoes and different techniques to help take pressure off the area.  She has not tried this.  She is change the lacing of her walking shoes as this is when she is majority of tenderness. -Discussed daily foot inspection. If there are any changes, to call the office immediately.  -Follow-up in 1 year or sooner if any problems are to arise. In the meantime, encouraged to call the office with any questions, concerns, changes symptoms.  Ovid CurdMatthew  Azriel Jakob, DPM

## 2018-09-03 ENCOUNTER — Ambulatory Visit: Payer: Medicare Other

## 2018-09-03 ENCOUNTER — Other Ambulatory Visit: Payer: Self-pay | Admitting: Student in an Organized Health Care Education/Training Program

## 2018-09-03 NOTE — Telephone Encounter (Signed)
Covering for Dr. Mosetta PuttFeng, will provide electronic refill x 1 month.

## 2018-10-17 ENCOUNTER — Ambulatory Visit
Admission: RE | Admit: 2018-10-17 | Discharge: 2018-10-17 | Disposition: A | Discharge status: Home / Self Care | Payer: Medicare Other | Source: Ambulatory Visit | Attending: Family Medicine | Admitting: Family Medicine

## 2018-10-17 DIAGNOSIS — Z1231 Encounter for screening mammogram for malignant neoplasm of breast: Secondary | ICD-10-CM

## 2018-10-23 DIAGNOSIS — N816 Rectocele: Secondary | ICD-10-CM | POA: Diagnosis not present

## 2018-10-23 DIAGNOSIS — N8111 Cystocele, midline: Secondary | ICD-10-CM | POA: Diagnosis not present

## 2018-10-24 ENCOUNTER — Other Ambulatory Visit: Payer: Self-pay | Admitting: Student in an Organized Health Care Education/Training Program

## 2018-10-24 ENCOUNTER — Other Ambulatory Visit: Payer: Self-pay | Admitting: Family Medicine

## 2018-10-24 DIAGNOSIS — E119 Type 2 diabetes mellitus without complications: Secondary | ICD-10-CM

## 2018-10-25 NOTE — Telephone Encounter (Signed)
To white team. 

## 2018-11-04 ENCOUNTER — Other Ambulatory Visit: Payer: Self-pay | Admitting: Family Medicine

## 2018-11-27 ENCOUNTER — Ambulatory Visit: Payer: Medicare Other | Admitting: Student in an Organized Health Care Education/Training Program

## 2018-12-02 ENCOUNTER — Encounter: Payer: Self-pay | Admitting: Family Medicine

## 2018-12-02 DIAGNOSIS — K5730 Diverticulosis of large intestine without perforation or abscess without bleeding: Secondary | ICD-10-CM | POA: Diagnosis not present

## 2018-12-02 DIAGNOSIS — Z1211 Encounter for screening for malignant neoplasm of colon: Secondary | ICD-10-CM | POA: Diagnosis not present

## 2018-12-05 ENCOUNTER — Other Ambulatory Visit: Payer: Self-pay | Admitting: Family Medicine

## 2018-12-26 ENCOUNTER — Telehealth: Payer: Self-pay | Admitting: Student in an Organized Health Care Education/Training Program

## 2018-12-26 NOTE — Telephone Encounter (Signed)
Due to ongoing concerns with social distancing in light of COVID-19 cases, I called this patient to offer options for their visit on 3/23. Their visit was made for diabetes follow up.   I offered to discuss their care via phone and prescribe medications and order labs as needed, in order to spare them potential exposure to the community by coming to our office. They elected to postpone the A1c check for another month.  Diabetes Patient was taken off of metformin for a trial at her last visit.  She reports that she continues to walk and focus on exercise.  She reports that she has not had any polyuria or polydipsia.  Last A1c was 4 months ago and was found to be 5.7.  -Engaged in their decision making with the patient, I believe it is safe to postpone her A1c by 1 month -Also gave the patient the option of coming in for an A1c as a lab only visit, she would prefer to postpone her entire visit to avoid the clinic -Patient may call to schedule an appointment in 1 month -Encouraged patient to call back with any questions  I counseled the patient that we are here for them during this trying time. I asked that they reach out via phone with any concerns regarding these problems or others that may come up. They were particularly counseled to call with concerns for cough, SOB, or fever.   This is a no charge visit.  Howard Pouch, MD

## 2018-12-30 ENCOUNTER — Ambulatory Visit: Payer: Medicare Other | Admitting: Student in an Organized Health Care Education/Training Program

## 2019-01-05 ENCOUNTER — Other Ambulatory Visit: Payer: Self-pay | Admitting: Family Medicine

## 2019-01-06 ENCOUNTER — Other Ambulatory Visit: Payer: Self-pay | Admitting: Family Medicine

## 2019-01-20 ENCOUNTER — Other Ambulatory Visit: Payer: Self-pay | Admitting: Student in an Organized Health Care Education/Training Program

## 2019-01-20 DIAGNOSIS — E119 Type 2 diabetes mellitus without complications: Secondary | ICD-10-CM

## 2019-02-11 ENCOUNTER — Telehealth: Payer: Self-pay | Admitting: Student in an Organized Health Care Education/Training Program

## 2019-02-11 NOTE — Telephone Encounter (Signed)
Pt called to make apt for a1c check for 05-06, and pt stated that she'd like to a call from Dr. Mosetta Putt, pt did not want to do virtual. Please pt a call back.

## 2019-02-12 ENCOUNTER — Other Ambulatory Visit (INDEPENDENT_AMBULATORY_CARE_PROVIDER_SITE_OTHER): Payer: Medicare Other

## 2019-02-12 ENCOUNTER — Other Ambulatory Visit: Payer: Self-pay

## 2019-02-12 DIAGNOSIS — E119 Type 2 diabetes mellitus without complications: Secondary | ICD-10-CM

## 2019-02-12 LAB — POCT GLYCOSYLATED HEMOGLOBIN (HGB A1C): HbA1c, POC (controlled diabetic range): 5.9 % (ref 0.0–7.0)

## 2019-02-17 NOTE — Telephone Encounter (Signed)
If the patient would like to discuss her A1c I am happy to do a virtual visit.

## 2019-02-18 NOTE — Telephone Encounter (Signed)
Contacted pt and informed her of below and schedule her a phone visit for 02/24/2019 with PCP.  Daphney Hopke Zimmerman Rumple, CMA

## 2019-02-24 ENCOUNTER — Other Ambulatory Visit: Payer: Self-pay

## 2019-02-24 ENCOUNTER — Encounter: Payer: Self-pay | Admitting: Student in an Organized Health Care Education/Training Program

## 2019-02-24 ENCOUNTER — Telehealth (INDEPENDENT_AMBULATORY_CARE_PROVIDER_SITE_OTHER): Payer: Medicare Other | Admitting: Student in an Organized Health Care Education/Training Program

## 2019-02-24 DIAGNOSIS — E119 Type 2 diabetes mellitus without complications: Secondary | ICD-10-CM | POA: Diagnosis not present

## 2019-02-24 NOTE — Assessment & Plan Note (Addendum)
Follow up in 3 months for next A1c (Early August 2020) Goal A1c <6.5% Continue diet and exercise for now, currently lifestyle-controlled diabetes Patient will be due for a lipid panel at next visit Patient already on an ARB and so we have not checked microalbuminuria  Continue ASA

## 2019-02-24 NOTE — Progress Notes (Signed)
Virtual Visit via Telephone Note  I connected with [name] on 02/24/19 at  2:10 PM EDT by telephone and verified that I am speaking with the correct person using two identifiers.  Location: Patient: Terri Estrada Provider: Howard Pouch, MD   I discussed the limitations, risks, security and privacy concerns of performing an evaluation and management service by telephone and the availability of in person appointments. I also discussed with the patient that there may be a patient responsible charge related to this service. The patient expressed understanding and agreed to proceed.   History of Present Illness: Patient presents for diabetes follow-up.  The patient was previously on metformin which was discontinued 6 months ago.  She is working on diet and exercise.  Her A1c is 5.9 from 5.7 previously.  She reports that she has had slightly decreased exercise recently due to rain.  She usually exercises by walking.  She is not had any aphasia, polyuria, polydipsia.  She denies any chest pain or shortness of breath.  She denies any neuropathy symptoms.    Observations/Objective: Pleasant and appropriate Linear thought process Speaks in full sentences without labored breathing  Assessment and Plan: 1. Controlled type 2 diabetes mellitus without complication, without long-term current use of insulin (HCC) Follow up in 3 months for next A1c (Early August 2020) Goal A1c <6.5% Continue diet and exercise for now, currently lifestyle-controlled diabetes Patient will be due for a lipid panel at next visit Patient already on an ARB and so we have not checked microalbuminuria  Continue ASA  Follow Up Instructions: Follow up in 3 months   I discussed the assessment and treatment plan with the patient. The patient was provided an opportunity to ask questions and all were answered. The patient agreed with the plan and demonstrated an understanding of the instructions.   The patient was advised  to call back or seek an in-person evaluation if the symptoms worsen or if the condition fails to improve as anticipated.  I provided 10 minutes of non-face-to-face time during this encounter.   Howard Pouch, MD

## 2019-02-26 ENCOUNTER — Other Ambulatory Visit: Payer: Self-pay

## 2019-02-26 NOTE — Patient Outreach (Signed)
[facility]) Care Management  02/26/2019  TRACY-LEE SKEENS Jul 17, 1951 [id number]   Medication Adherence call to Mrs. Shaquana Palmero Hippa Identifiers Verify spoke with patient she is due on Metformin 500 mg patient explain doctor took her off this medication in January/2020. Mrs. Wollam is showing past due under [facility] Ins.   Lillia Abed CPhT Pharmacy Technician [facility] Management Direct Dial [phone]  Fax [phone] [email or web address]

## 2019-03-09 ENCOUNTER — Other Ambulatory Visit: Payer: Self-pay | Admitting: Student in an Organized Health Care Education/Training Program

## 2019-03-12 ENCOUNTER — Other Ambulatory Visit: Payer: Self-pay | Admitting: Student in an Organized Health Care Education/Training Program

## 2019-03-12 MED ORDER — TRAMADOL HCL 50 MG PO TABS: 50.0000 mg | ORAL_TABLET | Freq: Three times a day (TID) | ORAL | 0 refills | Status: DC | PRN

## 2019-04-10 ENCOUNTER — Other Ambulatory Visit: Payer: Self-pay | Admitting: Student in an Organized Health Care Education/Training Program

## 2019-04-16 NOTE — Telephone Encounter (Signed)
Reviewed PMP and prior visit notes. I would like to discuss this with the patient. Please schedule her with me at my next available appointment.   Thank you!  Dr. Rosita Fire

## 2019-04-22 ENCOUNTER — Ambulatory Visit (INDEPENDENT_AMBULATORY_CARE_PROVIDER_SITE_OTHER): Payer: Medicare Other | Admitting: Family Medicine

## 2019-04-22 ENCOUNTER — Encounter: Payer: Self-pay | Admitting: Family Medicine

## 2019-04-22 ENCOUNTER — Other Ambulatory Visit: Payer: Self-pay

## 2019-04-22 VITALS — BP 128/72 | HR 66

## 2019-04-22 DIAGNOSIS — M159 Polyosteoarthritis, unspecified: Secondary | ICD-10-CM | POA: Diagnosis not present

## 2019-04-22 NOTE — Progress Notes (Signed)
Patient Name: Terri Estrada Date of Birth: July 17, 1951 Date of Visit: 04/24/19 PCP: Nicki GuadalajaraSimmons, , MD  Chief Complaint: Tramadol prescription   Subjective: Terri Estrada is a pleasant 68 y.o. with medical history significant for osteoarthritis  presenting today for medication counseling for tramadol.   Terri Estrada reports having osteoarthritis that she receives Tramadol prescriptions for intermittently. She states using the medication only when her pain is severe and not alleviated using tylenol arthritis or heating pads. Terri Estrada states that she walks regularly and has lost weight through this lifestyle change. She also reports that walking helps with her knee pain. She denies knee pain during today's visit.   Patient states that she last saw an orthopedic physician several years ago and is not interested in being evaluated by an orthopedic physician now as she believes they will recommend surgery. She emphasizes her desire to avoid surgery. She reports no recent xrays for her knee OA.   ROS:  Review of Systems  Constitutional: Negative for chills and fever.  HENT: Negative for congestion, ear pain and sore throat.   Eyes: Negative for blurred vision.  Respiratory: Negative for cough, shortness of breath and wheezing.   Cardiovascular: Negative for chest pain, palpitations and leg swelling.  Gastrointestinal: Negative for abdominal pain, constipation, diarrhea, nausea and vomiting.  Musculoskeletal: Negative for joint pain.  Skin: Negative for itching and rash.  Neurological: Negative for dizziness, tingling, weakness and headaches.     Physical Exam  Constitutional: She is oriented to person, place, and time. She appears well-nourished. No distress.  HENT:  Head: Normocephalic and atraumatic.  Eyes: Pupils are equal, round, and reactive to light. Conjunctivae and EOM are normal.  Neck: Normal range of motion. Neck supple.  Cardiovascular: Normal rate, regular rhythm, normal  heart sounds and intact distal pulses.  Pulmonary/Chest: Effort normal and breath sounds normal. No respiratory distress. She has no wheezes.  Abdominal: Soft. Bowel sounds are normal. She exhibits no distension. There is no abdominal tenderness.  Musculoskeletal: Normal range of motion.        General: No tenderness or edema.     Comments: Patient has normal range of motion of bilateral knee joints, no tenderness to palpation along bilateral joint line, no edema present, no erythema appreciated. Joint line integrity appear to be intact bilaterally on exam   Neurological: She is alert and oriented to person, place, and time.  Patient ambulates with normal gait pattern, ambulates without assistance    Skin: Skin is warm and dry. No rash noted. No erythema.     I have reviewed the patient's medical, surgical, family, and social history as appropriate.  Vitals:   04/22/19 1354  BP: 128/72  Pulse: 66  SpO2: 98%    Osteoarthritis Patient reports history of osteoarthritis pain in bilateral knee joints with more pain in left than right. Patient reports using Tramadol as last resort medication for severe pain only. Patient reports using heating pad or topical therapies for first line pain relief. Patient denies pain during visit today but is running out of Tramadol prescription and wanted to have a supply if needed in the future. Pain is not worsening and patient offered referral for orthopedic evaluation but denied as she is not interested in surgery at this time. Patient also offered referral for PT/OT as outpatient. Patient denied as she walks regularly and does not want to do extra exercising that may cause pain to worsen.  -Patient encouraged to continue using nonpharmacologic therapies Tylenol arthritis  and heating pads  -Patient encouraged to continue walking regularly for exercise  -Patient counseled to contact office if she would like to further pursue PT/OT or orthopedic referral       Return to care in PRN.   Stark Klein, MD  Family Medicine Teaching Service

## 2019-04-22 NOTE — Patient Instructions (Addendum)
Thank you for allowing me to take part in your care today! I look forward to working with you as your new primary care provider.   Today you were seen for your pain related to your osteoarthritis and tramadol medication for this pain.   We have agreed to not continue your tramadol prescription at this time because you are not currently interested in further evaluation.   Please let me know if you would like to further pursue evaluation of your OA related knee pain in the future and we can arrange for x-rays, physical therapy and referral to orthopedics.   Please return in 1 month for lipid panel and hbg A1c.    Thank you,  Dr. Rosita Fire

## 2019-04-22 NOTE — Assessment & Plan Note (Addendum)
Patient reports history of osteoarthritis pain in bilateral knee joints with more pain in left than right. Patient reports using Tramadol as last resort medication for severe pain only. Patient reports using heating pad or topical therapies for first line pain relief. Patient denies pain during visit today but is running out of Tramadol prescription and wanted to have a supply if needed in the future. Pain is not worsening and patient offered referral for orthopedic evaluation but denied as she is not interested in surgery at this time. Patient also offered referral for PT/OT as outpatient. Patient denied as she walks regularly and does not want to do extra exercising that may cause pain to worsen.  -Patient encouraged to continue using nonpharmacologic therapies Tylenol arthritis and heating pads  -Patient encouraged to continue walking regularly for exercise  -Patient counseled to contact office if she would like to further pursue PT/OT or orthopedic referral

## 2019-05-15 ENCOUNTER — Ambulatory Visit: Payer: Medicare Other | Admitting: Family Medicine

## 2019-05-23 ENCOUNTER — Encounter: Payer: Self-pay | Admitting: Family Medicine

## 2019-05-23 ENCOUNTER — Ambulatory Visit (INDEPENDENT_AMBULATORY_CARE_PROVIDER_SITE_OTHER): Payer: Medicare Other | Admitting: Family Medicine

## 2019-05-23 ENCOUNTER — Other Ambulatory Visit: Payer: Self-pay

## 2019-05-23 VITALS — BP 128/72 | HR 86 | Ht 62.0 in | Wt 193.2 lb

## 2019-05-23 DIAGNOSIS — Z0000 Encounter for general adult medical examination without abnormal findings: Secondary | ICD-10-CM | POA: Diagnosis not present

## 2019-05-23 DIAGNOSIS — E785 Hyperlipidemia, unspecified: Secondary | ICD-10-CM | POA: Diagnosis not present

## 2019-05-23 DIAGNOSIS — E119 Type 2 diabetes mellitus without complications: Secondary | ICD-10-CM

## 2019-05-23 LAB — POCT GLYCOSYLATED HEMOGLOBIN (HGB A1C): HbA1c, POC (controlled diabetic range): 6 % (ref 0.0–7.0)

## 2019-05-23 NOTE — Assessment & Plan Note (Signed)
Patient reports recent colonoscopy in December 2019.  Patient reports that she will bring information to the office to be put on file.  Per patient on colonoscopy she was found to have diverticulosis.

## 2019-05-23 NOTE — Assessment & Plan Note (Addendum)
Patient requesting hemoglobin A1c.  Patient states she has gained 10 pounds since being in quarantine.  Patient denies any numbness, tingling or burning sensations in her feet or hands. -Hemoglobin A1c measured today, 6.0 -Patient advised to continue exercise & dietary control -Patient to continue metformin 500 mg daily  Diabetic foot exam completed today in office.  Patient has sensation in all point areas of foot exam.  Patient has pes planus of bilateral feet.

## 2019-05-23 NOTE — Patient Instructions (Signed)
Thank you for allowing me to take part in your care today!  Today we discussed, coping with grief.  Please continue to speak with your therapist, notify either our office or your current therapist or go to the emergency room if you begin to have feelings of suicidal ideation.   We also discussed your pain management for your bilateral knee pain.  Please continue to use your Tylenol and exercise therapy to manage your pain.  If your pain worsens and you would like to pursue physical therapy, please notify us so we can set up a referral for you at your earliest convenience.  Today we measured your hemoglobin A1c, which was 6.0.  We also measured your cholesterol levels, blood count levels, renal, and liver function.  We will notify you of any abnormal results and advise you further from there.  Please return to care as needed.  Thank you, Dr. Rosita Fire

## 2019-05-23 NOTE — Assessment & Plan Note (Signed)
Patient reports recent depressed mood due to thinking of her daughter who passed away in 02/05/2017 from sickle cell anemia complications.  Patient reports seeing a therapist regularly.  And states that these visits are very helpful for her coping process.  Patient offered additional therapy, she states that her current therapy sessions are sufficient and she would not like additional therapies. -Patient encouraged to continue as scheduled with her current therapist -Patient advised to notify office, therapies or go to the ED if suicidal ideations develop

## 2019-05-23 NOTE — Progress Notes (Signed)
Patient Name: Terri Estrada Date of Birth: 05-01-1951 Date of Visit: 05/23/19 PCP: Nicki GuadalajaraSimmons, Jasin Brazel, MD  Chief Complaint:   Subjective: Terri BigDenise S Estrada is a pleasant 68 y.o. with medical history significant for HTN, osteoarthritis of bilateral knees,and DM2  presenting today for follow-up.  Terri Estrada was previously seen for evaluation of osteoarthritis in bilateral knees.  Patient was requesting refill of tramadol.  Patient and I discussed importance of managing his pain without controlled substances.  Patient agreed to continue with exercise therapy, Tylenol, and heat packs to relieve pain as she was not using tramadol frequently.  Per previous provider notes this prescription was meant to be short-term.  On exam patient is pleasantly conversational. Patient states that her daughter passed away 2 years ago and she is still coping with her loss.  She reports playing music, and talking with family and friends to share good memories.  Patient reports seeing therapist regularly and would not like additional therapies for coping mechanisms.  Patient reports having colonoscopy in December 2019 with diverticulosis.  Patient reports she will bring this paperwork and for filing.  Patient states she has no numbness or burning sensation in her feet.    ROS: Per HPI.   Review of Systems  Constitutional: Negative for chills, fever and weight loss.  HENT: Negative for congestion, ear pain and sore throat.   Eyes: Negative for blurred vision and redness.  Respiratory: Negative for cough, shortness of breath and wheezing.   Cardiovascular: Negative for chest pain.  Gastrointestinal: Negative for abdominal pain and nausea.  Musculoskeletal: Positive for joint pain.  Skin: Negative for itching and rash.  Neurological: Negative for headaches.    I have reviewed the patient's medical, surgical, family, and social history as appropriate.  Vitals:   05/23/19 1349  BP: 128/72  Pulse: 86  SpO2: 97%    Physical Exam Constitutional:      General: She is not in acute distress.    Appearance: Normal appearance. She is not ill-appearing or toxic-appearing.  HENT:     Nose: No rhinorrhea.     Mouth/Throat:     Mouth: Mucous membranes are moist.     Pharynx: Oropharynx is clear. No posterior oropharyngeal erythema.  Eyes:     Extraocular Movements: Extraocular movements intact.     Conjunctiva/sclera: Conjunctivae normal.     Pupils: Pupils are equal, round, and reactive to light.  Neck:     Musculoskeletal: Normal range of motion. No muscular tenderness.  Cardiovascular:     Rate and Rhythm: Normal rate and regular rhythm.     Heart sounds: No murmur. No friction rub. No gallop.   Pulmonary:     Effort: Pulmonary effort is normal. No respiratory distress.     Breath sounds: Normal breath sounds. No wheezing, rhonchi or rales.  Abdominal:     General: Bowel sounds are normal. There is no distension.     Palpations: Abdomen is soft. There is no mass.     Tenderness: There is no abdominal tenderness.  Musculoskeletal: Normal range of motion.        General: No swelling or deformity.     Left lower leg: No edema.     Comments: Minimal Tenderness in joint line of right knee   Neurological:     Mental Status: She is alert.     Depression Patient reports recent depressed mood due to thinking of her daughter who passed away in 2018 from sickle cell anemia complications.  Patient reports seeing  a therapist regularly.  And states that these visits are very helpful for her coping process.  Patient offered additional therapy, she states that her current therapy sessions are sufficient and she would not like additional therapies. -Patient encouraged to continue as scheduled with her current therapist -Patient advised to notify office, therapies or go to the ED if suicidal ideations develop  Diabetes mellitus type 2, controlled, without complications Patient requesting hemoglobin A1c.   Patient states she has gained 10 pounds since being in quarantine.  Patient denies any numbness, tingling or burning sensations in her feet or hands. -Hemoglobin A1c measured today, 6.0 -Patient advised to continue exercise & dietary control -Patient to continue metformin 500 mg daily  Diabetic foot exam completed today in office.  Patient has sensation in all point areas of foot exam.  Patient has pes planus of bilateral feet.   Osteoarthritis Patient presenting today for follow-up of pain management for bilateral osteoarthritis of her knees.  Patient states the right knee is sore today due to the rain.  Patient has continued to take Tylenol, apply heating pad, and exercise by walking.  Patient states that her pain is well controlled. -Patient encouraged to continue Tylenol and other nonpharmacological pain management regimen -Patient offered physical therapy, patient denies interest at this time  Healthcare maintenance Patient reports recent colonoscopy in December 2019.  Patient reports that she will bring information to the office to be put on file.  Per patient on colonoscopy she was found to have diverticulosis.    Return to care as needed.   Stark Klein, MD  Family Medicine Teaching Service

## 2019-05-23 NOTE — Assessment & Plan Note (Signed)
Patient presenting today for follow-up of pain management for bilateral osteoarthritis of her knees.  Patient states the right knee is sore today due to the rain.  Patient has continued to take Tylenol, apply heating pad, and exercise by walking.  Patient states that her pain is well controlled. -Patient encouraged to continue Tylenol and other nonpharmacological pain management regimen -Patient offered physical therapy, patient denies interest at this time

## 2019-05-24 LAB — LIPID PANEL
Chol/HDL Ratio: 2.5 ratio (ref 0.0–4.4)
Cholesterol, Total: 141 mg/dL (ref 100–199)
HDL: 56 mg/dL (ref >39)
LDL Calculated: 66 mg/dL (ref 0–99)
Triglycerides: 94 mg/dL (ref 0–149)
VLDL Cholesterol Cal: 19 mg/dL (ref 5–40)

## 2019-05-24 LAB — CBC
Hematocrit: 39.1 % (ref 34.0–46.6)
Hemoglobin: 13.6 g/dL (ref 11.1–15.9)
MCH: 32 pg (ref 26.6–33.0)
MCHC: 34.8 g/dL (ref 31.5–35.7)
MCV: 92 fL (ref 79–97)
Platelets: 301 10*3/uL (ref 150–450)
RBC: 4.25 x10E6/uL (ref 3.77–5.28)
RDW: 13.7 % (ref 11.7–15.4)
WBC: 4.9 10*3/uL (ref 3.4–10.8)

## 2019-05-24 LAB — COMPREHENSIVE METABOLIC PANEL
ALT: 25 IU/L (ref 0–32)
AST: 33 IU/L (ref 0–40)
Albumin/Globulin Ratio: 2 (ref 1.2–2.2)
Albumin: 4.9 g/dL — ABNORMAL HIGH (ref 3.8–4.8)
Alkaline Phosphatase: 84 IU/L (ref 39–117)
BUN/Creatinine Ratio: 19 (ref 12–28)
BUN: 13 mg/dL (ref 8–27)
Bilirubin Total: 0.4 mg/dL (ref 0.0–1.2)
CO2: 22 mmol/L (ref 20–29)
Calcium: 10.1 mg/dL (ref 8.7–10.3)
Chloride: 103 mmol/L (ref 96–106)
Creatinine, Ser: 0.69 mg/dL (ref 0.57–1.00)
GFR calc Af Amer: 105 mL/min/{1.73_m2} (ref >59)
GFR calc non Af Amer: 91 mL/min/{1.73_m2} (ref >59)
Globulin, Total: 2.4 g/dL (ref 1.5–4.5)
Glucose: 104 mg/dL — ABNORMAL HIGH (ref 65–99)
Potassium: 4.2 mmol/L (ref 3.5–5.2)
Sodium: 141 mmol/L (ref 134–144)
Total Protein: 7.3 g/dL (ref 6.0–8.5)

## 2019-05-24 NOTE — Progress Notes (Signed)
Reviewed and called, notified patient of results.

## 2019-05-26 ENCOUNTER — Telehealth: Payer: Self-pay | Admitting: *Deleted

## 2019-05-26 NOTE — Telephone Encounter (Signed)
Call placed to pt re: appt 06/30/2019 with Melina Copa.  Left a detailed message we needed to change it to 9/22 to the same time, 8:00, but also need to change to virtual instead of in office. Asked pt to call back.

## 2019-06-09 DIAGNOSIS — H5203 Hypermetropia, bilateral: Secondary | ICD-10-CM | POA: Diagnosis not present

## 2019-06-09 DIAGNOSIS — E119 Type 2 diabetes mellitus without complications: Secondary | ICD-10-CM | POA: Diagnosis not present

## 2019-06-09 LAB — HM DIABETES EYE EXAM

## 2019-06-21 ENCOUNTER — Other Ambulatory Visit: Payer: Self-pay | Admitting: Cardiology

## 2019-06-21 DIAGNOSIS — I10 Essential (primary) hypertension: Secondary | ICD-10-CM

## 2019-06-21 DIAGNOSIS — E785 Hyperlipidemia, unspecified: Secondary | ICD-10-CM

## 2019-06-29 ENCOUNTER — Other Ambulatory Visit: Payer: Self-pay | Admitting: Cardiology

## 2019-06-29 DIAGNOSIS — E785 Hyperlipidemia, unspecified: Secondary | ICD-10-CM

## 2019-06-29 DIAGNOSIS — I10 Essential (primary) hypertension: Secondary | ICD-10-CM

## 2019-06-30 ENCOUNTER — Encounter: Payer: Self-pay | Admitting: Physician Assistant

## 2019-06-30 ENCOUNTER — Ambulatory Visit: Payer: Medicare Other | Admitting: Physician Assistant

## 2019-06-30 NOTE — Progress Notes (Signed)
Cardiology Office Note    Date:  07/03/2019   ID:  Terri Estrada, DOB 08-Apr-1951, MRN [id number]  PCP:  Stark Klein, MD  Cardiologist:  Ena Dawley, MD  Electrophysiologist:  None   Chief Complaint: 1 year f/u CAD  History of Present Illness:   Terri Estrada is a 68 y.o. female with history of CAD (MI 2001 s/p PTCA/stenting to mid-distal junction of RCA), mild pulm HTN by echo 01/2016, essential HTN, HLD, sickle cell trait, DM, obesity, arthritis, diverticulosis who presents for routine f/u.   She was remotely followed by Dr. Verl Blalock and more recently Dr. Meda Coffee. At time of MI in 2001, EF was 40-45% and cath otherwise showed 20-30% mLAD, 60-70% mid LCx, 80-85% OM1, 100% RCA. She underwent PCI to the RCA. 2D echo 01/2016 showed EF 60-65%, grade 1 DD, moderate LAE, mild RAE, mild TR/PR, mildly increased PASP. Last labs 05/2019 showed normal CBC, LDL 66, K 4.2, Cr 0.69, LFTs ok. Metoprolol previously stopped due to sinus bradycardia (HR upper 40s at that time).  She has done very well over the last year physically without CP, SOB, palpitations, edema, orthopnea, dizziness or syncope. Her BP is mildly elevated today but she suspects due to rushing + mask. It runs 120s/80s at home and has typically been in the 120s/70s in office visits earlier this year. She does report long history of episodic leg pain she thinks is related to her shoes as going barefoot feels much better. It happens at random, always at rest, usually at the end of a period of long standing. She does not have any claudication with activity. Her daughter died of complications from sickle cell last year so it's been tough emotionally but she is working through it with therapy and staying active with family.  Past Medical History:  Diagnosis Date  . Arthritis   . CAD in native artery    a. MI 2001 s/p PTCA/stenting to mid-distal junction of RCA - residual dx treated medically.  . Diverticulosis of colon (without mention of  hemorrhage)   . Essential hypertension, benign   . MI (myocardial infarction) ([place])   . Mild pulmonary hypertension ([place])   . Obesity, unspecified   . Onychia and paronychia of toe   . Osteoarthrosis, unspecified whether generalized or localized, unspecified site   . Other and unspecified hyperlipidemia   . Sickle-cell trait ([place])   . Sinus bradycardia    a. HR 40s even on low dose metoprolol - BB stopped with resolution.  . Streptococcal sore throat   . Type II or unspecified type diabetes mellitus without mention of complication, not stated as uncontrolled     Past Surgical History:  Procedure Laterality Date  . ANGIOPLASTY  09/21/00    Current Medications: Current Meds  Medication Sig  . amLODipine (NORVASC) 10 MG tablet TAKE ONE TABLET BY MOUTH DAILY  . aspirin EC 81 MG tablet Take 1 tablet (81 mg total) by mouth daily.  Marland Kitchen atorvastatin (LIPITOR) 80 MG tablet Take 1 tablet (80 mg total) by mouth daily.  . Calcium Carbonate (CALCIUM 600) 1500 MG TABS Take 600 mg by mouth 2 (two) times daily.   . hydrochlorothiazide (HYDRODIURIL) 25 MG tablet Take 1 tablet (25 mg total) by mouth daily.  . isosorbide mononitrate (IMDUR) 30 MG 24 hr tablet TAKE 1 TABLET (30 MG TOTAL) BY MOUTH DAILY.  Marland Kitchen losartan (COZAAR) 25 MG tablet Take 1 tablet (25 mg total) by mouth daily.  . Multiple Vitamins-Minerals (MULTIVITAMINS  THER. W/MINERALS) TABS Take 1 tablet by mouth daily.    . Omega-3 Fatty Acids (FISH OIL) 1200 MG CAPS Take 1 capsule by mouth 2 (two) times daily.    . traMADol (ULTRAM) 50 MG tablet Take 1 tablet (50 mg total) by mouth every 8 (eight) hours as needed.     Allergies:   Ace inhibitors and Zocor [simvastatin]   Social History   Socioeconomic History  . Marital status: Married    Spouse name: Not on file  . Number of children: 2  . Years of education: Not on file  . Highest education level: Not on file  Occupational History  . Occupation: DAY Associate Professor: SELF-EMPLOYED   Social Needs  . Financial resource strain: Not on file  . Food insecurity    Worry: Not on file    Inability: Not on file  . Transportation needs    Medical: Not on file    Non-medical: Not on file  Tobacco Use  . Smoking status: Former Games developer  . Smokeless tobacco: Never Used  . Tobacco comment: quit nov 2001  Substance and Sexual Activity  . Alcohol use: No  . Drug use: No  . Sexual activity: Yes    Birth control/protection: Post-menopausal  Lifestyle  . Physical activity    Days per week: Not on file    Minutes per session: Not on file  . Stress: Not on file  Relationships  . Social Musician on phone: Not on file    Gets together: Not on file    Attends religious service: Not on file    Active member of club or organization: Not on file    Attends meetings of clubs or organizations: Not on file    Relationship status: Not on file  Other Topics Concern  . Not on file  Social History Narrative   .soc     Family History:  The patient's family history includes Breast cancer in her sister; Diabetes type II in her mother and sister; Hypertension in her mother; Sickle cell anemia in her daughter.  ROS:   Please see the history of present illness. Pandemic has caused some weight gain. All other systems are reviewed and otherwise negative.    EKGs/Labs/Other Studies Reviewed:    Studies reviewed were summarized above.   EKG:  EKG is ordered today, personally reviewed, demonstrating NSR 75bpm, nonspecific STT changes, biatrial enlargement, prior inferior infarct, no acute changes  Recent Labs: 05/23/2019: ALT 25; BUN 13; Creatinine, Ser 0.69; Hemoglobin 13.6; Platelets 301; Potassium 4.2; Sodium 141  Recent Lipid Panel    Component Value Date/Time   CHOL 141 05/23/2019 1457   TRIG 94 05/23/2019 1457   HDL 56 05/23/2019 1457   CHOLHDL 2.5 05/23/2019 1457   CHOLHDL 2.4 03/26/2015 1033   VLDL 14 03/26/2015 1033   LDLCALC 66 05/23/2019 1457   LDLDIRECT 63  03/15/2012 1520    PHYSICAL EXAM:    VS:  BP 140/80   Pulse 75   Ht 5\' 2"  (1.575 m)   Wt 198 lb (89.8 kg)   SpO2 98%   BMI 36.21 kg/m   BMI: Body mass index is 36.21 kg/m.  GEN: Well nourished, well developed AAF, in no acute distress HEENT: normocephalic, atraumatic Neck: no JVD, carotid bruits, or masses Cardiac: RRR; very soft SEM, no rubs or gallops, no edema. 2+ excellent equal distal pedal pulses Respiratory:  clear to auscultation bilaterally, normal work of breathing GI:  soft, nontender, nondistended, + BS MS: no deformity or atrophy Skin: warm and dry, no rash Neuro:  Alert and Oriented x 3, Strength and sensation are intact, follows commands Psych: euthymic mood, full affect  Wt Readings from Last 3 Encounters:  07/03/19 198 lb (89.8 kg)  05/23/19 193 lb 4 oz (87.7 kg)  02/24/19 188 lb (85.3 kg)     ASSESSMENT & PLAN:   1. CAD - continue ASA, statin, Imdur. She is not on BB due to bradycardia. Clinically she is doing well. 2. Mild pulmonary hypertension - due for repeat echocardiogram, also has soft systolic murmur on exam. Will arrange echo. No symptoms at this time.  3. Essential HTN - mildly elevated in clinic but she reports excellent control at home. The patient was instructed to monitor their blood pressure at home and to call if tending to run higher than 130/80 at which time I'd suggest titrating losartan. 4. Hyperlipidemia - recent LDL well controlled on statin. 5. Leg pain - atypical, pulses are excellent, no evidence of vascular insufficiency on exam. She believes it's due to her shoes. There is a possibility she could have some leg aches related to her statin but the description is atypical. I told her if the discomfort persists despite adjusting her shoe wear to let us know and we can try a trial of lower dose statin.  Disposition: F/u with Dr. Delton See in 1 year.  Medication Adjustments/Labs and Tests Ordered: Current medicines are reviewed at length  with the patient today.  Concerns regarding medicines are outlined above. Medication changes, Labs and Tests ordered today are summarized above and listed in the Patient Instructions accessible in Encounters.   Signed, Laurann Montana, PA-C  07/03/2019 9:19 AM    [facility] [address] [place], [place], [place]  [address] Phone: [phone]; Fax: [phone]

## 2019-07-01 ENCOUNTER — Telehealth: Payer: Medicare Other | Admitting: Physician Assistant

## 2019-07-03 ENCOUNTER — Encounter: Payer: Self-pay | Admitting: Physician Assistant

## 2019-07-03 ENCOUNTER — Other Ambulatory Visit: Payer: Self-pay

## 2019-07-03 ENCOUNTER — Ambulatory Visit (INDEPENDENT_AMBULATORY_CARE_PROVIDER_SITE_OTHER): Payer: Medicare Other | Admitting: Physician Assistant

## 2019-07-03 VITALS — BP 140/80 | HR 75 | Ht 62.0 in | Wt 198.0 lb

## 2019-07-03 DIAGNOSIS — I272 Pulmonary hypertension, unspecified: Secondary | ICD-10-CM | POA: Diagnosis not present

## 2019-07-03 DIAGNOSIS — R011 Cardiac murmur, unspecified: Secondary | ICD-10-CM | POA: Diagnosis not present

## 2019-07-03 DIAGNOSIS — M79605 Pain in left leg: Secondary | ICD-10-CM

## 2019-07-03 DIAGNOSIS — M79604 Pain in right leg: Secondary | ICD-10-CM

## 2019-07-03 DIAGNOSIS — I2510 Atherosclerotic heart disease of native coronary artery without angina pectoris: Secondary | ICD-10-CM | POA: Diagnosis not present

## 2019-07-03 DIAGNOSIS — E785 Hyperlipidemia, unspecified: Secondary | ICD-10-CM | POA: Diagnosis not present

## 2019-07-03 DIAGNOSIS — I10 Essential (primary) hypertension: Secondary | ICD-10-CM | POA: Diagnosis not present

## 2019-07-03 NOTE — Patient Instructions (Addendum)
Medication Instructions:  Your physician recommends that you continue on your current medications as directed. Please refer to the Current Medication list given to you today.  If you need a refill on your cardiac medications before your next appointment, please call your pharmacy.   Lab work: None ordered  If you have labs (blood work) drawn today and your tests are completely normal, you will receive your results only by: Marland Kitchen MyChart Message (if you have MyChart) OR . A paper copy in the mail If you have any lab test that is abnormal or we need to change your treatment, we will call you to review the results.  Testing/Procedures: Your physician has requested that you have an echocardiogram. Echocardiography is a painless test that uses sound waves to create images of your heart. It provides your doctor with information about the size and shape of your heart and how well your heart's chambers and valves are working. This procedure takes approximately one hour. There are no restrictions for this procedure.    Follow-Up: At [facility], you and your health needs are our priority.  As part of our continuing mission to provide you with exceptional heart care, we have created designated Provider Care Teams.  These Care Teams include your primary Cardiologist (physician) and Advanced Practice Providers (APPs -  Physician Assistants and Nurse Practitioners) who all work together to provide you with the care you need, when you need it. You will need a follow up appointment in 12 months.  Please call our office 2 months in advance to schedule this appointment.  You may see Tobias Alexander, MD or one of the following Advanced Practice Providers on your designated Care Team:   [place], PA-C Ronie Spies, PA-C . Jacolyn Reedy, PA-C  Any Other Special Instructions Will Be Listed Below (If Applicable).  Please monitor your blood pressure occasionally at home. Call us if you tend to get readings  of greater than 130 on the top number or 80 on the bottom number.  Please let us know if your leg pain does not improve with changing your shoes.    Echocardiogram An echocardiogram is a procedure that uses painless sound waves (ultrasound) to produce an image of the heart. Images from an echocardiogram can provide important information about:  Signs of coronary artery disease (CAD).  Aneurysm detection. An aneurysm is a weak or damaged part of an artery wall that bulges out from the normal force of blood pumping through the body.  Heart size and shape. Changes in the size or shape of the heart can be associated with certain conditions, including heart failure, aneurysm, and CAD.  Heart muscle function.  Heart valve function.  Signs of a past heart attack.  Fluid buildup around the heart.  Thickening of the heart muscle.  A tumor or infectious growth around the heart valves. Tell a health care provider about:  Any allergies you have.  All medicines you are taking, including vitamins, herbs, eye drops, creams, and over-the-counter medicines.  Any blood disorders you have.  Any surgeries you have had.  Any medical conditions you have.  Whether you are pregnant or may be pregnant. What are the risks? Generally, this is a safe procedure. However, problems may occur, including:  Allergic reaction to dye (contrast) that may be used during the procedure. What happens before the procedure? No specific preparation is needed. You may eat and drink normally. What happens during the procedure?   An IV tube may be inserted  into one of your veins.  You may receive contrast through this tube. A contrast is an injection that improves the quality of the pictures from your heart.  A gel will be applied to your chest.  A wand-like tool (transducer) will be moved over your chest. The gel will help to transmit the sound waves from the transducer.  The sound waves will harmlessly  bounce off of your heart to allow the heart images to be captured in real-time motion. The images will be recorded on a computer. The procedure may vary among health care providers and hospitals. What happens after the procedure?  You may return to your normal, everyday life, including diet, activities, and medicines, unless your health care provider tells you not to do that. Summary  An echocardiogram is a procedure that uses painless sound waves (ultrasound) to produce an image of the heart.  Images from an echocardiogram can provide important information about the size and shape of your heart, heart muscle function, heart valve function, and fluid buildup around your heart.  You do not need to do anything to prepare before this procedure. You may eat and drink normally.  After the echocardiogram is completed, you may return to your normal, everyday life, unless your health care provider tells you not to do that. This information is not intended to replace advice given to you by your health care provider. Make sure you discuss any questions you have with your health care provider. Document Released: 09/22/2000 Document Revised: 01/16/2019 Document Reviewed: 10/28/2016 Elsevier Patient Education  2020 [employer].

## 2019-07-07 ENCOUNTER — Other Ambulatory Visit: Payer: Self-pay

## 2019-07-07 ENCOUNTER — Ambulatory Visit (HOSPITAL_COMMUNITY): Payer: Medicare Other | Attending: Internal Medicine

## 2019-07-07 DIAGNOSIS — R011 Cardiac murmur, unspecified: Secondary | ICD-10-CM | POA: Diagnosis not present

## 2019-07-07 DIAGNOSIS — I272 Pulmonary hypertension, unspecified: Secondary | ICD-10-CM

## 2019-07-07 NOTE — Progress Notes (Signed)
2D Echo has been performed. [facility] Tamana Hatfield RDCS 07/07/2019 9:01am

## 2019-07-16 ENCOUNTER — Other Ambulatory Visit: Payer: Self-pay | Admitting: Cardiology

## 2019-07-16 DIAGNOSIS — N8111 Cystocele, midline: Secondary | ICD-10-CM | POA: Diagnosis not present

## 2019-07-16 DIAGNOSIS — E785 Hyperlipidemia, unspecified: Secondary | ICD-10-CM

## 2019-07-16 DIAGNOSIS — I10 Essential (primary) hypertension: Secondary | ICD-10-CM

## 2019-07-17 ENCOUNTER — Encounter: Payer: Self-pay | Admitting: Family Medicine

## 2019-07-26 ENCOUNTER — Other Ambulatory Visit: Payer: Self-pay | Admitting: Cardiology

## 2019-07-26 DIAGNOSIS — I10 Essential (primary) hypertension: Secondary | ICD-10-CM

## 2019-07-26 DIAGNOSIS — E785 Hyperlipidemia, unspecified: Secondary | ICD-10-CM

## 2019-08-25 ENCOUNTER — Other Ambulatory Visit: Payer: Self-pay

## 2019-08-25 ENCOUNTER — Ambulatory Visit (INDEPENDENT_AMBULATORY_CARE_PROVIDER_SITE_OTHER): Payer: Medicare Other | Admitting: Podiatry

## 2019-08-25 DIAGNOSIS — Z794 Long term (current) use of insulin: Secondary | ICD-10-CM | POA: Diagnosis not present

## 2019-08-25 DIAGNOSIS — E119 Type 2 diabetes mellitus without complications: Secondary | ICD-10-CM | POA: Diagnosis not present

## 2019-08-25 DIAGNOSIS — M205X9 Other deformities of toe(s) (acquired), unspecified foot: Secondary | ICD-10-CM

## 2019-08-25 DIAGNOSIS — M79674 Pain in right toe(s): Secondary | ICD-10-CM | POA: Diagnosis not present

## 2019-08-25 DIAGNOSIS — I739 Peripheral vascular disease, unspecified: Secondary | ICD-10-CM

## 2019-08-27 NOTE — Progress Notes (Signed)
Subjective: 68 year old female presents the office today for yearly diabetic exam.  She says overall she has been doing well however over the last several months she will get occasional numbness to her right fourth toe.  She states that is worse in the morning but after she starts moving the subsides.  No recent injury she denies any swelling.  No radiating pain.  She has no treatment.  No other concerns. Denies any systemic complaints such as fevers, chills, nausea, vomiting. No acute changes since last appointment, and no other complaints at this time.   Last A1c 6  Objective: AAO x3, NAD DP/PT pulses palpable bilaterally, CRT less than 3 seconds Adductovarus present of the lesser digits most notably on the right side the third, fourth, fifth toes.  There is no area tenderness there is no palpable neuroma.  There is no pain with the toe today there is no numbness or tingling.  No open lesions or pre-ulcerative lesions.  No pain with calf compression, swelling, warmth, erythema  Assessment: Adductovarus likely resulting in right fourth toe discomfort, numbness  Plan: -All treatment options discussed with the patient including all alternatives, risks, complications.  -I did a screening ABI today which was normal.  Left was 1.32 right was 1.24.  Dispensed offloading pads with the right fourth toe.  Currently she is doing well from a diabetes standpoint.  Discussed importance of daily foot inspection of the medial there is any issues. -Patient encouraged to call the office with any questions, concerns, change in symptoms.    Return in about 1 year (around 08/24/2020) for diabetic foot exam.  Trula Slade DPM

## 2019-09-01 NOTE — Addendum Note (Signed)
Addended by: Sheelah Ritacco R on: 09/01/2019 10:59 AM   Modules accepted: Orders  

## 2019-09-02 ENCOUNTER — Encounter: Payer: Self-pay | Admitting: Family Medicine

## 2019-09-02 ENCOUNTER — Other Ambulatory Visit: Payer: Self-pay

## 2019-09-02 ENCOUNTER — Ambulatory Visit (INDEPENDENT_AMBULATORY_CARE_PROVIDER_SITE_OTHER): Payer: Medicare Other | Admitting: Family Medicine

## 2019-09-02 VITALS — BP 126/80 | HR 71 | Wt 190.8 lb

## 2019-09-02 DIAGNOSIS — E119 Type 2 diabetes mellitus without complications: Secondary | ICD-10-CM | POA: Diagnosis not present

## 2019-09-02 DIAGNOSIS — M25561 Pain in right knee: Secondary | ICD-10-CM

## 2019-09-02 DIAGNOSIS — M1711 Unilateral primary osteoarthritis, right knee: Secondary | ICD-10-CM | POA: Diagnosis not present

## 2019-09-02 LAB — POCT GLYCOSYLATED HEMOGLOBIN (HGB A1C): HbA1c, POC (controlled diabetic range): 5.9 % (ref 0.0–7.0)

## 2019-09-02 NOTE — Progress Notes (Signed)
Within goal range, reviewed with patient at this visit. Will recheck in 3 months.

## 2019-09-02 NOTE — Progress Notes (Signed)
Patient Name: Terri Estrada Date of Birth: 1951-10-05 Date of Visit: 09/03/19 PCP: Stark Klein, MD  Chief Complaint: follow up for hemoglobin A1c, right knee pain   Subjective: Terri Estrada is a 68 y.o. with medical history significant for knee osteoarthritis  presenting today for A1c check and follow up for knee pain.   DM, A1c Check  Terri Estrada states that she has increased her walks from 3 to 4 days per week. She reports walking a minimum of 45 minutes around the track with her exercising partner. Patient reports that she would like to do whatever it takes to avoid being on oral diabetes medications. I applauded her commitment to lifestyle changes and taking charge of her health despite current circumstances.   Knee Pain  Terri Estrada reports right knee pain, she clarifies she has no issues with her left knee at this time. She reports a history of osteoarthritis and uses Tylenol arthritis on days where she has severe pain, taking 2 pills twice per day occasionally. Patient is very conscientious about the amount of medications she takes as she does not want to damage internal organs. When asked about alternating Tylenol Arthritis with NSAIDS, the patient replies that she had a MI years ago and does not take NSAIDs because of this. She asked for a referral to an orthopedic physician and added that she has tried steroid joint injections in the past with no success after 2 trials. [name] adds that warm packs and ice packs help to take the edge off of her knee pain that she describes as "throbbing". Patient denies any numbness or feeling that her knee gives out while she is ambulating. She also denies any recent trauma to the knee.    I have reviewed the patient's medical, surgical, family, and social history as appropriate.  Vitals:   09/02/19 1354  BP: 126/80  Pulse: 71  SpO2: 99%    Physical Exam:   General: Alert and cooperative and appears to be in no acute distress  Cardio: Normal S1 and S2, no S3 or S4. Rhythm is regular. No murmurs or rubs.   Pulm: Clear to auscultation bilaterally, no crackles, wheezing, or diminished breath sounds. Normal respiratory effort Abdomen: Bowel sounds normal. Abdomen soft and non-tender.  Extremities: Warm/ well perfused bilateral lower extremities, no erythema or edema associated with knee, patient has normal ROM bilaterally on both knees, no meniscal or ACL laxity demonstrated on exam, joints are not warmth to touch  Neuro: patient is alert and oriented x3, patient ambulates independently in clinic   Assessment & Plan:   Diabetes mellitus type 2, controlled, without complications Patient continues to exercise for 45 mins, now 4 times per week at a local track. Patient's hemoglobin A1c remains within goal range.  -encouraged patient to continue with lifestyle management of her diabetes  -complimented patient on her commitment to her health   Osteoarthritis Patient states that she is continuing to experience pain with her right knee despite using Tylenol arthritis. Patient reports that she gets relief from throbbing pain with warm compresses and ice packs.  -referral to orthopedics placed  -encouraged patient to continue with exercises  -patient unable to take NSAIDS as she has had MI in the past -has tried joint injections and would not like to try this again as it did not help her pain     Return to care 3 months to follow up with hemoglobin A1c for diabetes.   Stark Klein, MD  Family Medicine  PGY1

## 2019-09-02 NOTE — Patient Instructions (Signed)
It was a pleasure to see you today! Thank you for choosing [facility] for your primary care. Terri Estrada was seen for follow up for diabetes and osteoarthritis.   Our plans for today were:  Continue your heating pad and ice packs for pain.   You may want to regularly take Tylenol Arthritis on a schedule in order to try and get ahead of your knee pain. You could also try using NSAIDS such as Naproxen, Ibuprofen for example when you experience breakthrough pain.   Referral for orthopedics. You should receive a call within the next few weeks in order to schedule this appointment. Please notify this office if you do not hear anything in the next 2 weeks.    You should return to our clinic to see Dr. Rosita Fire in 3 months in order to follow up with your pain or earlier if needed.   Best,  Dr. Rosita Fire

## 2019-09-03 NOTE — Assessment & Plan Note (Signed)
Patient continues to exercise for 45 mins, now 4 times per week at a local track. Patient's hemoglobin A1c remains within goal range.  -encouraged patient to continue with lifestyle management of her diabetes  -complimented patient on her commitment to her health

## 2019-09-03 NOTE — Assessment & Plan Note (Signed)
Patient states that she is continuing to experience pain with her right knee despite using Tylenol arthritis. Patient reports that she gets relief from throbbing pain with warm compresses and ice packs.  -referral to orthopedics placed  -encouraged patient to continue with exercises  -patient unable to take NSAIDS as she has had MI in the past -has tried joint injections and would not like to try this again as it did not help her pain

## 2019-09-16 ENCOUNTER — Encounter: Payer: Self-pay | Admitting: Orthopaedic Surgery

## 2019-09-16 ENCOUNTER — Ambulatory Visit (INDEPENDENT_AMBULATORY_CARE_PROVIDER_SITE_OTHER): Payer: Medicare Other | Admitting: Orthopaedic Surgery

## 2019-09-16 ENCOUNTER — Ambulatory Visit: Payer: Self-pay

## 2019-09-16 ENCOUNTER — Other Ambulatory Visit: Payer: Self-pay

## 2019-09-16 DIAGNOSIS — M25561 Pain in right knee: Secondary | ICD-10-CM | POA: Diagnosis not present

## 2019-09-16 DIAGNOSIS — G8929 Other chronic pain: Secondary | ICD-10-CM | POA: Diagnosis not present

## 2019-09-16 NOTE — Progress Notes (Signed)
Office Visit Note   Patient: Terri Estrada           Date of Birth: 1950-12-03           MRN: [id number] Visit Date: 09/16/2019              Requested by: McDiarmid, Blane Ohara, MD [address] [place],  [place] [address] PCP: Terri Klein, MD   Assessment & Plan: Visit Diagnoses:  1. Chronic pain of right knee     Plan: Impression is end-stage right knee DJD.  We talked about a cortisone injection which she declined.  We also briefly discussed viscosupplementation.  I have recommended Voltaren gel over-the-counter to try.  Finally we discussed total knee replacement which she will think about and let us know.  Questions encouraged and answered.  Follow-up as needed.  Follow-Up Instructions: Return if symptoms worsen or fail to improve.   Orders:  Orders Placed This Encounter  Procedures  . XR KNEE 3 VIEW RIGHT   No orders of the defined types were placed in this encounter.     Procedures: No procedures performed   Clinical Data: No additional findings.   Subjective: Chief Complaint  Patient presents with  . Right Knee - Pain    Terri Estrada is a very pleasant 68 year old female comes in for evaluation of chronic right knee pain for many years.  She states that the pain is worse with activity and standing.  Denies any mechanical symptoms.  She will occasionally use a cane for ambulation.  She has aching pain that is worse at night.  She previously had a cortisone injection in the left knee which did not work for more than a couple of days and was very painful.  She denies any swelling or numbness and tingling or constitutional symptoms.   Review of Systems  Constitutional: Negative.   HENT: Negative.   Eyes: Negative.   Respiratory: Negative.   Cardiovascular: Negative.   Endocrine: Negative.   Musculoskeletal: Negative.   Neurological: Negative.   Hematological: Negative.   Psychiatric/Behavioral: Negative.   All other systems reviewed and are negative.     Objective: Vital Signs: There were no vitals taken for this visit.  Physical Exam Vitals signs and nursing note reviewed.  Constitutional:      Appearance: She is well-developed.  HENT:     Head: Normocephalic and atraumatic.  Neck:     Musculoskeletal: Neck supple.  Pulmonary:     Effort: Pulmonary effort is normal.  Abdominal:     Palpations: Abdomen is soft.  Skin:    General: Skin is warm.     Capillary Refill: Capillary refill takes less than 2 seconds.  Neurological:     Mental Status: She is alert and oriented to person, place, and time.  Psychiatric:        Behavior: Behavior normal.        Thought Content: Thought content normal.        Judgment: Judgment normal.     Ortho Exam Right knee exam shows no joint effusion.  Collaterals and cruciates are stable.  Normal range of motion with moderate pain. Specialty Comments:  No specialty comments available.  Imaging: Xr Knee 3 View Right  Result Date: 09/16/2019 End-stage DJD with varus deformity.    PMFS History: Patient Active Problem List   Diagnosis Date Noted  . Murmur 12/10/2015  . Pes planus of both feet 01/07/2015  . Depression 01/07/2015  . Female cystocele 06/26/2014  .  Healthcare maintenance 12/17/2012  . Osteoarthritis 06/21/2010  . Hyperlipidemia 05/31/2009  . DIVERTICULOSIS, COLON 10/16/2008  . AORTIC STENOSIS, MILD 06/02/2008  . Diabetes mellitus type 2, controlled, without complications (HCC) 12/06/2006  . Obesity (BMI 30-39.9) 12/06/2006  . SICKLE CELL TRAIT 12/06/2006  . HYPERTENSION, BENIGN SYSTEMIC 12/06/2006  . CORONARY, ARTERIOSCLEROSIS 12/06/2006   Past Medical History:  Diagnosis Date  . Arthritis   . CAD in native artery    a. MI 2001 s/p PTCA/stenting to mid-distal junction of RCA - residual dx treated medically.  . Diverticulosis of colon (without mention of hemorrhage)   . Essential hypertension, benign   . MI (myocardial infarction) (HCC)   . Mild pulmonary  hypertension (HCC)   . Obesity, unspecified   . Onychia and paronychia of toe   . Osteoarthrosis, unspecified whether generalized or localized, unspecified site   . Other and unspecified hyperlipidemia   . Sickle-cell trait (HCC)   . Sinus bradycardia    a. HR 40s even on low dose metoprolol - BB stopped with resolution.  . Streptococcal sore throat   . Type II or unspecified type diabetes mellitus without mention of complication, not stated as uncontrolled     Family History  Problem Relation Age of Onset  . Sickle cell anemia Daughter   . Hypertension Mother   . Diabetes type II Mother   . Diabetes type II Sister   . Breast cancer Sister     Past Surgical History:  Procedure Laterality Date  . ANGIOPLASTY  09/21/00   Social History   Occupational History  . Occupation: DAY Associate Professor: SELF-EMPLOYED  Tobacco Use  . Smoking status: Former Games developer  . Smokeless tobacco: Never Used  . Tobacco comment: quit nov 2001  Substance and Sexual Activity  . Alcohol use: No  . Drug use: No  . Sexual activity: Yes    Birth control/protection: Post-menopausal

## 2019-09-19 ENCOUNTER — Other Ambulatory Visit: Payer: Self-pay | Admitting: Family Medicine

## 2019-09-19 DIAGNOSIS — Z1231 Encounter for screening mammogram for malignant neoplasm of breast: Secondary | ICD-10-CM

## 2019-09-22 ENCOUNTER — Telehealth: Payer: Self-pay | Admitting: Orthopaedic Surgery

## 2019-09-22 NOTE — Telephone Encounter (Signed)
Patient called triage line this afternoon. She wants to proceed with right total knee arthroplasty. Her call back number for scheduling is [phone]. Thanks!

## 2019-09-22 NOTE — Telephone Encounter (Signed)
Ok please get PCP and cardiac clearance.  Please also let her know that we need to get clearances first.  Thanks.

## 2019-09-24 ENCOUNTER — Other Ambulatory Visit: Payer: Self-pay | Admitting: Cardiology

## 2019-09-24 DIAGNOSIS — E785 Hyperlipidemia, unspecified: Secondary | ICD-10-CM

## 2019-09-24 DIAGNOSIS — I10 Essential (primary) hypertension: Secondary | ICD-10-CM

## 2019-09-25 NOTE — Telephone Encounter (Signed)
Called patient to advise. She is aware.  

## 2019-10-07 ENCOUNTER — Telehealth: Payer: Self-pay | Admitting: *Deleted

## 2019-10-07 NOTE — Telephone Encounter (Signed)
   [facility] Pre-operative Risk Assessment    Request for surgical clearance:  1. What type of surgery is being performed? TOTAL KNEE ARTHROPLASTY   2. When is this surgery scheduled? TBD   3. What type of clearance is required (medical clearance vs. Pharmacy clearance to hold med vs. Both)? MEDICAL  4. Are there any medications that need to be held prior to surgery and how long? ASA    5. Practice name and name of physician performing surgery? [facility] [place] [facility]   6. What is your office phone number [phone]    7.   What is your office fax number [phone]  8.   Anesthesia type (None, local, MAC, general) ?  SPINAL vs. GENERAL + BLOCK   Julaine Hua 10/07/2019, 5:11 PM  _________________________________________________________________   (provider comments below)

## 2019-10-08 NOTE — Telephone Encounter (Signed)
   Primary Cardiologist: Ena Dawley, MD  Chart reviewed and patient contacted by phone today as part of pre-operative protocol coverage. Given past medical history and time since last visit, based on ACC/AHA guidelines, LORIANN BOSSERMAN would be at acceptable risk for the planned procedure without further cardiovascular testing.   OK to hold aspirin 3-5 days pre op if needed.  I will route this recommendation to the requesting party via Epic fax function and remove from pre-op pool.  Please call with questions.  Kerin Ransom, PA-C 10/08/2019, 9:29 AM

## 2019-10-21 ENCOUNTER — Telehealth: Payer: Self-pay | Admitting: *Deleted

## 2019-10-21 NOTE — Telephone Encounter (Signed)
-----   Message from Jackelyn Poling, DO sent at 10/08/2019  4:58 PM EST ----- Regarding: Medical clearance for Knee Arthroplasty Hello red team, And watching Makiera's box during her vacation and her patient needs an appointment with one of our providers for medical clearance. Please contact patient to schedule with one of our providers.  Thank you so much for all you do,  Alycia Rossetti

## 2019-10-21 NOTE — Telephone Encounter (Signed)
Pt informed and scheduled. Petra Sargeant, CMA  

## 2019-10-21 NOTE — Progress Notes (Signed)
Subjective:   Chief Complaint  Patient presents with  . knee surgery clearance    right    Terri Estrada  is here for a Pre-operative physical at the request of orthopedics She  is having bilateral total knee replacement, right first, surgery on has not been scheduled yet.  Personal or family hx of adverse outcome to anesthesia? No  Chipped, cracked, missing, or loose teeth? Dentures in top, missing back teeth on the bottom Decreased ROM of neck? No  Able to walk up 2 flights of stairs without becoming significantly short of breath or having chest pain? Yes   Revised Goldman Criteria: High Risk Surgery (intraperitoneal, intrathoracic, aortic): No  Ischemic heart disease (Prior MI, +excercise stress test, angina, nitrate use, Qwave): Yes ; MI in 2001 History of heart failure: No  History of cerebrovascular disease: No  History of diabetes: Yes, diet controlled Insulin therapy for DM: No  Preoperative Cr >2.0: pending  Revised Goldman Criteria - risk for major cardiac death No risk factors -- 0.4 percent One risk factor -- 1.0 percent  Two risk factors -- 2.4 percent  Three or more risk factors -- 5.4 percent   Patient Active Problem List   Diagnosis Date Noted  . Pre-op evaluation 10/23/2019  . Murmur 12/10/2015  . Pes planus of both feet 01/07/2015  . Depression 01/07/2015  . Female cystocele 06/26/2014  . Healthcare maintenance 12/17/2012  . Osteoarthritis 06/21/2010  . Hyperlipidemia 05/31/2009  . DIVERTICULOSIS, COLON 10/16/2008  . AORTIC STENOSIS, MILD 06/02/2008  . Diabetes mellitus type 2, controlled, without complications ([place]) 16/07/9603  . Obesity (BMI 30-39.9) 12/06/2006  . SICKLE CELL TRAIT 12/06/2006  . HYPERTENSION, BENIGN SYSTEMIC 12/06/2006  . CORONARY, ARTERIOSCLEROSIS 12/06/2006   Past Medical History:  Diagnosis Date  . Arthritis   . CAD in native artery    a. MI 2001 s/p PTCA/stenting to mid-distal junction of RCA - residual dx treated medically.  .  Diverticulosis of colon (without mention of hemorrhage)   . Essential hypertension, benign   . MI (myocardial infarction) ([place])   . Mild pulmonary hypertension ([place])   . Obesity, unspecified   . Onychia and paronychia of toe   . Osteoarthrosis, unspecified whether generalized or localized, unspecified site   . Other and unspecified hyperlipidemia   . Sickle-cell trait ([place])   . Sinus bradycardia    a. HR 40s even on low dose metoprolol - BB stopped with resolution.  . Streptococcal sore throat   . Type II or unspecified type diabetes mellitus without mention of complication, not stated as uncontrolled     Past Surgical History:  Procedure Laterality Date  . ANGIOPLASTY  09/21/00    Current Outpatient Medications  Medication Sig Dispense Refill  . amLODipine (NORVASC) 10 MG tablet TAKE ONE TABLET BY MOUTH DAILY 90 tablet 3  . aspirin EC 81 MG tablet Take 1 tablet (81 mg total) by mouth daily. 90 tablet 3  . atorvastatin (LIPITOR) 80 MG tablet TAKE ONE TABLET BY MOUTH DAILY 90 tablet 2  . Calcium Carbonate (CALCIUM 600) 1500 MG TABS Take 600 mg by mouth 2 (two) times daily.     . hydrochlorothiazide (HYDRODIURIL) 25 MG tablet TAKE ONE TABLET BY MOUTH DAILY 90 tablet 2  . isosorbide mononitrate (IMDUR) 30 MG 24 hr tablet TAKE ONE TABLET BY MOUTH DAILY 90 tablet 2  . losartan (COZAAR) 25 MG tablet TAKE ONE TABLET BY MOUTH DAILY 90 tablet 3  . Multiple Vitamins-Minerals (MULTIVITAMINS THER. W/MINERALS) TABS Take  1 tablet by mouth daily.      . Omega-3 Fatty Acids (FISH OIL) 1200 MG CAPS Take 1 capsule by mouth 2 (two) times daily.       No current facility-administered medications for this visit.    Allergies  Allergen Reactions  . Ace Inhibitors     REACTION: cough  . Zocor [Simvastatin] Other (See Comments)    Muscle aches (severe) in legs; has taken Lipitor without any issues    Social History   Socioeconomic History  . Marital status: Married    Spouse name: Not on file  .  Number of children: 2  . Years of education: Not on file  . Highest education level: Not on file  Occupational History  . Occupation: DAY Associate Professor: SELF-EMPLOYED  Tobacco Use  . Smoking status: Former Games developer  . Smokeless tobacco: Never Used  . Tobacco comment: quit nov 2001  Substance and Sexual Activity  . Alcohol use: No  . Drug use: No  . Sexual activity: Yes    Birth control/protection: Post-menopausal  Other Topics Concern  . Not on file  Social History Narrative   .soc   Social Determinants of Health   Financial Resource Strain:   . Difficulty of Paying Living Expenses: Not on file  Food Insecurity:   . Worried About Programme researcher, broadcasting/film/video in the Last Year: Not on file  . Ran Out of Food in the Last Year: Not on file  Transportation Needs:   . Lack of Transportation (Medical): Not on file  . Lack of Transportation (Non-Medical): Not on file  Physical Activity:   . Days of Exercise per Week: Not on file  . Minutes of Exercise per Session: Not on file  Stress:   . Feeling of Stress : Not on file  Social Connections:   . Frequency of Communication with Friends and Family: Not on file  . Frequency of Social Gatherings with Friends and Family: Not on file  . Attends Religious Services: Not on file  . Active Member of Clubs or Organizations: Not on file  . Attends Banker Meetings: Not on file  . Marital Status: Not on file  Intimate Partner Violence:   . Fear of Current or Ex-Partner: Not on file  . Emotionally Abused: Not on file  . Physically Abused: Not on file  . Sexually Abused: Not on file    Family History  Problem Relation Age of Onset  . Sickle cell anemia Daughter   . Hypertension Mother   . Diabetes type II Mother   . Diabetes type II Sister   . Breast cancer Sister      Review of Systems:  Constitutional:  no unexpected change in weight, no weakness, no unexplained fevers, sweats, or chills Eye:  no recent significant change  in vision Ear:  no hearing loss Nose/Mouth/Throat:  No dental complaints Neck/Thyroid:  no lumps or masses Pulmonary:  no chronic cough, sputum, or hemoptysis and no shortness of breath Cardiovascular:  no exercise intolerance, no chest pain Gastrointestinal:  no abdominal pain and no change in bowel habits GU:  negative for dysuria, frequency, and incontinence Musculoskeletal/Extremities:  no peripheral edema Skin/Integumentary ROS:  no abnormal skin lesions reported Neurologic:  no numbness, tingling, or tremor   Objective:   Vitals:   10/23/19 0931  BP: 121/65  Pulse: 64  SpO2: 98%  Weight: 196 lb 6.4 oz (89.1 kg)   Body mass index is 35.92 kg/m.  General:  well developed, well nourished, in no apparent distress Skin:  warm, no pallor or diaphoresis Head:  normocephalic, atraumatic Eyes:  pupils equal and round, sclera anicteric without injection Ears:  canals without lesions, TMs shiny without retraction, no obvious effusion, no erythema Throat/Pharynx:  lips and gingiva without lesion; tongue and uvula midline; non-inflamed pharynx; no exudates or postnasal drainage Neck: neck supple without adenopathy, thyromegaly, or masses, no bruits, no jugular venous distention Lungs:  clear to auscultation, breath sounds equal bilaterally, no respiratory distress Cardio:  regular rate and rhythm without murmurs Abdomen:  abdomen soft, nontender; bowel sounds normal; no masses, hepatomegaly or splenomegaly Musculoskeletal:  symmetrical muscle groups noted without atrophy or deformity Extremities:  no clubbing, cyanosis, or edema, no deformities, no skin discoloration Neuro:  gait normal; deep tendon reflexes normal and symmetric and alert and oriented to person, place, and time Psych: Age appropriate judgment and insight; normal mood   Assessment:   Pre-op evaluation - Plan: CBC, Basic Metabolic Panel, Protime-INR    Plan:   Orders as above. EKG - will defer to  cardiology The above laboratory work was ordered and will be sent with this physical. Pending the above workup, the patient is deemed low risk for the proposed procedure.  Patient presenting for preop clearance.  Patient with 2 risk factors, 1 for MI and second for diabetes.  Diabetes is diet-controlled so she is doing well from that standpoint.  Cardiac clearance will be per cardiology since she sees them for aortic stenosis.  Patient is well controlled and has no lung pathology such as COPD patient is medically cleared for surgery.  Will need cardiac clearance from her cardiologist.  Will defer this to cardiology to improve.  Will obtain CBC, BMP, PT/INR today.  Orthopedic should be able to see in epic but if they are not able to see patient should sign a release of records that we may fax over lab results.  Strict return precautions given.  Follow-up as needed.  The patient voiced understanding and agreement to the plan.  Discussed with Dr. Clista Bernhardt, DO. PGY-3 10/23/19  10:11 AM

## 2019-10-23 ENCOUNTER — Encounter: Payer: Self-pay | Admitting: Family Medicine

## 2019-10-23 ENCOUNTER — Other Ambulatory Visit: Payer: Self-pay

## 2019-10-23 ENCOUNTER — Ambulatory Visit (INDEPENDENT_AMBULATORY_CARE_PROVIDER_SITE_OTHER): Payer: Medicare HMO | Admitting: Family Medicine

## 2019-10-23 VITALS — BP 121/65 | HR 64 | Wt 196.4 lb

## 2019-10-23 DIAGNOSIS — Z01818 Encounter for other preprocedural examination: Secondary | ICD-10-CM | POA: Diagnosis not present

## 2019-10-23 NOTE — Patient Instructions (Signed)
It was a pleasure seeing you today.   Today we discussed your preoperative clearance.  For your diabetes: Since her diabetes is diet controlled you should be cleared from a medical standpoint for surgery.  However, for your cardiac clearance you need to contact your cardiologist.  They will say whether or not it is okay with your aortic stenosis to have the surgery.  They will also states he needs a new biotic.  Please have your orthopedic surgeon resend the paperwork as I did not receive this.  I will obtain blood work today for your preop exam.  If you need [place] to fax this over to your orthopedist just fill out a release of records form.  Bleeding after the surgery make sure you stay as healthy as possible.  Eat a healthy diet, exercise daily, check your sugars to make sure there well-controlled.  Please follow up if you need further preop clearance or sooner if symptoms persist or worsen. Please call the clinic immediately if you have any concerns.   Our clinic's number is [phone]. Please call with questions or concerns.   Please go to the emergency room if chest pain or shortness of breath  Thank you,  Oralia Manis, DO

## 2019-10-23 NOTE — Assessment & Plan Note (Signed)
Patient presenting for preop clearance.  Patient with 2 risk factors, 1 for MI and second for diabetes.  Diabetes is diet-controlled so she is doing well from that standpoint.  Cardiac clearance will be per cardiology since she sees them for aortic stenosis.  Patient is well controlled and has no lung pathology such as COPD patient is medically cleared for surgery.  Will need cardiac clearance from her cardiologist.  Will defer this to cardiology to improve.  Will obtain CBC, BMP, PT/INR today.  Orthopedic should be able to see in epic but if they are not able to see patient should sign a release of records that we may fax over lab results.  Strict return precautions given.  Follow-up as needed.

## 2019-10-24 LAB — CBC
Hematocrit: 36.8 % (ref 34.0–46.6)
Hemoglobin: 12.7 g/dL (ref 11.1–15.9)
MCH: 31.3 pg (ref 26.6–33.0)
MCHC: 34.5 g/dL (ref 31.5–35.7)
MCV: 91 fL (ref 79–97)
Platelets: 323 10*3/uL (ref 150–450)
RBC: 4.06 x10E6/uL (ref 3.77–5.28)
RDW: 13.4 % (ref 11.7–15.4)
WBC: 4.1 10*3/uL (ref 3.4–10.8)

## 2019-10-24 LAB — BASIC METABOLIC PANEL
BUN/Creatinine Ratio: 20 (ref 12–28)
BUN: 14 mg/dL (ref 8–27)
CO2: 24 mmol/L (ref 20–29)
Calcium: 9.8 mg/dL (ref 8.7–10.3)
Chloride: 102 mmol/L (ref 96–106)
Creatinine, Ser: 0.71 mg/dL (ref 0.57–1.00)
GFR calc Af Amer: 102 mL/min/{1.73_m2} (ref >59)
GFR calc non Af Amer: 88 mL/min/{1.73_m2} (ref >59)
Glucose: 111 mg/dL — ABNORMAL HIGH (ref 65–99)
Potassium: 4.4 mmol/L (ref 3.5–5.2)
Sodium: 140 mmol/L (ref 134–144)

## 2019-10-24 LAB — PROTIME-INR
INR: 1 (ref 0.9–1.2)
Prothrombin Time: 11 s (ref 9.1–12.0)

## 2019-11-06 ENCOUNTER — Telehealth: Payer: Self-pay

## 2019-11-06 NOTE — Telephone Encounter (Signed)
I called patient to discuss scheduling TKA.  She is working on helping her daughter move.  She will call me back to schedule for March or April.

## 2019-11-10 ENCOUNTER — Other Ambulatory Visit: Payer: Self-pay

## 2019-11-10 ENCOUNTER — Ambulatory Visit
Admission: RE | Admit: 2019-11-10 | Discharge: 2019-11-10 | Disposition: A | Discharge status: Home / Self Care | Payer: Medicare HMO | Source: Ambulatory Visit | Attending: *Deleted | Admitting: *Deleted

## 2019-11-10 DIAGNOSIS — Z1231 Encounter for screening mammogram for malignant neoplasm of breast: Secondary | ICD-10-CM | POA: Diagnosis not present

## 2019-12-01 DIAGNOSIS — I25119 Atherosclerotic heart disease of native coronary artery with unspecified angina pectoris: Secondary | ICD-10-CM | POA: Diagnosis not present

## 2019-12-01 DIAGNOSIS — E785 Hyperlipidemia, unspecified: Secondary | ICD-10-CM | POA: Diagnosis not present

## 2019-12-01 DIAGNOSIS — Z809 Family history of malignant neoplasm, unspecified: Secondary | ICD-10-CM | POA: Diagnosis not present

## 2019-12-01 DIAGNOSIS — Z7982 Long term (current) use of aspirin: Secondary | ICD-10-CM | POA: Diagnosis not present

## 2019-12-01 DIAGNOSIS — R69 Illness, unspecified: Secondary | ICD-10-CM | POA: Diagnosis not present

## 2019-12-01 DIAGNOSIS — G8929 Other chronic pain: Secondary | ICD-10-CM | POA: Diagnosis not present

## 2019-12-01 DIAGNOSIS — I10 Essential (primary) hypertension: Secondary | ICD-10-CM | POA: Diagnosis not present

## 2019-12-01 DIAGNOSIS — M1990 Unspecified osteoarthritis, unspecified site: Secondary | ICD-10-CM | POA: Diagnosis not present

## 2019-12-01 DIAGNOSIS — I252 Old myocardial infarction: Secondary | ICD-10-CM | POA: Diagnosis not present

## 2019-12-02 DIAGNOSIS — N816 Rectocele: Secondary | ICD-10-CM | POA: Diagnosis not present

## 2019-12-02 DIAGNOSIS — N8111 Cystocele, midline: Secondary | ICD-10-CM | POA: Diagnosis not present

## 2019-12-02 DIAGNOSIS — R32 Unspecified urinary incontinence: Secondary | ICD-10-CM | POA: Diagnosis not present

## 2019-12-19 ENCOUNTER — Other Ambulatory Visit: Payer: Self-pay

## 2019-12-19 ENCOUNTER — Telehealth (INDEPENDENT_AMBULATORY_CARE_PROVIDER_SITE_OTHER): Payer: Medicare HMO | Admitting: Family Medicine

## 2019-12-19 DIAGNOSIS — J302 Other seasonal allergic rhinitis: Secondary | ICD-10-CM

## 2019-12-19 MED ORDER — CETIRIZINE HCL 10 MG PO TABS: 10.0000 mg | ORAL_TABLET | Freq: Every day | ORAL | 11 refills | Status: DC

## 2019-12-19 NOTE — Assessment & Plan Note (Signed)
Briefly discussed first-line medications for acute seasonal allergies.  Patient prefers Zyrtec at this time.  Sent to pharmacy.  Patient encouraged to take daily until she does not have symptoms anymore.  If she does not have any improvement, would recommend Flonase.  Patient reports she has a follow-up appointment on April 8 and will discuss at this appointment if she does not have any improvement.

## 2019-12-19 NOTE — Progress Notes (Signed)
[facility] Telemedicine Visit  Patient consented to have virtual visit. Method of visit: Telephone  Encounter participants: Patient: Terri Estrada - located at Home Provider: Melene Plan - located at [facility] Others (if applicable): none  Chief Complaint: Allergies  HPI: Patient calling to ask about the best allergy medication for her. Previously has taken 24 hour nondrowsy Benadryl which patient reports is not working for her any longer.  Her symptoms include scratchy throat.  She denies any watering, itchy eyes.  She is not interested in a nasal spray at this time and would prefer oral medication.  She prefers Zyrtec over Claritin.  ROS: per HPI  Pertinent PMHx: Sickle cell trait, mild aortic stenosis, obesity, diabetes 2  Exam:  Respiratory: No acute distress.  Speaking in full sentences.  Assessment/Plan:  Acute seasonal allergic rhinitis Briefly discussed first-line medications for acute seasonal allergies.  Patient prefers Zyrtec at this time.  Sent to pharmacy.  Patient encouraged to take daily until she does not have symptoms anymore.  If she does not have any improvement, would recommend Flonase.  Patient reports she has a follow-up appointment on April 8 and will discuss at this appointment if she does not have any improvement.    Time spent during visit with patient: 7 minutes

## 2019-12-31 ENCOUNTER — Other Ambulatory Visit: Payer: Self-pay

## 2020-01-15 ENCOUNTER — Encounter: Payer: Self-pay | Admitting: Family Medicine

## 2020-01-15 ENCOUNTER — Other Ambulatory Visit: Payer: Self-pay

## 2020-01-15 ENCOUNTER — Ambulatory Visit (INDEPENDENT_AMBULATORY_CARE_PROVIDER_SITE_OTHER): Payer: Medicare HMO | Admitting: Family Medicine

## 2020-01-15 VITALS — BP 150/80 | HR 85 | Wt 196.8 lb

## 2020-01-15 DIAGNOSIS — I10 Essential (primary) hypertension: Secondary | ICD-10-CM | POA: Diagnosis not present

## 2020-01-15 DIAGNOSIS — E785 Hyperlipidemia, unspecified: Secondary | ICD-10-CM

## 2020-01-15 DIAGNOSIS — E119 Type 2 diabetes mellitus without complications: Secondary | ICD-10-CM | POA: Diagnosis not present

## 2020-01-15 DIAGNOSIS — Z1321 Encounter for screening for nutritional disorder: Secondary | ICD-10-CM | POA: Diagnosis not present

## 2020-01-15 DIAGNOSIS — Z0000 Encounter for general adult medical examination without abnormal findings: Secondary | ICD-10-CM

## 2020-01-15 LAB — POCT GLYCOSYLATED HEMOGLOBIN (HGB A1C): HbA1c, POC (controlled diabetic range): 6 % (ref 0.0–7.0)

## 2020-01-15 NOTE — Assessment & Plan Note (Signed)
Discussed colonoscopy with patient. Last colonoscopy in February 2020. Results to be scanned into media tab. -Colonoscopy completed 11/2018 -Pneumonia vaccine to be completed October/2021 at follow-up appointment per patient request -Vitamin D level collected

## 2020-01-15 NOTE — Assessment & Plan Note (Signed)
Pressure elevated 150/80 in office today. Previous visit vital signs show majority of blood pressures within normal limits. Patient medications include amlodipine 10, hydrochlorothiazide 25, Imdur 30, and losartan 25 mg. Patient reports no problems with taking medications regularly -Continue hydrochlorothiazide, Imdur, losartan, amlodipine -CMP, magnesium checked today

## 2020-01-15 NOTE — Assessment & Plan Note (Signed)
Diabetes well controlled with lifestyle including diet and regular physical activity. Patient states that now it is more like she will be walking more. Hemoglobin A1c checked today. -Hemoglobin A1c, 6.0 -Encourage patient to continue with lifestyle modifications to manage diabetes

## 2020-01-15 NOTE — Progress Notes (Signed)
    SUBJECTIVE:   CHIEF COMPLAINT / HPI: Follow-up diabetes and cholesterol  Diabetes Patient requesting to have hemoglobin A1c checked. Last hemoglobin A1c 6.0. Patient states that she continues to walk regularly and is able to get out more given that the weather is getting warmer. Denies any polydipsia or polyuria.   Hypertension Patient states that her blood pressure seems to elevate when she has to walk with her mask is otherwise fine. She denies chest pain, headache, blurry vision. Requested her electrolytes including magnesium checked. Patient continues to take amlodipine 10 mg, hydrochlorothiazide 25 mg, Imdur 30 mg, and losartan 25 mg.  Hyperlipidemia Patient requests to have her lipid levels checked. Last lipid panel in August 2020. Lipid panel at that time within normal limits. Total cholesterol 141, HDL 56, LDL 66, triglycerides 94. Patient is on atorvastatin 80 mg.  Health maintenance Patient provides documentation of results from colonoscopy in February 2020. Completed by [facility]. Patient with diverticula. Recommended to have follow-up in 10 years. Form completed and placed in front office to be scanned into chart. Patient informed that she will need a second pneumonia vaccine now that she is over 55, patient would like to have this vaccine in October when she returns. Patient is also requesting to have her vitamin D level checked.  PERTINENT  PMH / PSH: Type 2 diabetes (Well-controlled), hypertension, hyperlipidemia  OBJECTIVE:   BP (!) 150/80   Pulse 85   Wt 196 lb 12.8 oz (89.3 kg)   SpO2 99%   BMI 36.00 kg/m    Physical exam: General: Pleasant female appearing stated age in no acute distress HEENT:  no scleral icterus or conjunctival injection Cardiac:RRR, split S2 with inspiration Pulmonary: CTAB without wheezing, crackles, stable on RA, normal WOB Abdomen: soft, NT, +bowel sounds throughout, no rebound, no guarding Extremities: Trace bilateral lower extremity  edema Neuro: alert and oriented x4  ASSESSMENT/PLAN:   HYPERTENSION, BENIGN SYSTEMIC Pressure elevated 150/80 in office today. Previous visit vital signs show majority of blood pressures within normal limits. Patient medications include amlodipine 10, hydrochlorothiazide 25, Imdur 30, and losartan 25 mg. Patient reports no problems with taking medications regularly -Continue hydrochlorothiazide, Imdur, losartan, amlodipine -CMP, magnesium checked today   Diabetes mellitus type 2, controlled, without complications Diabetes well controlled with lifestyle including diet and regular physical activity. Patient states that now it is more like she will be walking more. Hemoglobin A1c checked today. -Hemoglobin A1c, 6.0 -Encourage patient to continue with lifestyle modifications to manage diabetes   Hyperlipidemia Hyperlipidemia, chronic, well controlled. Patient on atorvastatin 80 mg daily. Last lipid panel in August 2020 with all components within normal limits. -Lipid panel collected today -Continue atorvastatin 80 mg  Healthcare maintenance Discussed colonoscopy with patient. Last colonoscopy in February 2020. Results to be scanned into media tab. -Colonoscopy completed 11/2018 -Pneumonia vaccine to be completed October/2021 at follow-up appointment per patient request -Vitamin D level collected   Patient to follow-up in October 2020  Nicki Guadalajara, MD [facility]

## 2020-01-15 NOTE — Assessment & Plan Note (Signed)
Hyperlipidemia, chronic, well controlled. Patient on atorvastatin 80 mg daily. Last lipid panel in August 2020 with all components within normal limits. -Lipid panel collected today -Continue atorvastatin 80 mg

## 2020-01-15 NOTE — Patient Instructions (Addendum)
It was a pleasure to see you today! Thank you for choosing [facility] for your primary care. Terri Estrada was seen for follow up for follow-up and lab work.   Our plans for today were:  Checking your blood for cholesterol, magnesium, vitamin D and electrolytes.  Globin A1c prediabetes-6.0, still within appropriate range  I will notify you if you have any abnormal lab thoughts.  To keep you healthy, we need to monitor some screening tests.   You are due for :  1. Pneumonia vaccine-we will administer this vaccine when you return in October.   You should return to our clinic to see Dr. Sharol Harness in October, 2021 or earlier if needed.  Best wishes,   Dr. Sharol Harness

## 2020-01-16 LAB — COMPREHENSIVE METABOLIC PANEL
ALT: 27 IU/L (ref 0–32)
AST: 31 IU/L (ref 0–40)
Albumin/Globulin Ratio: 1.8 (ref 1.2–2.2)
Albumin: 4.7 g/dL (ref 3.8–4.8)
Alkaline Phosphatase: 87 IU/L (ref 39–117)
BUN/Creatinine Ratio: 17 (ref 12–28)
BUN: 13 mg/dL (ref 8–27)
Bilirubin Total: 0.4 mg/dL (ref 0.0–1.2)
CO2: 24 mmol/L (ref 20–29)
Calcium: 9.7 mg/dL (ref 8.7–10.3)
Chloride: 102 mmol/L (ref 96–106)
Creatinine, Ser: 0.77 mg/dL (ref 0.57–1.00)
GFR calc Af Amer: 92 mL/min/{1.73_m2} (ref >59)
GFR calc non Af Amer: 80 mL/min/{1.73_m2} (ref >59)
Globulin, Total: 2.6 g/dL (ref 1.5–4.5)
Glucose: 86 mg/dL (ref 65–99)
Potassium: 3.9 mmol/L (ref 3.5–5.2)
Sodium: 141 mmol/L (ref 134–144)
Total Protein: 7.3 g/dL (ref 6.0–8.5)

## 2020-01-16 LAB — LIPID PANEL
Chol/HDL Ratio: 2.7 ratio (ref 0.0–4.4)
Cholesterol, Total: 160 mg/dL (ref 100–199)
HDL: 60 mg/dL (ref >39)
LDL Chol Calc (NIH): 87 mg/dL (ref 0–99)
Triglycerides: 64 mg/dL (ref 0–149)
VLDL Cholesterol Cal: 13 mg/dL (ref 5–40)

## 2020-01-16 LAB — VITAMIN D 25 HYDROXY (VIT D DEFICIENCY, FRACTURES): Vit D, 25-Hydroxy: 93.8 ng/mL (ref 30.0–100.0)

## 2020-01-16 LAB — MAGNESIUM: Magnesium: 2 mg/dL (ref 1.6–2.3)

## 2020-01-19 ENCOUNTER — Encounter: Payer: Self-pay | Admitting: Family Medicine

## 2020-02-11 ENCOUNTER — Telehealth: Payer: Self-pay | Admitting: Cardiology

## 2020-02-11 NOTE — Telephone Encounter (Signed)
   Pt c/o medication issue:  1. Name of Medication: antibiotics  2. How are you currently taking this medication (dosage and times per day)?   3. Are you having a reaction (difficulty breathing--STAT)?   4. What is your medication issue? Pt would like to know what kind of antibiotics she can take prior her surgery  Please advise

## 2020-02-11 NOTE — Telephone Encounter (Signed)
Yes there is no problem for her to take prophylactic antibiotics.  Good luck with your surgery.

## 2020-02-11 NOTE — Telephone Encounter (Signed)
Pt informed that she does not need pre-meds before her procedure, per Dr. Delton See.  Pt verbalized understanding and agrees with this plan.

## 2020-02-11 NOTE — Telephone Encounter (Signed)
Dr. Delton See, pt will have total knee replacement on 5/17.  She is asking if she will need prophylactic antibiotics to be prescribed prior to this procedure.  We cleared the pt for her surgery back in 09/2019, by our pre-op PA-C.  They did not ask for pre-meds then.  Please advise.

## 2020-02-17 ENCOUNTER — Telehealth: Payer: Self-pay | Admitting: Orthopaedic Surgery

## 2020-02-17 DIAGNOSIS — N8111 Cystocele, midline: Secondary | ICD-10-CM | POA: Diagnosis not present

## 2020-02-17 DIAGNOSIS — N816 Rectocele: Secondary | ICD-10-CM | POA: Diagnosis not present

## 2020-02-17 DIAGNOSIS — Z4689 Encounter for fitting and adjustment of other specified devices: Secondary | ICD-10-CM | POA: Diagnosis not present

## 2020-02-17 NOTE — Telephone Encounter (Signed)
Patient called stating that she had some questions in regards to her surgery that is upcoming on Monday, May 17th.  CB#[phone].  It is important that she speak with you ASAP.  Thank you.

## 2020-02-18 NOTE — Telephone Encounter (Signed)
Per Dr. Roda Shutters, okay to leave pessary in during surgery.  I called and advised patient as well as Meriam Sprague at [employer] [phone].

## 2020-02-18 NOTE — Progress Notes (Signed)
Your procedure is scheduled on Monday May 17.  Report to [facility] Main Entrance "A" at 08:00 A.M., and check in at the Admitting office.  Call this number if you have problems the morning of surgery: [phone]  Call [phone] if you have any questions prior to your surgery date Monday-Friday 8am-4pm   Remember: Do not eat or drink after midnight the night before your surgery  You may drink clear liquids until 07:00 A.M. the morning of your surgery.   Clear liquids allowed are: Water, Non-Citrus Juices (without pulp), Carbonated Beverages, Clear Tea, Black Coffee Only, and Gatorade   Take these medicines the morning of surgery with A SIP OF WATER: acetaminophen (TYLENOL)  amLODipine (NORVASC) atorvastatin (LIPITOR)  cetirizine (ZYRTEC)  isosorbide mononitrate (IMDUR)   Follow your surgeon's instructions on when to stop Aspirin.  If no instructions were given by your surgeon then you will need to call the office to get those instructions.     As of today, STOP taking any Aspirin containing products, Aleve, Naproxen, Ibuprofen, Motrin, Advil, Goody's, BC's, all herbal medications, fish oil, and all vitamins.    The Morning of Surgery  Do not wear jewelry, make-up or nail polish.  Do not wear lotions, powders, perfumes, or deodorant  Do not shave 48 hours prior to surgery.    Do not bring valuables to the hospital.  [facility] is not responsible for any belongings or valuables.  If you are a smoker, DO NOT Smoke 24 hours prior to surgery  If you wear a CPAP at night please bring your mask the morning of surgery   Remember that you must have someone to transport you home after your surgery, and remain with you for 24 hours if you are discharged the same day.   Please bring cases for contacts, glasses, hearing aids, dentures or bridgework because it cannot be worn into surgery.    Leave your suitcase in the car.  After surgery it may be brought to your room.  For  patients admitted to the hospital, discharge time will be determined by your treatment team.  Patients discharged the day of surgery will not be allowed to drive home.    Special instructions:   [facility]- Preparing For Surgery  Before surgery, you can play an important role. Because skin is not sterile, your skin needs to be as free of germs as possible. You can reduce the number of germs on your skin by washing with CHG (chlorahexidine gluconate) Soap before surgery.  CHG is an antiseptic cleaner which kills germs and bonds with the skin to continue killing germs even after washing.    Oral Hygiene is also important to reduce your risk of infection.  Remember - BRUSH YOUR TEETH THE MORNING OF SURGERY WITH YOUR REGULAR TOOTHPASTE  Please do not use if you have an allergy to CHG or antibacterial soaps. If your skin becomes reddened/irritated stop using the CHG.  Do not shave (including legs and underarms) for at least 48 hours prior to first CHG shower. It is OK to shave your face.  Please follow these instructions carefully.   1. Shower the NIGHT BEFORE SURGERY and the MORNING OF SURGERY with CHG Soap.   2. If you chose to wash your hair and body, wash as usual with your normal shampoo and body-wash/soap.  3. Rinse your hair and body thoroughly to remove the shampoo and soap.  4. Apply CHG directly to the skin (ONLY FROM THE NECK DOWN)  and wash gently with a scrungie or a clean washcloth.   5. Do not use on open wounds or open sores. Avoid contact with your eyes, ears, mouth and genitals (private parts). Wash Face and genitals (private parts)  with your normal soap.   6. Wash thoroughly, paying special attention to the area where your surgery will be performed.  7. Thoroughly rinse your body with warm water from the neck down.  8. DO NOT shower/wash with your normal soap after using and rinsing off the CHG Soap.  9. Pat yourself dry with a CLEAN TOWEL.  10. Wear CLEAN PAJAMAS to  bed the night before surgery  11. Place CLEAN SHEETS on your bed the night of your first shower and DO NOT SLEEP WITH PETS.  12. Wear comfortable clothes the morning of surgery.     Day of Surgery:  Please shower the morning of surgery with the CHG soap Do not apply any deodorants/lotions. Please wear clean clothes to the hospital/surgery center.   Remember to brush your teeth WITH YOUR REGULAR TOOTHPASTE.   Please read over the following fact sheets that you were given.

## 2020-02-19 ENCOUNTER — Other Ambulatory Visit (HOSPITAL_COMMUNITY)
Admission: RE | Admit: 2020-02-19 | Discharge: 2020-02-19 | Disposition: A | Discharge status: Home / Self Care | Payer: Medicare HMO | Source: Ambulatory Visit | Attending: Orthopaedic Surgery | Admitting: Orthopaedic Surgery

## 2020-02-19 ENCOUNTER — Other Ambulatory Visit: Payer: Self-pay

## 2020-02-19 ENCOUNTER — Encounter (HOSPITAL_COMMUNITY)
Admission: RE | Admit: 2020-02-19 | Discharge: 2020-02-19 | Disposition: A | Discharge status: Home / Self Care | Payer: Medicare HMO | Source: Ambulatory Visit | Attending: Orthopaedic Surgery | Admitting: Orthopaedic Surgery

## 2020-02-19 ENCOUNTER — Encounter (HOSPITAL_COMMUNITY): Payer: Self-pay

## 2020-02-19 ENCOUNTER — Ambulatory Visit (HOSPITAL_COMMUNITY)
Admission: RE | Admit: 2020-02-19 | Discharge: 2020-02-19 | Disposition: A | Discharge status: Home / Self Care | Payer: Medicare HMO | Source: Ambulatory Visit | Attending: Physician Assistant | Admitting: Physician Assistant

## 2020-02-19 DIAGNOSIS — Z7982 Long term (current) use of aspirin: Secondary | ICD-10-CM | POA: Insufficient documentation

## 2020-02-19 DIAGNOSIS — Z01818 Encounter for other preprocedural examination: Secondary | ICD-10-CM | POA: Diagnosis not present

## 2020-02-19 DIAGNOSIS — E785 Hyperlipidemia, unspecified: Secondary | ICD-10-CM | POA: Diagnosis not present

## 2020-02-19 DIAGNOSIS — I252 Old myocardial infarction: Secondary | ICD-10-CM | POA: Insufficient documentation

## 2020-02-19 DIAGNOSIS — E669 Obesity, unspecified: Secondary | ICD-10-CM | POA: Diagnosis not present

## 2020-02-19 DIAGNOSIS — Z87891 Personal history of nicotine dependence: Secondary | ICD-10-CM | POA: Insufficient documentation

## 2020-02-19 DIAGNOSIS — D573 Sickle-cell trait: Secondary | ICD-10-CM | POA: Insufficient documentation

## 2020-02-19 DIAGNOSIS — Z955 Presence of coronary angioplasty implant and graft: Secondary | ICD-10-CM | POA: Diagnosis not present

## 2020-02-19 DIAGNOSIS — Z20822 Contact with and (suspected) exposure to covid-19: Secondary | ICD-10-CM | POA: Diagnosis not present

## 2020-02-19 DIAGNOSIS — M1711 Unilateral primary osteoarthritis, right knee: Secondary | ICD-10-CM | POA: Insufficient documentation

## 2020-02-19 DIAGNOSIS — I272 Pulmonary hypertension, unspecified: Secondary | ICD-10-CM | POA: Diagnosis not present

## 2020-02-19 DIAGNOSIS — I2510 Atherosclerotic heart disease of native coronary artery without angina pectoris: Secondary | ICD-10-CM | POA: Insufficient documentation

## 2020-02-19 DIAGNOSIS — E119 Type 2 diabetes mellitus without complications: Secondary | ICD-10-CM | POA: Diagnosis not present

## 2020-02-19 DIAGNOSIS — Z79899 Other long term (current) drug therapy: Secondary | ICD-10-CM | POA: Diagnosis not present

## 2020-02-19 DIAGNOSIS — I10 Essential (primary) hypertension: Secondary | ICD-10-CM | POA: Diagnosis not present

## 2020-02-19 LAB — CBC WITH DIFFERENTIAL/PLATELET
Abs Immature Granulocytes: 0.02 10*3/uL (ref 0.00–0.07)
Basophils Absolute: 0 10*3/uL (ref 0.0–0.1)
Basophils Relative: 1 %
Eosinophils Absolute: 0.2 10*3/uL (ref 0.0–0.5)
Eosinophils Relative: 4 %
HCT: 39.4 % (ref 36.0–46.0)
Hemoglobin: 13.1 g/dL (ref 12.0–15.0)
Immature Granulocytes: 0 %
Lymphocytes Relative: 35 %
Lymphs Abs: 1.7 10*3/uL (ref 0.7–4.0)
MCH: 30.5 pg (ref 26.0–34.0)
MCHC: 33.2 g/dL (ref 30.0–36.0)
MCV: 91.8 fL (ref 80.0–100.0)
Monocytes Absolute: 0.7 10*3/uL (ref 0.1–1.0)
Monocytes Relative: 14 %
Neutro Abs: 2.3 10*3/uL (ref 1.7–7.7)
Neutrophils Relative %: 46 %
Platelets: 325 10*3/uL (ref 150–400)
RBC: 4.29 MIL/uL (ref 3.87–5.11)
RDW: 13.2 % (ref 11.5–15.5)
WBC: 5 10*3/uL (ref 4.0–10.5)
nRBC: 0 % (ref 0.0–0.2)

## 2020-02-19 LAB — COMPREHENSIVE METABOLIC PANEL
ALT: 31 U/L (ref 0–44)
AST: 33 U/L (ref 15–41)
Albumin: 4.2 g/dL (ref 3.5–5.0)
Alkaline Phosphatase: 76 U/L (ref 38–126)
Anion gap: 9 (ref 5–15)
BUN: 17 mg/dL (ref 8–23)
CO2: 26 mmol/L (ref 22–32)
Calcium: 9.8 mg/dL (ref 8.9–10.3)
Chloride: 105 mmol/L (ref 98–111)
Creatinine, Ser: 0.67 mg/dL (ref 0.44–1.00)
GFR calc Af Amer: >60 mL/min (ref >60)
GFR calc non Af Amer: >60 mL/min (ref >60)
Glucose, Bld: 121 mg/dL — ABNORMAL HIGH (ref 70–99)
Potassium: 3.4 mmol/L — ABNORMAL LOW (ref 3.5–5.1)
Sodium: 140 mmol/L (ref 135–145)
Total Bilirubin: 0.6 mg/dL (ref 0.3–1.2)
Total Protein: 7.5 g/dL (ref 6.5–8.1)

## 2020-02-19 LAB — TYPE AND SCREEN
ABO/RH(D): O POS
Antibody Screen: NEGATIVE

## 2020-02-19 LAB — APTT: aPTT: 35 seconds (ref 24–36)

## 2020-02-19 LAB — SURGICAL PCR SCREEN
MRSA, PCR: NEGATIVE
Staphylococcus aureus: NEGATIVE

## 2020-02-19 LAB — URINALYSIS, ROUTINE W REFLEX MICROSCOPIC
Bilirubin Urine: NEGATIVE
Glucose, UA: NEGATIVE mg/dL
Hgb urine dipstick: NEGATIVE
Ketones, ur: NEGATIVE mg/dL
Leukocytes,Ua: NEGATIVE
Nitrite: NEGATIVE
Protein, ur: NEGATIVE mg/dL
Specific Gravity, Urine: 1.012 (ref 1.005–1.030)
pH: 6 (ref 5.0–8.0)

## 2020-02-19 LAB — PROTIME-INR
INR: 1 (ref 0.8–1.2)
Prothrombin Time: 13 seconds (ref 11.4–15.2)

## 2020-02-19 LAB — ABO/RH: ABO/RH(D): O POS

## 2020-02-19 LAB — SARS CORONAVIRUS 2 (TAT 6-24 HRS): SARS Coronavirus 2: NEGATIVE

## 2020-02-19 NOTE — Progress Notes (Signed)
PCP - Dr. Nicki Guadalajara Cardiologist - Dr. Eloy End  PPM/ICD - N/A Device Orders -N/A  Rep Notified - N/A  Chest x-ray - 02/19/20 EKG - 02/19/20 Stress Test - 2002 ECHO - 07/07/19 Cardiac Cath - 2001-1 stent  Sleep Study - denies CPAP - denies  Does not check CBG at home. PCP just monitors A1C. Pt was taken off diabetic meds. Last A1C 01/15/20 and was 6.0  Blood Thinner Instructions: N/A Aspirin Instructions: Hold 5 days pre-op. LD 02/16/20  ERAS Protcol -Protocol initiated. Pt given instructions on clears until 0700 DOS. PRE-SURGERY Ensure or G2- Pt provided with (1) orange G2.   COVID TEST- Scheduled for today after PAT appointment. Pt instructed to quarantine after testing until surgery. Pt verbalizes understanding.    Anesthesia review: Yes, hx of CAD. Cardiac clearance in Epic on 10/08/19. Pt's surgery was previously postponed. No changes since this last cardiac clearance for surgery.  Patient denies shortness of breath, fever, cough and chest pain at PAT appointment   All instructions explained to the patient, with a verbal understanding of the material. Patient agrees to go over the instructions while at home for a better understanding. Patient also instructed to self quarantine after being tested for COVID-19. The opportunity to ask questions was provided.   Coronavirus Screening  Have you experienced the following symptoms:  Cough yes/no: No Fever (>100.2F)  yes/no: No Runny nose yes/no: No Sore throat yes/no: No Difficulty breathing/shortness of breath  yes/no: No  Have you or a family member traveled in the last 14 days and where? yes/no: No   If the patient indicates "YES" to the above questions, their PAT will be rescheduled to limit the exposure to others and, the surgeon will be notified. THE PATIENT WILL NEED TO BE ASYMPTOMATIC FOR 14 DAYS.   If the patient is not experiencing any of these symptoms, the PAT nurse will instruct them to NOT bring anyone with  them to their appointment since they may have these symptoms or traveled as well.   Please remind your patients and families that hospital visitation restrictions are in effect and the importance of the restrictions.

## 2020-02-19 NOTE — Progress Notes (Signed)
Your procedure is scheduled on Monday May 17.  Report to [facility] Main Entrance "A" at 08:00 A.M., and check in at the Admitting office.  Call this number if you have problems the morning of surgery: [phone]  Call [phone] if you have any questions prior to your surgery date Monday-Friday 8am-4pm   Remember: Do not eat or drink after midnight the night before your surgery  You may drink clear liquids until 07:00 A.M. the morning of your surgery.   Clear liquids allowed are: Water, Non-Citrus Juices (without pulp), Carbonated Beverages, Clear Tea, Black Coffee Only, and Gatorade.   Enhanced Recovery after Surgery for Orthopedics Enhanced Recovery after Surgery is a protocol used to improve the stress on your body and your recovery after surgery.  Patient Instructions  . The night before surgery:  o No food after midnight. ONLY clear liquids after midnight  . The day of surgery (if you have diabetes): o  o Drink ONE (1) Gatorade 2 (G2) as directed. o This drink was given to you during your hospital  pre-op appointment visit.  o The pre-op nurse will instruct you on the time to drink the   Gatorade 2 (G2) depending on your surgery time. Please complete by 7:00 a.m. o Color of the Gatorade may vary. Red is not allowed.  o Nothing else to drink after completing the  Gatorade 2 (G2).         If you have questions, please contact your surgeon's office.    Take these medicines the morning of surgery with A SIP OF WATER: acetaminophen (TYLENOL)  amLODipine (NORVASC) atorvastatin (LIPITOR)  cetirizine (ZYRTEC)  isosorbide mononitrate (IMDUR)   Follow your surgeon's instructions on when to stop Aspirin.  If no instructions were given by your surgeon then you will need to call the office to get those instructions.     As of today, STOP taking any Aspirin containing products, Aleve, Naproxen, Ibuprofen, Motrin, Advil, Goody's, BC's, all herbal medications, fish oil,  and all vitamins.    The Morning of Surgery  Do not wear jewelry, make-up or nail polish.  Do not wear lotions, powders, perfumes, or deodorant  Do not shave 48 hours prior to surgery.    Do not bring valuables to the hospital.  [facility] is not responsible for any belongings or valuables.  If you are a smoker, DO NOT Smoke 24 hours prior to surgery  If you wear a CPAP at night please bring your mask the morning of surgery   Remember that you must have someone to transport you home after your surgery, and remain with you for 24 hours if you are discharged the same day.   Please bring cases for contacts, glasses, hearing aids, dentures or bridgework because it cannot be worn into surgery.    Leave your suitcase in the car.  After surgery it may be brought to your room.  For patients admitted to the hospital, discharge time will be determined by your treatment team.  Patients discharged the day of surgery will not be allowed to drive home.    Special instructions:   [facility]- Preparing For Surgery  Before surgery, you can play an important role. Because skin is not sterile, your skin needs to be as free of germs as possible. You can reduce the number of germs on your skin by washing with CHG (chlorahexidine gluconate) Soap before surgery.  CHG is an antiseptic cleaner which kills germs and bonds with the skin  to continue killing germs even after washing.    Oral Hygiene is also important to reduce your risk of infection.  Remember - BRUSH YOUR TEETH THE MORNING OF SURGERY WITH YOUR REGULAR TOOTHPASTE  Please do not use if you have an allergy to CHG or antibacterial soaps. If your skin becomes reddened/irritated stop using the CHG.  Do not shave (including legs and underarms) for at least 48 hours prior to first CHG shower. It is OK to shave your face.  Please follow these instructions carefully.   1. Shower the NIGHT BEFORE SURGERY and the MORNING OF SURGERY with CHG Soap.     2. If you chose to wash your hair and body, wash as usual with your normal shampoo and body-wash/soap.  3. Rinse your hair and body thoroughly to remove the shampoo and soap.  4. Apply CHG directly to the skin (ONLY FROM THE NECK DOWN) and wash gently with a scrungie or a clean washcloth.   5. Do not use on open wounds or open sores. Avoid contact with your eyes, ears, mouth and genitals (private parts). Wash Face and genitals (private parts)  with your normal soap.   6. Wash thoroughly, paying special attention to the area where your surgery will be performed.  7. Thoroughly rinse your body with warm water from the neck down.  8. DO NOT shower/wash with your normal soap after using and rinsing off the CHG Soap.  9. Pat yourself dry with a CLEAN TOWEL.  10. Wear CLEAN PAJAMAS to bed the night before surgery  11. Place CLEAN SHEETS on your bed the night of your first shower and DO NOT SLEEP WITH PETS.  12. Wear comfortable clothes the morning of surgery.     Day of Surgery:  Please shower the morning of surgery with the CHG soap Do not apply any deodorants/lotions. Please wear clean clothes to the hospital/surgery center.   Remember to brush your teeth WITH YOUR REGULAR TOOTHPASTE.   Please read over the following fact sheets that you were given.

## 2020-02-20 NOTE — Progress Notes (Signed)
Anesthesia Chart Review:  Case: [id number] Date/Time: 02/23/20 0947   Procedure: RIGHT TOTAL KNEE ARTHROPLASTY (Right Knee)   Anesthesia type: Spinal   Pre-op diagnosis: endstage right knee degenerative joint disease   [facility]: [facility] ROOM 04 / [facility] OR   Surgeons: Tarry Kos, MD      DISCUSSION: Patient is a 69 year old female scheduled for the above procedure. Surgery was postponed from 10/2019.   History includes former smoker, CAD (inferior MI, s/p PTCA/long BX velocity stent mid/distal RCA 08/30/00), sinus bradycardia (improved after metoprolol discontinued), DM2, HLD, HTN, sickle cell trait, diverticulosis. Mild pulmonary hypertension is listed in history due to PA peak pressure of 37 mmHg on 01/20/16 echo, but RVSP was normal at 26.4 mmHg on 07/07/19 echo.  BMI is consistent with obesity.  Preoperative cardiology input outlined on 10/08/19 by Corine Shelter, PA-C, "...based on ACC/AHA guidelines, Terri Estrada would be at acceptable risk for the planned procedure without further cardiovascular testing.  OK to hold aspirin 3-5 days pre op if needed."  Last ASA 02/16/2020.  Per Dr. Roda Shutters, okay to leave pessary in during surgery.  Presurgical COVID-19 test negative on 02/19/2020.  Anesthesia team to evaluate on the day of surgery.   VS: BP (!) 143/75   Pulse 86   Temp 36.6 C (Oral)   Resp 18   Ht 5\' 2"  (1.575 m)   Wt 89.7 kg   SpO2 99%   BMI 36.18 kg/m   PROVIDERS: , MD is PCP [facility]) last evaluation 01/15/2020. 03/16/2020, MD is cardiologist.  Last evaluation by Tobias Alexander, PA-C on 07/03/2019.   LABS: Labs reviewed: Acceptable for surgery.  A1c 6.0 on 01/15/2020. (all labs ordered are listed, but only abnormal results are displayed)  Labs Reviewed  COMPREHENSIVE METABOLIC PANEL - Abnormal; Notable for the following components:      Result Value   Potassium 3.4 (*)    Glucose, Bld 121 (*)    All other components within normal limits   SURGICAL PCR SCREEN  APTT  CBC WITH DIFFERENTIAL/PLATELET  PROTIME-INR  URINALYSIS, ROUTINE W REFLEX MICROSCOPIC  TYPE AND SCREEN  ABO/RH     IMAGES: CXR 02/19/20: FINDINGS: Normal heart size, mediastinal contours, and pulmonary vascularity. Minimal subsegmental atelectasis versus scarring in lingula. Remaining lungs clear. No pleural effusion or pneumothorax. Biconvex thoracolumbar scoliosis identified. IMPRESSION: Minimal atelectasis versus scarring in lingula.   EKG: 02/19/20:  Normal sinus rhythm Minimal voltage criteria for LVH, may be normal variant ( Cornell product ) Inferior infarct , age undetermined No significant change since last tracing Confirmed by Croitoru, Mihai [phone]) on 02/19/2020 11:37:34 AM   CV: Echo 07/07/19: IMPRESSIONS  1. Left ventricular ejection fraction, by visual estimation, is 60 to  65%. The left ventricle has normal function. Normal left ventricular size.  There is mildly increased left ventricular hypertrophy.  2. Left ventricular diastolic Doppler parameters are consistent with  impaired relaxation pattern of LV diastolic filling.  3. Global longitudinal strain= -23%.  4. Global right ventricle has normal systolic function.The right  ventricular size is normal. No increase in right ventricular wall  thickness. The estimated right ventricular systolic pressure is normal at  26.4 mmHg.   Nuclear stress test 10/29/00: IMPRESSION 1.  NO EVIDENCE OF STRESS INDUCED MYOCARDIAL ISCHEMIA.  INFERIOR WALL MYOCARDIAL SCAR. 2.  CALCULATED LEFT VENTRICULAR EJECTION FRACTION OF 55%.  Cardiac cath 08/30/00: 1. Left main coronary artery:  The left main coronary artery was patent. 2. Left anterior  descending coronary artery:  The LAD had 20%-30% midstenosis. The diagonal-I had mild disease. 3. Left circumflex coronary artery:  The left circumflex had 60%-70% midstenosis.  The OM-I has 80%-85% ostial stenosis.  The OM-II is patent which is  medium-sized.  The OM-III and OM-IV are very small. 4. Right coronary artery:  The right coronary artery was 100% occluded beyond the mid and distal junction. 5. Temporary transvenous pacemaker insertion via right femoral approach. PCI: Successful percutaneous transluminal coronary angioplasty to the mid and distal junctional right coronary artery using 3.5 mm x 1.5 mm long CrossSail balloon. Successful deployment of a 3.5 mm x 18.0 mm long BX velocity stent in the mid and distal junction of the right coronary artery.    Past Medical History:  Diagnosis Date  . Arthritis   . CAD in native artery    a. MI 2001 s/p PTCA/stenting to mid-distal junction of RCA - residual dx treated medically.  . Diverticulosis of colon (without mention of hemorrhage)   . Essential hypertension, benign   . MI (myocardial infarction) ([place])   . Mild pulmonary hypertension ([place])   . Obesity, unspecified   . Onychia and paronychia of toe   . Osteoarthrosis, unspecified whether generalized or localized, unspecified site   . Other and unspecified hyperlipidemia   . Sickle-cell trait ([place])   . Sinus bradycardia    a. HR 40s even on low dose metoprolol - BB stopped with resolution.  . Streptococcal sore throat   . Type II or unspecified type diabetes mellitus without mention of complication, not stated as uncontrolled     Past Surgical History:  Procedure Laterality Date  . ANGIOPLASTY  09/21/00  . CARDIAC CATHETERIZATION      MEDICATIONS: . acetaminophen (TYLENOL) 650 MG CR tablet  . amLODipine (NORVASC) 10 MG tablet  . aspirin EC 81 MG tablet  . atorvastatin (LIPITOR) 80 MG tablet  . Calcium Carbonate (CALCIUM 600) 1500 MG TABS  . cetirizine (ZYRTEC) 10 MG tablet  . cholecalciferol (VITAMIN D3) 25 MCG (1000 UNIT) tablet  . hydrochlorothiazide (HYDRODIURIL) 25 MG tablet  . isosorbide mononitrate (IMDUR) 30 MG 24 hr tablet  . losartan (COZAAR) 25 MG tablet  . Multiple Vitamins-Minerals (MULTIVITAMINS  THER. W/MINERALS) TABS  . Omega-3 Fatty Acids (FISH OIL) 1200 MG CAPS   No current facility-administered medications for this encounter.   Myra Gianotti, PA-C Surgical Short Stay/Anesthesiology [facility] Phone [phone] [facility] Phone [phone] 02/20/2020 10:49 AM

## 2020-02-20 NOTE — Anesthesia Preprocedure Evaluation (Addendum)
Anesthesia Evaluation  Patient identified by MRN, date of birth, ID band Patient awake    Reviewed: Allergy & Precautions, NPO status , Patient's Chart, lab work & pertinent test results  Airway Mallampati: III  TM Distance: >3 FB Neck ROM: Full    Dental no notable dental hx. (+) Edentulous Upper   Pulmonary neg pulmonary ROS, former smoker,    Pulmonary exam normal breath sounds clear to auscultation       Cardiovascular hypertension, Pt. on medications + CAD (inferior MI, s/p PTCA/long BX velocity stent mid/distal RCA 08/30/00), + Past MI and + Cardiac Stents  Normal cardiovascular exam Rhythm:Regular Rate:Normal  EKG: 02/19/20:  Normal sinus rhythm Minimal voltage criteria for LVH, may be normal variant ( Cornell product ) Inferior infarct , age undetermined No significant change since last tracing  Echo 07/07/19: IMPRESSIONS  1. Left ventricular ejection fraction, by visual estimation, is 60 to 65%. The left ventricle has normal function. Normal left ventricular size.  There is mildly increased left ventricular hypertrophy.  2. Left ventricular diastolic Doppler parameters are consistent with  impaired relaxation pattern of LV diastolic filling.  3. Global longitudinal strain= -23%.  4. Global right ventricle has normal systolic function.The right ventricular size is normal. No increase in right ventricular wall thickness. The estimated right ventricular systolic pressure is normal at 26.4 mmHg.    Neuro/Psych PSYCHIATRIC DISORDERS Depression negative neurological ROS     GI/Hepatic negative GI ROS, Neg liver ROS,   Endo/Other  negative endocrine ROSdiabetes, Type 2  Renal/GU negative Renal ROS  negative genitourinary   Musculoskeletal  (+) Arthritis ,   Abdominal   Peds  Hematology  (+) Blood dyscrasia, Sickle cell trait ,   Anesthesia Other Findings   Reproductive/Obstetrics                            Anesthesia Physical Anesthesia Plan  ASA: III  Anesthesia Plan: Spinal and Regional   Post-op Pain Management:  Regional for Post-op pain   Induction:   PONV Risk Score and Plan: 2 and Treatment may vary due to age or medical condition and Midazolam  Airway Management Planned: Natural Airway  Additional Equipment:   Intra-op Plan:   Post-operative Plan:   Informed Consent: I have reviewed the patients History and Physical, chart, labs and discussed the procedure including the risks, benefits and alternatives for the proposed anesthesia with the patient or authorized representative who has indicated his/her understanding and acceptance.     Dental advisory given  Plan Discussed with: CRNA  Anesthesia Plan Comments:        Anesthesia Quick Evaluation

## 2020-02-22 ENCOUNTER — Other Ambulatory Visit: Payer: Self-pay | Admitting: Orthopaedic Surgery

## 2020-02-22 MED ORDER — ASPIRIN EC 81 MG PO TBEC
81.0000 mg | DELAYED_RELEASE_TABLET | Freq: Two times a day (BID) | ORAL | 0 refills | Status: DC
Start: 2020-02-22 — End: 2020-04-05

## 2020-02-22 MED ORDER — METHOCARBAMOL 500 MG PO TABS
500.0000 mg | ORAL_TABLET | Freq: Four times a day (QID) | ORAL | 2 refills | Status: DC | PRN
Start: 2020-02-22 — End: 2020-06-18

## 2020-02-22 MED ORDER — OXYCODONE HCL ER 10 MG PO T12A: 10.0000 mg | EXTENDED_RELEASE_TABLET | Freq: Two times a day (BID) | ORAL | 0 refills | Status: AC

## 2020-02-22 MED ORDER — SULFAMETHOXAZOLE-TRIMETHOPRIM 800-160 MG PO TABS: 1.0000 | ORAL_TABLET | Freq: Two times a day (BID) | ORAL | 0 refills | Status: DC

## 2020-02-22 MED ORDER — ONDANSETRON HCL 4 MG PO TABS: 4.0000 mg | ORAL_TABLET | Freq: Three times a day (TID) | ORAL | 0 refills | Status: DC | PRN

## 2020-02-22 MED ORDER — OXYCODONE HCL 5 MG PO TABS: 5.0000 mg | ORAL_TABLET | Freq: Three times a day (TID) | ORAL | 0 refills | Status: DC | PRN

## 2020-02-22 NOTE — Discharge Instructions (Signed)

## 2020-02-23 ENCOUNTER — Ambulatory Visit (HOSPITAL_COMMUNITY): Payer: Medicare HMO | Admitting: Certified Registered Nurse Anesthetist

## 2020-02-23 ENCOUNTER — Observation Stay (HOSPITAL_COMMUNITY): Payer: Medicare HMO

## 2020-02-23 ENCOUNTER — Encounter (HOSPITAL_COMMUNITY): Payer: Self-pay | Admitting: Orthopaedic Surgery

## 2020-02-23 ENCOUNTER — Other Ambulatory Visit: Payer: Self-pay

## 2020-02-23 ENCOUNTER — Encounter (HOSPITAL_COMMUNITY)
Admission: RE | Disposition: A | Discharge status: Home / Self Care | Payer: Self-pay | Source: Home / Self Care | Attending: Orthopaedic Surgery

## 2020-02-23 ENCOUNTER — Ambulatory Visit (HOSPITAL_COMMUNITY): Payer: Medicare HMO | Admitting: Physician Assistant

## 2020-02-23 ENCOUNTER — Observation Stay (HOSPITAL_COMMUNITY)
Admission: RE | Admit: 2020-02-23 | Discharge: 2020-02-24 | Disposition: A | Discharge status: Home / Self Care | Payer: Medicare HMO | Attending: Orthopaedic Surgery | Admitting: Orthopaedic Surgery

## 2020-02-23 ENCOUNTER — Telehealth: Payer: Self-pay

## 2020-02-23 DIAGNOSIS — I252 Old myocardial infarction: Secondary | ICD-10-CM | POA: Insufficient documentation

## 2020-02-23 DIAGNOSIS — I10 Essential (primary) hypertension: Secondary | ICD-10-CM | POA: Diagnosis not present

## 2020-02-23 DIAGNOSIS — Z832 Family history of diseases of the blood and blood-forming organs and certain disorders involving the immune mechanism: Secondary | ICD-10-CM | POA: Diagnosis not present

## 2020-02-23 DIAGNOSIS — I272 Pulmonary hypertension, unspecified: Secondary | ICD-10-CM | POA: Insufficient documentation

## 2020-02-23 DIAGNOSIS — I2510 Atherosclerotic heart disease of native coronary artery without angina pectoris: Secondary | ICD-10-CM | POA: Insufficient documentation

## 2020-02-23 DIAGNOSIS — Z8249 Family history of ischemic heart disease and other diseases of the circulatory system: Secondary | ICD-10-CM | POA: Insufficient documentation

## 2020-02-23 DIAGNOSIS — E669 Obesity, unspecified: Secondary | ICD-10-CM | POA: Insufficient documentation

## 2020-02-23 DIAGNOSIS — I110 Hypertensive heart disease with heart failure: Secondary | ICD-10-CM | POA: Insufficient documentation

## 2020-02-23 DIAGNOSIS — Z833 Family history of diabetes mellitus: Secondary | ICD-10-CM | POA: Diagnosis not present

## 2020-02-23 DIAGNOSIS — Z955 Presence of coronary angioplasty implant and graft: Secondary | ICD-10-CM | POA: Diagnosis not present

## 2020-02-23 DIAGNOSIS — Z79899 Other long term (current) drug therapy: Secondary | ICD-10-CM | POA: Diagnosis not present

## 2020-02-23 DIAGNOSIS — Z7982 Long term (current) use of aspirin: Secondary | ICD-10-CM | POA: Diagnosis not present

## 2020-02-23 DIAGNOSIS — Z96651 Presence of right artificial knee joint: Secondary | ICD-10-CM

## 2020-02-23 DIAGNOSIS — I509 Heart failure, unspecified: Secondary | ICD-10-CM | POA: Insufficient documentation

## 2020-02-23 DIAGNOSIS — Z6835 Body mass index (BMI) 35.0-35.9, adult: Secondary | ICD-10-CM | POA: Diagnosis not present

## 2020-02-23 DIAGNOSIS — Z87891 Personal history of nicotine dependence: Secondary | ICD-10-CM | POA: Insufficient documentation

## 2020-02-23 DIAGNOSIS — Z471 Aftercare following joint replacement surgery: Secondary | ICD-10-CM | POA: Diagnosis not present

## 2020-02-23 DIAGNOSIS — E785 Hyperlipidemia, unspecified: Secondary | ICD-10-CM | POA: Insufficient documentation

## 2020-02-23 DIAGNOSIS — M1711 Unilateral primary osteoarthritis, right knee: Secondary | ICD-10-CM | POA: Diagnosis not present

## 2020-02-23 DIAGNOSIS — Z888 Allergy status to other drugs, medicaments and biological substances status: Secondary | ICD-10-CM | POA: Diagnosis not present

## 2020-02-23 DIAGNOSIS — J449 Chronic obstructive pulmonary disease, unspecified: Secondary | ICD-10-CM | POA: Diagnosis not present

## 2020-02-23 DIAGNOSIS — G8918 Other acute postprocedural pain: Secondary | ICD-10-CM | POA: Diagnosis not present

## 2020-02-23 DIAGNOSIS — E119 Type 2 diabetes mellitus without complications: Secondary | ICD-10-CM | POA: Insufficient documentation

## 2020-02-23 DIAGNOSIS — R2689 Other abnormalities of gait and mobility: Secondary | ICD-10-CM | POA: Diagnosis not present

## 2020-02-23 DIAGNOSIS — D571 Sickle-cell disease without crisis: Secondary | ICD-10-CM | POA: Insufficient documentation

## 2020-02-23 DIAGNOSIS — E7849 Other hyperlipidemia: Secondary | ICD-10-CM | POA: Diagnosis not present

## 2020-02-23 HISTORY — PX: TOTAL KNEE ARTHROPLASTY: SHX125

## 2020-02-23 LAB — GLUCOSE, CAPILLARY
Glucose-Capillary: 122 mg/dL — ABNORMAL HIGH (ref 70–99)
Glucose-Capillary: 137 mg/dL — ABNORMAL HIGH (ref 70–99)
Glucose-Capillary: 173 mg/dL — ABNORMAL HIGH (ref 70–99)
Glucose-Capillary: 157 mg/dL — ABNORMAL HIGH (ref 70–99)

## 2020-02-23 SURGERY — ARTHROPLASTY, KNEE, TOTAL
Anesthesia: Regional | Site: Knee | Laterality: Right

## 2020-02-23 MED ORDER — TRANEXAMIC ACID-NACL 1000-0.7 MG/100ML-% IV SOLN
1000.0000 mg | Freq: Once | INTRAVENOUS | Status: AC
  Administered 2020-02-23: 1000 mg via INTRAVENOUS
  Filled 2020-02-23: qty 100

## 2020-02-23 MED ORDER — OXYCODONE HCL 5 MG PO TABS
5.0000 mg | ORAL_TABLET | ORAL | Status: DC | PRN
  Administered 2020-02-23: 5 mg via ORAL
  Filled 2020-02-23: qty 2
  Filled 2020-02-23: qty 1

## 2020-02-23 MED ORDER — DEXAMETHASONE SODIUM PHOSPHATE 10 MG/ML IJ SOLN
10.0000 mg | Freq: Once | INTRAMUSCULAR | Status: AC
  Administered 2020-02-24: 10 mg via INTRAVENOUS
  Filled 2020-02-23: qty 1

## 2020-02-23 MED ORDER — DOCUSATE SODIUM 100 MG PO CAPS
100.0000 mg | ORAL_CAPSULE | Freq: Two times a day (BID) | ORAL | Status: DC
  Administered 2020-02-23 – 2020-02-24 (×2): 100 mg via ORAL
  Filled 2020-02-23 (×2): qty 1

## 2020-02-23 MED ORDER — SODIUM CHLORIDE 0.9 % IR SOLN
Status: DC | PRN
  Administered 2020-02-23: 3000 mL

## 2020-02-23 MED ORDER — DEXAMETHASONE SODIUM PHOSPHATE 10 MG/ML IJ SOLN
INTRAMUSCULAR | Status: DC | PRN
Start: 2020-02-23 — End: 2020-02-23
  Administered 2020-02-23: 5 mg via INTRAVENOUS

## 2020-02-23 MED ORDER — FENTANYL CITRATE (PF) 250 MCG/5ML IJ SOLN
INTRAMUSCULAR | Status: AC
  Filled 2020-02-23: qty 5

## 2020-02-23 MED ORDER — CEFAZOLIN SODIUM-DEXTROSE 2-4 GM/100ML-% IV SOLN
INTRAVENOUS | Status: AC
  Filled 2020-02-23: qty 100

## 2020-02-23 MED ORDER — SODIUM CHLORIDE 0.9% FLUSH
INTRAVENOUS | Status: DC | PRN
  Administered 2020-02-23: 20 mL

## 2020-02-23 MED ORDER — MAGNESIUM CITRATE PO SOLN: 1.0000 | Freq: Once | ORAL | Status: DC | PRN

## 2020-02-23 MED ORDER — DIPHENHYDRAMINE HCL 12.5 MG/5ML PO ELIX: 25.0000 mg | ORAL_SOLUTION | ORAL | Status: DC | PRN

## 2020-02-23 MED ORDER — VANCOMYCIN HCL 1000 MG IV SOLR
INTRAVENOUS | Status: AC
  Filled 2020-02-23: qty 1000

## 2020-02-23 MED ORDER — GABAPENTIN 300 MG PO CAPS
300.0000 mg | ORAL_CAPSULE | Freq: Three times a day (TID) | ORAL | Status: DC
  Administered 2020-02-23 – 2020-02-24 (×4): 300 mg via ORAL
  Filled 2020-02-23 (×4): qty 1

## 2020-02-23 MED ORDER — LACTATED RINGERS IV SOLN: INTRAVENOUS | Status: DC | PRN

## 2020-02-23 MED ORDER — CEFAZOLIN SODIUM-DEXTROSE 2-4 GM/100ML-% IV SOLN
2.0000 g | Freq: Four times a day (QID) | INTRAVENOUS | Status: AC
  Administered 2020-02-23 – 2020-02-24 (×3): 2 g via INTRAVENOUS
  Filled 2020-02-23 (×3): qty 100

## 2020-02-23 MED ORDER — LOSARTAN POTASSIUM 50 MG PO TABS
25.0000 mg | ORAL_TABLET | Freq: Every day | ORAL | Status: DC
  Administered 2020-02-23 – 2020-02-24 (×2): 25 mg via ORAL
  Filled 2020-02-23 (×2): qty 1

## 2020-02-23 MED ORDER — ONDANSETRON HCL 4 MG/2ML IJ SOLN
4.0000 mg | Freq: Four times a day (QID) | INTRAMUSCULAR | Status: DC | PRN
  Administered 2020-02-23 (×2): 4 mg via INTRAVENOUS
  Filled 2020-02-23: qty 2

## 2020-02-23 MED ORDER — HYDROMORPHONE HCL 1 MG/ML IJ SOLN
INTRAMUSCULAR | Status: DC | PRN
  Administered 2020-02-23: .5 mg via INTRAVENOUS
  Administered 2020-02-23 (×2): .25 mg via INTRAVENOUS

## 2020-02-23 MED ORDER — FENTANYL CITRATE (PF) 100 MCG/2ML IJ SOLN
INTRAMUSCULAR | Status: DC | PRN
  Administered 2020-02-23 (×3): 25 ug via INTRAVENOUS
  Administered 2020-02-23 (×2): 50 ug via INTRAVENOUS
  Administered 2020-02-23: 25 ug via INTRAVENOUS
  Administered 2020-02-23: 50 ug via INTRAVENOUS

## 2020-02-23 MED ORDER — AMLODIPINE BESYLATE 10 MG PO TABS
10.0000 mg | ORAL_TABLET | Freq: Every day | ORAL | Status: DC
  Administered 2020-02-24: 10 mg via ORAL
  Filled 2020-02-23 (×2): qty 1

## 2020-02-23 MED ORDER — METHOCARBAMOL 1000 MG/10ML IJ SOLN
500.0000 mg | Freq: Four times a day (QID) | INTRAVENOUS | Status: DC | PRN
  Filled 2020-02-23: qty 5

## 2020-02-23 MED ORDER — POLYETHYLENE GLYCOL 3350 17 G PO PACK: 17.0000 g | PACK | Freq: Every day | ORAL | Status: DC | PRN

## 2020-02-23 MED ORDER — TRANEXAMIC ACID 1000 MG/10ML IV SOLN
INTRAVENOUS | Status: DC | PRN
  Administered 2020-02-23: 2000 mg via TOPICAL

## 2020-02-23 MED ORDER — MIDAZOLAM HCL 2 MG/2ML IJ SOLN
INTRAMUSCULAR | Status: AC
  Filled 2020-02-23: qty 2

## 2020-02-23 MED ORDER — TRANEXAMIC ACID-NACL 1000-0.7 MG/100ML-% IV SOLN
INTRAVENOUS | Status: AC
  Filled 2020-02-23: qty 100

## 2020-02-23 MED ORDER — BUPIVACAINE HCL 0.25 % IJ SOLN
INTRAMUSCULAR | Status: DC | PRN
Start: 2020-02-23 — End: 2020-02-23
  Administered 2020-02-23: 20 mL

## 2020-02-23 MED ORDER — MIDAZOLAM HCL 2 MG/2ML IJ SOLN: 1.0000 mg | Freq: Once | INTRAMUSCULAR | Status: AC

## 2020-02-23 MED ORDER — CELECOXIB 200 MG PO CAPS
200.0000 mg | ORAL_CAPSULE | Freq: Two times a day (BID) | ORAL | Status: DC
  Filled 2020-02-23: qty 1

## 2020-02-23 MED ORDER — MENTHOL 3 MG MT LOZG: 1.0000 | LOZENGE | OROMUCOSAL | Status: DC | PRN

## 2020-02-23 MED ORDER — EPHEDRINE SULFATE 50 MG/ML IJ SOLN
INTRAMUSCULAR | Status: DC | PRN
  Administered 2020-02-23: 10 mg via INTRAVENOUS

## 2020-02-23 MED ORDER — LIDOCAINE 2% (20 MG/ML) 5 ML SYRINGE
INTRAMUSCULAR | Status: DC | PRN
  Administered 2020-02-23: 80 mg via INTRAVENOUS

## 2020-02-23 MED ORDER — ATORVASTATIN CALCIUM 80 MG PO TABS
80.0000 mg | ORAL_TABLET | Freq: Every day | ORAL | Status: DC
  Administered 2020-02-23 – 2020-02-24 (×2): 80 mg via ORAL
  Filled 2020-02-23 (×2): qty 1

## 2020-02-23 MED ORDER — ONDANSETRON HCL 4 MG/2ML IJ SOLN
INTRAMUSCULAR | Status: AC
  Filled 2020-02-23: qty 2

## 2020-02-23 MED ORDER — INSULIN ASPART 100 UNIT/ML ~~LOC~~ SOLN: 0.0000 [IU] | Freq: Every day | SUBCUTANEOUS | Status: DC

## 2020-02-23 MED ORDER — BUPIVACAINE LIPOSOME 1.3 % IJ SUSP
INTRAMUSCULAR | Status: DC | PRN
  Administered 2020-02-23: 20 mL

## 2020-02-23 MED ORDER — ACETAMINOPHEN 500 MG PO TABS: 1000.0000 mg | ORAL_TABLET | Freq: Once | ORAL | Status: DC

## 2020-02-23 MED ORDER — PHENYLEPHRINE HCL-NACL 10-0.9 MG/250ML-% IV SOLN
INTRAVENOUS | Status: DC | PRN
  Administered 2020-02-23: 50 ug/min via INTRAVENOUS

## 2020-02-23 MED ORDER — BUPIVACAINE HCL (PF) 0.25 % IJ SOLN
INTRAMUSCULAR | Status: AC
  Filled 2020-02-23: qty 30

## 2020-02-23 MED ORDER — METOCLOPRAMIDE HCL 5 MG/ML IJ SOLN: 5.0000 mg | Freq: Three times a day (TID) | INTRAMUSCULAR | Status: DC | PRN

## 2020-02-23 MED ORDER — FENTANYL CITRATE (PF) 100 MCG/2ML IJ SOLN: 25.0000 ug | INTRAMUSCULAR | Status: DC | PRN

## 2020-02-23 MED ORDER — POVIDONE-IODINE 10 % EX SWAB: 2.0000 "application " | Freq: Once | CUTANEOUS | Status: DC

## 2020-02-23 MED ORDER — FENTANYL CITRATE (PF) 100 MCG/2ML IJ SOLN: 50.0000 ug | Freq: Once | INTRAMUSCULAR | Status: AC

## 2020-02-23 MED ORDER — DEXAMETHASONE SODIUM PHOSPHATE 10 MG/ML IJ SOLN
INTRAMUSCULAR | Status: DC | PRN
  Administered 2020-02-23: 5 mg

## 2020-02-23 MED ORDER — ROPIVACAINE HCL 5 MG/ML IJ SOLN
INTRAMUSCULAR | Status: DC | PRN
Start: 2020-02-23 — End: 2020-02-23
  Administered 2020-02-23: 20 mL via PERINEURAL

## 2020-02-23 MED ORDER — METHOCARBAMOL 500 MG PO TABS
500.0000 mg | ORAL_TABLET | Freq: Four times a day (QID) | ORAL | Status: DC | PRN
  Administered 2020-02-24: 500 mg via ORAL
  Filled 2020-02-23: qty 1

## 2020-02-23 MED ORDER — HYDROMORPHONE HCL 1 MG/ML IJ SOLN
INTRAMUSCULAR | Status: AC
  Filled 2020-02-23: qty 0.5

## 2020-02-23 MED ORDER — CEFAZOLIN SODIUM-DEXTROSE 2-4 GM/100ML-% IV SOLN
2.0000 g | INTRAVENOUS | Status: AC
  Administered 2020-02-23: 2 g via INTRAVENOUS

## 2020-02-23 MED ORDER — HYDROMORPHONE HCL 1 MG/ML IJ SOLN: 0.2500 mg | INTRAMUSCULAR | Status: DC | PRN

## 2020-02-23 MED ORDER — KETOROLAC TROMETHAMINE 15 MG/ML IJ SOLN
30.0000 mg | Freq: Four times a day (QID) | INTRAMUSCULAR | Status: DC
  Administered 2020-02-23 (×2): 30 mg via INTRAVENOUS
  Filled 2020-02-23 (×2): qty 2

## 2020-02-23 MED ORDER — INSULIN ASPART 100 UNIT/ML ~~LOC~~ SOLN
0.0000 [IU] | Freq: Three times a day (TID) | SUBCUTANEOUS | Status: DC
  Administered 2020-02-23: 4 [IU] via SUBCUTANEOUS

## 2020-02-23 MED ORDER — SORBITOL 70 % SOLN: 30.0000 mL | Freq: Every day | Status: DC | PRN

## 2020-02-23 MED ORDER — TRANEXAMIC ACID-NACL 1000-0.7 MG/100ML-% IV SOLN
1000.0000 mg | INTRAVENOUS | Status: AC
  Administered 2020-02-23: 1000 mg via INTRAVENOUS

## 2020-02-23 MED ORDER — SODIUM CHLORIDE (PF) 0.9 % IJ SOLN
INTRAMUSCULAR | Status: AC
  Filled 2020-02-23: qty 50

## 2020-02-23 MED ORDER — VANCOMYCIN HCL 1000 MG IV SOLR
INTRAVENOUS | Status: DC | PRN
  Administered 2020-02-23: 1000 mg

## 2020-02-23 MED ORDER — HYDROMORPHONE HCL 1 MG/ML IJ SOLN: 0.5000 mg | INTRAMUSCULAR | Status: DC | PRN

## 2020-02-23 MED ORDER — PHENOL 1.4 % MT LIQD: 1.0000 | OROMUCOSAL | Status: DC | PRN

## 2020-02-23 MED ORDER — IRRISEPT - 450ML BOTTLE WITH 0.05% CHG IN STERILE WATER, USP 99.95% OPTIME
TOPICAL | Status: DC | PRN
  Administered 2020-02-23: 450 mL via TOPICAL

## 2020-02-23 MED ORDER — ALUM & MAG HYDROXIDE-SIMETH 200-200-20 MG/5ML PO SUSP: 30.0000 mL | ORAL | Status: DC | PRN

## 2020-02-23 MED ORDER — LACTATED RINGERS IV SOLN: INTRAVENOUS | Status: DC

## 2020-02-23 MED ORDER — ACETAMINOPHEN 325 MG PO TABS: 325.0000 mg | ORAL_TABLET | Freq: Four times a day (QID) | ORAL | Status: DC | PRN

## 2020-02-23 MED ORDER — OXYCODONE HCL 5 MG PO TABS
10.0000 mg | ORAL_TABLET | ORAL | Status: DC | PRN
  Administered 2020-02-24: 10 mg via ORAL
  Administered 2020-02-24: 15 mg via ORAL
  Filled 2020-02-23: qty 3

## 2020-02-23 MED ORDER — PROPOFOL 10 MG/ML IV BOLUS
INTRAVENOUS | Status: AC
  Filled 2020-02-23: qty 20

## 2020-02-23 MED ORDER — ACETAMINOPHEN 500 MG PO TABS
1000.0000 mg | ORAL_TABLET | Freq: Four times a day (QID) | ORAL | Status: AC
  Administered 2020-02-23 – 2020-02-24 (×4): 1000 mg via ORAL
  Filled 2020-02-23 (×4): qty 2

## 2020-02-23 MED ORDER — 0.9 % SODIUM CHLORIDE (POUR BTL) OPTIME
TOPICAL | Status: DC | PRN
  Administered 2020-02-23: 1000 mL

## 2020-02-23 MED ORDER — PROPOFOL 10 MG/ML IV BOLUS
INTRAVENOUS | Status: DC | PRN
  Administered 2020-02-23: 20 mg via INTRAVENOUS
  Administered 2020-02-23: 150 mg via INTRAVENOUS
  Administered 2020-02-23: 30 mg via INTRAVENOUS

## 2020-02-23 MED ORDER — ACETAMINOPHEN 500 MG PO TABS
ORAL_TABLET | ORAL | Status: AC
  Filled 2020-02-23: qty 2

## 2020-02-23 MED ORDER — ASPIRIN 81 MG PO CHEW
81.0000 mg | CHEWABLE_TABLET | Freq: Two times a day (BID) | ORAL | Status: DC
  Administered 2020-02-23 – 2020-02-24 (×2): 81 mg via ORAL
  Filled 2020-02-23 (×2): qty 1

## 2020-02-23 MED ORDER — HYDROCHLOROTHIAZIDE 25 MG PO TABS
25.0000 mg | ORAL_TABLET | Freq: Every day | ORAL | Status: DC
  Administered 2020-02-24: 25 mg via ORAL
  Filled 2020-02-23 (×2): qty 1

## 2020-02-23 MED ORDER — BUPIVACAINE LIPOSOME 1.3 % IJ SUSP
20.0000 mL | Freq: Once | INTRAMUSCULAR | Status: DC
  Filled 2020-02-23: qty 20

## 2020-02-23 MED ORDER — PHENYLEPHRINE HCL (PRESSORS) 10 MG/ML IV SOLN
INTRAVENOUS | Status: DC | PRN
  Administered 2020-02-23: 80 ug via INTRAVENOUS

## 2020-02-23 MED ORDER — ONDANSETRON HCL 4 MG PO TABS: 4.0000 mg | ORAL_TABLET | Freq: Four times a day (QID) | ORAL | Status: DC | PRN

## 2020-02-23 MED ORDER — OXYCODONE HCL ER 10 MG PO T12A
10.0000 mg | EXTENDED_RELEASE_TABLET | Freq: Two times a day (BID) | ORAL | Status: DC
  Administered 2020-02-24 (×2): 10 mg via ORAL
  Filled 2020-02-23 (×2): qty 1

## 2020-02-23 MED ORDER — FENTANYL CITRATE (PF) 100 MCG/2ML IJ SOLN
INTRAMUSCULAR | Status: AC
  Administered 2020-02-23: 50 ug via INTRAVENOUS
  Filled 2020-02-23: qty 2

## 2020-02-23 MED ORDER — TRANEXAMIC ACID 1000 MG/10ML IV SOLN
2000.0000 mg | INTRAVENOUS | Status: DC
  Filled 2020-02-23 (×2): qty 20

## 2020-02-23 MED ORDER — KETOROLAC TROMETHAMINE 15 MG/ML IJ SOLN
15.0000 mg | Freq: Four times a day (QID) | INTRAMUSCULAR | Status: AC
  Administered 2020-02-24 (×2): 15 mg via INTRAVENOUS
  Filled 2020-02-23 (×2): qty 1

## 2020-02-23 MED ORDER — SODIUM CHLORIDE 0.9 % IV SOLN: INTRAVENOUS | Status: DC

## 2020-02-23 MED ORDER — ISOSORBIDE MONONITRATE ER 30 MG PO TB24
30.0000 mg | ORAL_TABLET | Freq: Every day | ORAL | Status: DC
  Administered 2020-02-24: 30 mg via ORAL
  Filled 2020-02-23 (×2): qty 1

## 2020-02-23 MED ORDER — METOCLOPRAMIDE HCL 5 MG PO TABS: 5.0000 mg | ORAL_TABLET | Freq: Three times a day (TID) | ORAL | Status: DC | PRN

## 2020-02-23 MED ORDER — MIDAZOLAM HCL 2 MG/2ML IJ SOLN
INTRAMUSCULAR | Status: AC
  Administered 2020-02-23: 1 mg via INTRAVENOUS
  Filled 2020-02-23: qty 2

## 2020-02-23 MED ORDER — PROPOFOL 500 MG/50ML IV EMUL
INTRAVENOUS | Status: DC | PRN
  Administered 2020-02-23: 150 ug/kg/min via INTRAVENOUS

## 2020-02-23 MED ORDER — ONDANSETRON HCL 4 MG/2ML IJ SOLN
INTRAMUSCULAR | Status: DC | PRN
  Administered 2020-02-23: 4 mg via INTRAVENOUS

## 2020-02-23 SURGICAL SUPPLY — 74 items
ALCOHOL 70% 16 OZ (MISCELLANEOUS) ×3 IMPLANT
BAG DECANTER FOR FLEXI CONT (MISCELLANEOUS) ×3 IMPLANT
BANDAGE ESMARK 6X9 LF (GAUZE/BANDAGES/DRESSINGS) IMPLANT
BLADE SAG 18X100X1.27 (BLADE) ×3 IMPLANT
BNDG ESMARK 6X9 LF (GAUZE/BANDAGES/DRESSINGS)
BOWL SMART MIX CTS (DISPOSABLE) ×3 IMPLANT
CEMENT BONE REFOBACIN R1X40 US (Cement) ×6 IMPLANT
CLOSURE STERI-STRIP 1/2X4 (GAUZE/BANDAGES/DRESSINGS) ×2
CLSR STERI-STRIP ANTIMIC 1/2X4 (GAUZE/BANDAGES/DRESSINGS) ×4 IMPLANT
COMP FEM CMT CR PERS SZ8 RT (Joint) ×3 IMPLANT
COMPONENT FEM CMT CR PRS SZ8RT (Joint) ×1 IMPLANT
COVER SURGICAL LIGHT HANDLE (MISCELLANEOUS) ×3 IMPLANT
COVER WAND RF STERILE (DRAPES) IMPLANT
CUFF TOURN SGL QUICK 34 (TOURNIQUET CUFF) ×3
CUFF TOURN SGL QUICK 42 (TOURNIQUET CUFF) IMPLANT
CUFF TRNQT CYL 34X4.125X (TOURNIQUET CUFF) ×1 IMPLANT
DERMABOND ADVANCED (GAUZE/BANDAGES/DRESSINGS) ×2
DERMABOND ADVANCED .7 DNX12 (GAUZE/BANDAGES/DRESSINGS) ×1 IMPLANT
DRAPE EXTREMITY T 121X128X90 (DISPOSABLE) ×3 IMPLANT
DRAPE HALF SHEET 40X57 (DRAPES) ×3 IMPLANT
DRAPE INCISE IOBAN 66X45 STRL (DRAPES) IMPLANT
DRAPE ORTHO SPLIT 77X108 STRL (DRAPES) ×6
DRAPE POUCH INSTRU U-SHP 10X18 (DRAPES) ×3 IMPLANT
DRAPE SURG ORHT 6 SPLT 77X108 (DRAPES) ×2 IMPLANT
DRAPE U-SHAPE 47X51 STRL (DRAPES) ×6 IMPLANT
DRSG AQUACEL AG ADV 3.5X10 (GAUZE/BANDAGES/DRESSINGS) ×3 IMPLANT
DURAPREP 26ML APPLICATOR (WOUND CARE) ×9 IMPLANT
ELECT CAUTERY BLADE 6.4 (BLADE) ×3 IMPLANT
ELECT REM PT RETURN 9FT ADLT (ELECTROSURGICAL) ×3
ELECTRODE REM PT RTRN 9FT ADLT (ELECTROSURGICAL) ×1 IMPLANT
GLOVE BIOGEL PI IND STRL 7.0 (GLOVE) ×1 IMPLANT
GLOVE BIOGEL PI INDICATOR 7.0 (GLOVE) ×2
GLOVE ECLIPSE 7.0 STRL STRAW (GLOVE) ×9 IMPLANT
GLOVE SKINSENSE NS SZ7.5 (GLOVE) ×6
GLOVE SKINSENSE STRL SZ7.5 (GLOVE) ×3 IMPLANT
GLOVE SURG SYN 7.5  E (GLOVE) ×12
GLOVE SURG SYN 7.5 E (GLOVE) ×4 IMPLANT
GOWN STRL REIN XL XLG (GOWN DISPOSABLE) ×3 IMPLANT
GOWN STRL REUS W/ TWL LRG LVL3 (GOWN DISPOSABLE) ×1 IMPLANT
GOWN STRL REUS W/TWL LRG LVL3 (GOWN DISPOSABLE) ×3
HANDPIECE INTERPULSE COAX TIP (DISPOSABLE) ×3
HOOD PEEL AWAY FLYTE STAYCOOL (MISCELLANEOUS) ×6 IMPLANT
INSERT TIB ARTISURF SZ8-11X12 (Insert) ×3 IMPLANT
JET LAVAGE IRRISEPT WOUND (IRRIGATION / IRRIGATOR) ×3
KIT BASIN OR (CUSTOM PROCEDURE TRAY) ×3 IMPLANT
KIT TURNOVER KIT B (KITS) ×3 IMPLANT
LAVAGE JET IRRISEPT WOUND (IRRIGATION / IRRIGATOR) ×1 IMPLANT
MANIFOLD NEPTUNE II (INSTRUMENTS) ×3 IMPLANT
MARKER SKIN DUAL TIP RULER LAB (MISCELLANEOUS) ×3 IMPLANT
NEEDLE SPNL 18GX3.5 QUINCKE PK (NEEDLE) ×6 IMPLANT
NS IRRIG 1000ML POUR BTL (IV SOLUTION) ×3 IMPLANT
PACK TOTAL JOINT (CUSTOM PROCEDURE TRAY) ×3 IMPLANT
PAD ARMBOARD 7.5X6 YLW CONV (MISCELLANEOUS) ×6 IMPLANT
SAW OSC TIP CART 19.5X105X1.3 (SAW) ×3 IMPLANT
SET HNDPC FAN SPRY TIP SCT (DISPOSABLE) ×1 IMPLANT
STAPLER VISISTAT 35W (STAPLE) IMPLANT
STEM POLY PAT PLY 32M KNEE (Knees) ×3 IMPLANT
STEM TIBIA 5 DEG SZ E R KNEE (Knees) ×1 IMPLANT
SUCTION FRAZIER HANDLE 10FR (MISCELLANEOUS) ×3
SUCTION TUBE FRAZIER 10FR DISP (MISCELLANEOUS) ×1 IMPLANT
SUT ETHILON 2 0 FS 18 (SUTURE) IMPLANT
SUT MNCRL AB 4-0 PS2 18 (SUTURE) IMPLANT
SUT VIC AB 0 CT1 27 (SUTURE) ×6
SUT VIC AB 0 CT1 27XBRD ANBCTR (SUTURE) ×2 IMPLANT
SUT VIC AB 1 CTX 27 (SUTURE) ×9 IMPLANT
SUT VIC AB 2-0 CT1 27 (SUTURE) ×12
SUT VIC AB 2-0 CT1 TAPERPNT 27 (SUTURE) ×4 IMPLANT
SYR 50ML LL SCALE MARK (SYRINGE) ×6 IMPLANT
TIBIA STEM 5 DEG SZ E R KNEE (Knees) ×3 IMPLANT
TOWEL GREEN STERILE (TOWEL DISPOSABLE) ×3 IMPLANT
TOWEL GREEN STERILE FF (TOWEL DISPOSABLE) ×3 IMPLANT
TRAY CATH 16FR W/PLASTIC CATH (SET/KITS/TRAYS/PACK) IMPLANT
UNDERPAD 30X36 HEAVY ABSORB (UNDERPADS AND DIAPERS) ×3 IMPLANT
WRAP KNEE MAXI GEL POST OP (GAUZE/BANDAGES/DRESSINGS) ×3 IMPLANT

## 2020-02-23 NOTE — Anesthesia Procedure Notes (Signed)
Anesthesia Regional Block: Adductor canal block   Pre-Anesthetic Checklist: ,, timeout performed, Correct Patient, Correct Site, Correct Laterality, Correct Procedure, Correct Position, site marked, Risks and benefits discussed,  Surgical consent,  Pre-op evaluation,  At surgeon's request and post-op pain management  Laterality: Right  Prep: Maximum Sterile Barrier Precautions used, chloraprep       Needles:  Injection technique: Single-shot  Needle Type: Echogenic Stimulator Needle     Needle Length: 9cm  Needle Gauge: 22     Additional Needles:   Procedures:,,,, ultrasound used (permanent image in chart),,,,  Narrative:  Start time: 02/23/2020 9:00 AM End time: 02/23/2020 9:10 AM Injection made incrementally with aspirations every 5 mL.  Performed by: Personally  Anesthesiologist: Elmer Picker, MD  Additional Notes: Monitors applied. No increased pain on injection. No increased resistance to injection. Injection made in 5cc increments. Good needle visualization. Patient tolerated procedure well.

## 2020-02-23 NOTE — H&P (Signed)
PREOPERATIVE H&P  Chief Complaint: endstage right knee degenerative joint disease  HPI: Terri Estrada is a 69 y.o. female who presents for surgical treatment of endstage right knee degenerative joint disease.  She denies any changes in medical history.  Past Medical History:  Diagnosis Date  . Arthritis   . CAD in native artery    a. MI 2001 s/p PTCA/stenting to mid-distal junction of RCA - residual dx treated medically.  . Diverticulosis of colon (without mention of hemorrhage)   . Essential hypertension, benign   . MI (myocardial infarction) (HCC)   . Mild pulmonary hypertension (HCC)   . Obesity, unspecified   . Onychia and paronychia of toe   . Osteoarthrosis, unspecified whether generalized or localized, unspecified site   . Other and unspecified hyperlipidemia   . Sickle-cell trait (HCC)   . Sinus bradycardia    a. HR 40s even on low dose metoprolol - BB stopped with resolution.  . Streptococcal sore throat   . Type II or unspecified type diabetes mellitus without mention of complication, not stated as uncontrolled    Past Surgical History:  Procedure Laterality Date  . ANGIOPLASTY  09/21/00  . CARDIAC CATHETERIZATION     Social History   Socioeconomic History  . Marital status: Married    Spouse name: Not on file  . Number of children: 2  . Years of education: Not on file  . Highest education level: Not on file  Occupational History  . Occupation: DAY Associate Professor: SELF-EMPLOYED  Tobacco Use  . Smoking status: Former Games developer  . Smokeless tobacco: Never Used  . Tobacco comment: quit nov 2001  Substance and Sexual Activity  . Alcohol use: No  . Drug use: No  . Sexual activity: Yes    Birth control/protection: Post-menopausal  Other Topics Concern  . Not on file  Social History Narrative   .soc   Social Determinants of Health   Financial Resource Strain:   . Difficulty of Paying Living Expenses:   Food Insecurity:   . Worried About  Programme researcher, broadcasting/film/video in the Last Year:   . Barista in the Last Year:   Transportation Needs:   . Freight forwarder (Medical):   Marland Kitchen Lack of Transportation (Non-Medical):   Physical Activity:   . Days of Exercise per Week:   . Minutes of Exercise per Session:   Stress:   . Feeling of Stress :   Social Connections:   . Frequency of Communication with Friends and Family:   . Frequency of Social Gatherings with Friends and Family:   . Attends Religious Services:   . Active Member of Clubs or Organizations:   . Attends Banker Meetings:   Marland Kitchen Marital Status:    Family History  Problem Relation Age of Onset  . Sickle cell anemia Daughter   . Hypertension Mother   . Diabetes type II Mother   . Diabetes type II Sister   . Breast cancer Sister    Allergies  Allergen Reactions  . Ace Inhibitors     REACTION: cough  . Shellfish Allergy Swelling  . Zocor [Simvastatin] Other (See Comments)    Muscle aches (severe) in legs; has taken Lipitor without any issues   Prior to Admission medications   Medication Sig Start Date End Date Taking? Authorizing Provider  acetaminophen (TYLENOL) 650 MG CR tablet Take 1,300 mg by mouth in the morning and at bedtime.  Yes [provider]  amLODipine (NORVASC) 10 MG tablet TAKE ONE TABLET BY MOUTH DAILY Patient taking differently: Take 10 mg by mouth daily.  07/29/19  Yes Dorothy Spark, MD  aspirin EC 81 MG tablet Take 1 tablet (81 mg total) by mouth daily. 10/06/15  Yes Dorothy Spark, MD  aspirin EC 81 MG tablet Take 1 tablet (81 mg total) by mouth 2 (two) times daily. 02/22/20  Yes Leandrew Koyanagi, MD  atorvastatin (LIPITOR) 80 MG tablet TAKE ONE TABLET BY MOUTH DAILY Patient taking differently: Take 80 mg by mouth daily.  09/24/19  Yes Dunn, Dayna N, PA-C  Calcium Carbonate (CALCIUM 600) 1500 MG TABS Take 600 mg by mouth 2 (two) times daily.    Yes [provider]  cetirizine (ZYRTEC) 10 MG tablet Take 1  tablet (10 mg total) by mouth daily. 12/19/19  Yes Wilber Oliphant, MD  cholecalciferol (VITAMIN D3) 25 MCG (1000 UNIT) tablet Take 1,000 Units by mouth daily.   Yes [provider]  hydrochlorothiazide (HYDRODIURIL) 25 MG tablet TAKE ONE TABLET BY MOUTH DAILY Patient taking differently: Take 25 mg by mouth daily.  09/24/19  Yes Dunn, Dayna N, PA-C  isosorbide mononitrate (IMDUR) 30 MG 24 hr tablet TAKE ONE TABLET BY MOUTH DAILY Patient taking differently: Take 30 mg by mouth daily. TAKE ONE TABLET BY MOUTH DAILY 09/24/19  Yes Dunn, Dayna N, PA-C  losartan (COZAAR) 25 MG tablet TAKE ONE TABLET BY MOUTH DAILY Patient taking differently: Take 25 mg by mouth daily.  07/17/19  Yes Dorothy Spark, MD  Multiple Vitamins-Minerals (MULTIVITAMINS THER. W/MINERALS) TABS Take 1 tablet by mouth daily.     Yes [provider]  Omega-3 Fatty Acids (FISH OIL) 1200 MG CAPS Take 1 capsule by mouth 2 (two) times daily.     Yes [provider]  methocarbamol (ROBAXIN) 500 MG tablet Take 1 tablet (500 mg total) by mouth every 6 (six) hours as needed for muscle spasms. 02/22/20   Leandrew Koyanagi, MD  ondansetron (ZOFRAN) 4 MG tablet Take 1-2 tablets (4-8 mg total) by mouth every 8 (eight) hours as needed for nausea or vomiting. 02/22/20   Leandrew Koyanagi, MD  oxyCODONE (OXY IR/ROXICODONE) 5 MG immediate release tablet Take 1-2 tablets (5-10 mg total) by mouth every 8 (eight) hours as needed for severe pain. 02/22/20   Leandrew Koyanagi, MD  oxyCODONE (OXYCONTIN) 10 mg 12 hr tablet Take 1 tablet (10 mg total) by mouth every 12 (twelve) hours for 3 days. 02/22/20 02/25/20  Leandrew Koyanagi, MD  sulfamethoxazole-trimethoprim (BACTRIM DS) 800-160 MG tablet Take 1 tablet by mouth 2 (two) times daily. 02/22/20   Leandrew Koyanagi, MD     Positive ROS: All other systems have been reviewed and were otherwise negative with the exception of those mentioned in the HPI and as above.  Physical Exam: General: Alert, no  acute distress Cardiovascular: No pedal edema Respiratory: No cyanosis, no use of accessory musculature GI: abdomen soft Skin: No lesions in the area of chief complaint Neurologic: Sensation intact distally Psychiatric: Patient is competent for consent with normal mood and affect Lymphatic: no lymphedema  MUSCULOSKELETAL: exam stable  Assessment: endstage right knee degenerative joint disease  Plan: Plan for Procedure(s): RIGHT TOTAL KNEE ARTHROPLASTY  The risks benefits and alternatives were discussed with the patient including but not limited to the risks of nonoperative treatment, versus surgical intervention including infection, bleeding, nerve injury,  blood clots, cardiopulmonary complications,  morbidity, mortality, among others, and they were willing to proceed.   Preoperative templating of the joint replacement has been completed, documented, and submitted to the Operating Room personnel in order to optimize intra-operative equipment management.   Patient's anticipated LOS is less than 2 midnights, meeting these requirements: - Younger than 48 - Lives within 1 hour of care - Has a competent adult at home to recover with post-op recover - NO history of  - Chronic pain requiring opiods  - Diabetes  - Coronary Artery Disease  - Heart failure  - Heart attack  - Stroke  - DVT/VTE  - Cardiac arrhythmia  - Respiratory Failure/COPD  - Renal failure  - Anemia  - Advanced Liver disease    Glee Arvin, MD   02/23/2020 8:59 AM

## 2020-02-23 NOTE — Anesthesia Postprocedure Evaluation (Signed)
Anesthesia Post Note  Patient: Terri Estrada  Procedure(s) Performed: RIGHT TOTAL KNEE ARTHROPLASTY (Right Knee)     Patient location during evaluation: PACU Anesthesia Type: Regional and General Level of consciousness: awake and alert Pain management: pain level controlled Vital Signs Assessment: post-procedure vital signs reviewed and stable Respiratory status: spontaneous breathing, nonlabored ventilation, respiratory function stable and patient connected to nasal cannula oxygen Cardiovascular status: blood pressure returned to baseline and stable Postop Assessment: no apparent nausea or vomiting Anesthetic complications: no    Last Vitals:  Vitals:   02/23/20 1325 02/23/20 1402  BP: 124/76 121/78  Pulse: 71 65  Resp:  17  Temp: (!) 36.4 C (!) 36.4 C  SpO2: 98% 98%    Last Pain:  Vitals:   02/23/20 1402  TempSrc: Oral  PainSc:                  Shane Badeaux L Jaretssi Kraker

## 2020-02-23 NOTE — Op Note (Addendum)
Total Knee Arthroplasty Procedure Note  Preoperative diagnosis: Right knee osteoarthritis  Postoperative diagnosis:same  Operative procedure: Right total knee arthroplasty. CPT [phone]  Surgeon: N. Eduard Roux, MD  Assist: April Green, RNFA  Anesthesia: Spinal, regional, local  Tourniquet time: see anesthesia record  Implants used: Zimmer persona Femur: CR 8 Tibia: E Patella: 32 mm Polyethylene: 12 mm, MC  Indication: Genessa Beman is a 69 y.o. year old female with a history of knee pain. Having failed conservative management, the patient elected to proceed with a total knee arthroplasty.  We have reviewed the risk and benefits of the surgery and they elected to proceed after voicing understanding.  Procedure:  After informed consent was obtained and understanding of the risk were voiced including but not limited to bleeding, infection, damage to surrounding structures including nerves and vessels, blood clots, leg length inequality and the failure to achieve desired results, the operative extremity was marked with verbal confirmation of the patient in the holding area.   The patient was then brought to the operating room and transported to the operating room table in the supine position.  A tourniquet was applied to the operative extremity around the upper thigh. The operative limb was then prepped and draped in the usual sterile fashion and preoperative antibiotics were administered.  A time out was performed prior to the start of surgery confirming the correct extremity, preoperative antibiotic administration, as well as team members, implants and instruments available for the case. Correct surgical site was also confirmed with preoperative radiographs. The limb was then elevated for exsanguination and the tourniquet was inflated. A midline incision was made and a standard medial parapatellar approach was performed.  The infrapatellar fat pad was removed.  Suprapatellar  synovium was removed to reveal the anterior distal femoral cortex.  A medial peel was performed to release the capsule of the medial tibial plateau.  The patella was then everted and was prepared and sized to a 32 mm.  A cover was placed on the patella for protection from retractors.  The knee was then brought into full flexion and we then turned our attention to the femur.  The cruciates were sacrificed.  Start site was drilled in the femur and the intramedullary distal femoral cutting guide was placed, set at 5 degrees valgus, taking 10 mm of distal resection. The distal cut was made. Osteophytes were then removed.  Next, the proximal tibial cutting guide was placed with appropriate slope, varus/valgus alignment and depth of resection. The proximal tibial cut was made. Gap blocks were then used to assess the extension gap and alignment, and appropriate soft tissue releases were performed. Attention was turned back to the femur, which was sized using the sizing guide to a size 8. Appropriate rotation of the femoral component was determined using epicondylar axis, Whiteside's line, and assessing the flexion gap under ligament tension. The appropriate size 4-in-1 cutting block was placed and checked with an angel wing and cuts were made. Posterior femoral osteophytes and uncapped bone were then removed with the curved osteotome.  Trial components were placed, and stability was checked in full extension, mid-flexion, and deep flexion. Proper tibial rotation was determined and marked.  The patella tracked well without a lateral release.  The femoral lugs were then drilled. Trial components were then removed and tibial preparation performed.  The tibia was sized for a size E component.  A posterior capsular injection comprising of 20 cc of 1.3% exparel, 20 cc of 0.25% bupivicaine with  epi and 20 cc of normal saline was performed for postoperative pain control. The bony surfaces were irrigated with a pulse lavage and  then dried. Bone cement was vacuum mixed on the back table, and the final components sized above were cemented into place. After cement had finished curing, excess cement was removed. The stability of the construct was re-evaluated throughout a range of motion and found to be acceptable. The trial liner was removed, the knee was copiously irrigated, and the knee was re-evaluated for any excess bone debris. The real polyethylene liner, 12 mm thick, was inserted and checked to ensure the locking mechanism had engaged appropriately. The tourniquet was deflated and hemostasis was achieved. The wound was irrigated with normal saline.  One gram of vancomycin powder was placed in the surgical bed.  Capsular closure was performed with a #1 vicryl, subcutaneous fat closed with a 0 vicryl suture, then subcutaneous tissue closed with interrupted 2.0 vicryl suture. The skin was then closed with a 2.0 nylon and dermabond. A sterile dressing was applied.  The patient was awakened in the operating room and taken to recovery in stable condition. All sponge, needle, and instrument counts were correct at the end of the case.  Position: supine  Complications: none.  Time Out: performed   Drains/Packing: none  Estimated blood loss: minimal  Returned to Recovery Room: in good condition.   Antibiotics: yes   Mechanical VTE (DVT) Prophylaxis: sequential compression devices, TED thigh-high  Chemical VTE (DVT) Prophylaxis: aspirin  Fluid Replacement  Crystalloid: see anesthesia record Blood: none  FFP: none   Specimens Removed: 1 to pathology   Sponge and Instrument Count Correct? yes   PACU: portable radiograph - knee AP and Lateral   Plan/RTC: Return in 2 weeks for wound check.   Weight Bearing/Load Lower Extremity: full   N. Glee Arvin, MD [facility] 11:17 AM

## 2020-02-23 NOTE — Plan of Care (Signed)

## 2020-02-23 NOTE — TOC Initial Note (Signed)
Transition of Care [facility]) - Initial/Assessment Note    Patient Details  Name: Terri Estrada MRN: [id number] Date of Birth: 06-21-1951  [facility] CM/SW Contact:    Curlene Labrum, RN Phone Number: 02/23/2020, 4:13 PM  Clinical Narrative:                 Case management met with the patient and husband after S/P Right knee arthroplasty by Dr. Erlinda Hong.  Patient's spouse given Medicare choice regarding home health services.  Patient's spouse chose 1. [facility], 2. [facility], and 3. [facility].  [facility] and [facility] [place] unable to support [facility] services due to insurance being out of network with these companies.  [facility] is going to be providing [facility] as ordered.  Will place this in the discharge instructions.  Expected Discharge Plan: [place]     Patient Goals and CMS Choice Patient states their goals for this hospitalization and ongoing recovery are:: I'm ready to get better. CMS Medicare.gov Compare Post Acute Care list provided to:: Patient Choice offered to / list presented to : Spouse  Expected Discharge Plan and Services Expected Discharge Plan: [place] In-house Referral: NA Discharge Planning Services: CM Consult   Living arrangements for the past 2 months: Single Family Home                           HH Arranged: PT [facility] Agency: [facility] Date [place]: 02/23/20 Time [place]: 1612 Representative spoke with at [place]: [place], [place]  Prior Living Arrangements/Services Living arrangements for the past 2 months: [place] with:: Spouse Patient language and need for interpreter reviewed:: Yes Do you feel safe going back to the place where you live?: Yes      Need for Family Participation in Patient Care: Yes (Comment) Care giver support system in place?: Yes (comment) Current home services: DME(currently has RW, CPM, and 3:1 at home.) Criminal Activity/Legal Involvement Pertinent  to Current Situation/Hospitalization: No - Comment as needed  Activities of Daily Living      Permission Sought/Granted Permission sought to share information with : Case Manager       Permission granted to share info w AGENCY: [place] granted to share info w Relationship: husband     Emotional Assessment Appearance:: Appears stated age Attitude/Demeanor/Rapport: Engaged Affect (typically observed): Accepting Orientation: : Oriented to Self, Oriented to Place, Oriented to  Time, Oriented to Situation Alcohol / Substance Use: Not Applicable Psych Involvement: No (comment)  Admission diagnosis:  Status post total right knee replacement [Z96.651] Patient Active Problem List   Diagnosis Date Noted  . Status post total right knee replacement 02/23/2020  . Primary osteoarthritis of right knee 02/22/2020  . Acute seasonal allergic rhinitis 12/19/2019  . Pre-op evaluation 10/23/2019  . Murmur 12/10/2015  . Pes planus of both feet 01/07/2015  . Depression 01/07/2015  . Female cystocele 06/26/2014  . Healthcare maintenance 12/17/2012  . Osteoarthritis 06/21/2010  . Hyperlipidemia 05/31/2009  . DIVERTICULOSIS, COLON 10/16/2008  . AORTIC STENOSIS, MILD 06/02/2008  . Diabetes mellitus type 2, controlled, without complications ([place]) 01/75/1025  . Obesity (BMI 30-39.9) 12/06/2006  . SICKLE CELL TRAIT 12/06/2006  . HYPERTENSION, BENIGN SYSTEMIC 12/06/2006  . CORONARY, ARTERIOSCLEROSIS 12/06/2006   PCP:  Stark Klein, MD Pharmacy:   [facility] [address], [place]  [place] [address] Phone: [phone] Fax: [phone]     Social Determinants of Health (SDOH) Interventions    Readmission Risk Interventions No flowsheet data found.

## 2020-02-23 NOTE — Transfer of Care (Signed)
Immediate Anesthesia Transfer of Care Note  Patient: Terri Estrada  Procedure(s) Performed: RIGHT TOTAL KNEE ARTHROPLASTY (Right Knee)  Patient Location: PACU  Anesthesia Type:General  Level of Consciousness: awake, alert  and oriented  Airway & Oxygen Therapy: Patient Spontanous Breathing and Patient connected to nasal cannula oxygen  Post-op Assessment: Report given to RN, Post -op Vital signs reviewed and stable and Patient moving all extremities X 4  Post vital signs: Reviewed and stable  Last Vitals:  Vitals Value Taken Time  BP    Temp    Pulse    Resp 15 02/23/20 1153  SpO2    Vitals shown include unvalidated device data.  Last Pain:  Vitals:   02/23/20 0810  TempSrc: Oral         Complications: No apparent anesthesia complications

## 2020-02-23 NOTE — Anesthesia Procedure Notes (Signed)
Procedure Name: LMA Insertion Date/Time: 02/23/2020 9:46 AM Performed by: Carmela Rima, CRNA Pre-anesthesia Checklist: Timeout performed, Patient being monitored, Suction available, Emergency Drugs available and Patient identified Patient Re-evaluated:Patient Re-evaluated prior to induction Oxygen Delivery Method: Circle system utilized Preoxygenation: Pre-oxygenation with 100% oxygen Induction Type: IV induction Ventilation: Mask ventilation without difficulty LMA: LMA inserted LMA Size: 4.0 Number of attempts: 1 Placement Confirmation: positive ETCO2 and breath sounds checked- equal and bilateral Tube secured with: Tape Dental Injury: Teeth and Oropharynx as per pre-operative assessment

## 2020-02-23 NOTE — Progress Notes (Signed)
Orthopedic Tech Progress Note Patient Details:  Terri Estrada June 28, 1951 [id number] Patient couldn't make it to 90 degrees so she got up to 60. CPM Right Knee Right Knee Flexion (Degrees): 0 Right Knee Extension (Degrees): 60 Additional Comments: added fresh ice  Post Interventions Patient Tolerated: Well Instructions Provided: Care of device  Donald Pore 02/23/2020, 2:39 PM

## 2020-02-23 NOTE — Telephone Encounter (Signed)
Devan, pharmacists at [facility] would like clarification for Oxycodone.  Cb# [phone].  Please advise.  Thank you.

## 2020-02-23 NOTE — Care Management Important Message (Signed)
Important Message  Patient Details  Name: Terri Estrada MRN: [id number] Date of Birth: October 18, 1950   Medicare Important Message Given:  Yes     Janae Bridgeman, RN 02/23/2020, 4:20 PM

## 2020-02-23 NOTE — Evaluation (Signed)
Physical Therapy Evaluation Patient Details Name: Terri Estrada MRN: [id number] DOB: December 30, 1950 Today's Date: 02/23/2020   History of Present Illness  Pt is a 69 y/o female s/p R TKA. PMH includes CAD, MI, sickle cell trait, DM, and HTN.   Clinical Impression  Pt is s/p surgery above with deficits below. Requiring min to min guard for mobility tasks within the room using RW. Overall tolerated well. Reviewed knee precautions with pt. Will continue to follow acutely to maximize functional mobility independence and safety.     Follow Up Recommendations Follow surgeon's recommendation for DC plan and follow-up therapies;Supervision for mobility/OOB    Equipment Recommendations  None recommended by PT    Recommendations for Other Services       Precautions / Restrictions Precautions Precautions: Knee Precaution Booklet Issued: No Precaution Comments: reviewed knee precautions with pt.  Restrictions Weight Bearing Restrictions: Yes RLE Weight Bearing: Weight bearing as tolerated      Mobility  Bed Mobility Overal bed mobility: Needs Assistance Bed Mobility: Sit to Supine       Sit to supine: Supervision;HOB elevated   General bed mobility comments: Supervision for safety.   Transfers Overall transfer level: Needs assistance Equipment used: Rolling walker (2 wheeled) Transfers: Sit to/from [employer] Sit to Stand: Min assist Stand pivot transfers: Min guard       General transfer comment: Min A for lift assist. Cues for safe hand placement. Min guard for safety to perform stand pivot transfer to [facility].   Ambulation/Gait Ambulation/Gait assistance: Min guard Gait Distance (Feet): 15 Feet Assistive device: Rolling walker (2 wheeled) Gait Pattern/deviations: Step-to pattern;Decreased step length - right;Decreased step length - left;Decreased weight shift to right;Antalgic Gait velocity: Decreased   General Gait Details: Slow, mildly antalgic gait.  Cues for sequencing using Rw.   Stairs            Wheelchair Mobility    Modified Rankin (Stroke Patients Only)       Balance Overall balance assessment: Needs assistance Sitting-balance support: No upper extremity supported;Feet supported Sitting balance-Leahy Scale: Good     Standing balance support: Bilateral upper extremity supported;During functional activity Standing balance-Leahy Scale: Poor Standing balance comment: Reliant on BUE support                              Pertinent Vitals/Pain Pain Assessment: Faces Faces Pain Scale: Hurts even more Pain Location: R knee  Pain Descriptors / Indicators: Aching;Operative site guarding Pain Intervention(s): Limited activity within patient's tolerance;Monitored during session;Repositioned    Home Living Family/patient expects to be discharged to:: Private residence Living Arrangements: Alone Available Help at Discharge: Family;Available PRN/intermittently Type of Home: House Home Access: Stairs to enter Entrance Stairs-Rails: Right Entrance Stairs-Number of Steps: 5 Home Layout: One level Home Equipment: Walker - 2 wheels;Bedside commode;Cane - single point      Prior Function Level of Independence: Independent with assistive device(s)         Comments: Reports occasionally using cane      Hand Dominance        Extremity/Trunk Assessment   Upper Extremity Assessment Upper Extremity Assessment: Overall WFL for tasks assessed    Lower Extremity Assessment Lower Extremity Assessment: RLE deficits/detail RLE Deficits / Details: Deficits consistent with post op pain and weakness.     Cervical / Trunk Assessment Cervical / Trunk Assessment: Normal  Communication   Communication: No difficulties  Cognition Arousal/Alertness: Awake/alert Behavior During  Therapy: WFL for tasks assessed/performed Overall Cognitive Status: Within Functional Limits for tasks assessed                                         General Comments      Exercises Total Joint Exercises Ankle Circles/Pumps: AROM;Both;5 reps   Assessment/Plan    PT Assessment Patient needs continued PT services  PT Problem List Decreased strength;Decreased range of motion;Decreased activity tolerance;Decreased balance;Decreased mobility;Decreased knowledge of use of DME;Decreased knowledge of precautions;Pain       PT Treatment Interventions DME instruction;Gait training;Functional mobility training;Therapeutic activities;Stair training;Therapeutic exercise;Balance training;Patient/family education    PT Goals (Current goals can be found in the Care Plan section)  Acute Rehab PT Goals Patient Stated Goal: to go home PT Goal Formulation: With patient Time For Goal Achievement: 03/08/20 Potential to Achieve Goals: Good    Frequency 7X/week   Barriers to discharge        Co-evaluation               AM-PAC PT "6 Clicks" Mobility  Outcome Measure Help needed turning from your back to your side while in a flat bed without using bedrails?: None Help needed moving from lying on your back to sitting on the side of a flat bed without using bedrails?: A Little Help needed moving to and from a bed to a chair (including a wheelchair)?: A Little Help needed standing up from a chair using your arms (e.g., wheelchair or bedside chair)?: A Little Help needed to walk in hospital room?: A Little Help needed climbing 3-5 steps with a railing? : A Lot 6 Click Score: 18    End of Session Equipment Utilized During Treatment: Gait belt Activity Tolerance: Patient tolerated treatment well Patient left: in chair;with call bell/phone within reach;with chair alarm set Nurse Communication: Mobility status PT Visit Diagnosis: Other abnormalities of gait and mobility (R26.89);Pain Pain - Right/Left: Right Pain - part of body: Knee    Time: 6378-5885 PT Time Calculation (min) (ACUTE ONLY): 27  min   Charges:   PT Evaluation $PT Eval Low Complexity: 1 Low PT Treatments $Therapeutic Activity: 8-22 mins        Lou Miner, DPT  Acute Rehabilitation Services  Pager: [phone] Office: [phone]   Terri Estrada 02/23/2020, 5:05 PM

## 2020-02-24 ENCOUNTER — Encounter: Payer: Self-pay | Admitting: *Deleted

## 2020-02-24 DIAGNOSIS — E119 Type 2 diabetes mellitus without complications: Secondary | ICD-10-CM | POA: Diagnosis not present

## 2020-02-24 DIAGNOSIS — E785 Hyperlipidemia, unspecified: Secondary | ICD-10-CM | POA: Diagnosis not present

## 2020-02-24 DIAGNOSIS — D571 Sickle-cell disease without crisis: Secondary | ICD-10-CM | POA: Diagnosis not present

## 2020-02-24 DIAGNOSIS — R2689 Other abnormalities of gait and mobility: Secondary | ICD-10-CM | POA: Diagnosis not present

## 2020-02-24 DIAGNOSIS — E669 Obesity, unspecified: Secondary | ICD-10-CM | POA: Diagnosis not present

## 2020-02-24 DIAGNOSIS — I110 Hypertensive heart disease with heart failure: Secondary | ICD-10-CM | POA: Diagnosis not present

## 2020-02-24 DIAGNOSIS — M1711 Unilateral primary osteoarthritis, right knee: Secondary | ICD-10-CM | POA: Diagnosis not present

## 2020-02-24 DIAGNOSIS — Z87891 Personal history of nicotine dependence: Secondary | ICD-10-CM | POA: Diagnosis not present

## 2020-02-24 DIAGNOSIS — J449 Chronic obstructive pulmonary disease, unspecified: Secondary | ICD-10-CM | POA: Diagnosis not present

## 2020-02-24 DIAGNOSIS — I509 Heart failure, unspecified: Secondary | ICD-10-CM | POA: Diagnosis not present

## 2020-02-24 LAB — BASIC METABOLIC PANEL
Anion gap: 9 (ref 5–15)
BUN: 11 mg/dL (ref 8–23)
CO2: 27 mmol/L (ref 22–32)
Calcium: 9.1 mg/dL (ref 8.9–10.3)
Chloride: 103 mmol/L (ref 98–111)
Creatinine, Ser: 0.69 mg/dL (ref 0.44–1.00)
GFR calc Af Amer: >60 mL/min (ref >60)
GFR calc non Af Amer: >60 mL/min (ref >60)
Glucose, Bld: 127 mg/dL — ABNORMAL HIGH (ref 70–99)
Potassium: 4.3 mmol/L (ref 3.5–5.1)
Sodium: 139 mmol/L (ref 135–145)

## 2020-02-24 LAB — CBC
HCT: 32.8 % — ABNORMAL LOW (ref 36.0–46.0)
Hemoglobin: 11.5 g/dL — ABNORMAL LOW (ref 12.0–15.0)
MCH: 31.7 pg (ref 26.0–34.0)
MCHC: 35.1 g/dL (ref 30.0–36.0)
MCV: 90.4 fL (ref 80.0–100.0)
Platelets: 265 10*3/uL (ref 150–400)
RBC: 3.63 MIL/uL — ABNORMAL LOW (ref 3.87–5.11)
RDW: 12.5 % (ref 11.5–15.5)
WBC: 7.8 10*3/uL (ref 4.0–10.5)
nRBC: 0 % (ref 0.0–0.2)

## 2020-02-24 LAB — HEMOGLOBIN A1C
Hgb A1c MFr Bld: 6.1 % — ABNORMAL HIGH (ref 4.8–5.6)
Mean Plasma Glucose: 128 mg/dL

## 2020-02-24 LAB — GLUCOSE, CAPILLARY
Glucose-Capillary: 133 mg/dL — ABNORMAL HIGH (ref 70–99)
Glucose-Capillary: 123 mg/dL — ABNORMAL HIGH (ref 70–99)

## 2020-02-24 NOTE — Plan of Care (Signed)

## 2020-02-24 NOTE — Care Management Important Message (Signed)
Important Message  Patient Details  Name: Nicholas Ossa MRN: [id number] Date of Birth: 1951/05/06   Medicare Important Message Given:  Yes     Dorena Bodo 02/24/2020, 2:27 PM

## 2020-02-24 NOTE — Plan of Care (Signed)
  Problem: Education: Goal: Knowledge of General Education information will improve Description: Including pain rating scale, medication(s)/side effects and non-pharmacologic comfort measures 02/24/2020 1050 by Loma Sousa, RN Outcome: Adequate for Discharge 02/24/2020 0737 by Loma Sousa, RN Outcome: Progressing   Problem: Health Behavior/Discharge Planning: Goal: Ability to manage health-related needs will improve 02/24/2020 1050 by Loma Sousa, RN Outcome: Adequate for Discharge 02/24/2020 0737 by Loma Sousa, RN Outcome: Progressing   Problem: Clinical Measurements: Goal: Ability to maintain clinical measurements within normal limits will improve Outcome: Adequate for Discharge Goal: Will remain free from infection Outcome: Adequate for Discharge Goal: Diagnostic test results will improve Outcome: Adequate for Discharge Goal: Respiratory complications will improve Outcome: Adequate for Discharge Goal: Cardiovascular complication will be avoided Outcome: Adequate for Discharge   Problem: Activity: Goal: Risk for activity intolerance will decrease Outcome: Adequate for Discharge   Problem: Nutrition: Goal: Adequate nutrition will be maintained Outcome: Adequate for Discharge   Problem: Coping: Goal: Level of anxiety will decrease Outcome: Adequate for Discharge   Problem: Elimination: Goal: Will not experience complications related to bowel motility Outcome: Adequate for Discharge Goal: Will not experience complications related to urinary retention Outcome: Adequate for Discharge   Problem: Pain Managment: Goal: General experience of comfort will improve 02/24/2020 1050 by Loma Sousa, RN Outcome: Adequate for Discharge 02/24/2020 0737 by Loma Sousa, RN Outcome: Progressing   Problem: Safety: Goal: Ability to remain free from injury will improve 02/24/2020 1050 by Loma Sousa, RN Outcome: Adequate for  Discharge 02/24/2020 0737 by Loma Sousa, RN Outcome: Progressing   Problem: Skin Integrity: Goal: Risk for impaired skin integrity will decrease Outcome: Adequate for Discharge

## 2020-02-24 NOTE — Progress Notes (Signed)
   Subjective:  Patient reports pain as mild.   Doing PT session this morning.  No complaints.  Objective:   VITALS:   Vitals:   02/23/20 1927 02/23/20 2348 02/24/20 0341 02/24/20 0758  BP: 140/88 122/61 103/75 125/78  Pulse: 83 69 68 84  Resp: 17 16 17 17   Temp: 97.7 F (36.5 C) 98.6 F (37 C) 98.3 F (36.8 C) 99.1 F (37.3 C)  TempSrc: Oral Oral Oral Oral  SpO2: 98% 95% 95% 100%  Weight:      Height:        Neurovascular intact Sensation intact distally Intact pulses distally Dorsiflexion/Plantar flexion intact Incision: dressing C/D/I and no drainage   Lab Results  Component Value Date   WBC 7.8 02/24/2020   HGB 11.5 (L) 02/24/2020   HCT 32.8 (L) 02/24/2020   MCV 90.4 02/24/2020   PLT 265 02/24/2020     Assessment/Plan:  1 Day Post-Op   - Expected postop acute blood loss anemia - will monitor for symptoms - Up with PT/OT - progressing well with PT - DVT ppx - SCDs, ambulation, aspirin - WBAT operative extremity - Pain control - Discharge planning - home today after next PT session   02/26/2020 02/24/2020, 8:11 AM [phone]

## 2020-02-24 NOTE — Progress Notes (Signed)
Physical Therapy Treatment Patient Details Name: Terri Estrada MRN: [id number] DOB: Mar 02, 1951 Today's Date: 02/24/2020    History of Present Illness Pt is a 69 y/o female s/p R TKA. PMH includes CAD, MI, sickle cell trait, DM, and HTN.     PT Comments    Pt very pleasant in CPM to 70degrees on arrival and requested return to CPM end of session able to achieve 90degrees without pain. Pt with excellent progression in transfers, gait and HEP able to ambulate entire unit and perform stairs. Pt aware of exercises for home and CPM use. Pt safe for D/C.     Follow Up Recommendations  Follow surgeon's recommendation for DC plan and follow-up therapies;Supervision for mobility/OOB     Equipment Recommendations  None recommended by PT    Recommendations for Other Services       Precautions / Restrictions Precautions Precautions: Knee Precaution Comments: reviewed knee precautions with pt.  Restrictions Weight Bearing Restrictions: Yes RLE Weight Bearing: Weight bearing as tolerated    Mobility  Bed Mobility Overal bed mobility: Modified Independent Bed Mobility: Supine to Sit;Sit to Supine           General bed mobility comments: pt hooking RLE with LLE to exit bed, no hooking or assist to return to bed  Transfers Overall transfer level: Modified independent   Transfers: Sit to/from Stand Sit to Stand: Supervision         General transfer comment: cues for hand placement  Ambulation/Gait Ambulation/Gait assistance: Supervision Gait Distance (Feet): 500 Feet Assistive device: Rolling walker (2 wheeled) Gait Pattern/deviations: Step-through pattern   Gait velocity interpretation: >2.62 ft/sec, indicative of community ambulatory General Gait Details: pt with good stride, balance and position in RW. cues for direction   Stairs Stairs: Yes Stairs assistance: Supervision Stair Management: Forwards;One rail Right;Sideways;Step to pattern Number of Stairs:  4 General stair comments: ascend forward, descend sideways with cues for sequence   Wheelchair Mobility    Modified Rankin (Stroke Patients Only)       Balance Overall balance assessment: No apparent balance deficits (not formally assessed)                                          Cognition Arousal/Alertness: Awake/alert Behavior During Therapy: WFL for tasks assessed/performed Overall Cognitive Status: Within Functional Limits for tasks assessed                                        Exercises Total Joint Exercises Heel Slides: AROM;Right;Supine;15 reps Hip ABduction/ADduction: AROM;Right;Supine;10 reps Straight Leg Raises: AROM;Right;Supine;15 reps Long Arc Quad: AROM;Right;Seated;15 reps Goniometric ROM: 0-80 Marching in Standing: AROM;Right;Seated;15 reps    General Comments        Pertinent Vitals/Pain Pain Assessment: No/denies pain    Home Living                      Prior Function            PT Goals (current goals can now be found in the care plan section) Progress towards PT goals: Progressing toward goals    Frequency    7X/week      PT Plan Current plan remains appropriate    Co-evaluation  AM-PAC PT "6 Clicks" Mobility   Outcome Measure  Help needed turning from your back to your side while in a flat bed without using bedrails?: None Help needed moving from lying on your back to sitting on the side of a flat bed without using bedrails?: None Help needed moving to and from a bed to a chair (including a wheelchair)?: None Help needed standing up from a chair using your arms (e.g., wheelchair or bedside chair)?: A Little Help needed to walk in hospital room?: A Little Help needed climbing 3-5 steps with a railing? : A Little 6 Click Score: 21    End of Session Equipment Utilized During Treatment: Gait belt Activity Tolerance: Patient tolerated treatment well Patient left: in  bed;with call bell/phone within reach Nurse Communication: Mobility status PT Visit Diagnosis: Other abnormalities of gait and mobility (R26.89);Pain     Time: 6435-3912 PT Time Calculation (min) (ACUTE ONLY): 30 min  Charges:  $Gait Training: 8-22 mins $Therapeutic Exercise: 8-22 mins                     Rondle Lohse P, PT Acute Rehabilitation Services Pager: [phone] Office: [phone]    Praneeth Bussey B Jayshon Dommer 02/24/2020, 11:25 AM

## 2020-02-24 NOTE — Progress Notes (Signed)
Physical Therapy Treatment Patient Details Name: Terri Estrada MRN: [id number] DOB: 12-Apr-1951 Today's Date: 02/24/2020    History of Present Illness Pt is a 69 y/o female s/p R TKA. PMH includes CAD, MI, sickle cell trait, DM, and HTN.     PT Comments    Pt moving well and very motivated to return home and maintain function. Pt normally walks 3-4 miles a day and will have assist of friend at D/C. Pt educated for precautions, HEP, progression and plan for stairs this afternoon.     Follow Up Recommendations  Follow surgeon's recommendation for DC plan and follow-up therapies;Supervision for mobility/OOB     Equipment Recommendations  None recommended by PT    Recommendations for Other Services       Precautions / Restrictions Precautions Precautions: Knee Precaution Comments: reviewed knee precautions with pt.  Restrictions Weight Bearing Restrictions: Yes RLE Weight Bearing: Weight bearing as tolerated    Mobility  Bed Mobility Overal bed mobility: Modified Independent             General bed mobility comments: pt able to pivot to EOB with HOB 20 degrees without assist  Transfers Overall transfer level: Needs assistance   Transfers: Sit to/from Stand Sit to Stand: Supervision         General transfer comment: cues for hand placement  Ambulation/Gait Ambulation/Gait assistance: Supervision Gait Distance (Feet): 150 Feet Assistive device: Rolling walker (2 wheeled) Gait Pattern/deviations: Step-through pattern;Decreased stride length   Gait velocity interpretation: 1.31 - 2.62 ft/sec, indicative of limited community ambulator General Gait Details: cues for direction and increased stride   Stairs             Wheelchair Mobility    Modified Rankin (Stroke Patients Only)       Balance                                            Cognition Arousal/Alertness: Awake/alert Behavior During Therapy: WFL for tasks  assessed/performed Overall Cognitive Status: Within Functional Limits for tasks assessed                                        Exercises Total Joint Exercises Heel Slides: AROM;Right;Supine;10 reps Hip ABduction/ADduction: AROM;Right;Supine;10 reps Straight Leg Raises: AROM;Right;Supine;10 reps Long Arc Quad: AROM;Right;Seated;10 reps Goniometric ROM: 0-50    General Comments        Pertinent Vitals/Pain Pain Assessment: No/denies pain    Home Living                      Prior Function            PT Goals (current goals can now be found in the care plan section) Progress towards PT goals: Progressing toward goals    Frequency    7X/week      PT Plan Current plan remains appropriate    Co-evaluation              AM-PAC PT "6 Clicks" Mobility   Outcome Measure  Help needed turning from your back to your side while in a flat bed without using bedrails?: None Help needed moving from lying on your back to sitting on the side of a flat bed without using bedrails?: None Help needed moving  to and from a bed to a chair (including a wheelchair)?: A Little Help needed standing up from a chair using your arms (e.g., wheelchair or bedside chair)?: A Little Help needed to walk in hospital room?: A Little Help needed climbing 3-5 steps with a railing? : A Little 6 Click Score: 20    End of Session Equipment Utilized During Treatment: Gait belt Activity Tolerance: Patient tolerated treatment well Patient left: in chair;with call bell/phone within reach;with chair alarm set Nurse Communication: Mobility status PT Visit Diagnosis: Other abnormalities of gait and mobility (R26.89);Pain     Time: 4392-6599 PT Time Calculation (min) (ACUTE ONLY): 20 min  Charges:  $Gait Training: 8-22 mins                     [name], PT Acute Rehabilitation Services Pager: [phone] Office: [phone]    Terri Estrada 02/24/2020, 7:50  AM

## 2020-02-24 NOTE — Progress Notes (Signed)
Transfer to [facility] and back with standby assist and walker- steady-

## 2020-02-24 NOTE — Progress Notes (Signed)
Discharge instructions given, verbalized understanding. Home with spouse. To car via w/c

## 2020-02-24 NOTE — Care Management Obs Status (Signed)
MEDICARE OBSERVATION STATUS NOTIFICATION   Patient Details  Name: Terri Estrada MRN: [id number] Date of Birth: March 11, 1951   Medicare Observation Status Notification Given:  Yes    Janae Bridgeman, RN 02/24/2020, 2:56 PM

## 2020-02-24 NOTE — Progress Notes (Signed)
Orthopedic Tech Progress Note Patient Details:  [name] Dec 29, 1950 [phone] Put patient on CPM added fresh ice to knee and took her to the restroom Patient ID: Terri Estrada, female   DOB: 1951/07/06, 69 y.o.   MRN: [id number]   Donald Pore 02/24/2020, 9:41 AM

## 2020-02-25 ENCOUNTER — Telehealth: Payer: Self-pay | Admitting: Orthopaedic Surgery

## 2020-02-25 MED ORDER — OXYCODONE HCL ER 10 MG PO T12A: 10.0000 mg | EXTENDED_RELEASE_TABLET | Freq: Two times a day (BID) | ORAL | 0 refills | Status: AC

## 2020-02-25 NOTE — Telephone Encounter (Signed)
Patient called requesting a refill of oxycodone. Patient stated that [facility] need a phone call from Dr. Roda Shutters to verify medication. Pharmacy is [facility] on [address] in [place]. Patient is asking for a call  Back for clearance to pick up medication. Patient phone number is [phone]. This message is urgent patient asked.

## 2020-02-25 NOTE — Telephone Encounter (Signed)
Patient called stating that [facility] does not have her RX for the OxyContin 10mg .  She would like for you to send it to the one on [address].  Also patient would like a call back when the RX is sent into the Pharmacy.  CB#[phone].  Thank you.

## 2020-02-25 NOTE — Telephone Encounter (Signed)
Spoke with her

## 2020-02-27 DIAGNOSIS — K5730 Diverticulosis of large intestine without perforation or abscess without bleeding: Secondary | ICD-10-CM | POA: Diagnosis not present

## 2020-02-27 DIAGNOSIS — I272 Pulmonary hypertension, unspecified: Secondary | ICD-10-CM | POA: Diagnosis not present

## 2020-02-27 DIAGNOSIS — I110 Hypertensive heart disease with heart failure: Secondary | ICD-10-CM | POA: Diagnosis not present

## 2020-02-27 DIAGNOSIS — E669 Obesity, unspecified: Secondary | ICD-10-CM | POA: Diagnosis not present

## 2020-02-27 DIAGNOSIS — E785 Hyperlipidemia, unspecified: Secondary | ICD-10-CM | POA: Diagnosis not present

## 2020-02-27 DIAGNOSIS — E119 Type 2 diabetes mellitus without complications: Secondary | ICD-10-CM | POA: Diagnosis not present

## 2020-02-27 DIAGNOSIS — I509 Heart failure, unspecified: Secondary | ICD-10-CM | POA: Diagnosis not present

## 2020-02-27 DIAGNOSIS — M1990 Unspecified osteoarthritis, unspecified site: Secondary | ICD-10-CM | POA: Diagnosis not present

## 2020-02-27 DIAGNOSIS — Z471 Aftercare following joint replacement surgery: Secondary | ICD-10-CM | POA: Diagnosis not present

## 2020-02-27 DIAGNOSIS — I2510 Atherosclerotic heart disease of native coronary artery without angina pectoris: Secondary | ICD-10-CM | POA: Diagnosis not present

## 2020-03-01 NOTE — Discharge Summary (Signed)
Patient ID: Terri Estrada MRN: [id number] DOB/AGE: 06-22-1951 69 y.o.  Admit date: 02/23/2020 Discharge date: 03/01/2020  Admission Diagnoses:  Primary osteoarthritis of right knee  Discharge Diagnoses:  Principal Problem:   Primary osteoarthritis of right knee Active Problems:   Status post total right knee replacement   Past Medical History:  Diagnosis Date  . Arthritis   . CAD in native artery    a. MI 2001 s/p PTCA/stenting to mid-distal junction of RCA - residual dx treated medically.  . Diverticulosis of colon (without mention of hemorrhage)   . Essential hypertension, benign   . MI (myocardial infarction) (HCC)   . Mild pulmonary hypertension (HCC)   . Obesity, unspecified   . Onychia and paronychia of toe   . Osteoarthrosis, unspecified whether generalized or localized, unspecified site   . Other and unspecified hyperlipidemia   . Sickle-cell trait (HCC)   . Sinus bradycardia    a. HR 40s even on low dose metoprolol - BB stopped with resolution.  . Streptococcal sore throat   . Type II or unspecified type diabetes mellitus without mention of complication, not stated as uncontrolled     Surgeries: Procedure(s): RIGHT TOTAL KNEE ARTHROPLASTY on 02/23/2020   Consultants (if any):   Discharged Condition: Improved  Hospital Course: Terri Estrada is an 69 y.o. female who was admitted 02/23/2020 with a diagnosis of Primary osteoarthritis of right knee and went to the operating room on 02/23/2020 and underwent the above named procedures.    She was given perioperative antibiotics:  Anti-infectives (From admission, onward)   Start     Dose/Rate Route Frequency Ordered Stop   02/23/20 1530  ceFAZolin (ANCEF) IVPB 2g/100 mL premix     2 g 200 mL/hr over 30 Minutes Intravenous Every 6 hours 02/23/20 1404 02/24/20 0343   02/23/20 1109  vancomycin (VANCOCIN) powder  Status:  Discontinued       As needed 02/23/20 1129 02/23/20 1146   02/23/20 0830   ceFAZolin (ANCEF) IVPB 2g/100 mL premix     2 g 200 mL/hr over 30 Minutes Intravenous On call to O.R. 02/23/20 5102 02/23/20 0928   02/23/20 0830  ceFAZolin (ANCEF) 2-4 GM/100ML-% IVPB    Note to Pharmacy: Aquilla Hacker   : cabinet override      02/23/20 0830 02/23/20 5852    .  She was given sequential compression devices, early ambulation, and appropriate chemoprophylaxis for DVT prophylaxis.  She benefited maximally from the hospital stay and there were no complications.    Recent vital signs:  Vitals:   02/24/20 0758 02/24/20 1445  BP: 125/78 126/82  Pulse: 84 85  Resp: 17 18  Temp: 99.1 F (37.3 C) 98.2 F (36.8 C)  SpO2: 100% 100%    Recent laboratory studies:  Lab Results  Component Value Date   HGB 11.5 (L) 02/24/2020   HGB 13.1 02/19/2020   HGB 12.7 10/23/2019   Lab Results  Component Value Date   WBC 7.8 02/24/2020   PLT 265 02/24/2020   Lab Results  Component Value Date   INR 1.0 02/19/2020   Lab Results  Component Value Date   NA 139 02/24/2020   K 4.3 02/24/2020   CL 103 02/24/2020   CO2 27 02/24/2020   BUN 11 02/24/2020   CREATININE 0.69 02/24/2020   GLUCOSE 127 (H) 02/24/2020    Discharge Medications:   Allergies as of 02/24/2020      Reactions   Ace Inhibitors  REACTION: cough   Shellfish Allergy Swelling   Zocor [simvastatin] Other (See Comments)   Muscle aches (severe) in legs; has taken Lipitor without any issues      Medication List    TAKE these medications   acetaminophen 650 MG CR tablet Commonly known as: TYLENOL Take 1,300 mg by mouth in the morning and at bedtime.   amLODipine 10 MG tablet Commonly known as: NORVASC TAKE ONE TABLET BY MOUTH DAILY   aspirin EC 81 MG tablet Take 1 tablet (81 mg total) by mouth daily.   aspirin EC 81 MG tablet Take 1 tablet (81 mg total) by mouth 2 (two) times daily.   atorvastatin 80 MG tablet Commonly known as: LIPITOR TAKE ONE TABLET BY MOUTH DAILY   Calcium 600 1500 (600  Ca) MG Tabs tablet Generic drug: calcium carbonate Take 600 mg by mouth 2 (two) times daily.   cetirizine 10 MG tablet Commonly known as: ZYRTEC Take 1 tablet (10 mg total) by mouth daily.   cholecalciferol 25 MCG (1000 UNIT) tablet Commonly known as: VITAMIN D3 Take 1,000 Units by mouth daily.   Fish Oil 1200 MG Caps Take 1 capsule by mouth 2 (two) times daily.   hydrochlorothiazide 25 MG tablet Commonly known as: HYDRODIURIL TAKE ONE TABLET BY MOUTH DAILY   isosorbide mononitrate 30 MG 24 hr tablet Commonly known as: IMDUR TAKE ONE TABLET BY MOUTH DAILY What changed:   how much to take  how to take this  when to take this   losartan 25 MG tablet Commonly known as: COZAAR TAKE ONE TABLET BY MOUTH DAILY   methocarbamol 500 MG tablet Commonly known as: ROBAXIN Take 1 tablet (500 mg total) by mouth every 6 (six) hours as needed for muscle spasms.   multivitamins ther. w/minerals Tabs tablet Take 1 tablet by mouth daily.   ondansetron 4 MG tablet Commonly known as: ZOFRAN Take 1-2 tablets (4-8 mg total) by mouth every 8 (eight) hours as needed for nausea or vomiting.   oxyCODONE 5 MG immediate release tablet Commonly known as: Oxy IR/ROXICODONE Take 1-2 tablets (5-10 mg total) by mouth every 8 (eight) hours as needed for severe pain.   sulfamethoxazole-trimethoprim 800-160 MG tablet Commonly known as: BACTRIM DS Take 1 tablet by mouth 2 (two) times daily.     ASK your doctor about these medications   oxyCODONE 10 mg 12 hr tablet Commonly known as: OXYCONTIN Take 1 tablet (10 mg total) by mouth every 12 (twelve) hours for 3 days. Ask about: Should I take this medication?       Diagnostic Studies: DG Chest 2 View  Result Date: 02/19/2020 CLINICAL DATA:  Osteoarthritis RIGHT knee, preoperative evaluation for RIGHT knee surgery, history hypertension, diabetes mellitus, former smoker EXAM: CHEST - 2 VIEW COMPARISON:  None FINDINGS: Normal heart size,  mediastinal contours, and pulmonary vascularity. Minimal subsegmental atelectasis versus scarring in lingula. Remaining lungs clear. No pleural effusion or pneumothorax. Biconvex thoracolumbar scoliosis identified. IMPRESSION: Minimal atelectasis versus scarring in lingula. Electronically Signed   By: Ulyses Southward M.D.   On: 02/19/2020 17:43   DG Knee Right Port  Result Date: 02/23/2020 CLINICAL DATA:  Status post total knee arthroplasty EXAM: PORTABLE RIGHT KNEE - 1-2 VIEW COMPARISON:  September 16, 2019 FINDINGS: Frontal and lateral views were obtained. Patient is status post total knee replacement with femoral and tibial prosthetic components well-seated. No fracture or dislocation. No erosive change. Intra-articular air is an expected postoperative finding. IMPRESSION: Prosthetic components well-seated.  No fracture or dislocation. Electronically Signed   By: Lowella Grip III M.D.   On: 02/23/2020 12:50    Disposition: Discharge disposition: 01-Home or Self Care       Discharge Instructions    Call MD / Call 911   Complete by: As directed    If you experience chest pain or shortness of breath, CALL 911 and be transported to the hospital emergency room.  If you develope a fever above 101.5 F, pus (white drainage) or increased drainage or redness at the wound, or calf pain, call your surgeon's office.   Constipation Prevention   Complete by: As directed    Drink plenty of fluids.  Prune juice may be helpful.  You may use a stool softener, such as Colace (over the counter) 100 mg twice a day.  Use MiraLax (over the counter) for constipation as needed.   Driving restrictions   Complete by: As directed    No driving while taking narcotic pain meds.   Increase activity slowly as tolerated   Complete by: As directed       Follow-up Information    Leandrew Koyanagi, MD In 2 weeks.   Specialty: Orthopedic Surgery Why: For suture removal, For wound re-check Contact information: [place] [address] [phone]        Golda Acre, [place] Follow up.   Specialty: Home Health Services Why: [facility] will be providing physical therapy after discharge from the hospital.  You should receive a phone call within 24-48 hours of discharge from the hospital to set up services.  Physical Therapy services will start Friday, 02/27/2020. Contact information: [place] [address] [phone]            Signed: Eduard Roux 03/01/2020, 9:28 AM

## 2020-03-02 DIAGNOSIS — E669 Obesity, unspecified: Secondary | ICD-10-CM | POA: Diagnosis not present

## 2020-03-02 DIAGNOSIS — I110 Hypertensive heart disease with heart failure: Secondary | ICD-10-CM | POA: Diagnosis not present

## 2020-03-02 DIAGNOSIS — E119 Type 2 diabetes mellitus without complications: Secondary | ICD-10-CM | POA: Diagnosis not present

## 2020-03-02 DIAGNOSIS — K5730 Diverticulosis of large intestine without perforation or abscess without bleeding: Secondary | ICD-10-CM | POA: Diagnosis not present

## 2020-03-02 DIAGNOSIS — M1990 Unspecified osteoarthritis, unspecified site: Secondary | ICD-10-CM | POA: Diagnosis not present

## 2020-03-02 DIAGNOSIS — I272 Pulmonary hypertension, unspecified: Secondary | ICD-10-CM | POA: Diagnosis not present

## 2020-03-02 DIAGNOSIS — I509 Heart failure, unspecified: Secondary | ICD-10-CM | POA: Diagnosis not present

## 2020-03-02 DIAGNOSIS — Z471 Aftercare following joint replacement surgery: Secondary | ICD-10-CM | POA: Diagnosis not present

## 2020-03-02 DIAGNOSIS — E785 Hyperlipidemia, unspecified: Secondary | ICD-10-CM | POA: Diagnosis not present

## 2020-03-02 DIAGNOSIS — I2510 Atherosclerotic heart disease of native coronary artery without angina pectoris: Secondary | ICD-10-CM | POA: Diagnosis not present

## 2020-03-03 DIAGNOSIS — I110 Hypertensive heart disease with heart failure: Secondary | ICD-10-CM | POA: Diagnosis not present

## 2020-03-03 DIAGNOSIS — Z471 Aftercare following joint replacement surgery: Secondary | ICD-10-CM | POA: Diagnosis not present

## 2020-03-03 DIAGNOSIS — M1990 Unspecified osteoarthritis, unspecified site: Secondary | ICD-10-CM | POA: Diagnosis not present

## 2020-03-03 DIAGNOSIS — K5730 Diverticulosis of large intestine without perforation or abscess without bleeding: Secondary | ICD-10-CM | POA: Diagnosis not present

## 2020-03-03 DIAGNOSIS — E785 Hyperlipidemia, unspecified: Secondary | ICD-10-CM | POA: Diagnosis not present

## 2020-03-03 DIAGNOSIS — I2510 Atherosclerotic heart disease of native coronary artery without angina pectoris: Secondary | ICD-10-CM | POA: Diagnosis not present

## 2020-03-03 DIAGNOSIS — I509 Heart failure, unspecified: Secondary | ICD-10-CM | POA: Diagnosis not present

## 2020-03-03 DIAGNOSIS — E669 Obesity, unspecified: Secondary | ICD-10-CM | POA: Diagnosis not present

## 2020-03-03 DIAGNOSIS — E119 Type 2 diabetes mellitus without complications: Secondary | ICD-10-CM | POA: Diagnosis not present

## 2020-03-03 DIAGNOSIS — I272 Pulmonary hypertension, unspecified: Secondary | ICD-10-CM | POA: Diagnosis not present

## 2020-03-05 DIAGNOSIS — E119 Type 2 diabetes mellitus without complications: Secondary | ICD-10-CM | POA: Diagnosis not present

## 2020-03-05 DIAGNOSIS — K5730 Diverticulosis of large intestine without perforation or abscess without bleeding: Secondary | ICD-10-CM | POA: Diagnosis not present

## 2020-03-05 DIAGNOSIS — I272 Pulmonary hypertension, unspecified: Secondary | ICD-10-CM | POA: Diagnosis not present

## 2020-03-05 DIAGNOSIS — M1990 Unspecified osteoarthritis, unspecified site: Secondary | ICD-10-CM | POA: Diagnosis not present

## 2020-03-05 DIAGNOSIS — I509 Heart failure, unspecified: Secondary | ICD-10-CM | POA: Diagnosis not present

## 2020-03-05 DIAGNOSIS — I2510 Atherosclerotic heart disease of native coronary artery without angina pectoris: Secondary | ICD-10-CM | POA: Diagnosis not present

## 2020-03-05 DIAGNOSIS — Z471 Aftercare following joint replacement surgery: Secondary | ICD-10-CM | POA: Diagnosis not present

## 2020-03-05 DIAGNOSIS — E785 Hyperlipidemia, unspecified: Secondary | ICD-10-CM | POA: Diagnosis not present

## 2020-03-05 DIAGNOSIS — E669 Obesity, unspecified: Secondary | ICD-10-CM | POA: Diagnosis not present

## 2020-03-05 DIAGNOSIS — I110 Hypertensive heart disease with heart failure: Secondary | ICD-10-CM | POA: Diagnosis not present

## 2020-03-08 DIAGNOSIS — I509 Heart failure, unspecified: Secondary | ICD-10-CM | POA: Diagnosis not present

## 2020-03-08 DIAGNOSIS — K5730 Diverticulosis of large intestine without perforation or abscess without bleeding: Secondary | ICD-10-CM | POA: Diagnosis not present

## 2020-03-08 DIAGNOSIS — E785 Hyperlipidemia, unspecified: Secondary | ICD-10-CM | POA: Diagnosis not present

## 2020-03-08 DIAGNOSIS — Z471 Aftercare following joint replacement surgery: Secondary | ICD-10-CM | POA: Diagnosis not present

## 2020-03-08 DIAGNOSIS — E669 Obesity, unspecified: Secondary | ICD-10-CM | POA: Diagnosis not present

## 2020-03-08 DIAGNOSIS — I272 Pulmonary hypertension, unspecified: Secondary | ICD-10-CM | POA: Diagnosis not present

## 2020-03-08 DIAGNOSIS — I2510 Atherosclerotic heart disease of native coronary artery without angina pectoris: Secondary | ICD-10-CM | POA: Diagnosis not present

## 2020-03-08 DIAGNOSIS — I110 Hypertensive heart disease with heart failure: Secondary | ICD-10-CM | POA: Diagnosis not present

## 2020-03-08 DIAGNOSIS — M1990 Unspecified osteoarthritis, unspecified site: Secondary | ICD-10-CM | POA: Diagnosis not present

## 2020-03-08 DIAGNOSIS — E119 Type 2 diabetes mellitus without complications: Secondary | ICD-10-CM | POA: Diagnosis not present

## 2020-03-09 ENCOUNTER — Ambulatory Visit (INDEPENDENT_AMBULATORY_CARE_PROVIDER_SITE_OTHER): Payer: Medicare HMO | Admitting: Orthopaedic Surgery

## 2020-03-09 ENCOUNTER — Encounter: Payer: Self-pay | Admitting: Orthopaedic Surgery

## 2020-03-09 ENCOUNTER — Other Ambulatory Visit: Payer: Self-pay

## 2020-03-09 VITALS — Ht 62.0 in | Wt 196.0 lb

## 2020-03-09 DIAGNOSIS — Z96651 Presence of right artificial knee joint: Secondary | ICD-10-CM

## 2020-03-09 MED ORDER — METHOCARBAMOL 750 MG PO TABS
750.0000 mg | ORAL_TABLET | Freq: Two times a day (BID) | ORAL | 3 refills | Status: DC | PRN
Start: 2020-03-09 — End: 2020-06-18

## 2020-03-09 NOTE — Progress Notes (Signed)
Post-Op Visit Note   Patient: Terri Estrada           Date of Birth: 03-Dec-1950           MRN: [id number] Visit Date: 03/09/2020 PCP: Stark Klein, MD   Assessment & Plan:  Chief Complaint:  Chief Complaint  Patient presents with  . Right Knee - Routine Post Op    Right TKA DOS 02-23-2020   Visit Diagnoses:  1. Status post total right knee replacement     Plan: Holleigh is 2 weeks status post right total knee replacement.  She finished home physical therapy yesterday.  Overall doing well.  She is mainly just taken Tylenol for the pain.  We refilled her Robaxin today.  Incision is healed without any evidence of infection.  She has mild postoperative swelling.  Range of motion is progressing appropriately.  At this point she will attend outpatient physical therapy which we made a referral for.  She will call in the meantime with any questions or concerns.  Continue with TED hose during the day.  Recheck in 4 weeks with two-view x-rays of the right knee.  Continue aspirin for DVT prophylaxis.  Follow-Up Instructions: Return in about 4 weeks (around 04/06/2020).   Orders:  Orders Placed This Encounter  Procedures  . Ambulatory referral to Physical Therapy   Meds ordered this encounter  Medications  . methocarbamol (ROBAXIN) 750 MG tablet    Sig: Take 1 tablet (750 mg total) by mouth 2 (two) times daily as needed for muscle spasms.    Dispense:  20 tablet    Refill:  3    Imaging: No results found.  PMFS History: Patient Active Problem List   Diagnosis Date Noted  . Status post total right knee replacement 02/23/2020  . Primary osteoarthritis of right knee 02/22/2020  . Acute seasonal allergic rhinitis 12/19/2019  . Pre-op evaluation 10/23/2019  . Murmur 12/10/2015  . Pes planus of both feet 01/07/2015  . Depression 01/07/2015  . Female cystocele 06/26/2014  . Healthcare maintenance 12/17/2012  . Osteoarthritis 06/21/2010  . Hyperlipidemia 05/31/2009  .  DIVERTICULOSIS, COLON 10/16/2008  . AORTIC STENOSIS, MILD 06/02/2008  . Diabetes mellitus type 2, controlled, without complications ([place]) 38/75/6433  . Obesity (BMI 30-39.9) 12/06/2006  . SICKLE CELL TRAIT 12/06/2006  . HYPERTENSION, BENIGN SYSTEMIC 12/06/2006  . CORONARY, ARTERIOSCLEROSIS 12/06/2006   Past Medical History:  Diagnosis Date  . Arthritis   . CAD in native artery    a. MI 2001 s/p PTCA/stenting to mid-distal junction of RCA - residual dx treated medically.  . Diverticulosis of colon (without mention of hemorrhage)   . Essential hypertension, benign   . MI (myocardial infarction) ([place])   . Mild pulmonary hypertension ([place])   . Obesity, unspecified   . Onychia and paronychia of toe   . Osteoarthrosis, unspecified whether generalized or localized, unspecified site   . Other and unspecified hyperlipidemia   . Sickle-cell trait ([place])   . Sinus bradycardia    a. HR 40s even on low dose metoprolol - BB stopped with resolution.  . Streptococcal sore throat   . Type II or unspecified type diabetes mellitus without mention of complication, not stated as uncontrolled     Family History  Problem Relation Age of Onset  . Sickle cell anemia Daughter   . Hypertension Mother   . Diabetes type II Mother   . Diabetes type II Sister   . Breast cancer Sister  Past Surgical History:  Procedure Laterality Date  . ANGIOPLASTY  09/21/00  . CARDIAC CATHETERIZATION    . TOTAL KNEE ARTHROPLASTY Right 02/23/2020   Procedure: RIGHT TOTAL KNEE ARTHROPLASTY;  Surgeon: Tarry Kos, MD;  Location: [facility] OR;  Service: Orthopedics;  Laterality: Right;   Social History   Occupational History  . Occupation: DAY Associate Professor: SELF-EMPLOYED  Tobacco Use  . Smoking status: Former Games developer  . Smokeless tobacco: Never Used  . Tobacco comment: quit nov 2001  Substance and Sexual Activity  . Alcohol use: No  . Drug use: No  . Sexual activity: Yes    Birth control/protection:  Post-menopausal

## 2020-03-12 ENCOUNTER — Other Ambulatory Visit: Payer: Self-pay

## 2020-03-12 ENCOUNTER — Encounter: Payer: Self-pay | Admitting: Physical Therapy

## 2020-03-12 ENCOUNTER — Ambulatory Visit: Payer: Medicare HMO | Attending: Orthopaedic Surgery | Admitting: Physical Therapy

## 2020-03-12 DIAGNOSIS — M6281 Muscle weakness (generalized): Secondary | ICD-10-CM | POA: Diagnosis present

## 2020-03-12 DIAGNOSIS — R269 Unspecified abnormalities of gait and mobility: Secondary | ICD-10-CM | POA: Diagnosis present

## 2020-03-12 DIAGNOSIS — M25561 Pain in right knee: Secondary | ICD-10-CM | POA: Insufficient documentation

## 2020-03-12 DIAGNOSIS — M25661 Stiffness of right knee, not elsewhere classified: Secondary | ICD-10-CM | POA: Diagnosis present

## 2020-03-12 DIAGNOSIS — G8929 Other chronic pain: Secondary | ICD-10-CM | POA: Diagnosis present

## 2020-03-12 NOTE — Therapy (Signed)
[facility] [address] [place], [place], [address] Phone: [phone]   Fax:  [phone]  Physical Therapy Evaluation  Patient Details  Name: Terri Estrada MRN: [id number] Date of Birth: 12/30/1950 Referring Provider (PT):  Tarry Kos, MD    Encounter Date: 03/12/2020  PT End of Session - 03/12/20 0910    Visit Number  1    Number of Visits  17    Date for PT Re-Evaluation  05/07/20    Authorization Type  MCR: Kx mod at 15th visit    Progress Note Due on Visit  10    PT Start Time  0912    PT Stop Time  0955    PT Time Calculation (min)  43 min    Activity Tolerance  Patient tolerated treatment well    Behavior During Therapy  [facility] for tasks assessed/performed       Past Medical History:  Diagnosis Date  . Arthritis   . CAD in native artery    a. MI 2001 s/p PTCA/stenting to mid-distal junction of RCA - residual dx treated medically.  . Depression    pt reports having it intermittently  . Diverticulosis of colon (without mention of hemorrhage)   . Essential hypertension, benign   . MI (myocardial infarction) (HCC)   . Mild pulmonary hypertension (HCC)   . Obesity, unspecified   . Onychia and paronychia of toe   . Osteoarthrosis, unspecified whether generalized or localized, unspecified site   . Other and unspecified hyperlipidemia   . Sickle-cell trait (HCC)   . Sinus bradycardia    a. HR 40s even on low dose metoprolol - BB stopped with resolution.  . Streptococcal sore throat   . Type II or unspecified type diabetes mellitus without mention of complication, not stated as uncontrolled     Past Surgical History:  Procedure Laterality Date  . ANGIOPLASTY  09/21/00  . CARDIAC CATHETERIZATION    . TOTAL KNEE ARTHROPLASTY Right 02/23/2020   Procedure: RIGHT TOTAL KNEE ARTHROPLASTY;  Surgeon: Tarry Kos, MD;  Location: [facility] OR;  Service: Orthopedics;  Laterality: Right;    There were no vitals filed for  this visit.   Subjective Assessment - 03/12/20 0918    Subjective  pt is a 69 y.o F s/p  R TKA on 02/23/2020. She reports she feels she is doing pretty good, her biggest issue is the swelling. she rpeorts she has been keeping up with walking which she feels has helped. pt reports having 5 HHPT visits before coming to OPPT. She reports continued N/Tin the lateral aspect of the knee.    How long can you sit comfortably?  20 min    How long can you stand comfortably?  20 min with RW    How long can you walk comfortably?  20 min RW    Diagnostic tests  02/23/2020 x-ray IMPRESSION:Prosthetic components well-seated.  No fracture or dislocation.    Patient Stated Goals  to get rid of the swelling, reduce pain/ stiffness    Currently in Pain?  Yes    Pain Score  5    at worst 9/10   Pain Location  Knee    Pain Orientation  Right    Pain Descriptors / Indicators  Aching;Sore;Tightness    Pain Type  Chronic pain    Pain Onset  More than a month ago    Pain Frequency  Intermittent    Aggravating Factors   swelling in  the knee, bending the knee, standing/ walking    Pain Relieving Factors  sit and rest. tylenol    Effect of Pain on Daily Activities  limited walking/ standing, general mobility         [facility] PT Assessment - 03/12/20 0001      Assessment   Medical Diagnosis  Status post total right knee replacement Z96.651    Referring Provider (PT)   Tarry Kos, MD     Onset Date/Surgical Date  02/23/20    Hand Dominance  Right    Next MD Visit  04/06/2020    Prior Therapy  no      Precautions   Precautions  None      Restrictions   Weight Bearing Restrictions  No      Balance Screen   Has the patient fallen in the past 6 months  No    Has the patient had a decrease in activity level because of a fear of falling?   No    Is the patient reluctant to leave their home because of a fear of falling?   No      Home Nurse, mental health  Private residence    Living  Arrangements  Alone    Available Help at Discharge  Friend(s)    Type of Home  House    Home Access  Stairs to enter    Entrance Stairs-Number of Steps  8    Entrance Stairs-Rails  Left;Right    Home Layout  One level    Home Equipment  Walker - 2 wheels;Gilmer Mor - single point      Prior Function   Level of Independence  Independent with basic ADLs    Vocation  Retired      IT consultant   Overall Cognitive Status  Within Functional Limits for tasks assessed      Observation/Other Assessments   Observations  increased swelling in the R knee compared bil, pt is wearing a compression sleeve/ stocking    Focus on Therapeutic Outcomes (FOTO)   36% limited   predicted 28% limited     Posture/Postural Control   Posture/Postural Control  Postural limitations    Postural Limitations  Rounded Shoulders;Forward head      ROM / Strength   AROM / PROM / Strength  AROM;Strength;PROM      AROM   Overall AROM Comments  pt states using CPM at 92 degrees flexion at home    AROM Assessment Site  Knee    Right/Left Knee  Right;Left    Right Knee Extension  12    Right Knee Flexion  66    Left Knee Extension  4    Left Knee Flexion  110      PROM   PROM Assessment Site  Knee    Right/Left Knee  Right    Right Knee Extension  8    Right Knee Flexion  76      Strength   Strength Assessment Site  Hip;Knee    Right/Left Hip  Right;Left    Right Hip Flexion  3/5    Right Hip ABduction  3+/5    Left Hip Flexion  3+/5    Left Hip ABduction  3+/5    Right/Left Knee  Right;Left    Right Knee Flexion  3+/5   secondary to pain and guarding    Right Knee Extension  3+/5    Left Knee Flexion  4+/5    Left  Knee Extension  4+/5      Palpation   Palpation comment  TTP peri-patellar and with increased palpable swelling noted compared bil.      Ambulation/Gait   Ambulation/Gait  Yes    Assistive device  Rolling walker    Gait Pattern  Decreased stance time - right;Decreased step length -  left;Step-through pattern;Trendelenburg;Antalgic                  Objective measurements completed on examination: See above findings.      [facility] Adult PT Treatment/Exercise - 03/12/20 0001      Exercises   Exercises  Knee/Hip      Knee/Hip Exercises: Stretches   Active Hamstring Stretch  2 reps;30 seconds;Right    Quad Stretch  Right;2 reps;30 seconds      Knee/Hip Exercises: Supine   Heel Slides  Right;1 set;5 reps      Modalities   Modalities  Vasopneumatic      Vasopneumatic   Number Minutes Vasopneumatic   10 minutes    Vasopnuematic Location   Knee    Vasopneumatic Pressure  Medium    Vasopneumatic Temperature   34             PT Education - 03/12/20 0911    Education Details  evaluation findings, POC, goals. HEP with proper form/ rationale    Person(s) Educated  Patient    Methods  Explanation;Verbal cues;Handout    Comprehension  Verbalized understanding;Verbal cues required       PT Short Term Goals - 03/12/20 1003      PT SHORT TERM GOAL #1   Title  pt to be I with intial HEP    Time  4    Period  Weeks    Status  New    Target Date  04/09/20      PT SHORT TERM GOAL #2   Title  increase knee ROM to >/= 8 - 100 degrees for functional knee ROM progression with </= 5/10 pain    Time  4    Period  Weeks    Status  New    Target Date  04/09/20      PT SHORT TERM GOAL #3   Title  pt to be able to transitoin from using the RW to [facility] safely to maximzie mobility.    Time  4    Period  Weeks    Status  New    Target Date  04/09/20        PT Long Term Goals - 03/12/20 1004      PT LONG TERM GOAL #1   Title  pt to increaes knee ROM to >/= 3 to 115 degrees for functional ROM required for functional and efficient gait pattern    Time  8    Period  Weeks    Status  New    Target Date  05/07/20      PT LONG TERM GOAL #2   Title  pt to increase RLE gross strength to >/= 4+/5 to promote hip/ knee stability    Time  8    Period   Weeks    Status  New    Target Date  05/07/20      PT LONG TERM GOAL #3   Title  pt tobe able to sit, stand and walk for >/= 45 min with [facility] for functional endurance required for in home and community ambulation    Time  8  Period  Weeks    Target Date  05/07/20      PT LONG TERM GOAL #4   Title  increase FOTO score to </= 28% limited to demo improvement in function    Time  8    Period  Weeks    Status  New    Target Date  05/07/20      PT LONG TERM GOAL #5   Title  pt to be I with all HEP given to maintain and progress current level of function    Time  8    Period  Weeks    Status  New    Target Date  05/07/20             Plan - 03/12/20 0911    Clinical Impression Statement  pt presents to OPPT s/p  RTKA on 02/23/2020. She demonstrates limited knee ROM at 12 - 66 actively and general weakness in the RLE as expected following surgery. She currently use a RW demonstrating antalgic gait pattern with limited stance on RLE and step with LLE. She would benefit from physica ltherapy to decrease  Rknee pain/ stiffness, improve ROM, increase strength, maximize gait efficency and overall function by addressing the deficits listed.    Personal Factors and Comorbidities  Comorbidity 3+    Comorbidities  hx of MI, DM, depression    Stability/Clinical Decision Making  Evolving/Moderate complexity    Clinical Decision Making  Moderate    Rehab Potential  Good    PT Frequency  2x / week    PT Duration  8 weeks    PT Treatment/Interventions  ADLs/Self Care Home Management;Electrical Stimulation;Iontophoresis 4mg /ml Dexamethasone;Cryotherapy;Moist Heat;Ultrasound;Therapeutic activities;Therapeutic exercise;Neuromuscular re-education;Balance training;Patient/family education;Manual techniques;Dry needling;Taping;Vasopneumatic Device;Passive range of motion;Scar mobilization    PT Next Visit Plan  review/ update HEP PRN, provide pt FOTO handout, knee mobs and ROM, bike vs nu-step, gait  training, Vaso for pain/ swelling    PT Home Exercise Plan  [id number] - heel slide with strap, seated heel slide, SLR, quad set, sidelying hip abduction    Consulted and Agree with Plan of Care  Patient       Patient will benefit from skilled therapeutic intervention in order to improve the following deficits and impairments:  Abnormal gait, Improper body mechanics, Increased muscle spasms, Decreased strength, Postural dysfunction, Decreased activity tolerance, Decreased endurance, Pain, Decreased range of motion  Visit Diagnosis: Chronic pain of right knee  Stiffness of right knee, not elsewhere classified  Abnormal gait  Muscle weakness (generalized)     Problem List Patient Active Problem List   Diagnosis Date Noted  . Status post total right knee replacement 02/23/2020  . Primary osteoarthritis of right knee 02/22/2020  . Acute seasonal allergic rhinitis 12/19/2019  . Pre-op evaluation 10/23/2019  . Murmur 12/10/2015  . Pes planus of both feet 01/07/2015  . Depression 01/07/2015  . Female cystocele 06/26/2014  . Healthcare maintenance 12/17/2012  . Osteoarthritis 06/21/2010  . Hyperlipidemia 05/31/2009  . DIVERTICULOSIS, COLON 10/16/2008  . AORTIC STENOSIS, MILD 06/02/2008  . Diabetes mellitus type 2, controlled, without complications (HCC) 12/06/2006  . Obesity (BMI 30-39.9) 12/06/2006  . SICKLE CELL TRAIT 12/06/2006  . HYPERTENSION, BENIGN SYSTEMIC 12/06/2006  . CORONARY, ARTERIOSCLEROSIS 12/06/2006    12/08/2006 PT, DPT, LAT, ATC  03/12/20  10:09 AM      [facility] [address] [place], [place], [place] Phone: [phone]   Fax:  [phone]  Name: Terri Estrada  MRN: [id number] Date of Birth: 1951/08/30

## 2020-03-22 ENCOUNTER — Other Ambulatory Visit: Payer: Self-pay

## 2020-03-22 ENCOUNTER — Ambulatory Visit: Payer: Medicare HMO | Admitting: Physical Therapy

## 2020-03-22 ENCOUNTER — Encounter: Payer: Self-pay | Admitting: Physical Therapy

## 2020-03-22 DIAGNOSIS — R269 Unspecified abnormalities of gait and mobility: Secondary | ICD-10-CM

## 2020-03-22 DIAGNOSIS — M25661 Stiffness of right knee, not elsewhere classified: Secondary | ICD-10-CM

## 2020-03-22 DIAGNOSIS — M25561 Pain in right knee: Secondary | ICD-10-CM

## 2020-03-22 DIAGNOSIS — M6281 Muscle weakness (generalized): Secondary | ICD-10-CM | POA: Diagnosis not present

## 2020-03-22 DIAGNOSIS — G8929 Other chronic pain: Secondary | ICD-10-CM | POA: Diagnosis not present

## 2020-03-22 NOTE — Therapy (Signed)
[facility] Outpatient Rehabilitation [facility] [address] [place], [place], [address] Phone: [phone]   Fax:  [phone]  Physical Therapy Treatment  Patient Details  Name: Terri Estrada MRN: [id number] Date of Birth: 03-12-1951 Referring Provider (PT):  Terri Kos, MD    Encounter Date: 03/22/2020   PT End of Session - 03/22/20 0819    Visit Number 2    Number of Visits 17    Authorization Type MCR: Kx mod at 15th visit    PT Start Time 0757    PT Stop Time 0850    PT Time Calculation (min) 53 min           Past Medical History:  Diagnosis Date  . Arthritis   . CAD in native artery    a. MI 2001 s/p PTCA/stenting to mid-distal junction of RCA - residual dx treated medically.  . Depression    pt reports having it intermittently  . Diverticulosis of colon (without mention of hemorrhage)   . Essential hypertension, benign   . MI (myocardial infarction) (HCC)   . Mild pulmonary hypertension (HCC)   . Obesity, unspecified   . Onychia and paronychia of toe   . Osteoarthrosis, unspecified whether generalized or localized, unspecified site   . Other and unspecified hyperlipidemia   . Sickle-cell trait (HCC)   . Sinus bradycardia    a. HR 40s even on low dose metoprolol - BB stopped with resolution.  . Streptococcal sore throat   . Type II or unspecified type diabetes mellitus without mention of complication, not stated as uncontrolled     Past Surgical History:  Procedure Laterality Date  . ANGIOPLASTY  09/21/00  . CARDIAC CATHETERIZATION    . TOTAL KNEE ARTHROPLASTY Right 02/23/2020   Procedure: RIGHT TOTAL KNEE ARTHROPLASTY;  Surgeon: Terri Kos, MD;  Location: [facility] OR;  Service: Orthopedics;  Laterality: Right;    There were no vitals filed for this visit.   Subjective Assessment - 03/22/20 0812    Subjective I am having a lot of trouble with swelling. Knee feels tight.    Currently in Pain? No/denies              [facility] PT  Assessment - 03/22/20 0001      AROM   Right Knee Flexion 76      PROM   Right Knee Flexion 78                         [facility] Adult PT Treatment/Exercise - 03/22/20 0001      Knee/Hip Exercises: Stretches   Passive Hamstring Stretch 1 rep;60 seconds    Knee: Self-Stretch Limitations seated with over pressure      Knee/Hip Exercises: Aerobic   Nustep L3 UE/LE x 5 minutes       Knee/Hip Exercises: Seated   Long Arc Quad 20 reps      Knee/Hip Exercises: Supine   Quad Sets 20 reps    Quad Sets Limitations supine and long sitting for feedback     Heel Slides Right;1 set;5 reps    Heel Slides Limitations sheet assist     Straight Leg Raises 10 reps    Straight Leg Raises Limitations 2 sets      Vasopneumatic   Number Minutes Vasopneumatic  15 minutes    Vasopnuematic Location  Knee    Vasopneumatic Pressure Low    Vasopneumatic Temperature  34  PT Short Term Goals - 03/12/20 1003      PT SHORT TERM GOAL #1   Title pt to be I with intial HEP    Time 4    Period Weeks    Status New    Target Date 04/09/20      PT SHORT TERM GOAL #2   Title increase knee ROM to >/= 8 - 100 degrees for functional knee ROM progression with </= 5/10 pain    Time 4    Period Weeks    Status New    Target Date 04/09/20      PT SHORT TERM GOAL #3   Title pt to be able to transitoin from using the RW to [facility] safely to maximzie mobility.    Time 4    Period Weeks    Status New    Target Date 04/09/20             PT Long Term Goals - 03/12/20 1004      PT LONG TERM GOAL #1   Title pt to increaes knee ROM to >/= 3 to 115 degrees for functional ROM required for functional and efficient gait pattern    Time 8    Period Weeks    Status New    Target Date 05/07/20      PT LONG TERM GOAL #2   Title pt to increase RLE gross strength to >/= 4+/5 to promote hip/ knee stability    Time 8    Period Weeks    Status New    Target Date 05/07/20       PT LONG TERM GOAL #3   Title pt tobe able to sit, stand and walk for >/= 45 min with [facility] for functional endurance required for in home and community ambulation    Time 8    Period Weeks    Target Date 05/07/20      PT LONG TERM GOAL #4   Title increase FOTO score to </= 28% limited to demo improvement in function    Time 8    Period Weeks    Status New    Target Date 05/07/20      PT LONG TERM GOAL #5   Title pt to be I with all HEP given to maintain and progress current level of function    Time 8    Period Weeks    Status New    Target Date 05/07/20                 Plan - 03/22/20 0904    Clinical Impression Statement Terri Estrada reports difficulty with swelling, She is wearing TED hose. Time spent with education about frequent elevation and icing multiple times per day. She is surrently only elevating when using ice. Reviewed HEP. Her AROM has improved for flexion, still lacks full active extension. Trial of VASO for edema.    PT Next Visit Plan review/ update HEP PRN, provide pt FOTO handout, knee mobs and ROM, bike vs nu-step, gait training, Vaso for pain/ swelling    PT Home Exercise Plan [id number] - heel slide with strap, seated heel slide, SLR, quad set, sidelying hip abduction           Patient will benefit from skilled therapeutic intervention in order to improve the following deficits and impairments:  Abnormal gait, Improper body mechanics, Increased muscle spasms, Decreased strength, Postural dysfunction, Decreased activity tolerance, Decreased endurance, Pain, Decreased range of motion  Visit Diagnosis:  Chronic pain of right knee  Stiffness of right knee, not elsewhere classified  Abnormal gait  Muscle weakness (generalized)     Problem List Patient Active Problem List   Diagnosis Date Noted  . Status post total right knee replacement 02/23/2020  . Primary osteoarthritis of right knee 02/22/2020  . Acute seasonal allergic rhinitis 12/19/2019   . Pre-op evaluation 10/23/2019  . Murmur 12/10/2015  . Pes planus of both feet 01/07/2015  . Depression 01/07/2015  . Female cystocele 06/26/2014  . Healthcare maintenance 12/17/2012  . Osteoarthritis 06/21/2010  . Hyperlipidemia 05/31/2009  . DIVERTICULOSIS, COLON 10/16/2008  . AORTIC STENOSIS, MILD 06/02/2008  . Diabetes mellitus type 2, controlled, without complications (HCC) 12/06/2006  . Obesity (BMI 30-39.9) 12/06/2006  . SICKLE CELL TRAIT 12/06/2006  . HYPERTENSION, BENIGN SYSTEMIC 12/06/2006  . CORONARY, ARTERIOSCLEROSIS 12/06/2006    Sherrie Mustache , PTA 03/22/2020, 9:31 AM  [facility] [address] [place], [place], [address] Phone: [phone]   Fax:  [phone]  Name: Terri Estrada MRN: [id number] Date of Birth: [birth year]

## 2020-03-25 ENCOUNTER — Ambulatory Visit: Payer: Medicare HMO | Admitting: Physical Therapy

## 2020-03-30 ENCOUNTER — Ambulatory Visit: Payer: Medicare HMO | Admitting: Physical Therapy

## 2020-04-01 ENCOUNTER — Ambulatory Visit: Payer: Medicare HMO | Admitting: Physical Therapy

## 2020-04-01 ENCOUNTER — Other Ambulatory Visit: Payer: Self-pay

## 2020-04-01 ENCOUNTER — Encounter: Payer: Self-pay | Admitting: Physical Therapy

## 2020-04-01 DIAGNOSIS — M6281 Muscle weakness (generalized): Secondary | ICD-10-CM | POA: Diagnosis not present

## 2020-04-01 DIAGNOSIS — M25661 Stiffness of right knee, not elsewhere classified: Secondary | ICD-10-CM

## 2020-04-01 DIAGNOSIS — R269 Unspecified abnormalities of gait and mobility: Secondary | ICD-10-CM

## 2020-04-01 DIAGNOSIS — G8929 Other chronic pain: Secondary | ICD-10-CM

## 2020-04-01 DIAGNOSIS — M25561 Pain in right knee: Secondary | ICD-10-CM | POA: Diagnosis not present

## 2020-04-01 NOTE — Therapy (Signed)
[place], [place], [address] Phone: [phone]   Fax:  [phone]  Physical Therapy Treatment  Patient Details  Name: Terri Estrada MRN: [id number] Date of Birth: [birth year] Referring Provider (PT):  Leandrew Koyanagi, MD    Encounter Date: 04/01/2020   PT End of Session - 04/01/20 0942    Visit Number 3    Number of Visits 17    Date for PT Re-Evaluation 05/07/20    Authorization Type MCR: Kx mod at 15th visit    PT Start Time 0930           Past Medical History:  Diagnosis Date  . Arthritis   . CAD in native artery    a. MI 2001 s/p PTCA/stenting to mid-distal junction of RCA - residual dx treated medically.  . Depression    pt reports having it intermittently  . Diverticulosis of colon (without mention of hemorrhage)   . Essential hypertension, benign   . MI (myocardial infarction) ([place])   . Mild pulmonary hypertension ([place])   . Obesity, unspecified   . Onychia and paronychia of toe   . Osteoarthrosis, unspecified whether generalized or localized, unspecified site   . Other and unspecified hyperlipidemia   . Sickle-cell trait ([place])   . Sinus bradycardia    a. HR 40s even on low dose metoprolol - BB stopped with resolution.  . Streptococcal sore throat   . Type II or unspecified type diabetes mellitus without mention of complication, not stated as uncontrolled     Past Surgical History:  Procedure Laterality Date  . ANGIOPLASTY  09/21/00  . CARDIAC CATHETERIZATION    . TOTAL KNEE ARTHROPLASTY Right 02/23/2020   Procedure: RIGHT TOTAL KNEE ARTHROPLASTY;  Surgeon: Leandrew Koyanagi, MD;  Location: [facility];  Service: Orthopedics;  Laterality: Right;    There were no vitals filed for this visit.   Subjective Assessment - 04/01/20 0940    Subjective Get tight with swelling when I am on my feet. Not really painful, just stiffness that bothers me. Using RW at home.    Currently in Pain?  No/denies              [facility] PT Assessment - 04/01/20 0001      AROM   Right Knee Flexion 88   seated, 80 supine     PROM   Right Knee Flexion 90   seated                         [facility] Adult PT Treatment/Exercise - 04/01/20 0001      Knee/Hip Exercises: Stretches   Active Hamstring Stretch Limitations supine with strap     Knee: Self-Stretch Limitations seated with over pressure, scoot stretch     Gastroc Stretch Limitations runners stretch at [employer]       Knee/Hip Exercises: Aerobic   Nustep L3 UE/LE x 6 minutes       Knee/Hip Exercises: Standing   Heel Raises 20 reps    Functional Squat Limitations mini squats at [employer]       Knee/Hip Exercises: Seated   Long Arc Quad 20 reps      Knee/Hip Exercises: Supine   Quad Sets 20 reps    Short Arc Quad Sets 20 reps    Heel Slides 10 reps    Heel Slides Limitations sheet assist     Straight Leg Raises 10 reps    Straight  Leg Raises Limitations 2 sets      Vasopneumatic   Number Minutes Vasopneumatic  15 minutes    Vasopnuematic Location  Knee    Vasopneumatic Pressure Low    Vasopneumatic Temperature  34                    PT Short Term Goals - 03/12/20 1003      PT SHORT TERM GOAL #1   Title pt to be I with intial HEP    Time 4    Period Weeks    Status New    Target Date 04/09/20      PT SHORT TERM GOAL #2   Title increase knee ROM to >/= 8 - 100 degrees for functional knee ROM progression with </= 5/10 pain    Time 4    Period Weeks    Status New    Target Date 04/09/20      PT SHORT TERM GOAL #3   Title pt to be able to transitoin from using the RW to [facility] safely to maximzie mobility.    Time 4    Period Weeks    Status New    Target Date 04/09/20             PT Long Term Goals - 03/12/20 1004      PT LONG TERM GOAL #1   Title pt to increaes knee ROM to >/= 3 to 115 degrees for functional ROM required for functional and efficient gait pattern    Time 8    Period Weeks     Status New    Target Date 05/07/20      PT LONG TERM GOAL #2   Title pt to increase RLE gross strength to >/= 4+/5 to promote hip/ knee stability    Time 8    Period Weeks    Status New    Target Date 05/07/20      PT LONG TERM GOAL #3   Title pt tobe able to sit, stand and walk for >/= 45 min with SPC for functional endurance required for in home and community ambulation    Time 8    Period Weeks    Target Date 05/07/20      PT LONG TERM GOAL #4   Title increase FOTO score to </= 28% limited to demo improvement in function    Time 8    Period Weeks    Status New    Target Date 05/07/20      PT LONG TERM GOAL #5   Title pt to be I with all HEP given to maintain and progress current level of function    Time 8    Period Weeks    Status New    Target Date 05/07/20                 Plan - 04/01/20 0942    Clinical Impression Statement Pt reports she feels increased swelling and tightness with activity on her feet. She is elevating and icing more frequently and continues to wear TED hose. She thought the vasopneumatic device was helpful with her pain and swelling. Her AROM and PROM have improved. She is consistent with her HEP. She continues with RW full time and notes decreased feeling of knee weakness. Quad lag improved but still present.    PT Treatment/Interventions ADLs/Self Care Home Management;Electrical Stimulation;Iontophoresis 4mg /ml Dexamethasone;Cryotherapy;Moist Heat;Ultrasound;Therapeutic activities;Therapeutic exercise;Neuromuscular re-education;Balance training;Patient/family education;Manual techniques;Dry needling;Taping;Vasopneumatic Device;Passive range of motion;Scar mobilization  PT Next Visit Plan review/ update HEP PRN, capture FOTO staus at visit 6 ,  knee mobs and ROM, bike vs nu-step, gait training, Vaso for pain/ swelling, begin gait with SPC    PT Home Exercise Plan [id number] - heel slide with strap, seated heel slide, SLR, quad set, sidelying hip  abduction           Patient will benefit from skilled therapeutic intervention in order to improve the following deficits and impairments:  Abnormal gait, Improper body mechanics, Increased muscle spasms, Decreased strength, Postural dysfunction, Decreased activity tolerance, Decreased endurance, Pain, Decreased range of motion  Visit Diagnosis: Chronic pain of right knee  Stiffness of right knee, not elsewhere classified  Abnormal gait  Muscle weakness (generalized)     Problem List Patient Active Problem List   Diagnosis Date Noted  . Status post total right knee replacement 02/23/2020  . Primary osteoarthritis of right knee 02/22/2020  . Acute seasonal allergic rhinitis 12/19/2019  . Pre-op evaluation 10/23/2019  . Murmur 12/10/2015  . Pes planus of both feet 01/07/2015  . Depression 01/07/2015  . Female cystocele 06/26/2014  . Healthcare maintenance 12/17/2012  . Osteoarthritis 06/21/2010  . Hyperlipidemia 05/31/2009  . DIVERTICULOSIS, COLON 10/16/2008  . AORTIC STENOSIS, MILD 06/02/2008  . Diabetes mellitus type 2, controlled, without complications (HCC) 12/06/2006  . Obesity (BMI 30-39.9) 12/06/2006  . SICKLE CELL TRAIT 12/06/2006  . HYPERTENSION, BENIGN SYSTEMIC 12/06/2006  . CORONARY, ARTERIOSCLEROSIS 12/06/2006    Sherrie Mustache, PTA 04/01/2020, 10:17 AM  [facility] [address] [place], [place], [address] Phone: [phone]   Fax:  [phone]  Name: Terri Estrada MRN: [id number] Date of Birth: [birth year]

## 2020-04-03 ENCOUNTER — Other Ambulatory Visit: Payer: Self-pay | Admitting: Orthopaedic Surgery

## 2020-04-05 ENCOUNTER — Other Ambulatory Visit: Payer: Self-pay

## 2020-04-05 ENCOUNTER — Encounter: Payer: Self-pay | Admitting: Physical Therapy

## 2020-04-05 ENCOUNTER — Ambulatory Visit: Payer: Medicare HMO | Admitting: Physical Therapy

## 2020-04-05 DIAGNOSIS — M25661 Stiffness of right knee, not elsewhere classified: Secondary | ICD-10-CM

## 2020-04-05 DIAGNOSIS — M25561 Pain in right knee: Secondary | ICD-10-CM | POA: Diagnosis not present

## 2020-04-05 DIAGNOSIS — G8929 Other chronic pain: Secondary | ICD-10-CM | POA: Diagnosis not present

## 2020-04-05 DIAGNOSIS — M6281 Muscle weakness (generalized): Secondary | ICD-10-CM | POA: Diagnosis not present

## 2020-04-05 DIAGNOSIS — R269 Unspecified abnormalities of gait and mobility: Secondary | ICD-10-CM | POA: Diagnosis not present

## 2020-04-05 NOTE — Therapy (Signed)
[facility] Outpatient Rehabilitation [facility] [address] [place], [place], [address] Phone: [phone]   Fax:  [phone]  Physical Therapy Treatment  Patient Details  Name: Terri Estrada MRN: [id number] Date of Birth: May 03, 1951 Referring Provider (PT):  Tarry Kos, MD    Encounter Date: 04/05/2020   PT End of Session - 04/05/20 0843    Visit Number 4    Number of Visits 17    Authorization Type MCR: Kx mod at 15th visit    PT Start Time 0800    PT Stop Time 0900    PT Time Calculation (min) 60 min           Past Medical History:  Diagnosis Date  . Arthritis   . CAD in native artery    a. MI 2001 s/p PTCA/stenting to mid-distal junction of RCA - residual dx treated medically.  . Depression    pt reports having it intermittently  . Diverticulosis of colon (without mention of hemorrhage)   . Essential hypertension, benign   . MI (myocardial infarction) (HCC)   . Mild pulmonary hypertension (HCC)   . Obesity, unspecified   . Onychia and paronychia of toe   . Osteoarthrosis, unspecified whether generalized or localized, unspecified site   . Other and unspecified hyperlipidemia   . Sickle-cell trait (HCC)   . Sinus bradycardia    a. HR 40s even on low dose metoprolol - BB stopped with resolution.  . Streptococcal sore throat   . Type II or unspecified type diabetes mellitus without mention of complication, not stated as uncontrolled     Past Surgical History:  Procedure Laterality Date  . ANGIOPLASTY  09/21/00  . CARDIAC CATHETERIZATION    . TOTAL KNEE ARTHROPLASTY Right 02/23/2020   Procedure: RIGHT TOTAL KNEE ARTHROPLASTY;  Surgeon: Tarry Kos, MD;  Location: [facility];  Service: Orthopedics;  Laterality: Right;    There were no vitals filed for this visit.   Subjective Assessment - 04/05/20 0807    Subjective Sore and heavy feeling yesterday on the inside and outside of the knee. It was also a little red.    Currently in Pain? Yes     Pain Score 5     Pain Location Knee    Pain Orientation Right    Pain Descriptors / Indicators Aching;Sore;Tightness    Pain Type Chronic pain    Aggravating Factors  increasing the exercise frequency    Pain Relieving Factors sit, rest, tylenol              [facility] PT Assessment - 04/05/20 0001      PROM   Right Knee Flexion 92   supine with strap                        [facility] Adult PT Treatment/Exercise - 04/05/20 0001      Knee/Hip Exercises: Stretches   Active Hamstring Stretch Limitations supine with strap     Hip Flexor Stretch Limitations gentle off side of mat x 3     Knee: Self-Stretch Limitations seated with over pressure, scoot stretch     Gastroc Stretch Limitations runners stretch at [employer]       Knee/Hip Exercises: Aerobic   Nustep L3 UE/LE x 6 minutes       Knee/Hip Exercises: Standing   Heel Raises 20 reps    Functional Squat Limitations mini squats at RW     Other Standing Knee Exercises 3  way hip x 10 each bilateral       Knee/Hip Exercises: Seated   Long Arc Quad 20 reps    Sit to [employer] 10 reps   with UE support     Vasopneumatic   Number Minutes Vasopneumatic  15 minutes    Vasopnuematic Location  Knee    Vasopneumatic Pressure Medium    Vasopneumatic Temperature  34      Manual Therapy   Manual therapy comments patella mobs all planes, P/A mobs to tibia , PROM knee flexion x 3                     PT Short Term Goals - 03/12/20 1003      PT SHORT TERM GOAL #1   Title pt to be I with intial HEP    Time 4    Period Weeks    Status New    Target Date 04/09/20      PT SHORT TERM GOAL #2   Title increase knee ROM to >/= 8 - 100 degrees for functional knee ROM progression with </= 5/10 pain    Time 4    Period Weeks    Status New    Target Date 04/09/20      PT SHORT TERM GOAL #3   Title pt to be able to transitoin from using the RW to [facility] safely to maximzie mobility.    Time 4    Period Weeks    Status New     Target Date 04/09/20             PT Long Term Goals - 03/12/20 1004      PT LONG TERM GOAL #1   Title pt to increaes knee ROM to >/= 3 to 115 degrees for functional ROM required for functional and efficient gait pattern    Time 8    Period Weeks    Status New    Target Date 05/07/20      PT LONG TERM GOAL #2   Title pt to increase RLE gross strength to >/= 4+/5 to promote hip/ knee stability    Time 8    Period Weeks    Status New    Target Date 05/07/20      PT LONG TERM GOAL #3   Title pt tobe able to sit, stand and walk for >/= 45 min with [facility] for functional endurance required for in home and community ambulation    Time 8    Period Weeks    Target Date 05/07/20      PT LONG TERM GOAL #4   Title increase FOTO score to </= 28% limited to demo improvement in function    Time 8    Period Weeks    Status New    Target Date 05/07/20      PT LONG TERM GOAL #5   Title pt to be I with all HEP given to maintain and progress current level of function    Time 8    Period Weeks    Status New    Target Date 05/07/20                 Plan - 04/05/20 0917    Clinical Impression Statement Pt reports some increased pain medial and lateral knee. Slight warmth noted at medial and lateral knee. Manual STW to distal quad performed as well as joint mobilizations and PROM to increase knee flexion. Improved knee flexion ROM  today. Continued with vaso to reduce edema and pain in right knee.    PT Treatment/Interventions ADLs/Self Care Home Management;Electrical Stimulation;Iontophoresis 4mg /ml Dexamethasone;Cryotherapy;Moist Heat;Ultrasound;Therapeutic activities;Therapeutic exercise;Neuromuscular re-education;Balance training;Patient/family education;Manual techniques;Dry needling;Taping;Vasopneumatic Device;Passive range of motion;Scar mobilization    PT Next Visit Plan review/ update HEP PRN, capture FOTO staus at visit 6 ,  knee mobs and ROM, bike vs nu-step, gait training, Vaso  for pain/ swelling, begin gait with SPC    PT Home Exercise Plan [id number] - heel slide with strap, seated heel slide, SLR, quad set, sidelying hip abduction           Patient will benefit from skilled therapeutic intervention in order to improve the following deficits and impairments:  Abnormal gait, Improper body mechanics, Increased muscle spasms, Decreased strength, Postural dysfunction, Decreased activity tolerance, Decreased endurance, Pain, Decreased range of motion  Visit Diagnosis: Chronic pain of right knee  Stiffness of right knee, not elsewhere classified  Abnormal gait  Muscle weakness (generalized)     Problem List Patient Active Problem List   Diagnosis Date Noted  . Status post total right knee replacement 02/23/2020  . Primary osteoarthritis of right knee 02/22/2020  . Acute seasonal allergic rhinitis 12/19/2019  . Pre-op evaluation 10/23/2019  . Murmur 12/10/2015  . Pes planus of both feet 01/07/2015  . Depression 01/07/2015  . Female cystocele 06/26/2014  . Healthcare maintenance 12/17/2012  . Osteoarthritis 06/21/2010  . Hyperlipidemia 05/31/2009  . DIVERTICULOSIS, COLON 10/16/2008  . AORTIC STENOSIS, MILD 06/02/2008  . Diabetes mellitus type 2, controlled, without complications (HCC) 12/06/2006  . Obesity (BMI 30-39.9) 12/06/2006  . SICKLE CELL TRAIT 12/06/2006  . HYPERTENSION, BENIGN SYSTEMIC 12/06/2006  . CORONARY, ARTERIOSCLEROSIS 12/06/2006    12/08/2006, PTA 04/05/2020, 9:24 AM  [facility] [address] [place], [place], [place] Phone: [phone]   Fax:  [phone]  Name: Terri Estrada MRN: [name] Date of Birth: 03-12-1951

## 2020-04-06 ENCOUNTER — Encounter: Payer: Self-pay | Admitting: Orthopaedic Surgery

## 2020-04-06 ENCOUNTER — Ambulatory Visit (INDEPENDENT_AMBULATORY_CARE_PROVIDER_SITE_OTHER): Payer: Medicare HMO | Admitting: Orthopaedic Surgery

## 2020-04-06 ENCOUNTER — Ambulatory Visit (INDEPENDENT_AMBULATORY_CARE_PROVIDER_SITE_OTHER): Payer: Medicare HMO

## 2020-04-06 VITALS — Ht 62.0 in | Wt 196.0 lb

## 2020-04-06 DIAGNOSIS — Z96651 Presence of right artificial knee joint: Secondary | ICD-10-CM | POA: Diagnosis not present

## 2020-04-06 NOTE — Progress Notes (Signed)
Post-Op Visit Note   Patient: Terri Estrada           Date of Birth: 06/02/1951           MRN: [id number] Visit Date: 04/06/2020 PCP: Ronnald Ramp, MD   Assessment & Plan:  Chief Complaint:  Chief Complaint  Patient presents with  . Right Knee - Routine Post Op    Right TKA DOS 02-23-2020     Visit Diagnoses:  1. Status post total right knee replacement     Plan: [name] is a 6-week status post right total knee replacement.  Ambulating with a walker.  She still has some swelling.  She continues to wear thigh-high TED hose.  Her range of motion is progressing with physical therapy.  Most recent measurement was 92 degrees.  Surgical scar is healed without any signs of infection.  Dental prophylaxis reinforced.  Continue outpatient PT.  Recheck in 6 weeks.  Patient may drive at this point if she feels comfortable doing so.  Follow-Up Instructions: Return in about 6 weeks (around 05/18/2020).   Orders:  Orders Placed This Encounter  Procedures  . XR Knee 1-2 Views Right   No orders of the defined types were placed in this encounter.   Imaging: XR Knee 1-2 Views Right  Result Date: 04/06/2020 Stable total knee replacement in good alignment    PMFS History: Patient Active Problem List   Diagnosis Date Noted  . Status post total right knee replacement 02/23/2020  . Primary osteoarthritis of right knee 02/22/2020  . Acute seasonal allergic rhinitis 12/19/2019  . Pre-op evaluation 10/23/2019  . Murmur 12/10/2015  . Pes planus of both feet 01/07/2015  . Depression 01/07/2015  . Female cystocele 06/26/2014  . Healthcare maintenance 12/17/2012  . Osteoarthritis 06/21/2010  . Hyperlipidemia 05/31/2009  . DIVERTICULOSIS, COLON 10/16/2008  . AORTIC STENOSIS, MILD 06/02/2008  . Diabetes mellitus type 2, controlled, without complications (HCC) 12/06/2006  . Obesity (BMI 30-39.9) 12/06/2006  . SICKLE CELL TRAIT 12/06/2006  . HYPERTENSION, BENIGN SYSTEMIC  12/06/2006  . CORONARY, ARTERIOSCLEROSIS 12/06/2006   Past Medical History:  Diagnosis Date  . Arthritis   . CAD in native artery    a. MI 2001 s/p PTCA/stenting to mid-distal junction of RCA - residual dx treated medically.  . Depression    pt reports having it intermittently  . Diverticulosis of colon (without mention of hemorrhage)   . Essential hypertension, benign   . MI (myocardial infarction) (HCC)   . Mild pulmonary hypertension (HCC)   . Obesity, unspecified   . Onychia and paronychia of toe   . Osteoarthrosis, unspecified whether generalized or localized, unspecified site   . Other and unspecified hyperlipidemia   . Sickle-cell trait (HCC)   . Sinus bradycardia    a. HR 40s even on low dose metoprolol - BB stopped with resolution.  . Streptococcal sore throat   . Type II or unspecified type diabetes mellitus without mention of complication, not stated as uncontrolled     Family History  Problem Relation Age of Onset  . Sickle cell anemia Daughter   . Hypertension Mother   . Diabetes type II Mother   . Diabetes type II Sister   . Breast cancer Sister     Past Surgical History:  Procedure Laterality Date  . ANGIOPLASTY  09/21/00  . CARDIAC CATHETERIZATION    . TOTAL KNEE ARTHROPLASTY Right 02/23/2020   Procedure: RIGHT TOTAL KNEE ARTHROPLASTY;  Surgeon: Tarry Kos, MD;  Location:  [facility] OR;  Service: Orthopedics;  Laterality: Right;   Social History   Occupational History  . Occupation: DAY Associate Professor: SELF-EMPLOYED  Tobacco Use  . Smoking status: Former Games developer  . Smokeless tobacco: Never Used  . Tobacco comment: quit nov 2001  Vaping Use  . Vaping Use: Never used  Substance and Sexual Activity  . Alcohol use: No  . Drug use: No  . Sexual activity: Yes    Birth control/protection: Post-menopausal

## 2020-04-07 ENCOUNTER — Ambulatory Visit: Payer: Medicare HMO | Admitting: Physical Therapy

## 2020-04-07 ENCOUNTER — Encounter: Payer: Self-pay | Admitting: Physical Therapy

## 2020-04-07 ENCOUNTER — Other Ambulatory Visit: Payer: Self-pay

## 2020-04-07 DIAGNOSIS — M25661 Stiffness of right knee, not elsewhere classified: Secondary | ICD-10-CM

## 2020-04-07 DIAGNOSIS — R269 Unspecified abnormalities of gait and mobility: Secondary | ICD-10-CM

## 2020-04-07 DIAGNOSIS — M6281 Muscle weakness (generalized): Secondary | ICD-10-CM | POA: Diagnosis not present

## 2020-04-07 DIAGNOSIS — M25561 Pain in right knee: Secondary | ICD-10-CM

## 2020-04-07 DIAGNOSIS — G8929 Other chronic pain: Secondary | ICD-10-CM | POA: Diagnosis not present

## 2020-04-07 NOTE — Therapy (Signed)
[facility] [address] [place], [place], [address] Phone: [phone]   Fax:  [phone]  Physical Therapy Treatment  Patient Details  Name: Terri Estrada MRN: [id number] Date of Birth: 1951-05-23 Referring Provider (PT):  Tarry Kos, MD    Encounter Date: 04/07/2020   PT End of Session - 04/07/20 0807    Visit Number 5    Number of Visits 17    Date for PT Re-Evaluation 05/07/20    Authorization Type MCR: Kx mod at 15th visit    PT Start Time 0800    PT Stop Time 0900    PT Time Calculation (min) 60 min           Past Medical History:  Diagnosis Date  . Arthritis   . CAD in native artery    a. MI 2001 s/p PTCA/stenting to mid-distal junction of RCA - residual dx treated medically.  . Depression    pt reports having it intermittently  . Diverticulosis of colon (without mention of hemorrhage)   . Essential hypertension, benign   . MI (myocardial infarction) (HCC)   . Mild pulmonary hypertension (HCC)   . Obesity, unspecified   . Onychia and paronychia of toe   . Osteoarthrosis, unspecified whether generalized or localized, unspecified site   . Other and unspecified hyperlipidemia   . Sickle-cell trait (HCC)   . Sinus bradycardia    a. HR 40s even on low dose metoprolol - BB stopped with resolution.  . Streptococcal sore throat   . Type II or unspecified type diabetes mellitus without mention of complication, not stated as uncontrolled     Past Surgical History:  Procedure Laterality Date  . ANGIOPLASTY  09/21/00  . CARDIAC CATHETERIZATION    . TOTAL KNEE ARTHROPLASTY Right 02/23/2020   Procedure: RIGHT TOTAL KNEE ARTHROPLASTY;  Surgeon: Tarry Kos, MD;  Location: [facility] OR;  Service: Orthopedics;  Laterality: Right;    There were no vitals filed for this visit.   Subjective Assessment - 04/07/20 0807    Subjective 3/10 today, not as bad, stretching and massaging it help.    Currently in Pain? Yes     Pain Score 3     Pain Location Knee    Pain Descriptors / Indicators Aching;Tightness    Pain Type Surgical pain;Chronic pain                             [facility] Adult PT Treatment/Exercise - 04/07/20 0001      Knee/Hip Exercises: Stretches   Active Hamstring Stretch Limitations supine with strap     Hip Flexor Stretch Limitations gentle off side of mat x 3     Knee: Self-Stretch Limitations seated with over pressure, scoot stretch     Gastroc Stretch Limitations runners stretch at [employer]       Knee/Hip Exercises: Aerobic   Nustep L3 UE/LE x 6 minutes       Knee/Hip Exercises: Standing   Heel Raises 20 reps    Knee Flexion 10 reps    Functional Squat Limitations mini squats at RW     Other Standing Knee Exercises 3 way hip x 10 each bilateral       Knee/Hip Exercises: Seated   Long Arc Quad 20 reps    Sit to [employer] 10 reps   with UE support     Knee/Hip Exercises: Supine   Quad Sets Limitations heel propped  Short Arc [employer] 20 reps    Straight Leg Raises 10 reps    Straight Leg Raises Limitations 2 sets      Vasopneumatic   Number Minutes Vasopneumatic  15 minutes    Vasopnuematic Location  Knee    Vasopneumatic Pressure Medium    Vasopneumatic Temperature  34      Manual Therapy   Manual therapy comments patella mobs all planes, P/A mobs to tibia , PROM knee flexion x 3 , AISTM to right quad ,proximal scar massage                     PT Short Term Goals - 03/12/20 1003      PT SHORT TERM GOAL #1   Title pt to be I with intial HEP    Time 4    Period Weeks    Status New    Target Date 04/09/20      PT SHORT TERM GOAL #2   Title increase knee ROM to >/= 8 - 100 degrees for functional knee ROM progression with </= 5/10 pain    Time 4    Period Weeks    Status New    Target Date 04/09/20      PT SHORT TERM GOAL #3   Title pt to be able to transitoin from using the RW to [facility] safely to maximzie mobility.    Time 4    Period Weeks     Status New    Target Date 04/09/20             PT Long Term Goals - 03/12/20 1004      PT LONG TERM GOAL #1   Title pt to increaes knee ROM to >/= 3 to 115 degrees for functional ROM required for functional and efficient gait pattern    Time 8    Period Weeks    Status New    Target Date 05/07/20      PT LONG TERM GOAL #2   Title pt to increase RLE gross strength to >/= 4+/5 to promote hip/ knee stability    Time 8    Period Weeks    Status New    Target Date 05/07/20      PT LONG TERM GOAL #3   Title pt tobe able to sit, stand and walk for >/= 45 min with SPC for functional endurance required for in home and community ambulation    Time 8    Period Weeks    Target Date 05/07/20      PT LONG TERM GOAL #4   Title increase FOTO score to </= 28% limited to demo improvement in function    Time 8    Period Weeks    Status New    Target Date 05/07/20      PT LONG TERM GOAL #5   Title pt to be I with all HEP given to maintain and progress current level of function    Time 8    Period Weeks    Status New    Target Date 05/07/20                 Plan - 04/07/20 0845    Clinical Impression Statement Pt saw MD yesterday who was pleased with her progress. She continues to lack full active knee extension, passively she can reach neutrral. Knee flexion ROM continues to improve. Continued with mobs and manual to increase knee ROM. Vaso at end  of session for edema management. Progressing toward goals.    PT Next Visit Plan review/ update HEP PRN, capture FOTO staus at visit 6 ,  knee mobs and ROM, bike vs nu-step, gait training, Vaso for pain/ swelling, begin gait with SPC    PT Home Exercise Plan [id number] - heel slide with strap, seated heel slide, SLR, quad set, sidelying hip abduction           Patient will benefit from skilled therapeutic intervention in order to improve the following deficits and impairments:  Abnormal gait, Improper body mechanics, Increased  muscle spasms, Decreased strength, Postural dysfunction, Decreased activity tolerance, Decreased endurance, Pain, Decreased range of motion  Visit Diagnosis: Chronic pain of right knee  Stiffness of right knee, not elsewhere classified  Abnormal gait  Muscle weakness (generalized)     Problem List Patient Active Problem List   Diagnosis Date Noted  . Status post total right knee replacement 02/23/2020  . Primary osteoarthritis of right knee 02/22/2020  . Acute seasonal allergic rhinitis 12/19/2019  . Pre-op evaluation 10/23/2019  . Murmur 12/10/2015  . Pes planus of both feet 01/07/2015  . Depression 01/07/2015  . Female cystocele 06/26/2014  . Healthcare maintenance 12/17/2012  . Osteoarthritis 06/21/2010  . Hyperlipidemia 05/31/2009  . DIVERTICULOSIS, COLON 10/16/2008  . AORTIC STENOSIS, MILD 06/02/2008  . Diabetes mellitus type 2, controlled, without complications (HCC) 12/06/2006  . Obesity (BMI 30-39.9) 12/06/2006  . SICKLE CELL TRAIT 12/06/2006  . HYPERTENSION, BENIGN SYSTEMIC 12/06/2006  . CORONARY, ARTERIOSCLEROSIS 12/06/2006    Sherrie Mustache, PTA 04/07/2020, 8:47 AM  [facility] [address] [place], [place], [address] Phone: [phone]   Fax:  [phone]  Name: Esmirna Ravan MRN: [id number] Date of Birth: June 23, 1951

## 2020-04-13 ENCOUNTER — Ambulatory Visit: Payer: Medicare HMO | Attending: Orthopaedic Surgery | Admitting: Physical Therapy

## 2020-04-13 ENCOUNTER — Encounter: Payer: Self-pay | Admitting: Physical Therapy

## 2020-04-13 ENCOUNTER — Other Ambulatory Visit: Payer: Self-pay

## 2020-04-13 DIAGNOSIS — G8929 Other chronic pain: Secondary | ICD-10-CM

## 2020-04-13 DIAGNOSIS — M6281 Muscle weakness (generalized): Secondary | ICD-10-CM | POA: Diagnosis not present

## 2020-04-13 DIAGNOSIS — R269 Unspecified abnormalities of gait and mobility: Secondary | ICD-10-CM | POA: Insufficient documentation

## 2020-04-13 DIAGNOSIS — M25661 Stiffness of right knee, not elsewhere classified: Secondary | ICD-10-CM | POA: Diagnosis not present

## 2020-04-13 DIAGNOSIS — M25561 Pain in right knee: Secondary | ICD-10-CM | POA: Diagnosis not present

## 2020-04-13 NOTE — Therapy (Signed)
[facility] [address] [place], [place], [address] Phone: [phone]   Fax:  [phone]  Physical Therapy Treatment  Patient Details  Name: Terri Estrada MRN: [id number] Date of Birth: 1950/12/21 Referring Provider (PT):  Tarry Kos, MD    Encounter Date: 04/13/2020   PT End of Session - 04/13/20 0805    Visit Number 6    Number of Visits 17    Date for PT Re-Evaluation 05/07/20    Authorization Type MCR: Kx mod at 15th visit    PT Start Time 0755    PT Stop Time 0852    PT Time Calculation (min) 57 min           Past Medical History:  Diagnosis Date  . Arthritis   . CAD in native artery    a. MI 2001 s/p PTCA/stenting to mid-distal junction of RCA - residual dx treated medically.  . Depression    pt reports having it intermittently  . Diverticulosis of colon (without mention of hemorrhage)   . Essential hypertension, benign   . MI (myocardial infarction) (HCC)   . Mild pulmonary hypertension (HCC)   . Obesity, unspecified   . Onychia and paronychia of toe   . Osteoarthrosis, unspecified whether generalized or localized, unspecified site   . Other and unspecified hyperlipidemia   . Sickle-cell trait (HCC)   . Sinus bradycardia    a. HR 40s even on low dose metoprolol - BB stopped with resolution.  . Streptococcal sore throat   . Type II or unspecified type diabetes mellitus without mention of complication, not stated as uncontrolled     Past Surgical History:  Procedure Laterality Date  . ANGIOPLASTY  09/21/00  . CARDIAC CATHETERIZATION    . TOTAL KNEE ARTHROPLASTY Right 02/23/2020   Procedure: RIGHT TOTAL KNEE ARTHROPLASTY;  Surgeon: Tarry Kos, MD;  Location: [facility] OR;  Service: Orthopedics;  Laterality: Right;    There were no vitals filed for this visit.   Subjective Assessment - 04/13/20 0757    Currently in Pain? Yes    Pain Score 1     Pain Location Knee    Pain Orientation Right     Pain Descriptors / Indicators Aching;Tightness    Pain Type Surgical pain;Chronic pain    Aggravating Factors  activity on feet    Pain Relieving Factors marches, massage              [facility] PT Assessment - 04/13/20 0001      Observation/Other Assessments   Focus on Therapeutic Outcomes (FOTO)  44% limited , visit 6       AROM   Right Knee Extension 6    Right Knee Flexion 95      PROM   Right Knee Flexion 97                         [facility] Adult PT Treatment/Exercise - 04/13/20 0001      Ambulation/Gait   Ambulation/Gait Yes    Ambulation/Gait Assistance 6: Modified independent (Device/Increase time)    Assistive device Straight cane    Gait Pattern Step-through pattern    Ambulation Surface Indoor    Gait Comments Good safety with SPC in clinic. She is using SPC indoor      Knee/Hip Exercises: Stretches   Active Hamstring Stretch Limitations supine with strap     Hip Flexor Stretch Limitations gentle off side of mat x  3       Knee/Hip Exercises: Aerobic   Recumbent Bike partial revolutions x 5 minutes     Nustep L3 UE/LE x 5 minutes       Knee/Hip Exercises: Standing   Heel Raises 20 reps    Knee Flexion 20 reps    Knee Flexion Limitations 2#      Knee/Hip Exercises: Seated   Long Arc Quad 20 reps    Long Arc Quad Weight 2 lbs.      Knee/Hip Exercises: Supine   Quad Sets 20 reps    Quad Sets Limitations heel propped     Short Arc [employer] 20 reps    Short Arc Quad Sets Limitations 2#    Straight Leg Raises 10 reps   10 reps no wt, focus in initial quad set    Straight Leg Raises Limitations 2#      Vasopneumatic   Number Minutes Vasopneumatic  15 minutes    Vasopnuematic Location  Knee    Vasopneumatic Pressure Medium    Vasopneumatic Temperature  34      Manual Therapy   Manual therapy comments patella mobs all planes, P/A mobs to tibia , PROM knee flexion x 3                     PT Short Term Goals - 03/12/20 1003       PT SHORT TERM GOAL #1   Title pt to be I with intial HEP    Time 4    Period Weeks    Status New    Target Date 04/09/20      PT SHORT TERM GOAL #2   Title increase knee ROM to >/= 8 - 100 degrees for functional knee ROM progression with </= 5/10 pain    Time 4    Period Weeks    Status New    Target Date 04/09/20      PT SHORT TERM GOAL #3   Title pt to be able to transitoin from using the RW to [facility] safely to maximzie mobility.    Time 4    Period Weeks    Status New    Target Date 04/09/20             PT Long Term Goals - 03/12/20 1004      PT LONG TERM GOAL #1   Title pt to increaes knee ROM to >/= 3 to 115 degrees for functional ROM required for functional and efficient gait pattern    Time 8    Period Weeks    Status New    Target Date 05/07/20      PT LONG TERM GOAL #2   Title pt to increase RLE gross strength to >/= 4+/5 to promote hip/ knee stability    Time 8    Period Weeks    Status New    Target Date 05/07/20      PT LONG TERM GOAL #3   Title pt tobe able to sit, stand and walk for >/= 45 min with [facility] for functional endurance required for in home and community ambulation    Time 8    Period Weeks    Target Date 05/07/20      PT LONG TERM GOAL #4   Title increase FOTO score to </= 28% limited to demo improvement in function    Time 8    Period Weeks    Status New    Target  Date 05/07/20      PT LONG TERM GOAL #5   Title pt to be I with all HEP given to maintain and progress current level of function    Time 8    Period Weeks    Status New    Target Date 05/07/20                 Plan - 04/13/20 0845    Clinical Impression Statement Improving ROM and enduarance in weightbearing. SPC in clinic was safe and with good sequencing. She is using SPC indoors. Encouraged her to try walking in her level, paved driveway for endurance training. She is eager to return to wlaking for exercise. Able to start bike today however unable to make full  revolutions.    PT Next Visit Plan review/ update HEP PRN, capture FOTO staus at visit 12 ,  knee mobs and ROM, TRY REC BIKE AGAIN, gait training, Vaso for pain/ swelling,continuegait with [facility]           Patient will benefit from skilled therapeutic intervention in order to improve the following deficits and impairments:  Abnormal gait, Improper body mechanics, Increased muscle spasms, Decreased strength, Postural dysfunction, Decreased activity tolerance, Decreased endurance, Pain, Decreased range of motion  Visit Diagnosis: Chronic pain of right knee  Stiffness of right knee, not elsewhere classified  Abnormal gait  Muscle weakness (generalized)     Problem List Patient Active Problem List   Diagnosis Date Noted  . Status post total right knee replacement 02/23/2020  . Primary osteoarthritis of right knee 02/22/2020  . Acute seasonal allergic rhinitis 12/19/2019  . Pre-op evaluation 10/23/2019  . Murmur 12/10/2015  . Pes planus of both feet 01/07/2015  . Depression 01/07/2015  . Female cystocele 06/26/2014  . Healthcare maintenance 12/17/2012  . Osteoarthritis 06/21/2010  . Hyperlipidemia 05/31/2009  . DIVERTICULOSIS, COLON 10/16/2008  . AORTIC STENOSIS, MILD 06/02/2008  . Diabetes mellitus type 2, controlled, without complications (HCC) 12/06/2006  . Obesity (BMI 30-39.9) 12/06/2006  . SICKLE CELL TRAIT 12/06/2006  . HYPERTENSION, BENIGN SYSTEMIC 12/06/2006  . CORONARY, ARTERIOSCLEROSIS 12/06/2006    Sherrie Mustache, PTA 04/13/2020, 8:48 AM  [facility] [address] [place], [place], [address] Phone: [phone]   Fax:  [phone]  Name: Mehek Grega MRN: [id number] Date of Birth: 1951/02/22

## 2020-04-15 ENCOUNTER — Encounter: Payer: Self-pay | Admitting: Physical Therapy

## 2020-04-15 ENCOUNTER — Other Ambulatory Visit: Payer: Self-pay

## 2020-04-15 ENCOUNTER — Ambulatory Visit: Payer: Medicare HMO | Admitting: Physical Therapy

## 2020-04-15 DIAGNOSIS — R269 Unspecified abnormalities of gait and mobility: Secondary | ICD-10-CM

## 2020-04-15 DIAGNOSIS — M25561 Pain in right knee: Secondary | ICD-10-CM | POA: Diagnosis not present

## 2020-04-15 DIAGNOSIS — G8929 Other chronic pain: Secondary | ICD-10-CM

## 2020-04-15 DIAGNOSIS — M25661 Stiffness of right knee, not elsewhere classified: Secondary | ICD-10-CM

## 2020-04-15 DIAGNOSIS — M6281 Muscle weakness (generalized): Secondary | ICD-10-CM | POA: Diagnosis not present

## 2020-04-15 NOTE — Therapy (Signed)
[facility] [address] [place], [place], [address] Phone: [phone]   Fax:  [phone]  Physical Therapy Treatment  Patient Details  Name: Terri Estrada MRN: [id number] Date of Birth: October 24, 1950 Referring Provider (PT):  Tarry Kos, MD    Encounter Date: 04/15/2020   PT End of Session - 04/15/20 0803    Visit Number 7    Number of Visits 17    Date for PT Re-Evaluation 05/07/20    Authorization Type MCR: Kx mod at 15th visit    PT Start Time 0800    PT Stop Time 0900    PT Time Calculation (min) 60 min           Past Medical History:  Diagnosis Date  . Arthritis   . CAD in native artery    a. MI 2001 s/p PTCA/stenting to mid-distal junction of RCA - residual dx treated medically.  . Depression    pt reports having it intermittently  . Diverticulosis of colon (without mention of hemorrhage)   . Essential hypertension, benign   . MI (myocardial infarction) (HCC)   . Mild pulmonary hypertension (HCC)   . Obesity, unspecified   . Onychia and paronychia of toe   . Osteoarthrosis, unspecified whether generalized or localized, unspecified site   . Other and unspecified hyperlipidemia   . Sickle-cell trait (HCC)   . Sinus bradycardia    a. HR 40s even on low dose metoprolol - BB stopped with resolution.  . Streptococcal sore throat   . Type II or unspecified type diabetes mellitus without mention of complication, not stated as uncontrolled     Past Surgical History:  Procedure Laterality Date  . ANGIOPLASTY  09/21/00  . CARDIAC CATHETERIZATION    . TOTAL KNEE ARTHROPLASTY Right 02/23/2020   Procedure: RIGHT TOTAL KNEE ARTHROPLASTY;  Surgeon: Tarry Kos, MD;  Location: [facility];  Service: Orthopedics;  Laterality: Right;    There were no vitals filed for this visit.   Subjective Assessment - 04/15/20 0803    Subjective I have more problems with the rainy weather.    Currently in Pain? Yes    Pain Score  4     Pain Location Knee    Pain Orientation Right    Pain Descriptors / Indicators Aching;Tightness    Pain Type Chronic pain;Surgical pain              [facility] PT Assessment - 04/15/20 0001      AROM   Right Knee Extension --   quad lag    Right Knee Flexion 98                         [facility] Adult PT Treatment/Exercise - 04/15/20 0001      Knee/Hip Exercises: Aerobic   Recumbent Bike partial revolutions x 5 minutes    full revolutions at end of session for 2 minutes   Nustep L4 LE only x 5 minutes       Knee/Hip Exercises: Standing   Heel Raises 20 reps    Knee Flexion 10 reps    Lateral Step Up 10 reps;Hand Hold: 1;Step Height: 6"    Forward Step Up 15 reps;Hand Hold: 1;Step Height: 6"    Forward Step Up Limitations cues for technique     Functional Squat Limitations sink squats x 15    SLS 2 sec Right , tandem stance 20 sec  Knee/Hip Exercises: Seated   Long Arc Quad 20 reps    Long Arc Quad Weight 3 lbs.    Hamstring Curl 20 reps    Hamstring Limitations red     Sit to [employer] 10 reps      Knee/Hip Exercises: Supine   Short Arc Quad Sets 20 reps    Short Arc Quad Sets Limitations 3#      Vasopneumatic   Number Minutes Vasopneumatic  15 minutes    Vasopnuematic Location  Knee    Vasopneumatic Pressure Low    Vasopneumatic Temperature  34      Manual Therapy   Manual therapy comments IASTM lateral quad                     PT Short Term Goals - 03/12/20 1003      PT SHORT TERM GOAL #1   Title pt to be I with intial HEP    Time 4    Period Weeks    Status New    Target Date 04/09/20      PT SHORT TERM GOAL #2   Title increase knee ROM to >/= 8 - 100 degrees for functional knee ROM progression with </= 5/10 pain    Time 4    Period Weeks    Status New    Target Date 04/09/20      PT SHORT TERM GOAL #3   Title pt to be able to transitoin from using the RW to [facility] safely to [place] mobility.    Time 4    Period Weeks     Status New    Target Date 04/09/20             PT Long Term Goals - 03/12/20 1004      PT LONG TERM GOAL #1   Title pt to increaes knee ROM to >/= 3 to 115 degrees for functional ROM required for functional and efficient gait pattern    Time 8    Period Weeks    Status New    Target Date 05/07/20      PT LONG TERM GOAL #2   Title pt to increase RLE gross strength to >/= 4+/5 to promote hip/ knee stability    Time 8    Period Weeks    Status New    Target Date 05/07/20      PT LONG TERM GOAL #3   Title pt tobe able to sit, stand and walk for >/= 45 min with SPC for functional endurance required for in home and community ambulation    Time 8    Period Weeks    Target Date 05/07/20      PT LONG TERM GOAL #4   Title increase FOTO score to </= 28% limited to demo improvement in function    Time 8    Period Weeks    Status New    Target Date 05/07/20      PT LONG TERM GOAL #5   Title pt to be I with all HEP given to maintain and progress current level of function    Time 8    Period Weeks    Status New    Target Date 05/07/20                 Plan - 04/15/20 0804    Clinical Impression Statement Pt reports walking in the driveway with [facility] for exercise for 5 minutes and did not have  increased pain. She brought RW today due to the weather. Increased closed chain challenges with good tolerance. ROM measures 98 after therex. She was able to get on rec bike at end of session and make full revolutions.    PT Next Visit Plan review/ update HEP PRN, capture FOTO staus at visit 12 ,  knee mobs and ROM, TRY REC BIKE AGAIN, gait training, Vaso for pain/ swelling,continuegait with SPC    PT Home Exercise Plan [id number] - heel slide with strap, seated heel slide, SLR, quad set, sidelying hip abduction, verbally added step ups on her step and SLS trials    Consulted and Agree with Plan of Care Patient           Patient will benefit from skilled therapeutic intervention in  order to improve the following deficits and impairments:  Abnormal gait, Improper body mechanics, Increased muscle spasms, Decreased strength, Postural dysfunction, Decreased activity tolerance, Decreased endurance, Pain, Decreased range of motion  Visit Diagnosis: Chronic pain of right knee  Stiffness of right knee, not elsewhere classified  Abnormal gait     Problem List Patient Active Problem List   Diagnosis Date Noted  . Status post total right knee replacement 02/23/2020  . Primary osteoarthritis of right knee 02/22/2020  . Acute seasonal allergic rhinitis 12/19/2019  . Pre-op evaluation 10/23/2019  . Murmur 12/10/2015  . Pes planus of both feet 01/07/2015  . Depression 01/07/2015  . Female cystocele 06/26/2014  . Healthcare maintenance 12/17/2012  . Osteoarthritis 06/21/2010  . Hyperlipidemia 05/31/2009  . DIVERTICULOSIS, COLON 10/16/2008  . AORTIC STENOSIS, MILD 06/02/2008  . Diabetes mellitus type 2, controlled, without complications (HCC) 12/06/2006  . Obesity (BMI 30-39.9) 12/06/2006  . SICKLE CELL TRAIT 12/06/2006  . HYPERTENSION, BENIGN SYSTEMIC 12/06/2006  . CORONARY, ARTERIOSCLEROSIS 12/06/2006    Sherrie Mustache, PTA 04/15/2020, 8:57 AM  [facility] [address] [place], [place], [address] Phone: [phone]   Fax:  [phone]  Name: Graylyn Bunney MRN: [id number] Date of Birth: 1950-12-03

## 2020-04-23 ENCOUNTER — Ambulatory Visit: Payer: Medicare HMO | Admitting: Physical Therapy

## 2020-04-23 ENCOUNTER — Encounter: Payer: Self-pay | Admitting: Physical Therapy

## 2020-04-23 ENCOUNTER — Other Ambulatory Visit: Payer: Self-pay

## 2020-04-23 DIAGNOSIS — M6281 Muscle weakness (generalized): Secondary | ICD-10-CM | POA: Diagnosis not present

## 2020-04-23 DIAGNOSIS — G8929 Other chronic pain: Secondary | ICD-10-CM

## 2020-04-23 DIAGNOSIS — M25661 Stiffness of right knee, not elsewhere classified: Secondary | ICD-10-CM

## 2020-04-23 DIAGNOSIS — R269 Unspecified abnormalities of gait and mobility: Secondary | ICD-10-CM

## 2020-04-23 DIAGNOSIS — M25561 Pain in right knee: Secondary | ICD-10-CM | POA: Diagnosis not present

## 2020-04-23 NOTE — Therapy (Signed)
[place], [place], [address] Phone: [phone]   Fax:  [phone]  Physical Therapy Treatment  Patient Details  Name: Terri Estrada MRN: [id number] Date of Birth: Oct 09, 1951 Referring Provider (PT):  Leandrew Koyanagi, MD    Encounter Date: 04/23/2020   PT End of Session - 04/23/20 0837    Visit Number 8    Number of Visits 17    Date for PT Re-Evaluation 05/07/20    Authorization Type MCR: Kx mod at 15th visit    Progress Note Due on Visit 10    PT Start Time 0833    PT Stop Time 0923    PT Time Calculation (min) 50 min    Activity Tolerance Patient tolerated treatment well    Behavior During Therapy [facility] for tasks assessed/performed           Past Medical History:  Diagnosis Date  . Arthritis   . CAD in native artery    a. MI 2001 s/p PTCA/stenting to mid-distal junction of RCA - residual dx treated medically.  . Depression    pt reports having it intermittently  . Diverticulosis of colon (without mention of hemorrhage)   . Essential hypertension, benign   . MI (myocardial infarction) ([place])   . Mild pulmonary hypertension ([place])   . Obesity, unspecified   . Onychia and paronychia of toe   . Osteoarthrosis, unspecified whether generalized or localized, unspecified site   . Other and unspecified hyperlipidemia   . Sickle-cell trait ([place])   . Sinus bradycardia    a. HR 40s even on low dose metoprolol - BB stopped with resolution.  . Streptococcal sore throat   . Type II or unspecified type diabetes mellitus without mention of complication, not stated as uncontrolled     Past Surgical History:  Procedure Laterality Date  . ANGIOPLASTY  09/21/00  . CARDIAC CATHETERIZATION    . TOTAL KNEE ARTHROPLASTY Right 02/23/2020   Procedure: RIGHT TOTAL KNEE ARTHROPLASTY;  Surgeon: Leandrew Koyanagi, MD;  Location: [place];  Service: Orthopedics;  Laterality: Right;    There were no vitals filed for  this visit.   Subjective Assessment - 04/23/20 0837    Subjective "I don't have any pain but the biggest issue is stiffness"    Currently in Pain? Yes    Pain Score 0-No pain    Pain Onset More than a month ago    Pain Frequency Intermittent    Aggravating Factors  being on feet    Pain Relieving Factors exercise, massage              [facility] PT Assessment - 04/23/20 0001      Assessment   Medical Diagnosis Status post total right knee replacement Z96.651    Referring Provider (PT)  Leandrew Koyanagi, MD       AROM   Right Knee Extension 5    Right Knee Flexion 95                         [facility] Adult PT Treatment/Exercise - 04/23/20 0001      Knee/Hip Exercises: Aerobic   Recumbent Bike x 5 min full revolutions in a slow controlled fashion      Knee/Hip Exercises: Standing   Heel Raises 2 sets;20 reps    Terminal Knee Extension Strengthening;2 sets;15 reps   with R Foot on 2 inch step and blue band in poplit  space     Knee/Hip Exercises: Seated   Long Arc Quad 20 reps    Long Arc Quad Weight 3 lbs.    Sit to Sand 15 reps   2 x 2 sets, 2nd set with 3 sec hover     Knee/Hip Exercises: Supine   Heel Slides 1 set;20 reps;Right;AROM    Straight Leg Raises 2 sets;15 reps   with mia     Vasopneumatic   Number Minutes Vasopneumatic  10 minutes    Vasopnuematic Location  Knee    Vasopneumatic Pressure Low    Vasopneumatic Temperature  34      Manual Therapy   Manual Therapy Joint mobilization    Manual therapy comments IASTM lateral/ anterior distal  quad     Joint Mobilization tibiofemoral AP grade IV mobs,                     PT Short Term Goals - 04/23/20 5400      PT SHORT TERM GOAL #1   Title pt to be I with intial HEP    Period Weeks    Status Achieved      PT SHORT TERM GOAL #2   Title increase knee ROM to >/= 8 - 100 degrees for functional knee ROM progression with </= 5/10 pain    Baseline not med knee flexion at 95 degrees today      Period Weeks    Status Partially Met      PT SHORT TERM GOAL #3   Title pt to be able to transitoin from using the RW to [facility] safely to [facility] mobility.    Period Weeks    Status Achieved             PT Long Term Goals - 04/23/20 8676      PT LONG TERM GOAL #1   Title pt to increaes knee ROM to >/= 3 to 115 degrees for functional ROM required for functional and efficient gait pattern    Period Weeks    Status On-going      PT LONG TERM GOAL #2   Title pt to increase RLE gross strength to >/= 4+/5 to promote hip/ knee stability    Period Weeks    Status On-going      PT LONG TERM GOAL #3   Title pt tobe able to sit, stand and walk for >/= 45 min with SPC for functional endurance required for in home and community ambulation    Period Weeks    Status On-going      PT LONG TERM GOAL #4   Title increase FOTO score to </= 28% limited to demo improvement in function    Period Weeks    Status On-going      PT LONG TERM GOAL #5   Title pt to be I with all HEP given to maintain and progress current level of function    Period Weeks    Status On-going                 Plan - 04/23/20 0903    Clinical Impression Statement Mrs Kever reports no pain today noting mostly stiffness. She has a knee ROM 5 - 95 degrees today. she met or partially met all STG's today, and is progressing well to her LTGs.  She was able to get full revolutions on the bike today an dnoted feeling better after she finished. continued gross hip/ knee strengthening which she does  fatigue but is able to complete the exercise. continued vaso end of session.    PT Treatment/Interventions ADLs/Self Care Home Management;Electrical Stimulation;Iontophoresis 56m/ml Dexamethasone;Cryotherapy;Moist Heat;Ultrasound;Therapeutic activities;Therapeutic exercise;Neuromuscular re-education;Balance training;Patient/family education;Manual techniques;Dry needling;Taping;Vasopneumatic Device;Passive range of motion;Scar  mobilization    PT Next Visit Plan review/ update HEP PRN, capture FOTO staus at visit 12 ,  knee mobs and ROM,  gait training, Vaso for pain/ swelling,continuegait with SPC    PT Home Exercise Plan [id number] - heel slide with strap, seated heel slide, SLR, quad set, sidelying hip abduction, verbally added step ups on her step and SLS trials    Consulted and Agree with Plan of Care Patient           Patient will benefit from skilled therapeutic intervention in order to improve the following deficits and impairments:  Abnormal gait, Improper body mechanics, Increased muscle spasms, Decreased strength, Postural dysfunction, Decreased activity tolerance, Decreased endurance, Pain, Decreased range of motion  Visit Diagnosis: Chronic pain of right knee  Stiffness of right knee, not elsewhere classified  Abnormal gait  Muscle weakness (generalized)     Problem List Patient Active Problem List   Diagnosis Date Noted  . Status post total right knee replacement 02/23/2020  . Primary osteoarthritis of right knee 02/22/2020  . Acute seasonal allergic rhinitis 12/19/2019  . Pre-op evaluation 10/23/2019  . Murmur 12/10/2015  . Pes planus of both feet 01/07/2015  . Depression 01/07/2015  . Female cystocele 06/26/2014  . Healthcare maintenance 12/17/2012  . Osteoarthritis 06/21/2010  . Hyperlipidemia 05/31/2009  . DIVERTICULOSIS, COLON 10/16/2008  . AORTIC STENOSIS, MILD 06/02/2008  . Diabetes mellitus type 2, controlled, without complications ([place] [id number] . Obesity (BMI 30-39.9) 12/06/2006  . SICKLE CELL TRAIT 12/06/2006  . HYPERTENSION, BENIGN SYSTEMIC 12/06/2006  . CORONARY, ARTERIOSCLEROSIS 12/06/2006    KStarr LakePT, DPT, LAT, ATC  04/23/20  9:19 AM      [facility] [address]Phone: [phone]  Fax:  [phone] Name: DBerklee BatteyMRN: [id number] of Birth:  [birth year]

## 2020-04-27 ENCOUNTER — Encounter: Payer: Self-pay | Admitting: Physical Therapy

## 2020-04-27 ENCOUNTER — Ambulatory Visit: Payer: Medicare HMO | Admitting: Physical Therapy

## 2020-04-27 ENCOUNTER — Other Ambulatory Visit: Payer: Self-pay

## 2020-04-27 DIAGNOSIS — M6281 Muscle weakness (generalized): Secondary | ICD-10-CM

## 2020-04-27 DIAGNOSIS — M25561 Pain in right knee: Secondary | ICD-10-CM | POA: Diagnosis not present

## 2020-04-27 DIAGNOSIS — G8929 Other chronic pain: Secondary | ICD-10-CM | POA: Diagnosis not present

## 2020-04-27 DIAGNOSIS — M25661 Stiffness of right knee, not elsewhere classified: Secondary | ICD-10-CM

## 2020-04-27 DIAGNOSIS — R269 Unspecified abnormalities of gait and mobility: Secondary | ICD-10-CM

## 2020-04-27 NOTE — Therapy (Signed)
[place], [place], [address] Phone: [phone]   Fax:  [phone]  Physical Therapy Treatment  Patient Details  Name: Terri Estrada MRN: [id number] Date of Birth: 1950-12-25 Referring Provider (PT):  Leandrew Koyanagi, MD    Encounter Date: 04/27/2020   PT End of Session - 04/27/20 0813    Visit Number 9    Number of Visits 17    Date for PT Re-Evaluation 05/07/20    Authorization Type MCR: Kx mod at 15th visit    PT Start Time 0800    PT Stop Time 0850    PT Time Calculation (min) 50 min           Past Medical History:  Diagnosis Date  . Arthritis   . CAD in native artery    a. MI 2001 s/p PTCA/stenting to mid-distal junction of RCA - residual dx treated medically.  . Depression    pt reports having it intermittently  . Diverticulosis of colon (without mention of hemorrhage)   . Essential hypertension, benign   . MI (myocardial infarction) ([place])   . Mild pulmonary hypertension ([place])   . Obesity, unspecified   . Onychia and paronychia of toe   . Osteoarthrosis, unspecified whether generalized or localized, unspecified site   . Other and unspecified hyperlipidemia   . Sickle-cell trait ([place])   . Sinus bradycardia    a. HR 40s even on low dose metoprolol - BB stopped with resolution.  . Streptococcal sore throat   . Type II or unspecified type diabetes mellitus without mention of complication, not stated as uncontrolled     Past Surgical History:  Procedure Laterality Date  . ANGIOPLASTY  09/21/00  . CARDIAC CATHETERIZATION    . TOTAL KNEE ARTHROPLASTY Right 02/23/2020   Procedure: RIGHT TOTAL KNEE ARTHROPLASTY;  Surgeon: Leandrew Koyanagi, MD;  Location: [place];  Service: Orthopedics;  Laterality: Right;    There were no vitals filed for this visit.   Subjective Assessment - 04/27/20 0805    Subjective Having some tightness on outside of knee today, 3/10. With the rain it was swollen  and huritng slot over the weekend.    Currently in Pain? Yes    Pain Location Knee    Pain Orientation Right    Pain Descriptors / Indicators Tightness    Pain Type Chronic pain    Aggravating Factors  weather    Pain Relieving Factors exercise, massage              [facility] PT Assessment - 04/27/20 0001      AROM   Right Knee Flexion 98                         [facility] Adult PT Treatment/Exercise - 04/27/20 0001      Knee/Hip Exercises: Aerobic   Recumbent Bike x 5 min full revolutions in a slow controlled fashion    Nustep x 5 minutes L5      Knee/Hip Exercises: Machines for Strengthening   Cybex Leg Press 1 plate single and bilateral       Knee/Hip Exercises: Standing   Forward Step Up 20 reps;Hand Hold: 1;Step Height: 8"    Forward Step Up Limitations cues for technique     SLS --      Knee/Hip Exercises: Seated   Hamstring Curl 20 reps    Hamstring Limitations green     Sit to  Sand 20 reps   then      Knee/Hip Exercises: Supine   Bridges 20 reps    Straight Leg Raises 20 reps      Knee/Hip Exercises: Sidelying   Hip ABduction 20 reps      Vasopneumatic   Number Minutes Vasopneumatic  10 minutes    Vasopnuematic Location  Knee    Vasopneumatic Pressure Low    Vasopneumatic Temperature  34      Manual Therapy   Manual therapy comments IASTM lateral/ anterior distal  quad                     PT Short Term Goals - 04/23/20 6808      PT SHORT TERM GOAL #1   Title pt to be I with intial HEP    Period Weeks    Status Achieved      PT SHORT TERM GOAL #2   Title increase knee ROM to >/= 8 - 100 degrees for functional knee ROM progression with </= 5/10 pain    Baseline not med knee flexion at 95 degrees today    Period Weeks    Status Partially Met      PT SHORT TERM GOAL #3   Title pt to be able to transitoin from using the RW to [facility] safely to [facility] mobility.    Period Weeks    Status Achieved             PT Long Term  Goals - 04/23/20 8110      PT LONG TERM GOAL #1   Title pt to increaes knee ROM to >/= 3 to 115 degrees for functional ROM required for functional and efficient gait pattern    Period Weeks    Status On-going      PT LONG TERM GOAL #2   Title pt to increase RLE gross strength to >/= 4+/5 to promote hip/ knee stability    Period Weeks    Status On-going      PT LONG TERM GOAL #3   Title pt tobe able to sit, stand and walk for >/= 45 min with SPC for functional endurance required for in home and community ambulation    Period Weeks    Status On-going      PT LONG TERM GOAL #4   Title increase FOTO score to </= 28% limited to demo improvement in function    Period Weeks    Status On-going      PT LONG TERM GOAL #5   Title pt to be I with all HEP given to maintain and progress current level of function    Period Weeks    Status On-going                 Plan - 04/27/20 1101    Clinical Impression Statement Pt arrives with [facility] and reports using no AD in home. Continued with knee strength and ROM.    PT Next Visit Plan review/ update HEP PRN, capture FOTO staus at visit 12 ,  knee mobs and ROM,  gait training, Vaso for pain/ swelling, proprioception    PT Home Exercise Plan [id number] - heel slide with strap, seated heel slide, SLR, quad set, sidelying hip abduction, verbally added step ups on her step and SLS trials           Patient will benefit from skilled therapeutic intervention in order to improve the following deficits and impairments:  Abnormal gait,  Improper body mechanics, Increased muscle spasms, Decreased strength, Postural dysfunction, Decreased activity tolerance, Decreased endurance, Pain, Decreased range of motion  Visit Diagnosis: Chronic pain of right knee  Stiffness of right knee, not elsewhere classified  Abnormal gait  Muscle weakness (generalized)     Problem List Patient Active Problem List   Diagnosis Date Noted  . Status post total right  knee replacement 02/23/2020  . Primary osteoarthritis of right knee 02/22/2020  . Acute seasonal allergic rhinitis 12/19/2019  . Pre-op evaluation 10/23/2019  . Murmur 12/10/2015  . Pes planus of both feet 01/07/2015  . Depression 01/07/2015  . Female cystocele 06/26/2014  . Healthcare maintenance 12/17/2012  . Osteoarthritis 06/21/2010  . Hyperlipidemia 05/31/2009  . DIVERTICULOSIS, COLON 10/16/2008  . AORTIC STENOSIS, MILD 06/02/2008  . Diabetes mellitus type 2, controlled, without complications ([place]) 12/01/3610  . Obesity (BMI 30-39.9) 12/06/2006  . SICKLE CELL TRAIT 12/06/2006  . HYPERTENSION, BENIGN SYSTEMIC 12/06/2006  . CORONARY, ARTERIOSCLEROSIS 12/06/2006    Dorene Ar, PTA 04/27/2020, 11:04 AM  [place], [place], [address] Phone: [phone]   Fax:  [phone]  Name: Shantell Belongia MRN: [id number] Date of Birth: 24-Jan-1951

## 2020-04-29 ENCOUNTER — Telehealth: Payer: Self-pay | Admitting: Orthopaedic Surgery

## 2020-04-29 MED ORDER — TRAMADOL HCL 50 MG PO TABS: 50.0000 mg | ORAL_TABLET | Freq: Every day | ORAL | 0 refills | Status: DC | PRN

## 2020-04-29 NOTE — Telephone Encounter (Signed)
Pls advise.  

## 2020-04-29 NOTE — Telephone Encounter (Signed)
Patient called needing Rx filled (Tramadol) Patient said she can not take the Oxycodone. Patient said she is in (PT) now. The number to contact patient is [phone]

## 2020-04-30 ENCOUNTER — Other Ambulatory Visit: Payer: Self-pay

## 2020-04-30 ENCOUNTER — Encounter: Payer: Self-pay | Admitting: Physical Therapy

## 2020-04-30 ENCOUNTER — Ambulatory Visit: Payer: Medicare HMO | Admitting: Physical Therapy

## 2020-04-30 DIAGNOSIS — M25661 Stiffness of right knee, not elsewhere classified: Secondary | ICD-10-CM

## 2020-04-30 DIAGNOSIS — M6281 Muscle weakness (generalized): Secondary | ICD-10-CM

## 2020-04-30 DIAGNOSIS — M25561 Pain in right knee: Secondary | ICD-10-CM | POA: Diagnosis not present

## 2020-04-30 DIAGNOSIS — R269 Unspecified abnormalities of gait and mobility: Secondary | ICD-10-CM

## 2020-04-30 DIAGNOSIS — G8929 Other chronic pain: Secondary | ICD-10-CM

## 2020-04-30 NOTE — Therapy (Signed)
[place], [place], [address] Phone: [phone]   Fax:  [phone]  Physical Therapy Treatment Progress Note Reporting Period 03/12/2020 to 04/30/2020  See note below for Objective Data and Assessment of Progress/Goals.       Patient Details  Name: Terri Estrada MRN: [id number] Date of Birth: [birth year] Referring Provider (PT):  Leandrew Koyanagi, MD    Encounter Date: 04/30/2020   PT End of Session - 04/30/20 0836    Visit Number 10    Number of Visits 17    Date for PT Re-Evaluation 05/07/20    Authorization Type MCR: Kx mod at 15th visit    Progress Note Due on Visit 20    PT Start Time 0831    PT Stop Time 0914    PT Time Calculation (min) 43 min           Past Medical History:  Diagnosis Date  . Arthritis   . CAD in native artery    a. MI 2001 s/p PTCA/stenting to mid-distal junction of RCA - residual dx treated medically.  . Depression    pt reports having it intermittently  . Diverticulosis of colon (without mention of hemorrhage)   . Essential hypertension, benign   . MI (myocardial infarction) ([place])   . Mild pulmonary hypertension ([place])   . Obesity, unspecified   . Onychia and paronychia of toe   . Osteoarthrosis, unspecified whether generalized or localized, unspecified site   . Other and unspecified hyperlipidemia   . Sickle-cell trait ([place])   . Sinus bradycardia    a. HR 40s even on low dose metoprolol - BB stopped with resolution.  . Streptococcal sore throat   . Type II or unspecified type diabetes mellitus without mention of complication, not stated as uncontrolled     Past Surgical History:  Procedure Laterality Date  . ANGIOPLASTY  09/21/00  . CARDIAC CATHETERIZATION    . TOTAL KNEE ARTHROPLASTY Right 02/23/2020   Procedure: RIGHT TOTAL KNEE ARTHROPLASTY;  Surgeon: Leandrew Koyanagi, MD;  Location: [place] [facility];  Service: Orthopedics;  Laterality: Right;    There were no  vitals filed for this visit.   Subjective Assessment - 04/30/20 0837    Subjective " I was pretty sore after the last session. today I took some tramadol which has helped so I have no pain right now."    Currently in Pain? Yes    Pain Score 0-No pain   took pain mediaciton prior to appointment   Pain Orientation Right              [facility] PT Assessment - 04/30/20 0001      Assessment   Medical Diagnosis Status post total right knee replacement Z96.651    Referring Provider (PT)  Leandrew Koyanagi, MD       AROM   Right Knee Extension 5    Right Knee Flexion 98   103 following manual     Strength   Right Knee Flexion 4/5    Right Knee Extension 4/5                         [facility] Adult PT Treatment/Exercise - 04/30/20 0001      Knee/Hip Exercises: Stretches   Quad Stretch Right;30 seconds;3 reps   PNF contract/ relax   Quad Stretch Limitations 2 x 30       Knee/Hip Exercises: Aerobic   Recumbent  Bike x 5 min full revolutions in a slow controlled fashion    Nustep x 5 minutes L5  LE only      Knee/Hip Exercises: Machines for Strengthening   Cybex Leg Press 20# 1x 15, 40# 1 x 15      Knee/Hip Exercises: Seated   Long Arc Quad 2 sets;20 reps    Long Arc Quad Weight 4 lbs.    Hamstring Curl 20 reps;2 sets;Right;Strengthening    Hamstring Limitations green       Manual Therapy   Manual Therapy Joint mobilization;Other (comment);Soft tissue mobilization    Joint Mobilization tibiofemoral AP grade IV mobs,    pt activley flexing knee to availble end range between sets   Soft tissue mobilization IASTM lateral/ anterior distal  quad     Other Manual Therapy MTPR along rectus femoris x 3    proximal to distal                   PT Short Term Goals - 04/30/20 0903      PT SHORT TERM GOAL #2   Title increase knee ROM to >/= 8 - 100 degrees for functional knee ROM progression with </= 5/10 pain    Period Weeks    Status Achieved             PT Long  Term Goals - 04/30/20 9357      PT LONG TERM GOAL #1   Title pt to increaes knee ROM to >/= 3 to 115 degrees for functional ROM required for functional and efficient gait pattern    Period Weeks    Status On-going      PT LONG TERM GOAL #2   Title pt to increase RLE gross strength to >/= 4+/5 to promote hip/ knee stability    Period Weeks    Status On-going      PT LONG TERM GOAL #3   Title pt tobe able to sit, stand and walk for >/= 45 min with SPC for functional endurance required for in home and community ambulation    Baseline walking 15 min with SPC    Period Weeks    Status On-going      PT LONG TERM GOAL #4   Title increase FOTO score to </= 28% limited to demo improvement in function    Period Weeks    Status Unable to assess      PT LONG TERM GOAL #5   Title pt to be I with all HEP given to maintain and progress current level of function    Period Weeks    Status On-going                 Plan - 04/30/20 0909    Clinical Impression Statement pt continues to make progress with physical therapy increaseing knee ROM 5 - 103 degrees following session. continued working on mobs to promote knee ROM and strength. She met all her STG's and is makeing progress toward her LTG's. She would benefit from continued physical therapy to decrease R knee pain, increase ROM, improve strength and functional mobility.    PT Treatment/Interventions ADLs/Self Care Home Management;Electrical Stimulation;Iontophoresis '4mg'$ /ml Dexamethasone;Cryotherapy;Moist Heat;Ultrasound;Therapeutic activities;Therapeutic exercise;Neuromuscular re-education;Balance training;Patient/family education;Manual techniques;Dry needling;Taping;Vasopneumatic Device;Passive range of motion;Scar mobilization    PT Next Visit Plan review/ update HEP PRN, capture FOTO staus at visit 12 ,  knee mobs and ROM,  gait training, Vaso for pain/ swelling, proprioception    PT Home Exercise Plan [id number] -  heel slide with strap,  seated heel slide, SLR, quad set, sidelying hip abduction, verbally added step ups on her step and SLS trials    Consulted and Agree with Plan of Care Patient           Patient will benefit from skilled therapeutic intervention in order to improve the following deficits and impairments:  Abnormal gait, Improper body mechanics, Increased muscle spasms, Decreased strength, Postural dysfunction, Decreased activity tolerance, Decreased endurance, Pain, Decreased range of motion  Visit Diagnosis: Chronic pain of right knee  Stiffness of right knee, not elsewhere classified  Abnormal gait  Muscle weakness (generalized)     Problem List Patient Active Problem List   Diagnosis Date Noted  . Status post total right knee replacement 02/23/2020  . Primary osteoarthritis of right knee 02/22/2020  . Acute seasonal allergic rhinitis 12/19/2019  . Pre-op evaluation 10/23/2019  . Murmur 12/10/2015  . Pes planus of both feet 01/07/2015  . Depression 01/07/2015  . Female cystocele 06/26/2014  . Healthcare maintenance 12/17/2012  . Osteoarthritis 06/21/2010  . Hyperlipidemia 05/31/2009  . DIVERTICULOSIS, COLON 10/16/2008  . AORTIC STENOSIS, MILD 06/02/2008  . Diabetes mellitus type 2, controlled, without complications ([place]) 72/90/2111  . Obesity (BMI 30-39.9) 12/06/2006  . SICKLE CELL TRAIT 12/06/2006  . HYPERTENSION, BENIGN SYSTEMIC 12/06/2006  . CORONARY, ARTERIOSCLEROSIS 12/06/2006   Starr Lake PT, DPT, LAT, ATC  04/30/20  9:15 AM      [facility] [address] [place], [place], [address] Phone: [phone]   Fax:  [phone]  Name: Norita Meigs MRN: [id number] Date of Birth: 1951/01/18

## 2020-05-04 ENCOUNTER — Other Ambulatory Visit: Payer: Self-pay

## 2020-05-04 ENCOUNTER — Ambulatory Visit: Payer: Medicare HMO | Admitting: Physical Therapy

## 2020-05-04 DIAGNOSIS — G8929 Other chronic pain: Secondary | ICD-10-CM

## 2020-05-04 DIAGNOSIS — M25661 Stiffness of right knee, not elsewhere classified: Secondary | ICD-10-CM

## 2020-05-04 DIAGNOSIS — R269 Unspecified abnormalities of gait and mobility: Secondary | ICD-10-CM

## 2020-05-04 DIAGNOSIS — M25561 Pain in right knee: Secondary | ICD-10-CM

## 2020-05-04 DIAGNOSIS — M6281 Muscle weakness (generalized): Secondary | ICD-10-CM

## 2020-05-04 NOTE — Therapy (Signed)
[facility] [address] [place], [place], [address] Phone: [phone]   Fax:  [phone]  Physical Therapy Treatment / Re-certification  Patient Details  Name: Terri Estrada MRN: [id number] Date of Birth: 04-12-1951 Referring Provider (PT):  Terri Kos, MD    Encounter Date: 05/04/2020   PT End of Session - 05/04/20 0757    Visit Number 11    Number of Visits 17    Date for PT Re-Evaluation 06/03/20    Authorization Type MCR: Kx mod at 15th visit    Progress Note Due on Visit 20    PT Start Time 0800    PT Stop Time 0848    PT Time Calculation (min) 48 min    Activity Tolerance Patient tolerated treatment well    Behavior During Therapy [facility] for tasks assessed/performed           Past Medical History:  Diagnosis Date  . Arthritis   . CAD in native artery    a. MI 2001 s/p PTCA/stenting to mid-distal junction of RCA - residual dx treated medically.  . Depression    pt reports having it intermittently  . Diverticulosis of colon (without mention of hemorrhage)   . Essential hypertension, benign   . MI (myocardial infarction) ([facility])   . Mild pulmonary hypertension ([facility])   . Obesity, unspecified   . Onychia and paronychia of toe   . Osteoarthrosis, unspecified whether generalized or localized, unspecified site   . Other and unspecified hyperlipidemia   . Sickle-cell trait ([facility])   . Sinus bradycardia    a. HR 40s even on low dose metoprolol - BB stopped with resolution.  . Streptococcal sore throat   . Type II or unspecified type diabetes mellitus without mention of complication, not stated as uncontrolled     Past Surgical History:  Procedure Laterality Date  . ANGIOPLASTY  09/21/00  . CARDIAC CATHETERIZATION    . TOTAL KNEE ARTHROPLASTY Right 02/23/2020   Procedure: RIGHT TOTAL KNEE ARTHROPLASTY;  Surgeon: Terri Kos, MD;  [facility];  Service: Orthopedics;  Laterality: Right;    There were no  vitals filed for this visit.   Subjective Assessment - 05/04/20 0800    Subjective "I am doing pretty good, no pain but I did take medicaiton prior to coming in to today's treatment."    Currently in Pain? No/denies    Aggravating Factors  N/A    Pain Relieving Factors exercise, massage.              [facility] PT Assessment - 05/04/20 0001      Assessment   Medical Diagnosis Status post total right knee replacement Z96.651    Referring Provider (PT)  Terri Kos, MD     Onset Date/Surgical Date 02/23/20    Next MD Visit 05/18/2020      AROM   Right Knee Extension 5    Right Knee Flexion 97      Strength   Right Hip ABduction 4-/5    Right Knee Flexion 4+/5    Right Knee Extension 4+/5                         [facility] Adult PT Treatment/Exercise - 05/04/20 0001      Knee/Hip Exercises: Stretches   Passive Hamstring Stretch 2 reps;30 seconds;Right    Quad Stretch Right;30 seconds;3 reps   with strap in supine     Knee/Hip  Exercises: Aerobic   Recumbent Bike L1 x 6 min    lowering seat every 2 min to promote knee flexion     Knee/Hip Exercises: Machines for Strengthening   Cybex Knee Extension 1 x 20 bil LE 10#, 1 x 20 con bil/ ecc RLE 10#    Cybex Knee Flexion 1 x 20 bil LE 15#, 1 x 20 con bil/ ecc RLE 15#    Cybex Leg Press --      Knee/Hip Exercises: Standing   Lateral Step Up 1 set;15 reps;Step Height: 8";Hand Hold: 1    Forward Step Up 15 reps;Hand Hold: 1;Step Height: 8"    Other Standing Knee Exercises stepping over 6 hurdles: double limb ea rung 2 x leading with RLE, then 2 x leading with LLE. 2 x single limb ea rung   cues for hip/ knee flexion, avoiding circumduction.                  PT Education - 05/04/20 0858    Education Details reviewed HEP and updated today to include marching walking. reivewed reassessment findings compared to inital evaluation. Work on progressing walking time to promote endurance.    Person(s) Educated Patient      Methods Explanation;Verbal cues    Comprehension Verbalized understanding;Verbal cues required            PT Short Term Goals - 05/04/20 0806      PT SHORT TERM GOAL #1   Title pt to be I with intial HEP    Status Achieved      PT SHORT TERM GOAL #2   Title increase knee ROM to >/= 8 - 100 degrees for functional knee ROM progression with </= 5/10 pain    Period Weeks    Status Achieved      PT SHORT TERM GOAL #3   Title pt to be able to transitoin from using the RW to [facility] safely to maximzie mobility.    Status Achieved             PT Long Term Goals - 05/04/20 0630      PT LONG TERM GOAL #1   Title pt to increase knee ROM to >/= 3 to 115 degrees for functional ROM required for functional and efficient gait pattern    Baseline today pt was 5 - 97 degrees    Period Weeks    Status On-going      PT LONG TERM GOAL #2   Title pt to increase RLE gross strength to >/= 4+/5 to promote hip/ knee stability    Baseline today knee strength 4+/5, hip abductor 4-/5 05/04/2020    Period Weeks    Status On-going      PT LONG TERM GOAL #3   Title pt tobe able to sit, stand and walk for >/= 45 min with SPC for functional endurance required for in home and community ambulation    Baseline walking 15 min on track but feels can walk longer and will starting increasing time    Period Weeks    Status On-going      PT LONG TERM GOAL #4   Title increase FOTO score to </= 28% limited to demo improvement in function    Status Unable to assess      PT LONG TERM GOAL #5   Title pt to be I with all HEP given to maintain and progress current level of function    Period Weeks    Status  On-going                 Plan - 05/04/20 0855    Clinical Impression Statement Terri Estrada continues to make great progress with physical therapy increasing knee ROM, hip strength and reports no pain today. She does continue to demonstrate limited ROM compared bil at 5 - 97 today but notes no  funcitonal limtations regarding her ROM. continued working on funcitonal hip/ knee strengthening and dynamic balacne with stepping using hurdles. plan to continue with current treatment but dropping to 1 x a week for 4 weeks and working on funcitonal strength/ ROM and maximize overall function and work to ind exercise.    PT Frequency 1x / week    PT Duration 4 weeks    PT Treatment/Interventions ADLs/Self Care Home Management;Electrical Stimulation;Iontophoresis 4mg /ml Dexamethasone;Cryotherapy;Moist Heat;Ultrasound;Therapeutic activities;Therapeutic exercise;Neuromuscular re-education;Balance training;Patient/family education;Manual techniques;Dry needling;Taping;Vasopneumatic Device;Passive range of motion;Scar mobilization    PT Next Visit Plan update HEP PRN, capture FOTO staus at visit 12 ,  knee mobs and ROM,  gait training, Vaso for pain/ swelling, proprioception    PT Home Exercise Plan [id number] - heel slide with strap, seated heel slide, SLR, quad set, sidelying hip abduction, verbally added step ups on her step and SLS trials    Consulted and Agree with Plan of Care Patient           Patient will benefit from skilled therapeutic intervention in order to improve the following deficits and impairments:  Abnormal gait, Improper body mechanics, Increased muscle spasms, Decreased strength, Postural dysfunction, Decreased activity tolerance, Decreased endurance, Pain, Decreased range of motion  Visit Diagnosis: Chronic pain of right knee  Stiffness of right knee, not elsewhere classified  Abnormal gait  Muscle weakness (generalized)     Problem List Patient Active Problem List   Diagnosis Date Noted  . Status post total right knee replacement 02/23/2020  . Primary osteoarthritis of right knee 02/22/2020  . Acute seasonal allergic rhinitis 12/19/2019  . Pre-op evaluation 10/23/2019  . Murmur 12/10/2015  . Pes planus of both feet 01/07/2015  . Depression 01/07/2015  . Female  cystocele 06/26/2014  . Healthcare maintenance 12/17/2012  . Osteoarthritis 06/21/2010  . Hyperlipidemia 05/31/2009  . DIVERTICULOSIS, COLON 10/16/2008  . AORTIC STENOSIS, MILD 06/02/2008  . Diabetes mellitus type 2, controlled, without complications ([facility]) 12/06/2006  . Obesity (BMI 30-39.9) 12/06/2006  . SICKLE CELL TRAIT 12/06/2006  . HYPERTENSION, BENIGN SYSTEMIC 12/06/2006  . CORONARY, ARTERIOSCLEROSIS 12/06/2006   12/08/2006 PT, DPT, LAT, ATC  05/04/20  9:01 AM      [facility] [address] [place], [place], [place] Phone: [phone]   Fax:  [phone]  Name: Terri Estrada MRN: [name] Date of Birth: 05-Nov-1950

## 2020-05-07 ENCOUNTER — Ambulatory Visit: Payer: Medicare HMO | Admitting: Physical Therapy

## 2020-05-10 ENCOUNTER — Encounter: Payer: Medicare HMO | Admitting: Physical Therapy

## 2020-05-13 ENCOUNTER — Encounter: Payer: Self-pay | Admitting: Physical Therapy

## 2020-05-13 ENCOUNTER — Other Ambulatory Visit: Payer: Self-pay

## 2020-05-13 ENCOUNTER — Ambulatory Visit: Payer: Medicare HMO | Attending: Orthopaedic Surgery | Admitting: Physical Therapy

## 2020-05-13 DIAGNOSIS — G8929 Other chronic pain: Secondary | ICD-10-CM | POA: Insufficient documentation

## 2020-05-13 DIAGNOSIS — M25661 Stiffness of right knee, not elsewhere classified: Secondary | ICD-10-CM

## 2020-05-13 DIAGNOSIS — M25561 Pain in right knee: Secondary | ICD-10-CM | POA: Diagnosis not present

## 2020-05-13 DIAGNOSIS — M6281 Muscle weakness (generalized): Secondary | ICD-10-CM | POA: Insufficient documentation

## 2020-05-13 DIAGNOSIS — R269 Unspecified abnormalities of gait and mobility: Secondary | ICD-10-CM

## 2020-05-13 NOTE — Therapy (Signed)
[facility] [address] [place], [place], [address] Phone: [phone]   Fax:  [phone]  Physical Therapy Treatment  Patient Details  Name: Terri Estrada MRN: [id number] Date of Birth: 06-Aug-1951 Referring Provider (PT):  Tarry Kos, MD    Encounter Date: 05/13/2020   PT End of Session - 05/13/20 0803    Visit Number 12    Number of Visits 17    Date for PT Re-Evaluation 06/03/20    Authorization Type MCR: Kx mod at 15th visit    Progress Note Due on Visit 20    PT Start Time 0800    PT Stop Time 0841    PT Time Calculation (min) 41 min           Past Medical History:  Diagnosis Date   Arthritis    CAD in native artery    a. MI 2001 s/p PTCA/stenting to mid-distal junction of RCA - residual dx treated medically.   Depression    pt reports having it intermittently   Diverticulosis of colon (without mention of hemorrhage)    Essential hypertension, benign    MI (myocardial infarction) ([facility])    Mild pulmonary hypertension ([facility])    Obesity, unspecified    Onychia and paronychia of toe    Osteoarthrosis, unspecified whether generalized or localized, unspecified site    Other and unspecified hyperlipidemia    Sickle-cell trait ([facility])    Sinus bradycardia    a. HR 40s even on low dose metoprolol - BB stopped with resolution.   Streptococcal sore throat    Type II or unspecified type diabetes mellitus without mention of complication, not stated as uncontrolled     Past Surgical History:  Procedure Laterality Date   ANGIOPLASTY  09/21/00   CARDIAC CATHETERIZATION     TOTAL KNEE ARTHROPLASTY Right 02/23/2020   Procedure: RIGHT TOTAL KNEE ARTHROPLASTY;  Surgeon: Tarry Kos, MD;  [facility];  Service: Orthopedics;  Laterality: Right;    There were no vitals filed for this visit.   Subjective Assessment - 05/13/20 0804    Subjective " I still get some stiffness but really it has been  getting better. I increased my walking to 25 min and it really has made a big difference."    Patient Stated Goals to get rid of the swelling, reduce pain/ stiffness    Currently in Pain? No/denies              [facility] PT Assessment - 05/13/20 0001      Assessment   Medical Diagnosis Status post total right knee replacement Z96.651    Referring Provider (PT)  Tarry Kos, MD       Observation/Other Assessments   Focus on Therapeutic Outcomes (FOTO)  29% limited      AROM   Right Knee Extension 5    Right Knee Flexion 101                         [facility] Adult PT Treatment/Exercise - 05/13/20 0001      Knee/Hip Exercises: Stretches   Passive Hamstring Stretch 2 reps;30 seconds;Right    Quad Stretch 30 seconds;Right;3 reps   PNF contract/ relax    Quad Stretch Limitations 2 x 30 sec with R foot in chair    Gastroc Stretch Both;2 reps;30 seconds   slant board     Knee/Hip Exercises: Aerobic   Recumbent Bike L1 x 6  min    lowering seat every 2 min      Knee/Hip Exercises: Machines for Strengthening   Cybex Knee Extension 1 x 20 bil LE 10#, 1 x 20 con bil/ ecc RLE 10#    Cybex Knee Flexion 1 x 20 bil LE 20#, 1 x 20 con bil/ ecc RLE 20#    Cybex Leg Press 40# 1 x 15 bil, 1 x 15 lowering sled to promote knee flexion      Knee/Hip Exercises: Standing   Forward Step Up 2 sets;15 reps;Step Height: 6"   with 1 HHA for 1 set, and no hands 2nd set     Manual Therapy   Joint Mobilization tibiofemoral AP grade IV mobs, inferior patellar mobs grade IV                    PT Short Term Goals - 05/04/20 0806      PT SHORT TERM GOAL #1   Title pt to be I with intial HEP    Status Achieved      PT SHORT TERM GOAL #2   Title increase knee ROM to >/= 8 - 100 degrees for functional knee ROM progression with </= 5/10 pain    Period Weeks    Status Achieved      PT SHORT TERM GOAL #3   Title pt to be able to transitoin from using the RW to [facility] safely to maximzie  mobility.    Status Achieved             PT Long Term Goals - 05/04/20 6767      PT LONG TERM GOAL #1   Title pt to increase knee ROM to >/= 3 to 115 degrees for functional ROM required for functional and efficient gait pattern    Baseline today pt was 5 - 97 degrees    Period Weeks    Status On-going      PT LONG TERM GOAL #2   Title pt to increase RLE gross strength to >/= 4+/5 to promote hip/ knee stability    Baseline today knee strength 4+/5, hip abductor 4-/5 05/04/2020    Period Weeks    Status On-going      PT LONG TERM GOAL #3   Title pt tobe able to sit, stand and walk for >/= 45 min with SPC for functional endurance required for in home and community ambulation    Baseline walking 15 min on track but feels can walk longer and will starting increasing time    Period Weeks    Status On-going      PT LONG TERM GOAL #4   Title increase FOTO score to </= 28% limited to demo improvement in function    Status Unable to assess      PT LONG TERM GOAL #5   Title pt to be I with all HEP given to maintain and progress current level of function    Period Weeks    Status On-going                 Plan - 05/13/20 2094    Clinical Impression Statement pt demonstrates improvement in knee flexion today at 101 and conitnues to have 5 degrees of extension. She performed all exercises well with increased reps and emphasis on knee flexio nto promote range. end of session no report of pain and she declined modalities.           Patient will benefit from  skilled therapeutic intervention in order to improve the following deficits and impairments:  Abnormal gait, Improper body mechanics, Increased muscle spasms, Decreased strength, Postural dysfunction, Decreased activity tolerance, Decreased endurance, Pain, Decreased range of motion  Visit Diagnosis: Chronic pain of right knee  Stiffness of right knee, not elsewhere classified  Abnormal gait  Muscle weakness  (generalized)     Problem List Patient Active Problem List   Diagnosis Date Noted   Status post total right knee replacement 02/23/2020   Primary osteoarthritis of right knee 02/22/2020   Acute seasonal allergic rhinitis 12/19/2019   Pre-op evaluation 10/23/2019   Murmur 12/10/2015   Pes planus of both feet 01/07/2015   Depression 01/07/2015   Female cystocele 06/26/2014   Healthcare maintenance 12/17/2012   Osteoarthritis 06/21/2010   Hyperlipidemia 05/31/2009   DIVERTICULOSIS, COLON 10/16/2008   AORTIC STENOSIS, MILD 06/02/2008   Diabetes mellitus type 2, controlled, without complications ([facility]) 12/06/2006   Obesity (BMI 30-39.9) 12/06/2006   SICKLE CELL TRAIT 12/06/2006   HYPERTENSION, BENIGN SYSTEMIC 12/06/2006   CORONARY, ARTERIOSCLEROSIS 12/06/2006   Lulu Riding PT, DPT, LAT, ATC  05/13/20  8:45 AM      [facility] [address] [place], [place], [address] Phone: [phone]   Fax:  [phone]  Name: Antwanette Wesche MRN: [id number] Date of Birth: December 21, 1950

## 2020-05-14 ENCOUNTER — Other Ambulatory Visit: Payer: Self-pay | Admitting: Orthopaedic Surgery

## 2020-05-17 ENCOUNTER — Encounter: Payer: Medicare HMO | Admitting: Physical Therapy

## 2020-05-18 ENCOUNTER — Ambulatory Visit (INDEPENDENT_AMBULATORY_CARE_PROVIDER_SITE_OTHER): Payer: Medicare HMO | Admitting: Orthopaedic Surgery

## 2020-05-18 VITALS — Ht 61.0 in | Wt 197.0 lb

## 2020-05-18 DIAGNOSIS — Z96651 Presence of right artificial knee joint: Secondary | ICD-10-CM

## 2020-05-18 NOTE — Progress Notes (Signed)
Patient ID: Terri Estrada, female   DOB: 1950/12/04, 69 y.o.   MRN: [id number]  Terri Estrada is 37-month status post right total knee replacement.  She is doing much better and walking without a cane most of the time.  Her left knee is really the source of her problems at this time.  She takes an occasional muscle relaxer and tramadol mainly with physical therapy.  She is almost done with physical therapy.  Her surgical scar is fully healed.  She has excellent range of motion.  Stable to varus valgus.  She is shooting for having the left knee replaced in November or December.  We will call her to set this up.  I would like to see her back in 3 months with two-view x-rays of the right knee.

## 2020-05-19 ENCOUNTER — Encounter: Payer: Self-pay | Admitting: Physical Therapy

## 2020-05-19 ENCOUNTER — Other Ambulatory Visit: Payer: Self-pay

## 2020-05-19 ENCOUNTER — Ambulatory Visit: Payer: Medicare HMO | Admitting: Physical Therapy

## 2020-05-19 DIAGNOSIS — R269 Unspecified abnormalities of gait and mobility: Secondary | ICD-10-CM

## 2020-05-19 DIAGNOSIS — M25661 Stiffness of right knee, not elsewhere classified: Secondary | ICD-10-CM | POA: Diagnosis not present

## 2020-05-19 DIAGNOSIS — G8929 Other chronic pain: Secondary | ICD-10-CM | POA: Diagnosis not present

## 2020-05-19 DIAGNOSIS — M25561 Pain in right knee: Secondary | ICD-10-CM | POA: Diagnosis not present

## 2020-05-19 DIAGNOSIS — M6281 Muscle weakness (generalized): Secondary | ICD-10-CM | POA: Diagnosis not present

## 2020-05-19 NOTE — Therapy (Signed)
[facility] Outpatient Rehabilitation [facility] [address] [place], [place], [address] Phone: [phone]   Fax:  [phone]  Physical Therapy Treatment  Patient Details  Name: Terri Estrada MRN: [id number] Date of Birth: [birth year] Referring Provider (PT):  Tarry Kos, MD    Encounter Date: 05/19/2020   PT End of Session - 05/19/20 0804    Visit Number 13    Number of Visits 17    Date for PT Re-Evaluation 06/03/20    Authorization Type MCR: Kx mod at 15th visit    Progress Note Due on Visit 20    PT Start Time 0803    PT Stop Time 0843    PT Time Calculation (min) 40 min    Activity Tolerance Patient tolerated treatment well           Past Medical History:  Diagnosis Date  . Arthritis   . CAD in native artery    a. MI 2001 s/p PTCA/stenting to mid-distal junction of RCA - residual dx treated medically.  . Depression    pt reports having it intermittently  . Diverticulosis of colon (without mention of hemorrhage)   . Essential hypertension, benign   . MI (myocardial infarction) (HCC)   . Mild pulmonary hypertension (HCC)   . Obesity, unspecified   . Onychia and paronychia of toe   . Osteoarthrosis, unspecified whether generalized or localized, unspecified site   . Other and unspecified hyperlipidemia   . Sickle-cell trait (HCC)   . Sinus bradycardia    a. HR 40s even on low dose metoprolol - BB stopped with resolution.  . Streptococcal sore throat   . Type II or unspecified type diabetes mellitus without mention of complication, not stated as uncontrolled     Past Surgical History:  Procedure Laterality Date  . ANGIOPLASTY  09/21/00  . CARDIAC CATHETERIZATION    . TOTAL KNEE ARTHROPLASTY Right 02/23/2020   Procedure: RIGHT TOTAL KNEE ARTHROPLASTY;  Surgeon: Tarry Kos, MD;  Location: [facility] OR;  Service: Orthopedics;  Laterality: Right;    There were no vitals filed for this visit.   Subjective Assessment - 05/19/20 0807     Subjective "I saw the MD and he was pleased with my progress. I don't have any pain today. My biggest challenge is going up/ down steps."    Currently in Pain? No/denies    Pain Onset More than a month ago    Pain Frequency Intermittent    Aggravating Factors  N/A    Pain Relieving Factors exercise, massage, medication              [facility] PT Assessment - 05/19/20 0001      Assessment   Medical Diagnosis Status post total right knee replacement Z96.651    Referring Provider (PT)  Tarry Kos, MD                          [facility] Adult PT Treatment/Exercise - 05/19/20 0001      Self-Care   Self-Care Other Self-Care Comments    Other Self-Care Comments  benefits of XFM along incsions to promote proper fiber alignment in regarding scar tissue      Knee/Hip Exercises: Stretches   Passive Hamstring Stretch 2 reps;30 seconds   with strap   Quad Stretch 2 reps;30 seconds;Right   with strap   Gastroc Stretch Both;2 reps;30 seconds   slant board     Knee/Hip Exercises: Aerobic  Nustep L6 x 6 min LE only      Knee/Hip Exercises: Machines for Strengthening   Cybex Knee Extension 1 x 20 con bil/ ecc RLE 15#    Cybex Knee Flexion 1 x 20 con bil/ ecc RLE 20#    Cybex Leg Press 2 x 15 con bil/ ecc RLE only 40#      Knee/Hip Exercises: Standing   Forward Step Up 2 sets;Step Height: 6";15 reps;Both   with contralateral limb hip flexon    Stairs up/ down 6 in steps x 10   cues on proper form/ rationale     Manual Therapy   Other Manual Therapy xFM along insicion                  PT Education - 05/19/20 0823    Education Details scar tissue massage to reduce potential hypertrophic scar tissue formation.    Person(s) Educated Patient    Methods Explanation;Verbal cues;Demonstration    Comprehension Verbalized understanding;Verbal cues required;Returned demonstration            PT Short Term Goals - 05/04/20 0806      PT SHORT TERM GOAL #1   Title pt to be  I with intial HEP    Status Achieved      PT SHORT TERM GOAL #2   Title increase knee ROM to >/= 8 - 100 degrees for functional knee ROM progression with </= 5/10 pain    Period Weeks    Status Achieved      PT SHORT TERM GOAL #3   Title pt to be able to transitoin from using the RW to [facility] safely to maximzie mobility.    Status Achieved             PT Long Term Goals - 05/04/20 9735      PT LONG TERM GOAL #1   Title pt to increase knee ROM to >/= 3 to 115 degrees for functional ROM required for functional and efficient gait pattern    Baseline today pt was 5 - 97 degrees    Period Weeks    Status On-going      PT LONG TERM GOAL #2   Title pt to increase RLE gross strength to >/= 4+/5 to promote hip/ knee stability    Baseline today knee strength 4+/5, hip abductor 4-/5 05/04/2020    Period Weeks    Status On-going      PT LONG TERM GOAL #3   Title pt tobe able to sit, stand and walk for >/= 45 min with SPC for functional endurance required for in home and community ambulation    Baseline walking 15 min on track but feels can walk longer and will starting increasing time    Period Weeks    Status On-going      PT LONG TERM GOAL #4   Title increase FOTO score to </= 28% limited to demo improvement in function    Status Unable to assess      PT LONG TERM GOAL #5   Title pt to be I with all HEP given to maintain and progress current level of function    Period Weeks    Status On-going                 Plan - 05/19/20 0829    Clinical Impression Statement pt reports seeing her MD and is pleased with her ROM and strength. continued working on hip/ knee strengtheing with focus  on fucntional strengthening with concentric bil, and eccentric using RLE. practiced stair training and strengthening to help facilitate the activity. end of session she noted no pain or issues.    PT Treatment/Interventions ADLs/Self Care Home Management;Electrical Stimulation;Iontophoresis  4mg /ml Dexamethasone;Cryotherapy;Moist Heat;Ultrasound;Therapeutic activities;Therapeutic exercise;Neuromuscular re-education;Balance training;Patient/family education;Manual techniques;Dry needling;Taping;Vasopneumatic Device;Passive range of motion;Scar mobilization    PT Next Visit Plan update HEP PRN, capture FOTO staus at visit 12 ,  knee mobs and ROM,  gait training, Vaso for pain/ swelling, proprioception    PT Home Exercise Plan [id number] - heel slide with strap, seated heel slide, SLR, quad set, sidelying hip abduction, verbally added step ups on her step and SLS trials    Consulted and Agree with Plan of Care Patient           Patient will benefit from skilled therapeutic intervention in order to improve the following deficits and impairments:  Abnormal gait, Improper body mechanics, Increased muscle spasms, Decreased strength, Postural dysfunction, Decreased activity tolerance, Decreased endurance, Pain, Decreased range of motion  Visit Diagnosis: Chronic pain of right knee  Stiffness of right knee, not elsewhere classified  Abnormal gait  Muscle weakness (generalized)     Problem List Patient Active Problem List   Diagnosis Date Noted  . Status post total right knee replacement 02/23/2020  . Primary osteoarthritis of right knee 02/22/2020  . Acute seasonal allergic rhinitis 12/19/2019  . Pre-op evaluation 10/23/2019  . Murmur 12/10/2015  . Pes planus of both feet 01/07/2015  . Depression 01/07/2015  . Female cystocele 06/26/2014  . Healthcare maintenance 12/17/2012  . Osteoarthritis 06/21/2010  . Hyperlipidemia 05/31/2009  . DIVERTICULOSIS, COLON 10/16/2008  . AORTIC STENOSIS, MILD 06/02/2008  . Diabetes mellitus type 2, controlled, without complications (HCC) 12/06/2006  . Obesity (BMI 30-39.9) 12/06/2006  . SICKLE CELL TRAIT 12/06/2006  . HYPERTENSION, BENIGN SYSTEMIC 12/06/2006  . CORONARY, ARTERIOSCLEROSIS 12/06/2006   12/08/2006 PT, DPT, LAT, ATC    05/19/20  8:43 AM      [facility] Outpatient Rehabilitation [facility] [address] [place], [place], [place] Phone: [phone]   Fax:  [phone]  Name: Terri Estrada MRN: [name] Date of Birth: [birth year]

## 2020-05-25 ENCOUNTER — Encounter: Payer: Self-pay | Admitting: Physical Therapy

## 2020-05-25 ENCOUNTER — Other Ambulatory Visit: Payer: Self-pay

## 2020-05-25 ENCOUNTER — Ambulatory Visit: Payer: Medicare HMO | Admitting: Physical Therapy

## 2020-05-25 DIAGNOSIS — R269 Unspecified abnormalities of gait and mobility: Secondary | ICD-10-CM

## 2020-05-25 DIAGNOSIS — G8929 Other chronic pain: Secondary | ICD-10-CM | POA: Diagnosis not present

## 2020-05-25 DIAGNOSIS — M25661 Stiffness of right knee, not elsewhere classified: Secondary | ICD-10-CM | POA: Diagnosis not present

## 2020-05-25 DIAGNOSIS — M25561 Pain in right knee: Secondary | ICD-10-CM

## 2020-05-25 DIAGNOSIS — M6281 Muscle weakness (generalized): Secondary | ICD-10-CM

## 2020-05-25 NOTE — Therapy (Signed)
[facility] Outpatient Rehabilitation [facility] [address] [place], [place], [address] Phone: [phone]   Fax:  [phone]  Physical Therapy Treatment  Patient Details  Name: Terri Estrada MRN: [id number] Date of Birth: Jan 13, 1951 Referring Provider (PT):  Tarry Kos, MD    Encounter Date: 05/25/2020   PT End of Session - 05/25/20 0802    Visit Number 14    Number of Visits 17    Date for PT Re-Evaluation 06/03/20    Authorization Type MCR: Kx mod at 15th visit    PT Start Time 0801    PT Stop Time 0843    PT Time Calculation (min) 42 min    Activity Tolerance Patient tolerated treatment well    Behavior During Therapy [facility] for tasks assessed/performed           Past Medical History:  Diagnosis Date  . Arthritis   . CAD in native artery    a. MI 2001 s/p PTCA/stenting to mid-distal junction of RCA - residual dx treated medically.  . Depression    pt reports having it intermittently  . Diverticulosis of colon (without mention of hemorrhage)   . Essential hypertension, benign   . MI (myocardial infarction) (HCC)   . Mild pulmonary hypertension (HCC)   . Obesity, unspecified   . Onychia and paronychia of toe   . Osteoarthrosis, unspecified whether generalized or localized, unspecified site   . Other and unspecified hyperlipidemia   . Sickle-cell trait (HCC)   . Sinus bradycardia    a. HR 40s even on low dose metoprolol - BB stopped with resolution.  . Streptococcal sore throat   . Type II or unspecified type diabetes mellitus without mention of complication, not stated as uncontrolled     Past Surgical History:  Procedure Laterality Date  . ANGIOPLASTY  09/21/00  . CARDIAC CATHETERIZATION    . TOTAL KNEE ARTHROPLASTY Right 02/23/2020   Procedure: RIGHT TOTAL KNEE ARTHROPLASTY;  Surgeon: Tarry Kos, MD;  Location: [facility] OR;  Service: Orthopedics;  Laterality: Right;    There were no vitals filed for this visit.   Subjective  Assessment - 05/25/20 0803    Subjective "since the last session the stairs have been alot better. I really don't have any issues today."    Patient Stated Goals to get rid of the swelling, reduce pain/ stiffness    Currently in Pain? No/denies    Aggravating Factors  N/A    Pain Relieving Factors exercise, massage, medication              [facility] PT Assessment - 05/25/20 0001      Assessment   Medical Diagnosis Status post total right knee replacement Z96.651    Referring Provider (PT)  Tarry Kos, MD                          [facility] Adult PT Treatment/Exercise - 05/25/20 0001      Knee/Hip Exercises: Stretches   Passive Hamstring Stretch 2 reps;30 seconds   with strap   Quad Stretch 2 reps;30 seconds;Right   with strap    Gastroc Stretch 2 reps;30 seconds   slant board     Knee/Hip Exercises: Aerobic   Elliptical L1 x 4 min ramp L1    Recumbent Bike L3 x 4 min       Knee/Hip Exercises: Machines for Strengthening   Cybex Knee Extension 1 x 20 con bil/ ecc RLE  15#      Knee/Hip Exercises: Standing   Hip Flexion Stengthening;Both;1 set;15 reps;Knee straight   with red banda round ankles maintaining DF    Hip Abduction 2 sets;10 reps;Knee straight   with red theraband around ankles   Hip Extension 2 sets;15 reps;Knee straight   with red theraband   Wall Squat 1 set;10 reps               Balance Exercises - 05/25/20 0001      Balance Exercises: Standing   Standing Eyes Opened Narrow base of support (BOS)   with bil UE ABC holding black physioball   SLS Eyes open;3 reps;20 secs   RLE only   Partial Tandem Stance Eyes open   with bil UE ABC holding black physioball            PT Education - 05/25/20 0829    Education Details reviewed HEP and updated today for standing hip strengthening and balance training    Person(s) Educated Patient    Methods Explanation;Verbal cues;Handout    Comprehension Verbalized understanding;Verbal cues required             PT Short Term Goals - 05/04/20 0806      PT SHORT TERM GOAL #1   Title pt to be I with intial HEP    Status Achieved      PT SHORT TERM GOAL #2   Title increase knee ROM to >/= 8 - 100 degrees for functional knee ROM progression with </= 5/10 pain    Period Weeks    Status Achieved      PT SHORT TERM GOAL #3   Title pt to be able to transitoin from using the RW to [facility] safely to maximzie mobility.    Status Achieved             PT Long Term Goals - 05/04/20 4315      PT LONG TERM GOAL #1   Title pt to increase knee ROM to >/= 3 to 115 degrees for functional ROM required for functional and efficient gait pattern    Baseline today pt was 5 - 97 degrees    Period Weeks    Status On-going      PT LONG TERM GOAL #2   Title pt to increase RLE gross strength to >/= 4+/5 to promote hip/ knee stability    Baseline today knee strength 4+/5, hip abductor 4-/5 05/04/2020    Period Weeks    Status On-going      PT LONG TERM GOAL #3   Title pt tobe able to sit, stand and walk for >/= 45 min with SPC for functional endurance required for in home and community ambulation    Baseline walking 15 min on track but feels can walk longer and will starting increasing time    Period Weeks    Status On-going      PT LONG TERM GOAL #4   Title increase FOTO score to </= 28% limited to demo improvement in function    Status Unable to assess      PT LONG TERM GOAL #5   Title pt to be I with all HEP given to maintain and progress current level of function    Period Weeks    Status On-going                 Plan - 05/25/20 0829    Clinical Impression Statement Terri Estrada continues to progress nicely with  physical therpay increasing hip/ knee strength, continued focus on functional strengthening with increased reps/ sets. she did well with all exercise today. plan next session to reassess next session and potentially discharge next session.    PT Treatment/Interventions ADLs/Self  Care Home Management;Electrical Stimulation;Iontophoresis 4mg /ml Dexamethasone;Cryotherapy;Moist Heat;Ultrasound;Therapeutic activities;Therapeutic exercise;Neuromuscular re-education;Balance training;Patient/family education;Manual techniques;Dry needling;Taping;Vasopneumatic Device;Passive range of motion;Scar mobilization    PT Next Visit Plan FOTO, ROM, Goals, Review/ update HEP potentially d/C    PT Home Exercise Plan [id number] - heel slide with strap, seated heel slide, SLR, quad set, sidelying hip abduction, verbally added step ups on her step and SLS trials, standing hip abduction/ extension, standing marching, SLS           Patient will benefit from skilled therapeutic intervention in order to improve the following deficits and impairments:  Abnormal gait, Improper body mechanics, Increased muscle spasms, Decreased strength, Postural dysfunction, Decreased activity tolerance, Decreased endurance, Pain, Decreased range of motion  Visit Diagnosis: Chronic pain of right knee  Stiffness of right knee, not elsewhere classified  Abnormal gait  Muscle weakness (generalized)     Problem List Patient Active Problem List   Diagnosis Date Noted  . Status post total right knee replacement 02/23/2020  . Primary osteoarthritis of right knee 02/22/2020  . Acute seasonal allergic rhinitis 12/19/2019  . Pre-op evaluation 10/23/2019  . Murmur 12/10/2015  . Pes planus of both feet 01/07/2015  . Depression 01/07/2015  . Female cystocele 06/26/2014  . Healthcare maintenance 12/17/2012  . Osteoarthritis 06/21/2010  . Hyperlipidemia 05/31/2009  . DIVERTICULOSIS, COLON 10/16/2008  . AORTIC STENOSIS, MILD 06/02/2008  . Diabetes mellitus type 2, controlled, without complications (HCC) 12/06/2006  . Obesity (BMI 30-39.9) 12/06/2006  . SICKLE CELL TRAIT 12/06/2006  . HYPERTENSION, BENIGN SYSTEMIC 12/06/2006  . CORONARY, ARTERIOSCLEROSIS 12/06/2006   12/08/2006 PT, DPT, LAT, ATC    05/25/20  8:44 AM      [facility] Outpatient Rehabilitation [facility] [address] [place], [place], [place] Phone: [phone]   Fax:  [phone]  Name: Terri Estrada MRN: [name] Date of Birth: Aug 17, 1951

## 2020-05-28 ENCOUNTER — Other Ambulatory Visit: Payer: Self-pay | Admitting: Physician Assistant

## 2020-05-28 ENCOUNTER — Telehealth: Payer: Self-pay | Admitting: Orthopaedic Surgery

## 2020-05-28 ENCOUNTER — Other Ambulatory Visit: Payer: Self-pay | Admitting: Orthopaedic Surgery

## 2020-05-28 MED ORDER — TRAMADOL HCL 50 MG PO TABS: 50.0000 mg | ORAL_TABLET | Freq: Every day | ORAL | 0 refills | Status: DC | PRN

## 2020-05-28 NOTE — Telephone Encounter (Signed)
Patient aware about Rx.   Has a few questions  1. Would like to know when she can drive. 2. Continue Aspirin 81 mg once or twice a day.

## 2020-05-28 NOTE — Telephone Encounter (Signed)
02/23/20 s/p right total knee do you wish to refill?

## 2020-05-28 NOTE — Telephone Encounter (Signed)
I sent in

## 2020-05-28 NOTE — Telephone Encounter (Signed)
Patient aware.

## 2020-05-28 NOTE — Telephone Encounter (Signed)
Can you Rf Tramadol? If so, please send into pharm.

## 2020-05-28 NOTE — Telephone Encounter (Signed)
t called stating she had forgotten to ask Dr.Xu some questions but he has remembered and would like a CB from him. Pt would also like to have refill of tramadol sent in to her [facility] pharmacy  [phone]

## 2020-05-28 NOTE — Telephone Encounter (Signed)
Ok to drive if not on pain meds.  Aspirin only for 6 weeks post-op

## 2020-06-03 ENCOUNTER — Encounter: Payer: Self-pay | Admitting: Physical Therapy

## 2020-06-03 ENCOUNTER — Other Ambulatory Visit: Payer: Self-pay

## 2020-06-03 ENCOUNTER — Ambulatory Visit: Payer: Medicare HMO | Admitting: Physical Therapy

## 2020-06-03 DIAGNOSIS — G8929 Other chronic pain: Secondary | ICD-10-CM | POA: Diagnosis not present

## 2020-06-03 DIAGNOSIS — R269 Unspecified abnormalities of gait and mobility: Secondary | ICD-10-CM | POA: Diagnosis not present

## 2020-06-03 DIAGNOSIS — M25561 Pain in right knee: Secondary | ICD-10-CM | POA: Diagnosis not present

## 2020-06-03 DIAGNOSIS — M25661 Stiffness of right knee, not elsewhere classified: Secondary | ICD-10-CM

## 2020-06-03 DIAGNOSIS — M6281 Muscle weakness (generalized): Secondary | ICD-10-CM | POA: Diagnosis not present

## 2020-06-03 NOTE — Therapy (Signed)
[place], [place], [address] Phone: [phone]   Fax:  [phone]  Physical Therapy Treatment / discharge  Patient Details  Name: Terri Estrada MRN: [id number] Date of Birth: 1950/11/23 Referring Provider (PT):  Leandrew Koyanagi, MD    Encounter Date: 06/03/2020   PT End of Session - 06/03/20 0755    Visit Number 15    Number of Visits 17    Date for PT Re-Evaluation 06/03/20    Authorization Type MCR: Kx mod at 15th visit    Progress Note Due on Visit 28    PT Start Time 0757    PT Stop Time 0835    PT Time Calculation (min) 38 min    Activity Tolerance Patient tolerated treatment well    Behavior During Therapy [facility] for tasks assessed/performed           Past Medical History:  Diagnosis Date   Arthritis    CAD in native artery    a. MI 2001 s/p PTCA/stenting to mid-distal junction of RCA - residual dx treated medically.   Depression    pt reports having it intermittently   Diverticulosis of colon (without mention of hemorrhage)    Essential hypertension, benign    MI (myocardial infarction) ([place])    Mild pulmonary hypertension (HCC)    Obesity, unspecified    Onychia and paronychia of toe    Osteoarthrosis, unspecified whether generalized or localized, unspecified site    Other and unspecified hyperlipidemia    Sickle-cell trait (HCC)    Sinus bradycardia    a. HR 40s even on low dose metoprolol - BB stopped with resolution.   Streptococcal sore throat    Type II or unspecified type diabetes mellitus without mention of complication, not stated as uncontrolled     Past Surgical History:  Procedure Laterality Date   ANGIOPLASTY  09/21/00   CARDIAC CATHETERIZATION     TOTAL KNEE ARTHROPLASTY Right 02/23/2020   Procedure: RIGHT TOTAL KNEE ARTHROPLASTY;  Surgeon: Leandrew Koyanagi, MD;  Location: [place];  Service: Orthopedics;  Laterality: Right;    There were no vitals  filed for this visit.   Subjective Assessment - 06/03/20 0817    Subjective "some soreness after the last session about 2 days after which did go away after a few days. Today I am doing pretty good with no pain, I do still get some occasional stiffness."    Patient Stated Goals to get rid of the swelling, reduce pain/ stiffness    Currently in Pain? No/denies    Aggravating Factors  N/A    Pain Relieving Factors exercise, stretching massage              [facility] PT Assessment - 06/03/20 0756      Assessment   Medical Diagnosis Status post total right knee replacement Z96.651    Referring Provider (PT)  Leandrew Koyanagi, MD     Onset Date/Surgical Date 02/23/20      Observation/Other Assessments   Focus on Therapeutic Outcomes (FOTO)  31% limited      AROM   Right Knee Extension 3    Right Knee Flexion 105      Strength   Right Hip ABduction 4/5    Right Knee Flexion 4+/5    Right Knee Extension 5/5                         [facility]  Adult PT Treatment/Exercise - 06/03/20 0001      Knee/Hip Exercises: Stretches   Quad Stretch 30 seconds;Right;3 reps   with foot in chair   Gastroc Stretch 2 reps;30 seconds      Knee/Hip Exercises: Aerobic   Recumbent Bike L3 x 6 min      Knee/Hip Exercises: Standing   Wall Squat 2 sets;10 reps                  PT Education - 06/03/20 6945    Education Details REviewed and updated HEP today. discussed appropriate progression of strenrgtheinng incresaing reps/ sets and resistance gradually to improve endurnace. reviewed Final FOTO assessment.    Person(s) Educated Patient    Methods Explanation;Verbal cues    Comprehension Verbalized understanding            PT Short Term Goals - 05/04/20 0806      PT SHORT TERM GOAL #1   Title pt to be I with intial HEP    Status Achieved      PT SHORT TERM GOAL #2   Title increase knee ROM to >/= 8 - 100 degrees for functional knee ROM progression with </= 5/10 pain     Period Weeks    Status Achieved      PT SHORT TERM GOAL #3   Title pt to be able to transitoin from using the RW to [facility] safely to maximzie mobility.    Status Achieved             PT Long Term Goals - 06/03/20 0810      PT LONG TERM GOAL #1   Title pt to increase knee ROM to >/= 3 to 115 degrees for functional ROM required for functional and efficient gait pattern    Baseline today 3 - 105    Period Weeks    Status Partially Met      PT LONG TERM GOAL #2   Title pt to increase RLE gross strength to >/= 4+/5 to promote hip/ knee stability    Baseline hip abductors 4/5    Status Partially Met      PT LONG TERM GOAL #3   Title pt tobe able to sit, stand and walk for >/= 45 min with SPC for functional endurance required for in home and community ambulation    Period Weeks    Status Achieved      PT LONG TERM GOAL #4   Title increase FOTO score to </= 28% limited to demo improvement in function    Period Weeks    Status Partially Met      PT LONG TERM GOAL #5   Title pt to be I with all HEP given to maintain and progress current level of function    Period Weeks    Status Achieved                 Plan - 06/03/20 0800    Clinical Impression Statement Terri Estrada has made great progress with physical therapy increasing knee ROM, RLE strength and additionally reports no pain. She does continue to demo mild limitation with flexion measuring 105 today but reports no functional limtations. She was able to complete all exercises today with no report of pain or limitations. She met or partially met all goals today and is able to maintain and progress her current LOF ind and will be discharged from PT today.    PT Treatment/Interventions ADLs/Self Care Home Management;Electrical Stimulation;Iontophoresis 11m/ml  Dexamethasone;Cryotherapy;Moist Heat;Ultrasound;Therapeutic activities;Therapeutic exercise;Neuromuscular re-education;Balance training;Patient/family education;Manual  techniques;Dry needling;Taping;Vasopneumatic Device;Passive range of motion;Scar mobilization    PT Next Visit Plan D/C    PT Home Exercise Plan [id number] - heel slide with strap, seated heel slide, SLR, quad set, sidelying hip abduction, verbally added step ups on her step and SLS trials, standing hip abduction/ extension, standing marching, SLS, wall squat, foot in chair quad stretch    Consulted and Agree with Plan of Care Patient           Patient will benefit from skilled therapeutic intervention in order to improve the following deficits and impairments:  Abnormal gait, Improper body mechanics, Increased muscle spasms, Decreased strength, Postural dysfunction, Decreased activity tolerance, Decreased endurance, Pain, Decreased range of motion  Visit Diagnosis: Chronic pain of right knee  Stiffness of right knee, not elsewhere classified  Abnormal gait  Muscle weakness (generalized)     Problem List Patient Active Problem List   Diagnosis Date Noted   Status post total right knee replacement 02/23/2020   Primary osteoarthritis of right knee 02/22/2020   Acute seasonal allergic rhinitis 12/19/2019   Pre-op evaluation 10/23/2019   Murmur 12/10/2015   Pes planus of both feet 01/07/2015   Depression 01/07/2015   Female cystocele 06/26/2014   Healthcare maintenance 12/17/2012   Osteoarthritis 06/21/2010   Hyperlipidemia 05/31/2009   DIVERTICULOSIS, COLON 10/16/2008   AORTIC STENOSIS, MILD 06/02/2008   Diabetes mellitus type 2, controlled, without complications ([place]) 36/92/2300   Obesity (BMI 30-39.9) 12/06/2006   SICKLE CELL TRAIT 12/06/2006   HYPERTENSION, BENIGN SYSTEMIC 12/06/2006   CORONARY, ARTERIOSCLEROSIS 12/06/2006    Starr Lake PT, DPT, LAT, ATC  06/03/20  8:34 AM      [facility] [address] [place], [place], [address] Phone: [phone]   Fax:  [phone]  Name: Terri Estrada MRN: [id number] Date of Birth: [birth year]    PHYSICAL THERAPY DISCHARGE SUMMARY  Visits from Start of Care: 15  Current functional level related to goals / functional outcomes: See goals, FOTO 31% limited   Remaining deficits: See assessment   Education / Equipment: HEP, theraband, posture, gait / stair training  Plan: Patient agrees to discharge.  Patient goals were not met. Patient is being discharged due to being pleased with the current functional level.  ?????         Tirza Senteno PT, DPT, LAT, ATC  06/03/20  8:35 AM

## 2020-06-09 DIAGNOSIS — E119 Type 2 diabetes mellitus without complications: Secondary | ICD-10-CM | POA: Diagnosis not present

## 2020-06-09 DIAGNOSIS — H2513 Age-related nuclear cataract, bilateral: Secondary | ICD-10-CM | POA: Diagnosis not present

## 2020-06-09 DIAGNOSIS — H52203 Unspecified astigmatism, bilateral: Secondary | ICD-10-CM | POA: Diagnosis not present

## 2020-06-09 LAB — HM DIABETES EYE EXAM

## 2020-06-18 ENCOUNTER — Ambulatory Visit (INDEPENDENT_AMBULATORY_CARE_PROVIDER_SITE_OTHER): Payer: Medicare HMO | Admitting: Family Medicine

## 2020-06-18 ENCOUNTER — Encounter: Payer: Self-pay | Admitting: Family Medicine

## 2020-06-18 ENCOUNTER — Other Ambulatory Visit: Payer: Self-pay

## 2020-06-18 VITALS — BP 128/86 | HR 86 | Ht 61.0 in | Wt 198.4 lb

## 2020-06-18 DIAGNOSIS — E119 Type 2 diabetes mellitus without complications: Secondary | ICD-10-CM | POA: Diagnosis not present

## 2020-06-18 DIAGNOSIS — R5383 Other fatigue: Secondary | ICD-10-CM | POA: Diagnosis not present

## 2020-06-18 DIAGNOSIS — Z23 Encounter for immunization: Secondary | ICD-10-CM | POA: Diagnosis not present

## 2020-06-18 DIAGNOSIS — Z0000 Encounter for general adult medical examination without abnormal findings: Secondary | ICD-10-CM | POA: Diagnosis not present

## 2020-06-18 LAB — POCT GLYCOSYLATED HEMOGLOBIN (HGB A1C): HbA1c, POC (controlled diabetic range): 6.1 % (ref 0.0–7.0)

## 2020-06-18 NOTE — Assessment & Plan Note (Signed)
Patient reports feeling fatigue since July. She reports that she took some vitamin B12 tablets from her sister and felt better for a few days. Patient denies SOB or palpitations. Denies any signs of blood loss.  - check B12 levels today  - CBC  - TSH

## 2020-06-18 NOTE — Patient Instructions (Addendum)
It was a pleasure to see you today!  Thank you for choosing [facility] for your primary care.  Joanell Cressler was seen for diabetes follow up.   Our plans for today were:  Your hemoglobin A1c was still within goal range at 6.1. Great job!   We will be checking your blood count, thyroid and Vitamin B12 levels today.   You received your pneumonia vaccine today.   To keep you healthy, please keep in mind the following health maintenance items that you are due for:   1. Flu vaccine   You should return to our clinic in 3 months for diabetes follow up.   Best Wishes,   Dr. Neita Garnet

## 2020-06-18 NOTE — Assessment & Plan Note (Signed)
PNA vaccine given today  Patient would like to wait for influenza vaccine

## 2020-06-18 NOTE — Progress Notes (Signed)
    SUBJECTIVE:   CHIEF COMPLAINT / HPI: DM f/u   Patient presents to follow up hemoglobin A1c for T2DM. Last hemoglobin A1c was well below goal range at 6.1. Patient continues to monitor her diet and exercises regularly by walking with her sister. She reports that she is able to walk more within her previous goals since having her knee surgery. She plans to have her left knee replaced later this year. She has completed PT for her knee and is happy with her progression.   Insominia & Fatigue  Patient reports she has been feeling more tired since July 2021. She reports that she often feels that she needs a nap throughout the day which is abnormal for her. She also states she has problems with staying asleep past four hours throughout the night. Patient denies any trouble breathing while sleeping. She would like to know what her vitamin B12 levels are as she has been researching how surgery can effect your B12 levels and she felt less tired after taking her sister's B12 supplements.   HM  Patient offered influenza and PNA vaccines. Patient would like to receive PNA vaccine. She reports she has completed both covid vaccines. She would like to wait until further in the fall for her flu vaccine.    PERTINENT  PMH / PSH:  T2DM, diet controlled  Right knee replacement    OBJECTIVE:   BP 128/86   Pulse 86   Ht 5\' 1"  (1.549 m)   Wt 198 lb 6.4 oz (90 kg)   SpO2 96%   BMI 37.49 kg/m   General: female appearing stated age in no acute distress Cardio: Normal S1 and S2, no S3 or S4. Rhythm is regular. Bilateral radial pulses palpable Pulm: Clear to auscultation bilaterally, no crackles, wheezing, or diminished breath sounds. Normal respiratory effort Abdomen: Bowel sounds normal. Abdomen soft and non-tender.  Extremities: bilateral edema, non pitting; surgical scar along patella or RLE    ASSESSMENT/PLAN:   Diabetes mellitus type 2, controlled, without complications Last hemoglobin A1c 6.1,  same today; Patient technically below threshold for diagnosis of diabetes with lifestyle modification.  - patient to continue with diet control for glucose management   Healthcare maintenance PNA vaccine given today  Patient would like to wait for influenza vaccine   Fatigue Patient reports feeling fatigue since July. She reports that she took some vitamin B12 tablets from her sister and felt better for a few days. Patient denies SOB or palpitations. Denies any signs of blood loss.  - check B12 levels today  - CBC  - TSH    Follow up in 3 months   August, MD [facility]

## 2020-06-18 NOTE — Assessment & Plan Note (Addendum)
Last hemoglobin A1c 6.1, same today; Patient technically below threshold for diagnosis of diabetes with lifestyle modification.  - patient to continue with diet control for glucose management

## 2020-06-19 LAB — CBC WITH DIFFERENTIAL/PLATELET
Basophils Absolute: 0.1 10*3/uL (ref 0.0–0.2)
Basos: 1 %
EOS (ABSOLUTE): 0.2 10*3/uL (ref 0.0–0.4)
Eos: 4 %
Hematocrit: 38.6 % (ref 34.0–46.6)
Hemoglobin: 13 g/dL (ref 11.1–15.9)
Immature Grans (Abs): 0 10*3/uL (ref 0.0–0.1)
Immature Granulocytes: 0 %
Lymphocytes Absolute: 2.2 10*3/uL (ref 0.7–3.1)
Lymphs: 38 %
MCH: 30.8 pg (ref 26.6–33.0)
MCHC: 33.7 g/dL (ref 31.5–35.7)
MCV: 92 fL (ref 79–97)
Monocytes Absolute: 0.8 10*3/uL (ref 0.1–0.9)
Monocytes: 14 %
Neutrophils Absolute: 2.5 10*3/uL (ref 1.4–7.0)
Neutrophils: 43 %
Platelets: 312 10*3/uL (ref 150–450)
RBC: 4.22 x10E6/uL (ref 3.77–5.28)
RDW: 13.7 % (ref 11.7–15.4)
WBC: 5.8 10*3/uL (ref 3.4–10.8)

## 2020-06-19 LAB — TSH: TSH: 1.17 u[IU]/mL (ref 0.450–4.500)

## 2020-06-19 LAB — VITAMIN B12: Vitamin B-12: 1424 pg/mL — ABNORMAL HIGH (ref 232–1245)

## 2020-06-20 ENCOUNTER — Other Ambulatory Visit: Payer: Self-pay | Admitting: Physician Assistant

## 2020-06-20 ENCOUNTER — Other Ambulatory Visit: Payer: Self-pay | Admitting: Cardiology

## 2020-06-20 DIAGNOSIS — E785 Hyperlipidemia, unspecified: Secondary | ICD-10-CM

## 2020-06-20 DIAGNOSIS — I10 Essential (primary) hypertension: Secondary | ICD-10-CM

## 2020-06-21 DIAGNOSIS — N816 Rectocele: Secondary | ICD-10-CM | POA: Diagnosis not present

## 2020-06-21 DIAGNOSIS — N8111 Cystocele, midline: Secondary | ICD-10-CM | POA: Diagnosis not present

## 2020-06-21 DIAGNOSIS — Z4689 Encounter for fitting and adjustment of other specified devices: Secondary | ICD-10-CM | POA: Diagnosis not present

## 2020-06-23 DIAGNOSIS — H2000 Unspecified acute and subacute iridocyclitis: Secondary | ICD-10-CM | POA: Diagnosis not present

## 2020-07-05 NOTE — Progress Notes (Signed)
Cardiology Office Note    Date:  07/07/2020   ID:  Terri Estrada, DOB [birth year], MRN [id number]  PCP:  Ronnald RampSimmons-Robinson, [place], MD  Cardiologist: Tobias AlexanderKatarina Nelson, MD EPS: None  Chief Complaint  Patient presents with  . Follow-up    History of Present Illness:  Terri Estrada is a 69 y.o. female with history of CAD (MI 2001 s/p PTCA/stenting to mid-distal junction of RCA), mild pulm HTN by echo 01/2016, essential HTN, HLD, sickle cell trait, DM, obesity,   Patient last saw Ronie Spiesayna Dunn, PA-C 07/03/2019 at which time she was doing well.  She updated the patient's echo 07/07/2019 normal LVEF 60 to 65% with diastolic dysfunction.  Patient comes in for yearly f/u. Had a knee replacement in May. Walking 1 mile/day. Denies chest pain, shortness of breath, palpitations, edema, dizziness or presyncope.   Past Medical History:  Diagnosis Date  . Arthritis   . CAD in native artery    a. MI 2001 s/p PTCA/stenting to mid-distal junction of RCA - residual dx treated medically.  . Depression    pt reports having it intermittently  . Diverticulosis of colon (without mention of hemorrhage)   . Essential hypertension, benign   . MI (myocardial infarction) (HCC)   . Mild pulmonary hypertension (HCC)   . Obesity, unspecified   . Onychia and paronychia of toe   . Osteoarthrosis, unspecified whether generalized or localized, unspecified site   . Other and unspecified hyperlipidemia   . Sickle-cell trait (HCC)   . Sinus bradycardia    a. HR 40s even on low dose metoprolol - BB stopped with resolution.  . Streptococcal sore throat   . Type II or unspecified type diabetes mellitus without mention of complication, not stated as uncontrolled     Past Surgical History:  Procedure Laterality Date  . ANGIOPLASTY  09/21/00  . CARDIAC CATHETERIZATION    . TOTAL KNEE ARTHROPLASTY Right 02/23/2020   Procedure: RIGHT TOTAL KNEE ARTHROPLASTY;  Surgeon: Tarry KosXu, Naiping M, MD;  Location: [facility] OR;   Service: Orthopedics;  Laterality: Right;    Current Medications: Current Meds  Medication Sig  . acetaminophen (TYLENOL) 650 MG CR tablet Take 1,300 mg by mouth in the morning and at bedtime.  Marland Kitchen. amLODipine (NORVASC) 10 MG tablet Take 1 tablet (10 mg total) by mouth daily. Please keep upcoming appt in September for future refills. Thank you  . aspirin EC 81 MG tablet Take 1 tablet (81 mg total) by mouth daily.  Marland Kitchen. atorvastatin (LIPITOR) 80 MG tablet Take 1 tablet (80 mg total) by mouth daily. Please keep upcoming appt in September for future refills. Thank you  . Calcium Carbonate (CALCIUM 600) 1500 MG TABS Take 600 mg by mouth 2 (two) times daily.   . cetirizine (ZYRTEC) 10 MG tablet Take 1 tablet (10 mg total) by mouth daily.  . cholecalciferol (VITAMIN D3) 25 MCG (1000 UNIT) tablet Take 1,000 Units by mouth daily.  . hydrochlorothiazide (HYDRODIURIL) 25 MG tablet Take 1 tablet (25 mg total) by mouth daily. Please keep upcoming appt in September for future refills. Thank you  . isosorbide mononitrate (IMDUR) 30 MG 24 hr tablet TAKE ONE TABLET BY MOUTH DAILY. Please keep upcoming appt in September for future refills. Thank you  . losartan (COZAAR) 25 MG tablet Take 1 tablet (25 mg total) by mouth daily. Please keep upcoming appt in September for future refills. Thank you  . Multiple Vitamins-Minerals (MULTIVITAMINS THER. W/MINERALS) TABS Take 1 tablet by mouth daily.    .Marland Kitchen  Omega-3 Fatty Acids (FISH OIL) 1200 MG CAPS Take 1 capsule by mouth 2 (two) times daily.    . traMADol (ULTRAM) 50 MG tablet TAKE 1-2 TABLETS BY MOUTH DAILY AS NEEDED     Allergies:   Ace inhibitors, Shellfish allergy, and Zocor [simvastatin]   Social History   Socioeconomic History  . Marital status: Married    Spouse name: Not on file  . Number of children: 2  . Years of education: Not on file  . Highest education level: Not on file  Occupational History  . Occupation: DAY Associate Professor: SELF-EMPLOYED  Tobacco  Use  . Smoking status: Former Games developer  . Smokeless tobacco: Never Used  . Tobacco comment: quit nov 2001  Vaping Use  . Vaping Use: Never used  Substance and Sexual Activity  . Alcohol use: No  . Drug use: No  . Sexual activity: Yes    Birth control/protection: Post-menopausal  Other Topics Concern  . Not on file  Social History Narrative   .soc   Social Determinants of Health   Financial Resource Strain:   . Difficulty of Paying Living Expenses: Not on file  Food Insecurity:   . Worried About Programme researcher, broadcasting/film/video in the Last Year: Not on file  . Ran Out of Food in the Last Year: Not on file  Transportation Needs:   . Lack of Transportation (Medical): Not on file  . Lack of Transportation (Non-Medical): Not on file  Physical Activity:   . Days of Exercise per Week: Not on file  . Minutes of Exercise per Session: Not on file  Stress:   . Feeling of Stress : Not on file  Social Connections:   . Frequency of Communication with Friends and Family: Not on file  . Frequency of Social Gatherings with Friends and Family: Not on file  . Attends Religious Services: Not on file  . Active Member of Clubs or Organizations: Not on file  . Attends Banker Meetings: Not on file  . Marital Status: Not on file     Family History:  The patient's   family history includes Breast cancer in her sister; Diabetes type II in her mother and sister; Hypertension in her mother; Sickle cell anemia in her daughter.   ROS:   Please see the history of present illness.    ROS All other systems reviewed and are negative.   PHYSICAL EXAM:   VS:  BP 130/86   Pulse 94   Ht 5\' 1"  (1.549 m)   Wt 198 lb 3.2 oz (89.9 kg)   SpO2 90%   BMI 37.45 kg/m   Physical Exam  GEN: Well nourished, well developed, in no acute distress  Neck: no JVD, carotid bruits, or masses Cardiac:RRR; 2/6 sys murmur LSB Respiratory:  clear to auscultation bilaterally, normal work of breathing GI: soft, nontender,  nondistended, + BS Ext: without cyanosis, clubbing, or edema, Good distal pulses bilaterally Neuro:  Alert and Oriented x 3 Psych: euthymic mood, full affect  Wt Readings from Last 3 Encounters:  07/07/20 198 lb 3.2 oz (89.9 kg)  06/18/20 198 lb 6.4 oz (90 kg)  05/18/20 197 lb (89.4 kg)      Studies/Labs Reviewed:   EKG:  EKG is not ordered today.   Recent Labs: 01/15/2020: Magnesium 2.0 02/19/2020: ALT 31 02/24/2020: BUN 11; Creatinine, Ser 0.69; Potassium 4.3; Sodium 139 06/18/2020: Hemoglobin 13.0; Platelets 312; TSH 1.170   Lipid Panel    Component  Value Date/Time   CHOL 160 01/15/2020 1601   TRIG 64 01/15/2020 1601   HDL 60 01/15/2020 1601   CHOLHDL 2.7 01/15/2020 1601   CHOLHDL 2.4 03/26/2015 1033   VLDL 14 03/26/2015 1033   LDLCALC 87 01/15/2020 1601   LDLDIRECT 63 03/15/2012 1520    Additional studies/ records that were reviewed today include:  2D echo 9/28/2020IMPRESSIONS     1. Left ventricular ejection fraction, by visual estimation, is 60 to  65%. The left ventricle has normal function. Normal left ventricular size.  There is mildly increased left ventricular hypertrophy.   2. Left ventricular diastolic Doppler parameters are consistent with  impaired relaxation pattern of LV diastolic filling.   3. Global longitudinal strain= -23%.   4. Global right ventricle has normal systolic function.The right  ventricular size is normal. No increase in right ventricular wall  thickness.   FINDINGS   Left Ventricle: Left ventricular ejection fraction, by visual estimation,  is 60 to 65%. The left ventricle has normal function. There is mildly  increased left ventricular hypertrophy. Normal left ventricular size.  Spectral Doppler shows Left ventricular   diastolic Doppler parameters are consistent with impaired relaxation  pattern of LV diastolic filling. Global longitudinal strain= -23%.   Right Ventricle: The right ventricular size is normal. No increase in    right ventricular wall thickness. Global RV systolic function is has  normal systolic function. The tricuspid regurgitant velocity is 2.42 m/s,  and with an assumed right atrial pressure   of 3 mmHg, the estimated right ventricular systolic pressure is normal at  26.4 mmHg.   Left Atrium: Left atrial size was normal in size.   Right Atrium: Right atrial size was mildly dilated   Pericardium: There is no evidence of pericardial effusion.   Mitral Valve: The mitral valve is abnormal. There is mild thickening of  the mitral valve leaflet(s). Trace mitral valve regurgitation.   Tricuspid Valve: The tricuspid valve is normal in structure. Tricuspid  valve regurgitation is trivial by color flow Doppler.   Aortic Valve: The aortic valve is tricuspid. Aortic valve regurgitation  was not visualized by color flow Doppler. Mild to moderate aortic valve  sclerosis/calcification is present, without any evidence of aortic  stenosis.   Pulmonic Valve: The pulmonic valve was normal in structure. Pulmonic valve  regurgitation is trivial by color flow Doppler.   Aorta: The aortic root is normal in size and structure.   IAS/Shunts: No atrial level shunt detected by color flow Doppler.          ASSESSMENT:    1. Coronary artery disease involving native coronary artery of native heart without angina pectoris   2. Essential hypertension   3. Hyperlipidemia, unspecified hyperlipidemia type   4. Obesity (BMI 30-39.9)   5. Controlled type 2 diabetes mellitus without complication, without long-term current use of insulin (HCC)      PLAN:  In order of problems listed above:  CAD status post MI in 2001 treated with PTCA/stenting to the mid distal junction of RCA, normal LVEF on most recent echo 06/2019  Essential hypertension BP controlled  Hyperlipidemia LDL 87 01/2020 will add Zetia 10 mg once daily and f/u labs in 3 months  Obesity-gained 10 lbs with covid19 and knee replacement.  Walking again, declines referral to weight loss clinic  DM off metformin and A1C 6.1. would like referral to nutritionist.    Medication Adjustments/Labs and Tests Ordered: Current medicines are reviewed at length with the patient  today.  Concerns regarding medicines are outlined above.  Medication changes, Labs and Tests ordered today are listed in the Patient Instructions below. Patient Instructions  Medication Instructions:  Your physician has recommended you make the following change in your medication:   START: Zetia 10mg  daily  *If you need a refill on your cardiac medications before your next appointment, please call your pharmacy*   Lab Work: Your physician recommends that you return for a FASTING lipid profile: 10/06/2020   If you have labs (blood work) drawn today and your tests are completely normal, you will receive your results only by: 10/08/2020 MyChart Message (if you have MyChart) OR . A paper copy in the mail If you have any lab test that is abnormal or we need to change your treatment, we will call you to review the results.   Testing/Procedures: None   Follow-Up: At [facility], you and your health needs are our priority.  As part of our continuing mission to provide you with exceptional heart care, we have created designated Provider Care Teams.  These Care Teams include your primary Cardiologist (physician) and Advanced Practice Providers (APPs -  Physician Assistants and Nurse Practitioners) who all work together to provide you with the care you need, when you need it.  We recommend signing up for the patient portal called "MyChart".  Sign up information is provided on this After Visit Summary.  MyChart is used to connect with patients for Virtual Visits (Telemedicine).  Patients are able to view lab/test results, encounter notes, upcoming appointments, etc.  Non-urgent messages can be sent to your provider as well.   To learn more about what you can do with  MyChart, go to [facility].    Your next appointment:   12 month(s)  The format for your next appointment:   In Person  Provider:   [email or web address], MD   Other Instructions You have been referred for Diabetic Nutritionist. You will get a call from them to schedule this appointment.     Tobias Alexander, PA-C  07/07/2020 8:43 AM    [facility] [address] [place], [place], [place]  [place] Phone: [phone]; Fax: [phone]

## 2020-07-07 ENCOUNTER — Ambulatory Visit (INDEPENDENT_AMBULATORY_CARE_PROVIDER_SITE_OTHER): Payer: Medicare HMO | Admitting: Physician Assistant

## 2020-07-07 ENCOUNTER — Other Ambulatory Visit: Payer: Self-pay

## 2020-07-07 ENCOUNTER — Encounter: Payer: Self-pay | Admitting: Physician Assistant

## 2020-07-07 VITALS — BP 130/86 | HR 94 | Ht 61.0 in | Wt 198.2 lb

## 2020-07-07 DIAGNOSIS — E119 Type 2 diabetes mellitus without complications: Secondary | ICD-10-CM

## 2020-07-07 DIAGNOSIS — I10 Essential (primary) hypertension: Secondary | ICD-10-CM

## 2020-07-07 DIAGNOSIS — I2510 Atherosclerotic heart disease of native coronary artery without angina pectoris: Secondary | ICD-10-CM | POA: Diagnosis not present

## 2020-07-07 DIAGNOSIS — E785 Hyperlipidemia, unspecified: Secondary | ICD-10-CM

## 2020-07-07 DIAGNOSIS — E669 Obesity, unspecified: Secondary | ICD-10-CM | POA: Diagnosis not present

## 2020-07-07 MED ORDER — EZETIMIBE 10 MG PO TABS: 10.0000 mg | ORAL_TABLET | Freq: Every day | ORAL | 3 refills | Status: DC

## 2020-07-07 NOTE — Patient Instructions (Signed)
Medication Instructions:  Your physician has recommended you make the following change in your medication:   START: Zetia 10mg  daily  *If you need a refill on your cardiac medications before your next appointment, please call your pharmacy*   Lab Work: Your physician recommends that you return for a FASTING lipid profile: 10/06/2020   If you have labs (blood work) drawn today and your tests are completely normal, you will receive your results only by: 10/08/2020 MyChart Message (if you have MyChart) OR . A paper copy in the mail If you have any lab test that is abnormal or we need to change your treatment, we will call you to review the results.   Testing/Procedures: None   Follow-Up: At [facility], you and your health needs are our priority.  As part of our continuing mission to provide you with exceptional heart care, we have created designated Provider Care Teams.  These Care Teams include your primary Cardiologist (physician) and Advanced Practice Providers (APPs -  Physician Assistants and Nurse Practitioners) who all work together to provide you with the care you need, when you need it.  We recommend signing up for the patient portal called "MyChart".  Sign up information is provided on this After Visit Summary.  MyChart is used to connect with patients for Virtual Visits (Telemedicine).  Patients are able to view lab/test results, encounter notes, upcoming appointments, etc.  Non-urgent messages can be sent to your provider as well.   To learn more about what you can do with MyChart, go to [facility].    Your next appointment:   12 month(s)  The format for your next appointment:   In Person  Provider:   [email or web address], MD   Other Instructions You have been referred for Diabetic Nutritionist. You will get a call from them to schedule this appointment.

## 2020-07-08 ENCOUNTER — Other Ambulatory Visit: Payer: Self-pay

## 2020-07-08 DIAGNOSIS — E669 Obesity, unspecified: Secondary | ICD-10-CM

## 2020-07-08 DIAGNOSIS — E119 Type 2 diabetes mellitus without complications: Secondary | ICD-10-CM

## 2020-07-08 NOTE — Progress Notes (Signed)
Order entry for referral diabetic nutritional council.

## 2020-07-29 ENCOUNTER — Other Ambulatory Visit: Payer: Self-pay

## 2020-07-29 ENCOUNTER — Encounter: Payer: Self-pay | Admitting: Registered"

## 2020-07-29 ENCOUNTER — Encounter: Payer: Medicare HMO | Attending: Physician Assistant | Admitting: Registered"

## 2020-07-29 DIAGNOSIS — E119 Type 2 diabetes mellitus without complications: Secondary | ICD-10-CM

## 2020-07-29 NOTE — Patient Instructions (Signed)
Goals:  Follow Diabetes Meal Plan as instructed  Eat 3 meals and 2 snacks, every 3-5 hrs  Aim to have 1/2 plate non-starchy vegetables, 1/4 plate protein, and 1/4 plate starch/grain with lunch and dinner. See page 21 in book.   Add lean protein foods to meals/snacks  Monitor glucose levels as instructed by your doctor  Aim for 30 mins of physical activity daily  Practice checking blood sugar once a day, fasting. Fasting blood sugar numbers should ideally be 80-130. See page 14 in book.

## 2020-07-29 NOTE — Progress Notes (Signed)
Diabetes Self-Management Education  Visit Type:  First/Initial  Appt. Start Time: 8:08 Appt. End Time: 9:30  07/29/2020  Terri Estrada, identified by name and date of birth, is a 69 y.o. female with a diagnosis of Diabetes: Type 2.   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 Planing to have another knee replacement 09/2020.   States she walks 1 mile, 5 days/week in the morning with her sister. States she does not eat prior to walking.   States she feels like her A1c being 6.1 is too close to 7. States she doesn't want to be placed back on medication. States she does not know how to check blood sugar numbers at home, only has A1C checked at hospital or appointments. Provided glucometer to pt: AccuChek Guide Me Lot # [id number] Exp 07/28/2021  States she likes to eat fruit. Heard that grapes have too much sugar in them.   Reports eye changes in the last month  States it has helped her to lose weight not eating past 7:30 pm. States she was still doing that during the pandemic and gained 10 lbs. States she aims to drink 8*16 oz bottles a day; 128 oz.   Pt expectations: what she can eat/can't eat   There were no vitals taken for this visit. There is no height or weight on file to calculate BMI.    Diabetes Self-Management Education - 07/29/20 0820      Health Coping   How would you rate your overall health? Good      Psychosocial Assessment   Patient Belief/Attitude about Diabetes Motivated to manage diabetes    Self-care barriers None    Self-management support Family    Patient Concerns Nutrition/Meal planning    Special Needs None    Preferred Learning Style No preference indicated    Learning Readiness Change in progress      Complications   Last HgB A1C per patient/outside source 6.1 %    How often do you check your blood sugar? 0 times/day (not testing)    Number of  hypoglycemic episodes per month 0    Number of hyperglycemic episodes per week 0    Have you had a dilated eye exam in the past 12 months? Yes    Have you had a dental exam in the past 12 months? No    Are you checking your feet? Yes    How many days per week are you checking your feet? 2      Dietary Intake   Breakfast 11:30 am - cheerios + banana    Lunch skipped    Dinner 4:30 pm - baked chicken + kale + creamed potatoes    Beverage(s) 2% milk, water (5*16 oz; 80 oz)      Exercise   Exercise Type Light (walking / raking leaves)    How many days per week to you exercise? 5    How many minutes per day do you exercise? 60    Total minutes per week of exercise 300      Patient Education   Previous Diabetes Education No    Disease state  Definition of diabetes, type 1 and 2, and the diagnosis of diabetes;Factors that contribute to the development of diabetes    Nutrition management  Role of diet in the treatment of diabetes and the relationship between the three main macronutrients and blood glucose level;Food label reading,  portion sizes and measuring food.;Reviewed blood glucose goals for pre and post meals and how to evaluate the patients' food intake on their blood glucose level.    Physical activity and exercise  Role of exercise on diabetes management, blood pressure control and cardiac health.    Monitoring Taught/evaluated SMBG meter.;Purpose and frequency of SMBG.;Taught/discussed recording of test results and interpretation of SMBG.;Interpreting lab values - A1C, lipid, urine microalbumina.;Identified appropriate SMBG and/or A1C goals.;Daily foot exams;Yearly dilated eye exam    Chronic complications Lipid levels, blood glucose control and heart disease;Retinopathy and reason for yearly dilated eye exams;Applicable immunizations;Reviewed with patient heart disease, higher risk of, and prevention    Psychosocial adjustment Role of stress on diabetes;Identified and addressed patients  feelings and concerns about diabetes    Personal strategies to promote health Lifestyle issues that need to be addressed for better diabetes care      Individualized Goals (developed by patient)   Nutrition General guidelines for healthy choices and portions discussed    Physical Activity Exercise 5-7 days per week;60 minutes per day    Medications Not Applicable    Monitoring  test my blood glucose as discussed    Reducing Risk examine blood glucose patterns;do foot checks daily    Health Coping ask for help with (comment)   when returning to follow-up appointment     Post-Education Assessment   Patient understands the diabetes disease and treatment process. Demonstrates understanding / competency    Patient understands incorporating nutritional management into lifestyle. Demonstrates understanding / competency    Patient undertands incorporating physical activity into lifestyle. Demonstrates understanding / competency    Patient understands using medications safely. Demonstrates understanding / competency    Patient understands monitoring blood glucose, interpreting and using results Needs Review    Patient understands prevention, detection, and treatment of acute complications. Needs Instruction    Patient understands prevention, detection, and treatment of chronic complications. Needs Review    Patient understands how to develop strategies to address psychosocial issues. Demonstrates understanding / competency    Patient understands how to develop strategies to promote health/change behavior. Demonstrates understanding / competency      Outcomes   Program Status Not Completed           Learning Objective:  Patient will have a greater understanding of diabetes self-management. Patient education plan is to attend individual and/or group sessions per assessed needs and concerns.   Plan:   Patient Instructions  Goals:  Follow Diabetes Meal Plan as instructed  Eat 3 meals and 2  snacks, every 3-5 hrs  Aim to have 1/2 plate non-starchy vegetables, 1/4 plate protein, and 1/4 plate starch/grain with lunch and dinner. See page 21 in book.   Add lean protein foods to meals/snacks  Monitor glucose levels as instructed by your doctor  Aim for 30 mins of physical activity daily  Practice checking blood sugar once a day, fasting. Fasting blood sugar numbers should ideally be 80-130. See page 14 in book.      Expected Outcomes:  Demonstrated interest in learning. Expect positive outcomes  Education material provided: ADA - How to Thrive: A Guide for Your Journey with Diabetes  If problems or questions, patient to contact team via:  Phone and Email  Future DSME appointment: - Other (comment) (3 weeks)

## 2020-07-30 ENCOUNTER — Other Ambulatory Visit: Payer: Self-pay

## 2020-08-17 ENCOUNTER — Ambulatory Visit (INDEPENDENT_AMBULATORY_CARE_PROVIDER_SITE_OTHER): Payer: Medicare HMO | Admitting: Orthopaedic Surgery

## 2020-08-17 ENCOUNTER — Other Ambulatory Visit: Payer: Self-pay

## 2020-08-17 ENCOUNTER — Encounter: Payer: Self-pay | Admitting: Orthopaedic Surgery

## 2020-08-17 ENCOUNTER — Ambulatory Visit (INDEPENDENT_AMBULATORY_CARE_PROVIDER_SITE_OTHER): Payer: Medicare HMO

## 2020-08-17 DIAGNOSIS — Z96651 Presence of right artificial knee joint: Secondary | ICD-10-CM

## 2020-08-17 MED ORDER — TRAMADOL HCL 50 MG PO TABS
50.0000 mg | ORAL_TABLET | Freq: Three times a day (TID) | ORAL | 0 refills | Status: DC | PRN
Start: 2020-08-17 — End: 2020-11-29

## 2020-08-17 NOTE — Progress Notes (Signed)
Post-Op Visit Note   Patient: Terri Estrada           Date of Birth: Sep 18, 1951           MRN: [id number] Visit Date: 08/17/2020 PCP: Ronnald Ramp, MD   Assessment & Plan:  Chief Complaint:  Chief Complaint  Patient presents with  . Right Knee - Pain   Visit Diagnoses:  1. Status post total right knee replacement     Plan: Patient is a pleasant 69 year old female who comes in today 6 months out right total knee replacement 02/23/2020.  She has been doing well.  She still notes slight inflammation to the right knee as well as decrease sensation to the lateral aspect.  She has been walking about a mile a day which does seem to help.  She is scheduled to have a left total knee replacement next month.  Examination of her right knee reveals range of motion from 0 to 110 degrees.  Stable valgus varus stress.  She is neurovascular intact distally.  At this point, she will continue to improve creased activity as tolerated.  Dental prophylaxis reinforced.  Follow-up with [place] in 6 months time for repeat evaluation and AP and lateral x-rays of the right knee.  Call with concerns or questions.  Follow-Up Instructions: Return in about 6 months (around 02/14/2021).   Orders:  Orders Placed This Encounter  Procedures  . XR Knee 1-2 Views Right   No orders of the defined types were placed in this encounter.   Imaging: XR Knee 1-2 Views Right  Result Date: 08/17/2020 X-rays reveal a well-seated prosthesis without complication   PMFS History: Patient Active Problem List   Diagnosis Date Noted  . Fatigue 06/18/2020  . Status post total right knee replacement 02/23/2020  . Primary osteoarthritis of right knee 02/22/2020  . Acute seasonal allergic rhinitis 12/19/2019  . Pre-op evaluation 10/23/2019  . Murmur 12/10/2015  . Pes planus of both feet 01/07/2015  . Depression 01/07/2015  . Female cystocele 06/26/2014  . Healthcare maintenance 12/17/2012  . Osteoarthritis  06/21/2010  . Hyperlipidemia 05/31/2009  . DIVERTICULOSIS, COLON 10/16/2008  . AORTIC STENOSIS, MILD 06/02/2008  . Diabetes mellitus type 2, controlled, without complications (HCC) 12/06/2006  . Obesity (BMI 30-39.9) 12/06/2006  . SICKLE CELL TRAIT 12/06/2006  . HYPERTENSION, BENIGN SYSTEMIC 12/06/2006  . CORONARY, ARTERIOSCLEROSIS 12/06/2006   Past Medical History:  Diagnosis Date  . Arthritis   . CAD in native artery    a. MI 2001 s/p PTCA/stenting to mid-distal junction of RCA - residual dx treated medically.  . Depression    pt reports having it intermittently  . Diverticulosis of colon (without mention of hemorrhage)   . Essential hypertension, benign   . MI (myocardial infarction) (HCC)   . Mild pulmonary hypertension (HCC)   . Obesity, unspecified   . Onychia and paronychia of toe   . Osteoarthrosis, unspecified whether generalized or localized, unspecified site   . Other and unspecified hyperlipidemia   . Sickle-cell trait (HCC)   . Sinus bradycardia    a. HR 40s even on low dose metoprolol - BB stopped with resolution.  . Streptococcal sore throat   . Type II or unspecified type diabetes mellitus without mention of complication, not stated as uncontrolled     Family History  Problem Relation Age of Onset  . Sickle cell anemia Daughter   . Hypertension Mother   . Diabetes type II Mother   . Diabetes type II Sister   .  Breast cancer Sister   . Cancer Other   . Stroke Other   . Diabetes Other     Past Surgical History:  Procedure Laterality Date  . ANGIOPLASTY  09/21/00  . CARDIAC CATHETERIZATION    . TOTAL KNEE ARTHROPLASTY Right 02/23/2020   Procedure: RIGHT TOTAL KNEE ARTHROPLASTY;  Surgeon: Tarry Kos, MD;  Location: [facility] OR;  Service: Orthopedics;  Laterality: Right;   Social History   Occupational History  . Occupation: DAY Associate Professor: SELF-EMPLOYED  Tobacco Use  . Smoking status: Former Games developer  . Smokeless tobacco: Never Used  . Tobacco  comment: quit nov 2001  Vaping Use  . Vaping Use: Never used  Substance and Sexual Activity  . Alcohol use: No  . Drug use: No  . Sexual activity: Yes    Birth control/protection: Post-menopausal

## 2020-08-19 ENCOUNTER — Encounter: Payer: Self-pay | Admitting: Registered"

## 2020-08-19 ENCOUNTER — Other Ambulatory Visit: Payer: Self-pay

## 2020-08-19 ENCOUNTER — Encounter: Payer: Medicare HMO | Attending: Physician Assistant | Admitting: Registered"

## 2020-08-19 DIAGNOSIS — E119 Type 2 diabetes mellitus without complications: Secondary | ICD-10-CM | POA: Diagnosis not present

## 2020-08-19 NOTE — Progress Notes (Signed)
Diabetes Self-Management Education  Visit Type:  Follow-up  Appt. Start Time: 8:10 Appt. End Time: 9:02  08/19/2020  [name], identified by name and date of birth, is a 69 y.o. female with a diagnosis of Diabetes: Type 2.   ASSESSMENT  States she has been checking BS once a day: FBS (108-121). The first day BS was 135 but has declined since. Pt denies hypo- and/or hyperglycemic episodes.   States she has a cheat day on Sunday because she enjoys cake, candy, and other sweets sometimes and does not want to omit them from her eating regimen.   States she has been eating boiled eggs + Slim Fast every day since last Sept 2020. States she was told her cholesterol levels were concerning and has not been eating many boiled eggs.   There were no vitals taken for this visit. There is no height or weight on file to calculate BMI.    Diabetes Self-Management Education - 08/19/20 0815      Psychosocial Assessment   Self-care barriers None    Special Needs None    Preferred Learning Style No preference indicated    Learning Readiness Change in progress      Complications   Last HgB A1C per patient/outside source 6.1 %    How often do you check your blood sugar? 1-2 times/day    Fasting Blood glucose range (mg/dL) 69-485;462-703    Number of hypoglycemic episodes per month 0    Number of hyperglycemic episodes per week 0    Have you had a dilated eye exam in the past 12 months? Yes    Have you had a dental exam in the past 12 months? No    Are you checking your feet? Yes    How many days per week are you checking your feet? 7      Dietary Intake   Breakfast 9 am - egg white + Slim Fast (1 g sugar)    Lunch 12 pm - cereal + 2% milk + banana    Dinner 4 pm - lasagna + spinach      Exercise   Exercise Type ADL's;Light (walking / raking leaves)    How many days per week to you exercise? 5    How many minutes per day do you exercise? 45    Total minutes per week of exercise  225      Patient Education   Previous Diabetes Education Yes (please comment)    Medications Reviewed patients medication for diabetes, action, purpose, timing of dose and side effects.    Acute complications Taught treatment of hypoglycemia - the 15 rule.;Discussed and identified patients' treatment of hyperglycemia.      Post-Education Assessment   Patient understands the diabetes disease and treatment process. Demonstrates understanding / competency    Patient understands incorporating nutritional management into lifestyle. Demonstrates understanding / competency    Patient undertands incorporating physical activity into lifestyle. Demonstrates understanding / competency    Patient understands using medications safely. Demonstrates understanding / competency    Patient understands monitoring blood glucose, interpreting and using results Demonstrates understanding / competency    Patient understands prevention, detection, and treatment of acute complications. Demonstrates understanding / competency    Patient understands prevention, detection, and treatment of chronic complications. Demonstrates understanding / competency    Patient understands how to develop strategies to address psychosocial issues. Demonstrates understanding / competency    Patient understands how to develop strategies to promote health/change behavior. Demonstrates  understanding / competency      Outcomes   Program Status Completed      Subsequent Visit   Since your last visit have you continued or begun to take your medications as prescribed? Yes    Since your last visit have you had your blood pressure checked? No    Since your last visit have you experienced any weight changes? No change    Since your last visit, are you checking your blood glucose at least once a day? Yes           Learning Objective:  Patient will have a greater understanding of diabetes self-management. Patient education plan is to attend  individual and/or group sessions per assessed needs and concerns.   Plan:   Patient Instructions  - Continue to keep up the great work with being active!  - Aim to have at least 1/2 pate non-starchy vegetables with lunch and dinner.   - Add source of carbohydrates to breakfast such as clementine, handful of grapes, oatmeal, grits, whole wheat bread, etc.   - Continue to check blood sugar at least once a day.      Expected Outcomes:  Demonstrated interest in learning. Expect positive outcomes  Education material provided: none  If problems or questions, patient to contact team via:  Phone and Email  Future DSME appointment: - PRN

## 2020-08-19 NOTE — Patient Instructions (Signed)
-   Continue to keep up the great work with being active!  - Aim to have at least 1/2 pate non-starchy vegetables with lunch and dinner.   - Add source of carbohydrates to breakfast such as clementine, handful of grapes, oatmeal, grits, whole wheat bread, etc.   - Continue to check blood sugar at least once a day.

## 2020-08-24 ENCOUNTER — Other Ambulatory Visit: Payer: Self-pay

## 2020-08-24 ENCOUNTER — Ambulatory Visit (INDEPENDENT_AMBULATORY_CARE_PROVIDER_SITE_OTHER): Payer: Medicare HMO | Admitting: Podiatry

## 2020-08-24 DIAGNOSIS — E119 Type 2 diabetes mellitus without complications: Secondary | ICD-10-CM

## 2020-08-24 DIAGNOSIS — Z794 Long term (current) use of insulin: Secondary | ICD-10-CM

## 2020-08-30 NOTE — Progress Notes (Signed)
Subjective: 69 year old female presents the office today for yearly diabetic exam.  States that she has been doing well and she has no concerns today.  Denies any open lesions.  Denies any claudication symptoms or numbness or tingling.  Last A1c she reports was 6.1.  This was performed on June 18, 2020. Denies any systemic complaints such as fevers, chills, nausea, vomiting. No acute changes since last appointment, and no other complaints at this time.   Objective: AAO x3, NAD DP/PT pulses palpable bilaterally, CRT less than 3 seconds Sensation intact with Semmes Weinstein monofilament. Adductovarus present of the lesser digits most notably on the right side the third, fourth, fifth toes.  There is no area tenderness there is no palpable today.  There is no pain with the toe today there is no numbness or tingling.  No open lesions or pre-ulcerative lesions.  MMT 5/5. No pain with calf compression, swelling, warmth, erythema  Assessment: Controlled type 2 diabetes  Plan: -All treatment options discussed with the patient including all alternatives, risks, complications.  -Overall she is doing well.  Discussed daily foot inspection.-Patient encouraged to call the office with any questions, concerns, change in symptoms.   Return in about 1 year   Vivi Barrack DPM

## 2020-09-08 ENCOUNTER — Other Ambulatory Visit: Payer: Self-pay

## 2020-09-08 ENCOUNTER — Ambulatory Visit (INDEPENDENT_AMBULATORY_CARE_PROVIDER_SITE_OTHER): Payer: Medicare HMO

## 2020-09-08 DIAGNOSIS — Z23 Encounter for immunization: Secondary | ICD-10-CM

## 2020-09-08 NOTE — Progress Notes (Signed)
   Covid-19 Vaccination Clinic  Name:  Terri Estrada    MRN: [id number] DOB: [birth year]  09/08/2020  Terri Estrada was observed post Covid-19 immunization for 15 minutes without incident. She was provided with Vaccine Information Sheet and instruction to access the V-Safe system.   Terri Estrada was instructed to call 911 with any severe reactions post vaccine: Marland Kitchen Difficulty breathing  . Swelling of face and throat  . A fast heartbeat  . A bad rash all over body  . Dizziness and weakness   Booster administered RD without complication.

## 2020-09-18 ENCOUNTER — Other Ambulatory Visit: Payer: Self-pay | Admitting: Cardiology

## 2020-09-18 DIAGNOSIS — E785 Hyperlipidemia, unspecified: Secondary | ICD-10-CM

## 2020-09-18 DIAGNOSIS — I10 Essential (primary) hypertension: Secondary | ICD-10-CM

## 2020-09-20 ENCOUNTER — Other Ambulatory Visit: Payer: Self-pay

## 2020-09-20 DIAGNOSIS — E785 Hyperlipidemia, unspecified: Secondary | ICD-10-CM

## 2020-09-20 DIAGNOSIS — I10 Essential (primary) hypertension: Secondary | ICD-10-CM

## 2020-09-20 MED ORDER — ISOSORBIDE MONONITRATE ER 30 MG PO TB24: ORAL_TABLET | ORAL | 2 refills | Status: DC

## 2020-09-20 MED ORDER — HYDROCHLOROTHIAZIDE 25 MG PO TABS: 25.0000 mg | ORAL_TABLET | Freq: Every day | ORAL | 2 refills | Status: DC

## 2020-09-20 MED ORDER — ATORVASTATIN CALCIUM 80 MG PO TABS: 80.0000 mg | ORAL_TABLET | Freq: Every day | ORAL | 2 refills | Status: DC

## 2020-10-06 ENCOUNTER — Other Ambulatory Visit: Payer: Medicare HMO | Admitting: *Deleted

## 2020-10-06 ENCOUNTER — Other Ambulatory Visit: Payer: Self-pay

## 2020-10-06 DIAGNOSIS — I2510 Atherosclerotic heart disease of native coronary artery without angina pectoris: Secondary | ICD-10-CM

## 2020-10-06 DIAGNOSIS — E669 Obesity, unspecified: Secondary | ICD-10-CM

## 2020-10-06 DIAGNOSIS — E785 Hyperlipidemia, unspecified: Secondary | ICD-10-CM

## 2020-10-06 DIAGNOSIS — E119 Type 2 diabetes mellitus without complications: Secondary | ICD-10-CM

## 2020-10-06 DIAGNOSIS — I10 Essential (primary) hypertension: Secondary | ICD-10-CM

## 2020-10-06 LAB — HEPATIC FUNCTION PANEL
ALT: 24 IU/L (ref 0–32)
AST: 30 IU/L (ref 0–40)
Albumin: 4.7 g/dL (ref 3.8–4.8)
Alkaline Phosphatase: 88 IU/L (ref 44–121)
Bilirubin Total: 0.4 mg/dL (ref 0.0–1.2)
Bilirubin, Direct: 0.15 mg/dL (ref 0.00–0.40)
Total Protein: 7.2 g/dL (ref 6.0–8.5)

## 2020-10-06 LAB — LIPID PANEL
Chol/HDL Ratio: 2.1 ratio (ref 0.0–4.4)
Cholesterol, Total: 119 mg/dL (ref 100–199)
HDL: 57 mg/dL (ref >39)
LDL Chol Calc (NIH): 51 mg/dL (ref 0–99)
Triglycerides: 43 mg/dL (ref 0–149)
VLDL Cholesterol Cal: 11 mg/dL (ref 5–40)

## 2020-10-11 ENCOUNTER — Other Ambulatory Visit: Payer: Self-pay

## 2020-10-11 ENCOUNTER — Ambulatory Visit: Payer: Medicare HMO | Admitting: Family Medicine

## 2020-10-11 NOTE — Progress Notes (Deleted)
    SUBJECTIVE:   CHIEF COMPLAINT / HPI: DM f/u    Terri Estrada presents for DM follow up.  She reports blood glucose ranges from ***-***  PERTINENT  PMH / PSH: ***  OBJECTIVE:   There were no vitals taken for this visit.  ***  ASSESSMENT/PLAN:   No problem-specific Assessment & Plan notes found for this encounter.     Ronnald Ramp, MD [facility]

## 2020-10-12 ENCOUNTER — Other Ambulatory Visit: Payer: Self-pay | Admitting: Family Medicine

## 2020-10-12 DIAGNOSIS — Z1231 Encounter for screening mammogram for malignant neoplasm of breast: Secondary | ICD-10-CM

## 2020-10-17 NOTE — Progress Notes (Signed)
    SUBJECTIVE:   CHIEF COMPLAINT / HPI: diabetes follow up   Terri Estrada is a 70 year old female with hx of diet controlled T2DM. Her last Hemoglobin A1c in Sept of 2021 was well within goal range of 6.1. Today she denies polyuria, polydipsia, polyphagia and states that she is able to exercise by walking daily. Due to recent cold weather, she is planning to transition her physical activity to the gym. She has been maintaining her diet and has decreased egg consumption to help her LDL cholesterol (87) with recheck in Dec of 2021 that was within gial range of less than 70.  Patient is scheduled for her second knee replacement next month but wants to delay due to covid cases increasing and concern for being in the hospital.   PERTINENT  PMH / PSH:  T2DM, diet controlled   OBJECTIVE:   BP 120/68   Pulse 63   Ht 5\' 1"  (1.549 m)   Wt 194 lb (88 kg)   SpO2 98%   BMI 36.66 kg/m   General: female appearing stated age in no acute distress Cardio: Normal S1 and S2, no S3 or S4. Rhythm is regular. No murmurs or rubs.  Bilateral radial pulses palpable Pulm: Clear to auscultation bilaterally, no crackles, wheezing, or diminished breath sounds. Normal respiratory effort, stable on RA Abdomen: Bowel sounds normal. Abdomen soft and non-tender.  Extremities: No peripheral edema. Warm/ well perfused.   ASSESSMENT/PLAN:   Diabetes mellitus type 2, controlled, without complications Diabetes diet controlled. Patient doing well with regular physical activity since knee replacement.  -Hgb A1c checked today, will follow up with results   -Patient to follow up in 6 months  - up to date on ophthalmology exam, next in Sept  -up to date on foot exam     07-28[birth year]1977, MD [facility]

## 2020-10-18 ENCOUNTER — Ambulatory Visit (INDEPENDENT_AMBULATORY_CARE_PROVIDER_SITE_OTHER): Payer: Medicare Other | Admitting: Family Medicine

## 2020-10-18 ENCOUNTER — Encounter: Payer: Self-pay | Admitting: Family Medicine

## 2020-10-18 ENCOUNTER — Other Ambulatory Visit: Payer: Self-pay

## 2020-10-18 VITALS — BP 120/68 | HR 63 | Ht 61.0 in | Wt 194.0 lb

## 2020-10-18 DIAGNOSIS — E119 Type 2 diabetes mellitus without complications: Secondary | ICD-10-CM

## 2020-10-18 NOTE — Patient Instructions (Signed)
It was a pleasure to see you today!  Thank you for choosing [facility] for your primary care.  Terri Estrada was seen for diabetes follow up.   Our plans for today were:  We will wait for the updated results of your hemoglobin A1c. I will notify you if the level has increased and if we need to change your treatment plan.   You are up to date on your health maintenance items, [name] Job!   You should return to our clinic in 6 months for diabetes follow up.   Best Wishes,   Dr. Neita Garnet

## 2020-10-18 NOTE — Assessment & Plan Note (Signed)
Diabetes diet controlled. Patient doing well with regular physical activity since knee replacement.  -Hgb A1c checked today, will follow up with results   -Patient to follow up in 6 months  - up to date on ophthalmology exam, next in Sept  -up to date on foot exam

## 2020-10-19 ENCOUNTER — Inpatient Hospital Stay: Payer: Medicare HMO | Admitting: Orthopaedic Surgery

## 2020-11-12 ENCOUNTER — Telehealth: Payer: Self-pay

## 2020-11-12 NOTE — Telephone Encounter (Signed)
Patient calls nurse line requesting A1C results from appointment on 1/10. After chart review, it appears that lab was never collected on this day.   Scheduled patient lab visit after mammogram on 2/11.  Routing to PCP and Molly Maduro.   Veronda Prude, RN

## 2020-11-17 ENCOUNTER — Ambulatory Visit: Payer: Medicare Other

## 2020-11-19 ENCOUNTER — Other Ambulatory Visit (INDEPENDENT_AMBULATORY_CARE_PROVIDER_SITE_OTHER): Payer: Medicare Other

## 2020-11-19 ENCOUNTER — Ambulatory Visit
Admission: RE | Admit: 2020-11-19 | Discharge: 2020-11-19 | Disposition: A | Discharge status: Home / Self Care | Payer: Medicare Other | Source: Ambulatory Visit | Attending: *Deleted | Admitting: *Deleted

## 2020-11-19 ENCOUNTER — Other Ambulatory Visit: Payer: Self-pay

## 2020-11-19 DIAGNOSIS — E119 Type 2 diabetes mellitus without complications: Secondary | ICD-10-CM

## 2020-11-19 DIAGNOSIS — Z1231 Encounter for screening mammogram for malignant neoplasm of breast: Secondary | ICD-10-CM

## 2020-11-19 LAB — POCT GLYCOSYLATED HEMOGLOBIN (HGB A1C): Hemoglobin A1C: 6.1 % — AB (ref 4.0–5.6)

## 2020-11-26 ENCOUNTER — Telehealth: Payer: Self-pay | Admitting: Orthopaedic Surgery

## 2020-11-26 NOTE — Telephone Encounter (Signed)
Patient called needing Rx refilled (Tramadol) The number to contact patient is [phone]

## 2020-11-26 NOTE — Telephone Encounter (Signed)
If so, please send this into her pharm. Thanks

## 2020-11-29 ENCOUNTER — Other Ambulatory Visit: Payer: Self-pay | Admitting: Physician Assistant

## 2020-11-29 MED ORDER — TRAMADOL HCL 50 MG PO TABS: 50.0000 mg | ORAL_TABLET | Freq: Three times a day (TID) | ORAL | 0 refills | Status: DC | PRN

## 2020-11-29 NOTE — Telephone Encounter (Signed)
Sent in

## 2020-11-29 NOTE — Telephone Encounter (Signed)
I called patient and advised. 

## 2020-12-21 ENCOUNTER — Inpatient Hospital Stay: Payer: Medicare HMO | Admitting: Physician Assistant

## 2020-12-22 ENCOUNTER — Other Ambulatory Visit: Payer: Self-pay | Admitting: Physician Assistant

## 2020-12-22 MED ORDER — OXYCODONE-ACETAMINOPHEN 5-325 MG PO TABS: 1.0000 | ORAL_TABLET | Freq: Four times a day (QID) | ORAL | 0 refills | Status: DC | PRN

## 2020-12-22 MED ORDER — ONDANSETRON HCL 4 MG PO TABS: 4.0000 mg | ORAL_TABLET | Freq: Three times a day (TID) | ORAL | 0 refills | Status: DC | PRN

## 2020-12-22 MED ORDER — DOCUSATE SODIUM 100 MG PO CAPS: 100.0000 mg | ORAL_CAPSULE | Freq: Every day | ORAL | 2 refills | Status: DC | PRN

## 2020-12-22 MED ORDER — ASPIRIN EC 81 MG PO TBEC: 81.0000 mg | DELAYED_RELEASE_TABLET | Freq: Two times a day (BID) | ORAL | 0 refills | Status: DC

## 2020-12-22 MED ORDER — METHOCARBAMOL 500 MG PO TABS: 500.0000 mg | ORAL_TABLET | Freq: Two times a day (BID) | ORAL | 0 refills | Status: DC | PRN

## 2020-12-22 MED ORDER — SULFAMETHOXAZOLE-TRIMETHOPRIM 800-160 MG PO TABS: 1.0000 | ORAL_TABLET | Freq: Two times a day (BID) | ORAL | 0 refills | Status: DC

## 2020-12-22 NOTE — Pre-Procedure Instructions (Signed)
Terri Estrada  12/22/2020    Your procedure is scheduled on Mon., December 27, 2020 from 9:37AM-11:57AM  Report to [facility] Entrance "A" at 7:35AM  Call this number if you have problems the morning of surgery:  [phone]   Remember:  Do not eat after midnight on March 20th  You may drink clear liquids until 3 hours (6:35AM) prior to surgery time .  Clear liquids allowed are:  Water, Juice (non-citric and without pulp - diabetics please choose diet or no sugar options), Carbonated beverages - (diabetics please choose diet or no sugar options), Clear Tea, Black Coffee only (no creamer, milk or cream including half and half), Plain Jell-O only (diabetics please choose diet or no sugar options), Gatorade (diabetics please choose diet or no sugar options) and Plain Popsicles only   Enhanced Recovery after Surgery for Orthopedics Enhanced Recovery after Surgery is a protocol used to improve the stress on your body and your recovery after surgery.  Patient Instructions  . The day of surgery (if you have diabetes):  o Drink ONE small bottle of water by __6:35AM___ the morning of surgery o This bottle was given to you during your hospital  pre-op appointment visit.  o Nothing else to drink after completing the  Small bottle of water.         If you have questions, please contact your surgeon's office.    Take these medicines the morning of surgery with A SIP OF WATER: AmLODipine (NORVASC)  Ezetimibe (ZETIA) Isosorbide mononitrate (IMDUR)  If Needed: Acetaminophen (TYLENOL) Carboxymethylcellulose (REFRESH TEARS) Ondansetron (ZOFRAN) TraMADol (ULTRAM)  As of today, STOP taking all Aspirin (Unless instructed by your doctor) and Other Aspirin containing products, Vitamins, Fish oils, and Herbal medications. Also stop all NSAIDS i.e. Advil, Ibuprofen, Motrin, Aleve, Anaprox, Naproxen, BC, Goody Powders, and all Supplements.  How to Manage Your Diabetes Before and After  Surgery  Why is it important to control my blood sugar before and after surgery? . Improving blood sugar levels before and after surgery helps healing and can limit problems. . A way of improving blood sugar control is eating a healthy diet by: o  Eating less sugar and carbohydrates o  Increasing activity/exercise o  Talking with your doctor about reaching your blood sugar goals . High blood sugars (greater than 180 mg/dL) can raise your risk of infections and slow your recovery, so you will need to focus on controlling your diabetes during the weeks before surgery. . Make sure that the doctor who takes care of your diabetes knows about your planned surgery including the date and location.  How do I manage my blood sugar before surgery? . Check your blood sugar at least 4 times a day, starting 2 days before surgery, to make sure that the level is not too high or low. o Check your blood sugar the morning of your surgery when you wake up and every 2 hours until you get to the Short Stay unit. . If your blood sugar is less than 70 mg/dL, you will need to treat for low blood sugar: o Do not take insulin. o Treat a low blood sugar (less than 70 mg/dL) with  cup of clear juice (cranberry or apple), 4 glucose tablets, OR glucose gel. Recheck blood sugar in 15 minutes after treatment (to make sure it is greater than 70 mg/dL). If your blood sugar is not greater than 70 mg/dL on recheck, call [phone] o  for further instructions. o  If your CBG is greater than 220 mg/dL, call there number above for further instructions.  Reviewed and Endorsed by [facility] Patient Education Committee, August 2015   No Smoking of any kind, Tobacco/Vaping, or Alcohol products 24 hours prior to your procedure. If you use a Cpap at night, you may bring all equipment for your overnight stay.   Day of Surgery:  Do not wear jewelry, make-up or nail polish.  Do not wear lotions, powders, or perfumes, or  deodorant.  Do not shave 48 hours prior to surgery.    Do not bring valuables to the hospital.  [facility] is not responsible for any belongings or valuables.  Contacts, dentures or bridgework may not be worn into surgery.   For patients admitted to the hospital, discharge time will be determined by your treatment team.  Patients discharged the day of surgery will not be allowed to drive home, and someone age 3 and over needs to stay with them for 24 hours.    Special instructions:   [place]- Preparing For Surgery  Before surgery, you can play an important role. Because skin is not sterile, your skin needs to be as free of germs as possible. You can reduce the number of germs on your skin by washing with CHG (chlorahexidine gluconate) Soap before surgery.  CHG is an antiseptic cleaner which kills germs and bonds with the skin to continue killing germs even after washing.    Oral Hygiene is also important to reduce your risk of infection.  Remember - BRUSH YOUR TEETH THE MORNING OF SURGERY WITH YOUR REGULAR TOOTHPASTE  Please do not use if you have an allergy to CHG or antibacterial soaps. If your skin becomes reddened/irritated stop using the CHG.  Do not shave (including legs and underarms) for at least 48 hours prior to first CHG shower. It is OK to shave your face.  Please follow these instructions carefully.   1. Shower the NIGHT BEFORE SURGERY and the MORNING OF SURGERY with CHG.   2. If you chose to wash your hair, wash your hair first as usual with your normal shampoo.  3. After you shampoo, rinse your hair and body thoroughly to remove the shampoo.  4. Use CHG as you would any other liquid soap. You can apply CHG directly to the skin and wash gently with a scrungie or a clean washcloth.   5. Apply the CHG Soap to your body ONLY FROM THE NECK DOWN.  Do not use on open wounds or open sores. Avoid contact with your eyes, ears, mouth and genitals (private parts). Wash Face  and genitals (private parts)  with your normal soap.  6. Wash thoroughly, paying special attention to the area where your surgery will be performed.  7. Thoroughly rinse your body with warm water from the neck down.  8. DO NOT shower/wash with your normal soap after using and rinsing off the CHG Soap.  9. Pat yourself dry with a CLEAN TOWEL.  10. Wear CLEAN PAJAMAS to bed the night before surgery, wear comfortable clothes the morning of surgery  11. Place CLEAN SHEETS on your bed the night of your first shower and DO NOT SLEEP WITH PETS.  Reminders: Do not apply any deodorants/lotions.  Please wear clean clothes to the hospital/surgery center.   Remember to brush your teeth WITH YOUR REGULAR TOOTHPASTE.  Please read over the following fact sheets that you were given.

## 2020-12-23 ENCOUNTER — Other Ambulatory Visit: Payer: Self-pay

## 2020-12-23 ENCOUNTER — Encounter (HOSPITAL_COMMUNITY)
Admission: RE | Admit: 2020-12-23 | Discharge: 2020-12-23 | Disposition: A | Discharge status: Home / Self Care | Payer: Medicare Other | Source: Ambulatory Visit | Attending: Orthopaedic Surgery | Admitting: Orthopaedic Surgery

## 2020-12-23 ENCOUNTER — Other Ambulatory Visit: Payer: Self-pay | Admitting: Physician Assistant

## 2020-12-23 ENCOUNTER — Ambulatory Visit (HOSPITAL_COMMUNITY)
Admission: RE | Admit: 2020-12-23 | Discharge: 2020-12-23 | Disposition: A | Discharge status: Home / Self Care | Payer: Medicare Other | Source: Ambulatory Visit | Attending: Physician Assistant | Admitting: Physician Assistant

## 2020-12-23 ENCOUNTER — Encounter (HOSPITAL_COMMUNITY): Payer: Self-pay

## 2020-12-23 DIAGNOSIS — M1712 Unilateral primary osteoarthritis, left knee: Secondary | ICD-10-CM

## 2020-12-23 DIAGNOSIS — Z20822 Contact with and (suspected) exposure to covid-19: Secondary | ICD-10-CM | POA: Insufficient documentation

## 2020-12-23 DIAGNOSIS — Z87891 Personal history of nicotine dependence: Secondary | ICD-10-CM | POA: Insufficient documentation

## 2020-12-23 DIAGNOSIS — Z01818 Encounter for other preprocedural examination: Secondary | ICD-10-CM | POA: Insufficient documentation

## 2020-12-23 DIAGNOSIS — I700 Atherosclerosis of aorta: Secondary | ICD-10-CM | POA: Insufficient documentation

## 2020-12-23 HISTORY — DX: Nausea with vomiting, unspecified: R11.2

## 2020-12-23 HISTORY — DX: Other specified postprocedural states: Z98.890

## 2020-12-23 LAB — SURGICAL PCR SCREEN
MRSA, PCR: NEGATIVE
Staphylococcus aureus: NEGATIVE

## 2020-12-23 LAB — CBC WITH DIFFERENTIAL/PLATELET
Abs Immature Granulocytes: 0.01 10*3/uL (ref 0.00–0.07)
Basophils Absolute: 0.1 10*3/uL (ref 0.0–0.1)
Basophils Relative: 1 %
Eosinophils Absolute: 0.2 10*3/uL (ref 0.0–0.5)
Eosinophils Relative: 4 %
HCT: 39.8 % (ref 36.0–46.0)
Hemoglobin: 13.9 g/dL (ref 12.0–15.0)
Immature Granulocytes: 0 %
Lymphocytes Relative: 42 %
Lymphs Abs: 2.4 10*3/uL (ref 0.7–4.0)
MCH: 31.6 pg (ref 26.0–34.0)
MCHC: 34.9 g/dL (ref 30.0–36.0)
MCV: 90.5 fL (ref 80.0–100.0)
Monocytes Absolute: 0.8 10*3/uL (ref 0.1–1.0)
Monocytes Relative: 13 %
Neutro Abs: 2.3 10*3/uL (ref 1.7–7.7)
Neutrophils Relative %: 40 %
Platelets: 326 10*3/uL (ref 150–400)
RBC: 4.4 MIL/uL (ref 3.87–5.11)
RDW: 13.1 % (ref 11.5–15.5)
WBC: 5.8 10*3/uL (ref 4.0–10.5)
nRBC: 0 % (ref 0.0–0.2)

## 2020-12-23 LAB — SARS CORONAVIRUS 2 (TAT 6-24 HRS): SARS Coronavirus 2: NEGATIVE

## 2020-12-23 LAB — URINALYSIS, ROUTINE W REFLEX MICROSCOPIC
Bilirubin Urine: NEGATIVE
Glucose, UA: NEGATIVE mg/dL
Hgb urine dipstick: NEGATIVE
Ketones, ur: NEGATIVE mg/dL
Nitrite: NEGATIVE
Protein, ur: NEGATIVE mg/dL
Specific Gravity, Urine: 1.008 (ref 1.005–1.030)
pH: 6 (ref 5.0–8.0)

## 2020-12-23 LAB — COMPREHENSIVE METABOLIC PANEL
ALT: 25 U/L (ref 0–44)
AST: 36 U/L (ref 15–41)
Albumin: 4.3 g/dL (ref 3.5–5.0)
Alkaline Phosphatase: 75 U/L (ref 38–126)
Anion gap: 8 (ref 5–15)
BUN: 16 mg/dL (ref 8–23)
CO2: 28 mmol/L (ref 22–32)
Calcium: 9.4 mg/dL (ref 8.9–10.3)
Chloride: 101 mmol/L (ref 98–111)
Creatinine, Ser: 0.76 mg/dL (ref 0.44–1.00)
GFR, Estimated: >60 mL/min (ref >60)
Glucose, Bld: 105 mg/dL — ABNORMAL HIGH (ref 70–99)
Potassium: 3.5 mmol/L (ref 3.5–5.1)
Sodium: 137 mmol/L (ref 135–145)
Total Bilirubin: 0.7 mg/dL (ref 0.3–1.2)
Total Protein: 7.8 g/dL (ref 6.5–8.1)

## 2020-12-23 LAB — TYPE AND SCREEN
ABO/RH(D): O POS
Antibody Screen: NEGATIVE

## 2020-12-23 LAB — PROTIME-INR
INR: 1.1 (ref 0.8–1.2)
Prothrombin Time: 13.7 seconds (ref 11.4–15.2)

## 2020-12-23 LAB — GLUCOSE, CAPILLARY: Glucose-Capillary: 119 mg/dL — ABNORMAL HIGH (ref 70–99)

## 2020-12-23 LAB — APTT: aPTT: 33 seconds (ref 24–36)

## 2020-12-23 MED ORDER — CIPROFLOXACIN HCL 250 MG PO TABS: 250.0000 mg | ORAL_TABLET | Freq: Two times a day (BID) | ORAL | 0 refills | Status: AC

## 2020-12-23 NOTE — Progress Notes (Signed)
PCP - Dr. Daine Gip Simmons-Robinson Cardiologist - Dr. Eloy End  Chest x-ray - 12/23/20 EKG - 12/23/20 Stress Test - 2002 ECHO - 06/29/19 Cardiac Cath - 2001-1 stent  Sleep Study - denies CPAP - denies  Pt does not check CBG at home. Currently has a new meter and needs more supplies. CBG at PAT 119 Last A1C was on 11/19/20: 6.1  Blood Thinner Instructions:n/a Aspirin Instructions: stop as of today, 12/23/20.  ERAS Protcol - will stop clears by 0635 DOS PRE-SURGERY Ensure or G2- pt provided with (1) 10oz bottle of water.  COVID TEST- Done in PAT; 12/23/20.   Anesthesia review: Yes, heart history.  Patient denies shortness of breath, fever, cough and chest pain at PAT appointment   All instructions explained to the patient, with a verbal understanding of the material. Patient agrees to go over the instructions while at home for a better understanding. Patient also instructed to self quarantine after being tested for COVID-19. The opportunity to ask questions was provided.

## 2020-12-23 NOTE — Progress Notes (Signed)
Will you let her know that it looks like she has a UTI and I have called in cipro abx to start asap.  The bactrim I called in is for after surgery

## 2020-12-24 MED ORDER — TRANEXAMIC ACID 1000 MG/10ML IV SOLN
2000.0000 mg | INTRAVENOUS | Status: DC
  Filled 2020-12-24 (×2): qty 20

## 2020-12-24 NOTE — Anesthesia Preprocedure Evaluation (Addendum)
Anesthesia Evaluation  Patient identified by MRN, date of birth, ID band Patient awake    Reviewed: Allergy & Precautions, NPO status , Patient's Chart, lab work & pertinent test results  History of Anesthesia Complications (+) PONV and history of anesthetic complications  Airway Mallampati: II  TM Distance: >3 FB Neck ROM: Full    Dental  (+) Edentulous Upper, Edentulous Lower, Dental Advisory Given   Pulmonary former smoker,  Covid-19 Nucleic Acid Test Results Lab Results      Component                Value               Date                      [place]              NEGATIVE            12/23/2020                Power              NEGATIVE            02/19/2020              breath sounds clear to auscultation       Cardiovascular hypertension, Pt. on medications + CAD, + Past MI and + Cardiac Stents   Rhythm:Regular  Left Ventricle: Left ventricular ejection fraction, by visual estimation,  is 60 to 65%. The left ventricle has normal function. There is mildly  increased left ventricular hypertrophy. Normal left ventricular size.  Spectral Doppler shows Left ventricular  diastolic Doppler parameters are consistent with impaired relaxation  pattern of LV diastolic filling. Global longitudinal strain= -23%.   Right Ventricle: The right ventricular size is normal. No increase in  right ventricular wall thickness. Global RV systolic function is has  normal systolic function. The tricuspid regurgitant velocity is 2.42 m/s,  and with an assumed right atrial pressure  of 3 mmHg, the estimated right ventricular systolic pressure is normal at  26.4 mmHg.   Left Atrium: Left atrial size was normal in size.   Right Atrium: Right atrial size was mildly dilated   Pericardium: There is no evidence of pericardial effusion.   Mitral Valve: The mitral valve is abnormal. There is mild thickening of  the mitral valve  leaflet(s). Trace mitral valve regurgitation.   Tricuspid Valve: The tricuspid valve is normal in structure. Tricuspid  valve regurgitation is trivial by color flow Doppler.   Aortic Valve: The aortic valve is tricuspid. Aortic valve regurgitation  was not visualized by color flow Doppler. Mild to moderate aortic valve  sclerosis/calcification is present, without any evidence of aortic  stenosis.   Pulmonic Valve: The pulmonic valve was normal in structure. Pulmonic valve  regurgitation is trivial by color flow Doppler.   Aorta: The aortic root is normal in size and structure.   IAS/Shunts: No atrial level shunt detected by color flow Doppler.    Neuro/Psych PSYCHIATRIC DISORDERS Depression negative neurological ROS     GI/Hepatic negative GI ROS, Neg liver ROS,   Endo/Other  diabetesLab Results      Component                Value               Date  HGBA1C                   6.1 (A)             11/19/2020             Renal/GU negative Renal ROSLab Results      Component                Value               Date                      CREATININE               0.76                12/23/2020                Musculoskeletal  (+) Arthritis ,   Abdominal   Peds  Hematology  (+) Sickle cell trait , Lab Results      Component                Value               Date                      WBC                      5.8                 12/23/2020                HGB                      13.9                12/23/2020                HCT                      39.8                12/23/2020                MCV                      90.5                12/23/2020                PLT                      326                 12/23/2020            inr 1.1 ptt 33   Anesthesia Other Findings History includes former smoker, CAD(inferior MI, s/p PTCA/long BX velocity stent mid/distal RCA 08/30/00), sinus bradycardia(improved after metoprolol discontinued), DM2, HLD, HTN,  sickle cell trait, diverticulosis. Mild pulmonary hypertension is listed in history due to PA peak pressure of 37 mmHg on 01/20/16 echo, but RVSP was normal at 26.4 mmHg on 07/07/19 echo.  Underwent right TKA 3/81/7711 without complication.  Prior to that surgery she was cleared by cardiology.  Seen by cardiology 07/07/2020 for routine followup and noted to be doing well at that  time, walking 1 mile per day status post recent knee replacement.  Zetia was added for control of hyperlipidemia.  No other changes made management.  Advised to follow-up in 1 year.  Diet-controlled DM2, last A1c 6.1 on 11/19/2020.  Preop labs reviewed, unremarkable.  EKG 12/23/2020: NSR with sinus arrhythmia.  Rate 63.  Possible LAE.  Inferior infarct, age undetermined.  Echo 07/07/19: IMPRESSIONS  1. Left ventricular ejection fraction, by visual estimation, is 60 to  65%. The left ventricle has normal function. Normal left ventricular size.  There is mildly increased left ventricular hypertrophy.  2. Left ventricular diastolic Doppler parameters are consistent with  impaired relaxation pattern of LV diastolic filling.  3. Global longitudinal strain= -23%.  4. Global right ventricle has normal systolic function.The right  ventricular size is normal. No increase in right ventricular wall  thickness. The estimated right ventricular systolic pressure is normal at  26.4 mmHg.   Nuclear stress test 10/29/00: IMPRESSION 1. NO EVIDENCE OF STRESS INDUCED MYOCARDIAL ISCHEMIA. INFERIOR WALL MYOCARDIAL SCAR. 2. CALCULATED LEFT VENTRICULAR EJECTION FRACTION OF 55%.  Cardiac cath 08/30/00: 1. Left main coronary artery: The left main coronary artery was patent. 2. Left anterior descending coronary artery: The LAD had 20%-30% midstenosis. The diagonal-I had mild disease. 3. Left circumflex coronary artery: The left circumflex had 60%-70%midstenosis. The OM-I has 80%-85% ostial stenosis. The OM-II is  patentwhich is medium-sized. The OM-III and OM-IV are very small. 4. Right coronary artery: The right coronary artery was 100% occluded beyond the mid and distal junction. 5.Temporary transvenous pacemaker insertion via right femoral approach. ZOX:[id number] percutaneous transluminal coronary angioplasty to the mid and distal junctional right coronary artery using 3.5 mm x 1.5 mm longCrossSail balloon. Successful deployment of a 3.5 mm x 18.0 mm long BX velocity stentin the mid and distal junction of the right coronary artery.   Reproductive/Obstetrics                          Anesthesia Physical Anesthesia Plan  ASA: II  Anesthesia Plan: MAC, Spinal and Regional   Post-op Pain Management:  Regional for Post-op pain   Induction:   PONV Risk Score and Plan: 3 and Treatment may vary due to age or medical condition  Airway Management Planned: Nasal Cannula  Additional Equipment: None  Intra-op Plan:   Post-operative Plan: Extubation in OR  Informed Consent: I have reviewed the patients History and Physical, chart, labs and discussed the procedure including the risks, benefits and alternatives for the proposed anesthesia with the patient or authorized representative who has indicated his/her understanding and acceptance.     Dental advisory given  Plan Discussed with: CRNA and Surgeon  Anesthesia Plan Comments: (PAT note by Karoline Caldwell, PA-C: History includes former smoker, CAD (inferior MI, s/p PTCA/long BX velocity stent mid/distal RCA 08/30/00), sinus bradycardia (improved after metoprolol discontinued), DM2, HLD, HTN, sickle cell trait, diverticulosis. Mild pulmonary hypertension is listed in history due to PA peak pressure of 37 mmHg on 01/20/16 echo, but RVSP was normal at 26.4 mmHg on 07/07/19 echo.    Underwent right TKA 10/27/1476 without complication.  Prior to that surgery she was cleared by cardiology.  Seen by cardiology 07/07/2020 for routine  followup and noted to be doing well at that time, walking 1 mile per day status post recent knee replacement.  Zetia was added for control of hyperlipidemia.  No other changes made management.  Advised to follow-up in 1 year.  Diet-controlled  DM2, last A1c 6.1 on 11/19/2020.  Preop labs reviewed, unremarkable.  EKG 12/23/2020: NSR with sinus arrhythmia.  Rate 63.  Possible LAE.  Inferior infarct, age undetermined.  Echo 07/07/19: IMPRESSIONS  1. Left ventricular ejection fraction, by visual estimation, is 60 to  65%. The left ventricle has normal function. Normal left ventricular size.  There is mildly increased left ventricular hypertrophy.  2. Left ventricular diastolic Doppler parameters are consistent with  impaired relaxation pattern of LV diastolic filling.  3. Global longitudinal strain= -23%.  4. Global right ventricle has normal systolic function.The right  ventricular size is normal. No increase in right ventricular wall  thickness. The estimated right ventricular systolic pressure is normal at  26.4 mmHg.   Nuclear stress test 10/29/00: IMPRESSION 1. NO EVIDENCE OF STRESS INDUCED MYOCARDIAL ISCHEMIA. INFERIOR WALL MYOCARDIAL SCAR. 2. CALCULATED LEFT VENTRICULAR EJECTION FRACTION OF 55%.  Cardiac cath 08/30/00: 1. Left main coronary artery: The left main coronary artery was patent. 2. Left anterior descending coronary artery: The LAD had 20%-30% midstenosis. The diagonal-I had mild disease. 3. Left circumflex coronary artery: The left circumflex had 60%-70%midstenosis. The OM-I has 80%-85% ostial stenosis. The OM-II is patentwhich is medium-sized. The OM-III and OM-IV are very small. 4. Right coronary artery: The right coronary artery was 100% occluded beyond the mid and distal junction. 5. Temporary transvenous pacemaker insertion via right femoral approach. PCI: Successful percutaneous transluminal coronary angioplasty to the mid and distal junctional right  coronary artery using 3.5 mm x 1.5 mm longCrossSail balloon. Successful deployment of a 3.5 mm x 18.0 mm long BX velocity stentin the mid and distal junction of the right coronary artery.   )       Anesthesia Quick Evaluation

## 2020-12-24 NOTE — Progress Notes (Signed)
Anesthesia Chart Review:  History includes former smoker, CAD (inferior MI, s/p PTCA/long BX velocity stent mid/distal RCA 08/30/00), sinus bradycardia (improved after metoprolol discontinued), DM2, HLD, HTN, sickle cell trait, diverticulosis. Mild pulmonary hypertension is listed in history due to PA peak pressure of 37 mmHg on 01/20/16 echo, but RVSP was normal at 26.4 mmHg on 07/07/19 echo.    Underwent right TKA 02/23/2020 without complication.  Prior to that surgery she was cleared by cardiology.  Seen by cardiology 07/07/2020 for routine followup and noted to be doing well at that time, walking 1 mile per day status post recent knee replacement.  Zetia was added for control of hyperlipidemia.  No other changes made management.  Advised to follow-up in 1 year.  Diet-controlled DM2, last A1c 6.1 on 11/19/2020.  Preop labs reviewed, unremarkable.  EKG 12/23/2020: NSR with sinus arrhythmia.  Rate 63.  Possible LAE.  Inferior infarct, age undetermined.  Echo 07/07/19: IMPRESSIONS  1. Left ventricular ejection fraction, by visual estimation, is 60 to  65%. The left ventricle has normal function. Normal left ventricular size.  There is mildly increased left ventricular hypertrophy.  2. Left ventricular diastolic Doppler parameters are consistent with  impaired relaxation pattern of LV diastolic filling.  3. Global longitudinal strain= -23%.  4. Global right ventricle has normal systolic function.The right  ventricular size is normal. No increase in right ventricular wall  thickness. The estimated right ventricular systolic pressure is normal at  26.4 mmHg.   Nuclear stress test 10/29/00: IMPRESSION 1. NO EVIDENCE OF STRESS INDUCED MYOCARDIAL ISCHEMIA. INFERIOR WALL MYOCARDIAL SCAR. 2. CALCULATED LEFT VENTRICULAR EJECTION FRACTION OF 55%.  Cardiac cath 08/30/00: 1. Left main coronary artery: The left main coronary artery was patent. 2. Left anterior descending coronary artery: The  LAD had 20%-30% midstenosis. The diagonal-I had mild disease. 3. Left circumflex coronary artery: The left circumflex had 60%-70%midstenosis. The OM-I has 80%-85% ostial stenosis. The OM-II is patentwhich is medium-sized. The OM-III and OM-IV are very small. 4. Right coronary artery: The right coronary artery was 100% occluded beyond the mid and distal junction. 5. Temporary transvenous pacemaker insertion via right femoral approach. PCI: Successful percutaneous transluminal coronary angioplasty to the mid and distal junctional right coronary artery using 3.5 mm x 1.5 mm longCrossSail balloon. Successful deployment of a 3.5 mm x 18.0 mm long BX velocity stentin the mid and distal junction of the right coronary artery.    Terri Estrada [facility] Short Stay Center/Anesthesiology Phone [phone] 12/24/2020 10:40 AM

## 2020-12-26 DIAGNOSIS — M1712 Unilateral primary osteoarthritis, left knee: Secondary | ICD-10-CM

## 2020-12-27 ENCOUNTER — Ambulatory Visit (HOSPITAL_COMMUNITY): Payer: Medicare Other | Admitting: Physician Assistant

## 2020-12-27 ENCOUNTER — Observation Stay (HOSPITAL_COMMUNITY)
Admission: RE | Admit: 2020-12-27 | Discharge: 2020-12-28 | Disposition: A | Discharge status: Home / Self Care | Payer: Medicare Other | Attending: Orthopaedic Surgery | Admitting: Orthopaedic Surgery

## 2020-12-27 ENCOUNTER — Observation Stay (HOSPITAL_COMMUNITY): Payer: Medicare Other

## 2020-12-27 ENCOUNTER — Other Ambulatory Visit: Payer: Self-pay

## 2020-12-27 ENCOUNTER — Encounter (HOSPITAL_COMMUNITY): Payer: Self-pay | Admitting: Orthopaedic Surgery

## 2020-12-27 ENCOUNTER — Ambulatory Visit (HOSPITAL_COMMUNITY): Payer: Medicare Other | Admitting: Anesthesiology

## 2020-12-27 ENCOUNTER — Encounter (HOSPITAL_COMMUNITY)
Admission: RE | Disposition: A | Discharge status: Home / Self Care | Payer: Self-pay | Source: Home / Self Care | Attending: Orthopaedic Surgery

## 2020-12-27 DIAGNOSIS — I2510 Atherosclerotic heart disease of native coronary artery without angina pectoris: Secondary | ICD-10-CM | POA: Insufficient documentation

## 2020-12-27 DIAGNOSIS — M25762 Osteophyte, left knee: Secondary | ICD-10-CM | POA: Insufficient documentation

## 2020-12-27 DIAGNOSIS — Z87891 Personal history of nicotine dependence: Secondary | ICD-10-CM | POA: Insufficient documentation

## 2020-12-27 DIAGNOSIS — Z96651 Presence of right artificial knee joint: Secondary | ICD-10-CM | POA: Insufficient documentation

## 2020-12-27 DIAGNOSIS — Z79899 Other long term (current) drug therapy: Secondary | ICD-10-CM | POA: Diagnosis not present

## 2020-12-27 DIAGNOSIS — I10 Essential (primary) hypertension: Secondary | ICD-10-CM | POA: Diagnosis not present

## 2020-12-27 DIAGNOSIS — M1712 Unilateral primary osteoarthritis, left knee: Secondary | ICD-10-CM | POA: Diagnosis present

## 2020-12-27 DIAGNOSIS — Z96652 Presence of left artificial knee joint: Secondary | ICD-10-CM

## 2020-12-27 DIAGNOSIS — E119 Type 2 diabetes mellitus without complications: Secondary | ICD-10-CM | POA: Diagnosis not present

## 2020-12-27 DIAGNOSIS — R52 Pain, unspecified: Secondary | ICD-10-CM

## 2020-12-27 DIAGNOSIS — Z7982 Long term (current) use of aspirin: Secondary | ICD-10-CM | POA: Insufficient documentation

## 2020-12-27 HISTORY — PX: TOTAL KNEE ARTHROPLASTY: SHX125

## 2020-12-27 LAB — GLUCOSE, CAPILLARY
Glucose-Capillary: 112 mg/dL — ABNORMAL HIGH (ref 70–99)
Glucose-Capillary: 100 mg/dL — ABNORMAL HIGH (ref 70–99)
Glucose-Capillary: 153 mg/dL — ABNORMAL HIGH (ref 70–99)

## 2020-12-27 SURGERY — ARTHROPLASTY, KNEE, TOTAL
Anesthesia: Monitor Anesthesia Care | Site: Knee | Laterality: Left

## 2020-12-27 MED ORDER — MIDAZOLAM HCL 5 MG/5ML IJ SOLN
INTRAMUSCULAR | Status: DC | PRN
  Administered 2020-12-27: 2 mg via INTRAVENOUS

## 2020-12-27 MED ORDER — PROPOFOL 10 MG/ML IV BOLUS
INTRAVENOUS | Status: DC | PRN
  Administered 2020-12-27: 20 mg via INTRAVENOUS

## 2020-12-27 MED ORDER — SORBITOL 70 % SOLN
30.0000 mL | Freq: Every day | Status: DC | PRN
  Filled 2020-12-27: qty 30

## 2020-12-27 MED ORDER — HYDROMORPHONE HCL 1 MG/ML IJ SOLN: 0.5000 mg | INTRAMUSCULAR | Status: DC | PRN

## 2020-12-27 MED ORDER — ACETAMINOPHEN 325 MG PO TABS: 325.0000 mg | ORAL_TABLET | Freq: Four times a day (QID) | ORAL | Status: DC | PRN

## 2020-12-27 MED ORDER — ONDANSETRON HCL 4 MG/2ML IJ SOLN
INTRAMUSCULAR | Status: AC
  Filled 2020-12-27: qty 2

## 2020-12-27 MED ORDER — ISOSORBIDE MONONITRATE ER 30 MG PO TB24
30.0000 mg | ORAL_TABLET | Freq: Every day | ORAL | Status: DC
  Administered 2020-12-28: 30 mg via ORAL
  Filled 2020-12-27 (×2): qty 1

## 2020-12-27 MED ORDER — FENTANYL CITRATE (PF) 100 MCG/2ML IJ SOLN
INTRAMUSCULAR | Status: DC | PRN
  Administered 2020-12-27: 50 ug via INTRAVENOUS

## 2020-12-27 MED ORDER — BUPIVACAINE IN DEXTROSE 0.75-8.25 % IT SOLN
INTRATHECAL | Status: DC | PRN
  Administered 2020-12-27: 1.8 mL via INTRATHECAL

## 2020-12-27 MED ORDER — ONDANSETRON HCL 4 MG PO TABS: 4.0000 mg | ORAL_TABLET | Freq: Four times a day (QID) | ORAL | Status: DC | PRN

## 2020-12-27 MED ORDER — METOCLOPRAMIDE HCL 5 MG/ML IJ SOLN
5.0000 mg | Freq: Three times a day (TID) | INTRAMUSCULAR | Status: DC | PRN
Start: 2020-12-27 — End: 2020-12-28

## 2020-12-27 MED ORDER — ONDANSETRON HCL 4 MG/2ML IJ SOLN
INTRAMUSCULAR | Status: DC | PRN
  Administered 2020-12-27: 4 mg via INTRAVENOUS

## 2020-12-27 MED ORDER — FENTANYL CITRATE (PF) 250 MCG/5ML IJ SOLN
INTRAMUSCULAR | Status: AC
  Filled 2020-12-27: qty 5

## 2020-12-27 MED ORDER — VANCOMYCIN HCL 1000 MG IV SOLR
INTRAVENOUS | Status: AC
  Filled 2020-12-27: qty 1000

## 2020-12-27 MED ORDER — IRRISEPT - 450ML BOTTLE WITH 0.05% CHG IN STERILE WATER, USP 99.95% OPTIME
TOPICAL | Status: DC | PRN
  Administered 2020-12-27: 450 mL via TOPICAL

## 2020-12-27 MED ORDER — LACTATED RINGERS IV SOLN: INTRAVENOUS | Status: DC

## 2020-12-27 MED ORDER — POLYETHYLENE GLYCOL 3350 17 G PO PACK
17.0000 g | PACK | Freq: Every day | ORAL | Status: DC
  Administered 2020-12-27: 17 g via ORAL
  Filled 2020-12-27 (×2): qty 1

## 2020-12-27 MED ORDER — ACETAMINOPHEN 10 MG/ML IV SOLN: 1000.0000 mg | Freq: Once | INTRAVENOUS | Status: DC | PRN

## 2020-12-27 MED ORDER — OXYCODONE HCL 5 MG PO TABS: 10.0000 mg | ORAL_TABLET | ORAL | Status: DC | PRN

## 2020-12-27 MED ORDER — METOCLOPRAMIDE HCL 5 MG PO TABS: 5.0000 mg | ORAL_TABLET | Freq: Three times a day (TID) | ORAL | Status: DC | PRN

## 2020-12-27 MED ORDER — METHOCARBAMOL 500 MG PO TABS
500.0000 mg | ORAL_TABLET | Freq: Four times a day (QID) | ORAL | Status: DC | PRN
  Administered 2020-12-28: 500 mg via ORAL
  Filled 2020-12-27: qty 1

## 2020-12-27 MED ORDER — CEFAZOLIN SODIUM-DEXTROSE 2-4 GM/100ML-% IV SOLN
2.0000 g | Freq: Four times a day (QID) | INTRAVENOUS | Status: AC
  Administered 2020-12-27 (×2): 2 g via INTRAVENOUS
  Filled 2020-12-27 (×2): qty 100

## 2020-12-27 MED ORDER — PROPOFOL 500 MG/50ML IV EMUL
INTRAVENOUS | Status: DC | PRN
  Administered 2020-12-27: 75 ug/kg/min via INTRAVENOUS

## 2020-12-27 MED ORDER — 0.9 % SODIUM CHLORIDE (POUR BTL) OPTIME
TOPICAL | Status: DC | PRN
  Administered 2020-12-27: 1000 mL

## 2020-12-27 MED ORDER — MIDAZOLAM HCL 2 MG/2ML IJ SOLN
INTRAMUSCULAR | Status: AC
  Filled 2020-12-27: qty 2

## 2020-12-27 MED ORDER — CHLORHEXIDINE GLUCONATE 0.12 % MT SOLN
OROMUCOSAL | Status: AC
  Administered 2020-12-27: 15 mL
  Filled 2020-12-27: qty 15

## 2020-12-27 MED ORDER — ONDANSETRON HCL 4 MG/2ML IJ SOLN: 4.0000 mg | Freq: Four times a day (QID) | INTRAMUSCULAR | Status: DC | PRN

## 2020-12-27 MED ORDER — DOCUSATE SODIUM 100 MG PO CAPS
100.0000 mg | ORAL_CAPSULE | Freq: Two times a day (BID) | ORAL | Status: DC
  Administered 2020-12-27 – 2020-12-28 (×3): 100 mg via ORAL
  Filled 2020-12-27 (×3): qty 1

## 2020-12-27 MED ORDER — METHOCARBAMOL 1000 MG/10ML IJ SOLN
500.0000 mg | Freq: Four times a day (QID) | INTRAVENOUS | Status: DC | PRN
  Filled 2020-12-27: qty 5

## 2020-12-27 MED ORDER — PHENYLEPHRINE 40 MCG/ML (10ML) SYRINGE FOR IV PUSH (FOR BLOOD PRESSURE SUPPORT)
PREFILLED_SYRINGE | INTRAVENOUS | Status: DC | PRN
  Administered 2020-12-27: 80 ug via INTRAVENOUS

## 2020-12-27 MED ORDER — SODIUM CHLORIDE 0.9 % IV SOLN: INTRAVENOUS | Status: DC

## 2020-12-27 MED ORDER — BUPIVACAINE-MELOXICAM ER 200-6 MG/7ML IJ SOLN
INTRAMUSCULAR | Status: AC
  Filled 2020-12-27: qty 2

## 2020-12-27 MED ORDER — PROPOFOL 10 MG/ML IV BOLUS
INTRAVENOUS | Status: AC
  Filled 2020-12-27: qty 20

## 2020-12-27 MED ORDER — LOSARTAN POTASSIUM 50 MG PO TABS
25.0000 mg | ORAL_TABLET | Freq: Every day | ORAL | Status: DC
  Administered 2020-12-27 – 2020-12-28 (×2): 25 mg via ORAL
  Filled 2020-12-27 (×2): qty 1

## 2020-12-27 MED ORDER — CIPROFLOXACIN HCL 500 MG PO TABS
250.0000 mg | ORAL_TABLET | Freq: Two times a day (BID) | ORAL | Status: DC
  Administered 2020-12-27 – 2020-12-28 (×2): 250 mg via ORAL
  Filled 2020-12-27 (×2): qty 1

## 2020-12-27 MED ORDER — LACTATED RINGERS IV SOLN: INTRAVENOUS | Status: DC | PRN

## 2020-12-27 MED ORDER — OXYCODONE HCL 5 MG PO TABS
5.0000 mg | ORAL_TABLET | ORAL | Status: DC | PRN
  Administered 2020-12-27 – 2020-12-28 (×3): 5 mg via ORAL
  Filled 2020-12-27: qty 1
  Filled 2020-12-27: qty 2
  Filled 2020-12-27: qty 1

## 2020-12-27 MED ORDER — MAGNESIUM CITRATE PO SOLN: 1.0000 | Freq: Once | ORAL | Status: DC | PRN

## 2020-12-27 MED ORDER — DEXAMETHASONE SODIUM PHOSPHATE 10 MG/ML IJ SOLN
INTRAMUSCULAR | Status: AC
  Filled 2020-12-27: qty 1

## 2020-12-27 MED ORDER — PHENOL 1.4 % MT LIQD: 1.0000 | OROMUCOSAL | Status: DC | PRN

## 2020-12-27 MED ORDER — ACETAMINOPHEN 500 MG PO TABS
1000.0000 mg | ORAL_TABLET | Freq: Four times a day (QID) | ORAL | Status: AC
  Administered 2020-12-27 – 2020-12-28 (×4): 1000 mg via ORAL
  Filled 2020-12-27 (×4): qty 2

## 2020-12-27 MED ORDER — OXYCODONE HCL 5 MG PO TABS: 5.0000 mg | ORAL_TABLET | Freq: Once | ORAL | Status: DC | PRN

## 2020-12-27 MED ORDER — MENTHOL 3 MG MT LOZG: 1.0000 | LOZENGE | OROMUCOSAL | Status: DC | PRN

## 2020-12-27 MED ORDER — AMLODIPINE BESYLATE 10 MG PO TABS
10.0000 mg | ORAL_TABLET | Freq: Every day | ORAL | Status: DC
  Administered 2020-12-28: 10 mg via ORAL
  Filled 2020-12-27: qty 1

## 2020-12-27 MED ORDER — SODIUM CHLORIDE 0.9 % IR SOLN
Status: DC | PRN
  Administered 2020-12-27: 3000 mL

## 2020-12-27 MED ORDER — CEFAZOLIN SODIUM-DEXTROSE 2-4 GM/100ML-% IV SOLN
2.0000 g | INTRAVENOUS | Status: AC
  Administered 2020-12-27: 2 g via INTRAVENOUS
  Filled 2020-12-27: qty 100

## 2020-12-27 MED ORDER — ALUM & MAG HYDROXIDE-SIMETH 200-200-20 MG/5ML PO SUSP: 30.0000 mL | ORAL | Status: DC | PRN

## 2020-12-27 MED ORDER — ACETAMINOPHEN 160 MG/5ML PO SOLN: 1000.0000 mg | Freq: Once | ORAL | Status: DC | PRN

## 2020-12-27 MED ORDER — BUPIVACAINE LIPOSOME 1.3 % IJ SUSP
20.0000 mL | Freq: Once | INTRAMUSCULAR | Status: DC
  Filled 2020-12-27: qty 20

## 2020-12-27 MED ORDER — TRANEXAMIC ACID-NACL 1000-0.7 MG/100ML-% IV SOLN
1000.0000 mg | Freq: Once | INTRAVENOUS | Status: AC
  Administered 2020-12-27: 1000 mg via INTRAVENOUS
  Filled 2020-12-27: qty 100

## 2020-12-27 MED ORDER — TRANEXAMIC ACID-NACL 1000-0.7 MG/100ML-% IV SOLN
1000.0000 mg | INTRAVENOUS | Status: AC
  Administered 2020-12-27: 1000 mg via INTRAVENOUS
  Filled 2020-12-27: qty 100

## 2020-12-27 MED ORDER — BUPIVACAINE-MELOXICAM ER 400-12 MG/14ML IJ SOLN
INTRAMUSCULAR | Status: DC | PRN
  Administered 2020-12-27: 400 mg

## 2020-12-27 MED ORDER — ACETAMINOPHEN 500 MG PO TABS: 1000.0000 mg | ORAL_TABLET | Freq: Once | ORAL | Status: DC | PRN

## 2020-12-27 MED ORDER — ROPIVACAINE HCL 7.5 MG/ML IJ SOLN
INTRAMUSCULAR | Status: DC | PRN
  Administered 2020-12-27: 20 mL via PERINEURAL

## 2020-12-27 MED ORDER — TRANEXAMIC ACID 1000 MG/10ML IV SOLN
INTRAVENOUS | Status: DC | PRN
  Administered 2020-12-27: 2000 mg via TOPICAL

## 2020-12-27 MED ORDER — FENTANYL CITRATE (PF) 100 MCG/2ML IJ SOLN: 25.0000 ug | INTRAMUSCULAR | Status: DC | PRN

## 2020-12-27 MED ORDER — VANCOMYCIN HCL 1000 MG IV SOLR
INTRAVENOUS | Status: DC | PRN
  Administered 2020-12-27: 1000 mg via TOPICAL

## 2020-12-27 MED ORDER — DIPHENHYDRAMINE HCL 12.5 MG/5ML PO ELIX: 25.0000 mg | ORAL_SOLUTION | ORAL | Status: DC | PRN

## 2020-12-27 MED ORDER — POVIDONE-IODINE 10 % EX SWAB: 2.0000 "application " | Freq: Once | CUTANEOUS | Status: DC

## 2020-12-27 MED ORDER — LIDOCAINE 2% (20 MG/ML) 5 ML SYRINGE
INTRAMUSCULAR | Status: AC
  Filled 2020-12-27: qty 5

## 2020-12-27 MED ORDER — OXYCODONE HCL 5 MG/5ML PO SOLN: 5.0000 mg | Freq: Once | ORAL | Status: DC | PRN

## 2020-12-27 MED ORDER — PANTOPRAZOLE SODIUM 40 MG PO TBEC
40.0000 mg | DELAYED_RELEASE_TABLET | Freq: Every day | ORAL | Status: DC
  Administered 2020-12-27 – 2020-12-28 (×2): 40 mg via ORAL
  Filled 2020-12-27: qty 1

## 2020-12-27 MED ORDER — INSULIN ASPART 100 UNIT/ML ~~LOC~~ SOLN: 0.0000 [IU] | Freq: Every day | SUBCUTANEOUS | Status: DC

## 2020-12-27 MED ORDER — PHENYLEPHRINE HCL-NACL 10-0.9 MG/250ML-% IV SOLN
INTRAVENOUS | Status: DC | PRN
  Administered 2020-12-27: 50 ug/min via INTRAVENOUS

## 2020-12-27 MED ORDER — INSULIN ASPART 100 UNIT/ML ~~LOC~~ SOLN
0.0000 [IU] | Freq: Three times a day (TID) | SUBCUTANEOUS | Status: DC
  Administered 2020-12-27: 4 [IU] via SUBCUTANEOUS

## 2020-12-27 MED ORDER — PHENYLEPHRINE 40 MCG/ML (10ML) SYRINGE FOR IV PUSH (FOR BLOOD PRESSURE SUPPORT)
PREFILLED_SYRINGE | INTRAVENOUS | Status: AC
  Filled 2020-12-27: qty 10

## 2020-12-27 MED ORDER — ASPIRIN 81 MG PO CHEW
81.0000 mg | CHEWABLE_TABLET | Freq: Two times a day (BID) | ORAL | Status: DC
  Administered 2020-12-27 – 2020-12-28 (×2): 81 mg via ORAL
  Filled 2020-12-27 (×2): qty 1

## 2020-12-27 MED ORDER — DEXAMETHASONE SODIUM PHOSPHATE 4 MG/ML IJ SOLN
INTRAMUSCULAR | Status: DC | PRN
  Administered 2020-12-27: 4 mg via INTRAVENOUS

## 2020-12-27 MED ORDER — HYDROCHLOROTHIAZIDE 25 MG PO TABS
25.0000 mg | ORAL_TABLET | Freq: Every day | ORAL | Status: DC
  Administered 2020-12-27 – 2020-12-28 (×2): 25 mg via ORAL
  Filled 2020-12-27 (×2): qty 1

## 2020-12-27 SURGICAL SUPPLY — 78 items
ALCOHOL 70% 16 OZ (MISCELLANEOUS) ×2 IMPLANT
BAG DECANTER FOR FLEXI CONT (MISCELLANEOUS) ×2 IMPLANT
BANDAGE ESMARK 6X9 LF (GAUZE/BANDAGES/DRESSINGS) ×1 IMPLANT
BLADE SAG 18X100X1.27 (BLADE) ×2 IMPLANT
BNDG ESMARK 6X9 LF (GAUZE/BANDAGES/DRESSINGS) ×2
BOWL SMART MIX CTS (DISPOSABLE) ×2 IMPLANT
CEMENT BONE REFOBACIN R1X40 US (Cement) ×4 IMPLANT
CLSR STERI-STRIP ANTIMIC 1/2X4 (GAUZE/BANDAGES/DRESSINGS) IMPLANT
COOLER ICEMAN CLASSIC (MISCELLANEOUS) ×2 IMPLANT
COVER SURGICAL LIGHT HANDLE (MISCELLANEOUS) ×2 IMPLANT
COVER WAND RF STERILE (DRAPES) IMPLANT
CUFF TOURN SGL QUICK 34 (TOURNIQUET CUFF) ×2
CUFF TOURN SGL QUICK 42 (TOURNIQUET CUFF) IMPLANT
CUFF TRNQT CYL 34X4.125X (TOURNIQUET CUFF) ×1 IMPLANT
DERMABOND ADVANCED (GAUZE/BANDAGES/DRESSINGS) ×1
DERMABOND ADVANCED .7 DNX12 (GAUZE/BANDAGES/DRESSINGS) ×1 IMPLANT
DRAPE EXTREMITY T 121X128X90 (DISPOSABLE) ×4 IMPLANT
DRAPE HALF SHEET 40X57 (DRAPES) ×2 IMPLANT
DRAPE INCISE IOBAN 66X45 STRL (DRAPES) ×2 IMPLANT
DRAPE ORTHO SPLIT 77X108 STRL (DRAPES) ×4
DRAPE POUCH INSTRU U-SHP 10X18 (DRAPES) ×2 IMPLANT
DRAPE SURG ORHT 6 SPLT 77X108 (DRAPES) ×2 IMPLANT
DRAPE U-SHAPE 47X51 STRL (DRAPES) ×4 IMPLANT
DRSG AQUACEL AG ADV 3.5X10 (GAUZE/BANDAGES/DRESSINGS) ×2 IMPLANT
DURAPREP 26ML APPLICATOR (WOUND CARE) ×6 IMPLANT
ELECT CAUTERY BLADE 6.4 (BLADE) ×2 IMPLANT
ELECT REM PT RETURN 9FT ADLT (ELECTROSURGICAL) ×2
ELECTRODE REM PT RTRN 9FT ADLT (ELECTROSURGICAL) ×1 IMPLANT
FEMORAL KNEE COMP SZ 8 STND LT (Knees) ×2 IMPLANT
FEMORAL KNEE COMP SZ 8STD LT (Knees) ×1 IMPLANT
GLOVE ECLIPSE 7.0 STRL STRAW (GLOVE) ×6 IMPLANT
GLOVE SKINSENSE NS SZ7.5 (GLOVE) ×3
GLOVE SKINSENSE STRL SZ7.5 (GLOVE) ×3 IMPLANT
GLOVE SURG SYN 7.5  E (GLOVE) ×8
GLOVE SURG SYN 7.5 E (GLOVE) ×4 IMPLANT
GLOVE SURG UNDER POLY LF SZ7 (GLOVE) ×2 IMPLANT
GLOVE SURG UNDER POLY LF SZ7.5 (GLOVE) ×6 IMPLANT
GOWN STRL REIN XL XLG (GOWN DISPOSABLE) ×2 IMPLANT
GOWN STRL REUS W/ TWL LRG LVL3 (GOWN DISPOSABLE) ×1 IMPLANT
GOWN STRL REUS W/TWL LRG LVL3 (GOWN DISPOSABLE) ×2
HANDPIECE INTERPULSE COAX TIP (DISPOSABLE) ×2
HDLS TROCR DRIL PIN KNEE 75 (PIN) ×2
HOOD PEEL AWAY FLYTE STAYCOOL (MISCELLANEOUS) ×4 IMPLANT
INSERT ARTISURF SZ 8-11 EF 13 (Insert) ×2 IMPLANT
JET LAVAGE IRRISEPT WOUND (IRRIGATION / IRRIGATOR) ×2
KIT BASIN OR (CUSTOM PROCEDURE TRAY) ×2 IMPLANT
KIT TURNOVER KIT B (KITS) ×2 IMPLANT
LAVAGE JET IRRISEPT WOUND (IRRIGATION / IRRIGATOR) ×1 IMPLANT
MANIFOLD NEPTUNE II (INSTRUMENTS) ×2 IMPLANT
MARKER SKIN DUAL TIP RULER LAB (MISCELLANEOUS) ×2 IMPLANT
NEEDLE SPNL 18GX3.5 QUINCKE PK (NEEDLE) ×2 IMPLANT
NS IRRIG 1000ML POUR BTL (IV SOLUTION) ×2 IMPLANT
PACK TOTAL JOINT (CUSTOM PROCEDURE TRAY) ×2 IMPLANT
PAD ARMBOARD 7.5X6 YLW CONV (MISCELLANEOUS) ×4 IMPLANT
PAD COLD SHLDR WRAP-ON (PAD) ×2 IMPLANT
PIN DRILL HDLS TROCAR 75 4PK (PIN) ×1 IMPLANT
SAW OSC TIP CART 19.5X105X1.3 (SAW) ×2 IMPLANT
SCREW FEMALE HEX FIX 25X2.5 (ORTHOPEDIC DISPOSABLE SUPPLIES) ×2 IMPLANT
SET HNDPC FAN SPRY TIP SCT (DISPOSABLE) ×1 IMPLANT
STAPLER VISISTAT 35W (STAPLE) IMPLANT
STEM POLY PAT PLY 32M KNEE (Knees) ×2 IMPLANT
STEM TIBIA 5 DEG SZ E L KNEE (Knees) ×1 IMPLANT
SUCTION FRAZIER HANDLE 10FR (MISCELLANEOUS) ×2
SUCTION TUBE FRAZIER 10FR DISP (MISCELLANEOUS) ×1 IMPLANT
SUT ETHILON 2 0 FS 18 (SUTURE) ×6 IMPLANT
SUT MNCRL AB 4-0 PS2 18 (SUTURE) IMPLANT
SUT VIC AB 0 CT1 27 (SUTURE) ×4
SUT VIC AB 0 CT1 27XBRD ANBCTR (SUTURE) ×2 IMPLANT
SUT VIC AB 1 CTX 27 (SUTURE) ×6 IMPLANT
SUT VIC AB 2-0 CT1 27 (SUTURE) ×4
SUT VIC AB 2-0 CT1 TAPERPNT 27 (SUTURE) ×2 IMPLANT
SYR 50ML LL SCALE MARK (SYRINGE) ×4 IMPLANT
TIBIA STEM 5 DEG SZ E L KNEE (Knees) ×2 IMPLANT
TOWEL GREEN STERILE (TOWEL DISPOSABLE) ×2 IMPLANT
TOWEL GREEN STERILE FF (TOWEL DISPOSABLE) ×2 IMPLANT
TRAY CATH 16FR W/PLASTIC CATH (SET/KITS/TRAYS/PACK) IMPLANT
UNDERPAD 30X36 HEAVY ABSORB (UNDERPADS AND DIAPERS) ×2 IMPLANT
WRAP KNEE MAXI GEL POST OP (GAUZE/BANDAGES/DRESSINGS) IMPLANT

## 2020-12-27 NOTE — Anesthesia Procedure Notes (Signed)
Spinal  Patient location during procedure: OR Start time: 12/27/2020 7:23 AM End time: 12/27/2020 7:26 AM Reason for block: surgical anesthesia Staffing Performed: anesthesiologist  Anesthesiologist: Val Eagle, MD Preanesthetic Checklist Completed: patient identified, IV checked, risks and benefits discussed, surgical consent, monitors and equipment checked, pre-op evaluation and timeout performed Spinal Block Patient position: sitting Prep: DuraPrep Patient monitoring: heart rate, cardiac monitor, continuous pulse ox and blood pressure Approach: midline Location: L5-S1 Injection technique: single-shot Needle Needle type: Pencan  Needle gauge: 24 G Needle length: 9 cm Assessment Sensory level: T6 Events: CSF return

## 2020-12-27 NOTE — Transfer of Care (Signed)
Immediate Anesthesia Transfer of Care Note  Patient: Terri Estrada  Procedure(s) Performed: LEFT TOTAL KNEE ARTHROPLASTY (Left Knee)  Patient Location: PACU  Anesthesia Type:MAC and Spinal  Level of Consciousness: oriented and patient cooperative  Airway & Oxygen Therapy: Patient Spontanous Breathing and Patient connected to face mask oxygen  Post-op Assessment: Report given to RN and Post -op Vital signs reviewed and stable  Post vital signs: Reviewed  Last Vitals:  Vitals Value Taken Time  BP 100/71 12/27/20 0951  Temp    Pulse 71 12/27/20 0954  Resp 19 12/27/20 0954  SpO2 99 % 12/27/20 0954  Vitals shown include unvalidated device data.  Last Pain:  Vitals:   12/27/20 0557  TempSrc:   PainSc: 3          Complications: No complications documented.

## 2020-12-27 NOTE — Anesthesia Postprocedure Evaluation (Signed)
Anesthesia Post Note  Patient: Kati Riggenbach  Procedure(s) Performed: LEFT TOTAL KNEE ARTHROPLASTY (Left Knee)     Patient location during evaluation: PACU Anesthesia Type: Regional, MAC and Spinal Level of consciousness: awake and alert Pain management: pain level controlled Vital Signs Assessment: post-procedure vital signs reviewed and stable Respiratory status: spontaneous breathing, nonlabored ventilation, respiratory function stable and patient connected to nasal cannula oxygen Cardiovascular status: stable and blood pressure returned to baseline Postop Assessment: no apparent nausea or vomiting and spinal receding Anesthetic complications: no   No complications documented.  Last Vitals:  Vitals:   12/27/20 1115 12/27/20 1400  BP: 123/71 126/83  Pulse: 62 91  Resp: 16 18  Temp: 36.6 C   SpO2: 99% 97%    Last Pain:  Vitals:   12/27/20 1508  TempSrc:   PainSc: Asleep                 Emmanuel Ercole

## 2020-12-27 NOTE — Discharge Instructions (Signed)

## 2020-12-27 NOTE — Evaluation (Signed)
Physical Therapy Evaluation Patient Details Name: Terri Estrada MRN: [id number] DOB: 1951/03/05 Today's Date: 12/27/2020   History of Present Illness  Pt adm for lt TKR on 3/21. PMH - arthritis, CAD, depression, MI, obesity, DM, rt TKR.  Clinical Impression  Pt admitted with above diagnosis and presents to PT with functional limitations due to deficits listed below (See PT problem list). Pt needs skilled PT to maximize independence and safety to allow discharge to home with family support.      Follow Up Recommendations Follow surgeon's recommendation for DC plan and follow-up therapies    Equipment Recommendations  None recommended by PT    Recommendations for Other Services       Precautions / Restrictions Precautions Precautions: Knee Restrictions Weight Bearing Restrictions: Yes LLE Weight Bearing: Weight bearing as tolerated      Mobility  Bed Mobility Overal bed mobility: Needs Assistance Bed Mobility: Supine to Sit     Supine to sit: Min guard;HOB elevated     General bed mobility comments: Incr time    Transfers Overall transfer level: Needs assistance Equipment used: Rolling walker (2 wheeled) Transfers: Sit to/from Stand Sit to Stand: Min guard         General transfer comment: Assist for safety and lines  Ambulation/Gait Ambulation/Gait assistance: Min guard Gait Distance (Feet): 90 Feet Assistive device: Rolling walker (2 wheeled) Gait Pattern/deviations: Step-through pattern;Decreased stance time - left Gait velocity: decr Gait velocity interpretation: <1.31 ft/sec, indicative of household ambulator General Gait Details: Assist for Industrial/product designer    Modified Rankin (Stroke Patients Only)       Balance Overall balance assessment: No apparent balance deficits (not formally assessed)                                           Pertinent Vitals/Pain Pain Assessment:  Faces Faces Pain Scale: Hurts a little bit Pain Location: lt knee Pain Descriptors / Indicators: Sore Pain Intervention(s): Limited activity within patient's tolerance;Repositioned    Home Living Family/patient expects to be discharged to:: Private residence Living Arrangements: Alone Available Help at Discharge: Family;Available PRN/intermittently Type of Home: House Home Access: Stairs to enter Entrance Stairs-Rails: Right Entrance Stairs-Number of Steps: 5 Home Layout: One level Home Equipment: Walker - 2 wheels;Cane - single point;Bedside commode      Prior Function Level of Independence: Independent         Comments: Retired     Higher education careers adviser        Extremity/Trunk Assessment   Upper Extremity Assessment Upper Extremity Assessment: Overall WFL for tasks assessed    Lower Extremity Assessment Lower Extremity Assessment: LLE deficits/detail LLE Deficits / Details: Strength greater than or equal to 3/5       Communication   Communication: No difficulties  Cognition Arousal/Alertness: Awake/alert Behavior During Therapy: WFL for tasks assessed/performed Overall Cognitive Status: Within Functional Limits for tasks assessed                                        General Comments      Exercises Total Joint Exercises Quad Sets: Strengthening;Left;5 reps;Supine Long Arc Quad: AAROM;Left;5 reps;Seated Knee Flexion: AROM;AAROM;Left;5 reps;Seated Goniometric ROM: 0-90 degrees active assistive  in sitting   Assessment/Plan    PT Assessment Patient needs continued PT services  PT Problem List Decreased strength;Decreased range of motion;Decreased mobility       PT Treatment Interventions DME instruction;Gait training;Stair training;Functional mobility training;Therapeutic activities;Therapeutic exercise;Patient/family education    PT Goals (Current goals can be found in the Care Plan section)  Acute Rehab PT Goals Patient Stated Goal:  return to walking at home PT Goal Formulation: With patient Time For Goal Achievement: 01/03/21 Potential to Achieve Goals: Good    Frequency 7X/week   Barriers to discharge Inaccessible home environment stairs to enter    Co-evaluation               AM-PAC PT "6 Clicks" Mobility  Outcome Measure Help needed turning from your back to your side while in a flat bed without using bedrails?: None Help needed moving from lying on your back to sitting on the side of a flat bed without using bedrails?: None Help needed moving to and from a bed to a chair (including a wheelchair)?: A Little Help needed standing up from a chair using your arms (e.g., wheelchair or bedside chair)?: A Little Help needed to walk in hospital room?: A Little Help needed climbing 3-5 steps with a railing? : A Little 6 Click Score: 20    End of Session Equipment Utilized During Treatment: Gait belt Activity Tolerance: Patient tolerated treatment well Patient left: in chair;with call bell/phone within reach;with family/visitor present Nurse Communication: Mobility status PT Visit Diagnosis: Other abnormalities of gait and mobility (R26.89)    Time: 7062-3762 PT Time Calculation (min) (ACUTE ONLY): 26 min   Charges:   PT Evaluation $PT Eval Moderate Complexity: 1 Mod PT Treatments $Gait Training: 8-22 mins        [facility] PT Acute Rehabilitation Services Pager [phone] Office [phone]   Angelina Ok [facility] 12/27/2020, 6:11 PM

## 2020-12-27 NOTE — H&P (Signed)
PREOPERATIVE H&P  Chief Complaint: left knee degenerative joint disease  HPI: Terri Estrada is a 70 y.o. female who presents for surgical treatment of left knee degenerative joint disease.  She denies any changes in medical history.  Past Medical History:  Diagnosis Date  . Arthritis   . CAD in native artery    a. MI 2001 s/p PTCA/stenting to mid-distal junction of RCA - residual dx treated medically.  . Depression    pt reports having it intermittently  . Diverticulosis of colon (without mention of hemorrhage)   . Essential hypertension, benign   . MI (myocardial infarction) (HCC)   . Mild pulmonary hypertension (HCC)   . Obesity, unspecified   . Onychia and paronychia of toe   . Osteoarthrosis, unspecified whether generalized or localized, unspecified site   . Other and unspecified hyperlipidemia   . PONV (postoperative nausea and vomiting)   . Sickle-cell trait (HCC)   . Sinus bradycardia    a. HR 40s even on low dose metoprolol - BB stopped with resolution.  . Streptococcal sore throat   . Type II or unspecified type diabetes mellitus without mention of complication, not stated as uncontrolled    Past Surgical History:  Procedure Laterality Date  . ANGIOPLASTY  09/21/00  . CARDIAC CATHETERIZATION    . JOINT REPLACEMENT    . TOTAL KNEE ARTHROPLASTY Right 02/23/2020   Procedure: RIGHT TOTAL KNEE ARTHROPLASTY;  Surgeon: Tarry Kos, MD;  Location: [facility] OR;  Service: Orthopedics;  Laterality: Right;   Social History   Socioeconomic History  . Marital status: Married    Spouse name: Not on file  . Number of children: 2  . Years of education: Not on file  . Highest education level: Not on file  Occupational History  . Occupation: DAY Associate Professor: SELF-EMPLOYED  Tobacco Use  . Smoking status: Former Games developer  . Smokeless tobacco: Never Used  . Tobacco comment: quit nov 2001  Vaping Use  . Vaping Use: Never used  Substance and Sexual Activity  .  Alcohol use: No  . Drug use: No  . Sexual activity: Yes    Birth control/protection: Post-menopausal  Other Topics Concern  . Not on file  Social History Narrative  . Not on file   Social Determinants of Health   Financial Resource Strain: Not on file  Food Insecurity: No Food Insecurity  . Worried About Programme researcher, broadcasting/film/video in the Last Year: Never true  . Ran Out of Food in the Last Year: Never true  Transportation Needs: Not on file  Physical Activity: Not on file  Stress: Not on file  Social Connections: Not on file   Family History  Problem Relation Age of Onset  . Sickle cell anemia Daughter   . Hypertension Mother   . Diabetes type II Mother   . Diabetes type II Sister   . Breast cancer Sister   . Cancer Other   . Stroke Other   . Diabetes Other    Allergies  Allergen Reactions  . Ace Inhibitors     REACTION: cough  . Shellfish Allergy Swelling  . Zocor [Simvastatin] Other (See Comments)    Muscle aches (severe) in legs; has taken Lipitor without any issues   Prior to Admission medications   Medication Sig Start Date End Date Taking? Authorizing Provider  acetaminophen (TYLENOL) 650 MG CR tablet Take 1,300 mg by mouth in the morning and at bedtime.   Yes  [provider]  amLODipine (NORVASC) 10 MG tablet Take 1 tablet (10 mg total) by mouth daily. 09/21/20  Yes Lars Masson, MD  aspirin EC 81 MG tablet Take 1 tablet (81 mg total) by mouth 2 (two) times daily. To be taken after surgery 12/22/20  Yes Cristie Hem, PA-C  atorvastatin (LIPITOR) 80 MG tablet Take 1 tablet (80 mg total) by mouth daily. Patient taking differently: Take 80 mg by mouth every evening. 09/20/20  Yes Lars Masson, MD  calcium carbonate (OSCAL) 1500 (600 Ca) MG TABS tablet Take 600 mg by mouth 2 (two) times daily.   Yes [provider]  carboxymethylcellulose (REFRESH PLUS) 0.5 % SOLN Place 1 drop into both eyes 3 (three) times daily as needed (dry/irritated  eyes.).   Yes [provider]  cetirizine (ZYRTEC) 10 MG tablet Take 1 tablet (10 mg total) by mouth daily. Patient taking differently: Take 10 mg by mouth at bedtime. 12/19/19  Yes Melene Plan, MD  Cholecalciferol 25 MCG (1000 UT) tablet Take 1,000 Units by mouth daily.   Yes [provider]  Cinnamon 500 MG TABS Take 500 mg by mouth daily.   Yes [provider]  ciprofloxacin (CIPRO) 250 MG tablet Take 1 tablet (250 mg total) by mouth 2 (two) times daily for 6 days. 12/23/20 12/29/20 Yes Cristie Hem, PA-C  Coenzyme Q10 (COQ10) 100 MG CAPS Take 100 mg by mouth in the morning and at bedtime.   Yes [provider]  docusate sodium (COLACE) 100 MG capsule Take 1 capsule (100 mg total) by mouth daily as needed. 12/22/20 12/22/21 Yes Cristie Hem, PA-C  ezetimibe (ZETIA) 10 MG tablet Take 10 mg by mouth daily.   Yes [provider]  hydrochlorothiazide (HYDRODIURIL) 25 MG tablet Take 1 tablet (25 mg total) by mouth daily. 09/20/20  Yes Lars Masson, MD  isosorbide mononitrate (IMDUR) 30 MG 24 hr tablet TAKE ONE TABLET BY MOUTH DAILY. Patient taking differently: Take 30 mg by mouth daily. 09/20/20  Yes Lars Masson, MD  losartan (COZAAR) 25 MG tablet Take 1 tablet (25 mg total) by mouth daily. 09/21/20  Yes Lars Masson, MD  methocarbamol (ROBAXIN) 500 MG tablet Take 1 tablet (500 mg total) by mouth 2 (two) times daily as needed. To be taken after surgery 12/22/20   Cristie Hem, PA-C  Multiple Vitamins-Minerals (MULTIVITAMINS THER. W/MINERALS) TABS Take 1 tablet by mouth every evening.   Yes [provider]  Omega-3 Fatty Acids (FISH OIL) 1200 MG CAPS Take 1,200 mg by mouth 2 (two) times daily.   Yes [provider]  ondansetron (ZOFRAN) 4 MG tablet Take 1 tablet (4 mg total) by mouth every 8 (eight) hours as needed for nausea or vomiting. 12/22/20   Cristie Hem, PA-C  oxyCODONE-acetaminophen (PERCOCET) 5-325  MG tablet Take 1-2 tablets by mouth every 6 (six) hours as needed. To be taken after surgery 12/22/20   Cristie Hem, PA-C  Probiotic Product (PROBIOTIC DAILY) CAPS Take 1 capsule by mouth every evening.   Yes [provider]  sulfamethoxazole-trimethoprim (BACTRIM DS) 800-160 MG tablet Take 1 tablet by mouth 2 (two) times daily. To be taken after surgery 12/22/20   Cristie Hem, PA-C  traMADol (ULTRAM) 50 MG tablet Take 1 tablet (50 mg total) by mouth 3 (three) times daily as needed. Patient taking differently: Take 50 mg by mouth 3 (three) times daily as needed (pain). 11/29/20  Yes  Cristie Hem, PA-C     Positive ROS: All other systems have been reviewed and were otherwise negative with the exception of those mentioned in the HPI and as above.  Physical Exam: General: Alert, no acute distress Cardiovascular: No pedal edema Respiratory: No cyanosis, no use of accessory musculature GI: abdomen soft Skin: No lesions in the area of chief complaint Neurologic: Sensation intact distally Psychiatric: Patient is competent for consent with normal mood and affect Lymphatic: no lymphedema  MUSCULOSKELETAL: exam stable  Assessment: left knee degenerative joint disease  Plan: Plan for Procedure(s): LEFT TOTAL KNEE ARTHROPLASTY  The risks benefits and alternatives were discussed with the patient including but not limited to the risks of nonoperative treatment, versus surgical intervention including infection, bleeding, nerve injury,  blood clots, cardiopulmonary complications, morbidity, mortality, among others, and they were willing to proceed.   Preoperative templating of the joint replacement has been completed, documented, and submitted to the Operating Room personnel in order to optimize intra-operative equipment management.   Glee Arvin, MD 12/27/2020 6:05 AM

## 2020-12-27 NOTE — Op Note (Signed)
Total Knee Arthroplasty Procedure Note  Preoperative diagnosis: Left knee osteoarthritis  Postoperative diagnosis:same  Operative procedure: Left total knee arthroplasty. CPT [phone]  Surgeon: N. Glee Arvin, MD  Assist: Oneal Grout, PA-C; necessary for the timely completion of procedure and due to complexity of procedure.  Anesthesia: Spinal, regional, local  Tourniquet time: see anesthesia record  Implants used: Zimmer persona Femur: CR 8 Tibia: E Patella: 32 mm Polyethylene: 13 mm, MC  Indication: Terri Estrada is a 70 y.o. year old female with a history of knee pain. Having failed conservative management, the patient elected to proceed with a total knee arthroplasty.  We have reviewed the risk and benefits of the surgery and they elected to proceed after voicing understanding.  Procedure:  After informed consent was obtained and understanding of the risk were voiced including but not limited to bleeding, infection, damage to surrounding structures including nerves and vessels, blood clots, leg length inequality and the failure to achieve desired results, the operative extremity was marked with verbal confirmation of the patient in the holding area.   The patient was then brought to the operating room and transported to the operating room table in the supine position.  A tourniquet was applied to the operative extremity around the upper thigh. The operative limb was then prepped and draped in the usual sterile fashion and preoperative antibiotics were administered.  A time out was performed prior to the start of surgery confirming the correct extremity, preoperative antibiotic administration, as well as team members, implants and instruments available for the case. Correct surgical site was also confirmed with preoperative radiographs. The limb was then elevated for exsanguination and the tourniquet was inflated. A midline incision was made and a standard medial  parapatellar approach was performed.  The infrapatellar fat pad was removed.  The knee showed severe degenerative wear throughout.  Suprapatellar synovium was removed to reveal the anterior distal femoral cortex.  A medial peel was performed to release the capsule of the medial tibial plateau.  The patella was then everted and was prepared and sized to a 32 mm.  A cover was placed on the patella for protection from retractors.  The knee was then brought into full flexion and we then turned our attention to the femur.  The cruciates were sacrificed.  Start site was drilled in the femur and the intramedullary distal femoral cutting guide was placed, set at 3 degrees valgus, taking 10 mm of distal resection. The distal cut was made. Osteophytes were then removed.  Next, the proximal tibial cutting guide was placed with appropriate slope, varus/valgus alignment and depth of resection. The proximal tibial cut was made. Gap blocks were then used to assess the extension gap and alignment, and appropriate soft tissue releases were performed. Attention was turned back to the femur, which was sized using the sizing guide to a size 8. Appropriate rotation of the femoral component was determined using epicondylar axis, Whiteside's line, and assessing the flexion gap under ligament tension. The appropriate size 4-in-1 cutting block was placed and checked with an angel wing and cuts were made. Posterior femoral osteophytes and uncapped bone were then removed with the curved osteotome.  Trial components were placed, and stability was checked in full extension, mid-flexion, and deep flexion. Proper tibial rotation was determined and marked.  The patella tracked well without a lateral release.  The femoral lugs were then drilled. Trial components were then removed and tibial preparation performed.  The tibia was sized for  a size E component.  The bony surfaces were irrigated with a pulse lavage and then dried. Bone cement was  vacuum mixed on the back table, and the final components sized above were cemented into place.  Antibiotic irrigation was placed in the knee joint and soft tissues while the cement cured.  After cement had finished curing, excess cement was removed. The stability of the construct was re-evaluated throughout a range of motion and found to be acceptable. The trial liner was removed, the knee was copiously irrigated, and the knee was re-evaluated for any excess bone debris. The real polyethylene liner, 13 mm thick, was inserted and checked to ensure the locking mechanism had engaged appropriately. The tourniquet was deflated and hemostasis was achieved. The wound was irrigated with normal saline.  One gram of vancomycin powder was placed in the surgical bed.  A mixture of topical 0.25% bupivacaine and meloxicam was placed in the capulse.  Capsular closure was performed with a #1 vicryl, subcutaneous fat closed with a 0 vicryl suture, then subcutaneous tissue closed with interrupted 2.0 vicryl suture. The skin was then closed with a 2.0 nylon and dermabond. A sterile dressing was applied.  The patient was awakened in the operating room and taken to recovery in stable condition. All sponge, needle, and instrument counts were correct at the end of the case.  [name] was necessary for opening, closing, retracting, limb positioning and overall facilitation and completion of the surgery.  Position: supine  Complications: none.  Time Out: performed   Drains/Packing: none  Estimated blood loss: minimal  Returned to Recovery Room: in good condition.   Antibiotics: yes   Mechanical VTE (DVT) Prophylaxis: sequential compression devices, TED thigh-high  Chemical VTE (DVT) Prophylaxis: aspirin  Fluid Replacement  Crystalloid: see anesthesia record Blood: none  FFP: none   Specimens Removed: 1 to pathology   Sponge and Instrument Count Correct? yes   PACU: portable radiograph - knee AP and  Lateral   Plan/RTC: Return in 2 weeks for wound check.   Weight Bearing/Load Lower Extremity: full   Implant Name Type Inv. Item Serial No. Manufacturer Lot No. LRB No. Used Action  CEMENT BONE REFOBACIN [id number] [place] - SN/A Cement CEMENT BONE REFOBACIN [id number] [place] N/A [employer](ORTH,TRAU,BIO,SG) [id number] Left 2 Implanted  TIBIA STEM 5 DEG SZ E L KNEE - SN/A Knees TIBIA STEM 5 DEG SZ E L KNEE N/A [employer](ORTH,TRAU,BIO,SG) [id number] Left 1 Implanted  FEMORAL KNEE COMP SZ 8 STND LT - SN/A Knees FEMORAL KNEE COMP SZ 8 STND LT N/A [employer](ORTH,TRAU,BIO,SG) [id number] Left 1 Implanted  STEM POLY PAT PLY 70M KNEE - SN/A Knees STEM POLY PAT PLY 70M KNEE N/A [employer](ORTH,TRAU,BIO,SG) [id number] Left 1 Implanted  INSERT ARTISURF SZ 8-11 EF 13 - SN/A Insert INSERT ARTISURF SZ 8-11 EF 13 N/A [employer](ORTH,TRAU,BIO,SG) [id number] Left 1 Implanted    N. Glee Arvin, MD [facility] 9:01 AM

## 2020-12-27 NOTE — Anesthesia Procedure Notes (Signed)
Procedure Name: MAC Date/Time: 12/27/2020 7:30 AM Performed by: Jenne Campus, CRNA Pre-anesthesia Checklist: Patient identified, Emergency Drugs available, Suction available and Patient being monitored Oxygen Delivery Method: Simple face mask

## 2020-12-27 NOTE — Anesthesia Procedure Notes (Signed)
Anesthesia Regional Block: Adductor canal block   Pre-Anesthetic Checklist: ,, timeout performed, Correct Patient, Correct Site, Correct Laterality, Correct Procedure, Correct Position, site marked, Risks and benefits discussed,  Surgical consent,  Pre-op evaluation,  At surgeon's request and post-op pain management  Laterality: Left and Lower  Prep: chloraprep       Needles:  Injection technique: Single-shot     Needle Length: 9cm  Needle Gauge: 22     Additional Needles: Arrow StimuQuik ECHO Echogenic Stimulating PNB Needle  Procedures:,,,, ultrasound used (permanent image in chart),,,,  Narrative:  Start time: 12/27/2020 7:09 AM End time: 12/27/2020 7:13 AM Injection made incrementally with aspirations every 5 mL.  Performed by: Personally  Anesthesiologist: Val Eagle, MD

## 2020-12-27 NOTE — Progress Notes (Signed)
Orthopedic Tech Progress Note Patient Details:  Terri Estrada 10/25/1950 [id number]  CPM Left Knee CPM Left Knee: On Left Knee Flexion (Degrees): 0 Left Knee Extension (Degrees): 60  Post Interventions Patient Tolerated: Well Instructions Provided: Care of device  Donald Pore 12/27/2020, 11:28 AM

## 2020-12-28 ENCOUNTER — Other Ambulatory Visit: Payer: Self-pay | Admitting: Physician Assistant

## 2020-12-28 ENCOUNTER — Encounter (HOSPITAL_COMMUNITY): Payer: Self-pay | Admitting: Orthopaedic Surgery

## 2020-12-28 DIAGNOSIS — M1712 Unilateral primary osteoarthritis, left knee: Secondary | ICD-10-CM | POA: Diagnosis not present

## 2020-12-28 LAB — BASIC METABOLIC PANEL
Anion gap: 8 (ref 5–15)
BUN: 9 mg/dL (ref 8–23)
CO2: 24 mmol/L (ref 22–32)
Calcium: 9.2 mg/dL (ref 8.9–10.3)
Chloride: 105 mmol/L (ref 98–111)
Creatinine, Ser: 0.65 mg/dL (ref 0.44–1.00)
GFR, Estimated: >60 mL/min (ref >60)
Glucose, Bld: 112 mg/dL — ABNORMAL HIGH (ref 70–99)
Potassium: 3.3 mmol/L — ABNORMAL LOW (ref 3.5–5.1)
Sodium: 137 mmol/L (ref 135–145)

## 2020-12-28 LAB — CBC
HCT: 33.9 % — ABNORMAL LOW (ref 36.0–46.0)
Hemoglobin: 12.1 g/dL (ref 12.0–15.0)
MCH: 31.5 pg (ref 26.0–34.0)
MCHC: 35.7 g/dL (ref 30.0–36.0)
MCV: 88.3 fL (ref 80.0–100.0)
Platelets: 286 10*3/uL (ref 150–400)
RBC: 3.84 MIL/uL — ABNORMAL LOW (ref 3.87–5.11)
RDW: 12.5 % (ref 11.5–15.5)
WBC: 10.1 10*3/uL (ref 4.0–10.5)
nRBC: 0 % (ref 0.0–0.2)

## 2020-12-28 LAB — GLUCOSE, CAPILLARY: Glucose-Capillary: 117 mg/dL — ABNORMAL HIGH (ref 70–99)

## 2020-12-28 MED ORDER — TRAMADOL HCL 50 MG PO TABS: 50.0000 mg | ORAL_TABLET | Freq: Four times a day (QID) | ORAL | 0 refills | Status: DC | PRN

## 2020-12-28 NOTE — Discharge Summary (Signed)
Patient ID: Terri Estrada MRN: [id number] DOB/AGE: 1950/12/04 70 y.o.  Admit date: 12/27/2020 Discharge date: 12/28/2020  Admission Diagnoses:  Principal Problem:   Primary osteoarthritis of left knee Active Problems:   Status post total left knee replacement   Discharge Diagnoses:  Same  Past Medical History:  Diagnosis Date   Arthritis    CAD in native artery    a. MI 2001 s/p PTCA/stenting to mid-distal junction of RCA - residual dx treated medically.   Depression    pt reports having it intermittently   Diverticulosis of colon (without mention of hemorrhage)    Essential hypertension, benign    MI (myocardial infarction) (HCC)    Mild pulmonary hypertension (HCC)    Obesity, unspecified    Onychia and paronychia of toe    Osteoarthrosis, unspecified whether generalized or localized, unspecified site    Other and unspecified hyperlipidemia    PONV (postoperative nausea and vomiting)    Sickle-cell trait (HCC)    Sinus bradycardia    a. HR 40s even on low dose metoprolol - BB stopped with resolution.   Streptococcal sore throat    Type II or unspecified type diabetes mellitus without mention of complication, not stated as uncontrolled     Surgeries: Procedure(s): LEFT TOTAL KNEE ARTHROPLASTY on 12/27/2020   Consultants:   Discharged Condition: Improved  Hospital Course: Terri Estrada is an 70 y.o. female who was admitted 12/27/2020 for operative treatment ofPrimary osteoarthritis of left knee. Patient has severe unremitting pain that affects sleep, daily activities, and work/hobbies. After pre-op clearance the patient was taken to the operating room on 12/27/2020 and underwent  Procedure(s): LEFT TOTAL KNEE ARTHROPLASTY.    Patient was given perioperative antibiotics:  Anti-infectives (From admission, onward)   Start     Dose/Rate Route Frequency Ordered Stop   12/27/20 2000  ciprofloxacin (CIPRO) tablet 250 mg        250 mg Oral 2 times  daily 12/27/20 1114 12/29/20 0759   12/27/20 1330  ceFAZolin (ANCEF) IVPB 2g/100 mL premix        2 g 200 mL/hr over 30 Minutes Intravenous Every 6 hours 12/27/20 1114 12/27/20 1846   12/27/20 0753  vancomycin (VANCOCIN) powder  Status:  Discontinued          As needed 12/27/20 0756 12/27/20 0947   12/27/20 0600  ceFAZolin (ANCEF) IVPB 2g/100 mL premix        2 g 200 mL/hr over 30 Minutes Intravenous On call to O.R. 12/27/20 0543 12/27/20 0740       Patient was given sequential compression devices, early ambulation, and chemoprophylaxis to prevent DVT.  Patient benefited maximally from hospital stay and there were no complications.    Recent vital signs:  Patient Vitals for the past 24 hrs:  BP Temp Temp src Pulse Resp SpO2  12/28/20 0501 130/77 98.7 F (37.1 C) Oral 70 18 100 %  12/28/20 0000 122/75 98.1 F (36.7 C) Oral 73 16 98 %  12/27/20 2008 126/72 98.3 F (36.8 C) Oral 75 18 99 %  12/27/20 1700 125/85 97.8 F (36.6 C) Oral 81 16 98 %  12/27/20 1400 126/83 -- -- 91 18 97 %  12/27/20 1115 123/71 97.9 F (36.6 C) Oral 62 16 99 %  12/27/20 1052 (!) 116/91 98.1 F (36.7 C) -- 60 14 98 %  12/27/20 1037 111/77 -- -- (!) 42 14 97 %  12/27/20 1022 110/73 -- -- (!) 43 15 98 %  12/27/20  1009 111/80 -- -- (!) 47 16 100 %  12/27/20 0952 100/71 97.6 F (36.4 C) -- (!) 58 15 96 %     Recent laboratory studies:  Recent Labs    12/28/20 0222  WBC 10.1  HGB 12.1  HCT 33.9*  PLT 286  NA 137  K 3.3*  CL 105  CO2 24  BUN 9  CREATININE 0.65  GLUCOSE 112*  CALCIUM 9.2     Discharge Medications:   Allergies as of 12/28/2020      Reactions   Ace Inhibitors    REACTION: cough   Shellfish Allergy Swelling   Zocor [simvastatin] Other (See Comments)   Muscle aches (severe) in legs; has taken Lipitor without any issues      Medication List    STOP taking these medications   Fish Oil 1200 MG Caps   traMADol 50 MG tablet Commonly known as: ULTRAM     TAKE these  medications   acetaminophen 650 MG CR tablet Commonly known as: TYLENOL Take 1,300 mg by mouth in the morning and at bedtime.   amLODipine 10 MG tablet Commonly known as: NORVASC Take 1 tablet (10 mg total) by mouth daily.   aspirin EC 81 MG tablet Take 1 tablet (81 mg total) by mouth 2 (two) times daily. To be taken after surgery   atorvastatin 80 MG tablet Commonly known as: LIPITOR Take 1 tablet (80 mg total) by mouth daily. What changed: when to take this   calcium carbonate 1500 (600 Ca) MG Tabs tablet Commonly known as: OSCAL Take 600 mg by mouth 2 (two) times daily.   carboxymethylcellulose 0.5 % Soln Commonly known as: REFRESH PLUS Place 1 drop into both eyes 3 (three) times daily as needed (dry/irritated eyes.).   cetirizine 10 MG tablet Commonly known as: ZYRTEC Take 1 tablet (10 mg total) by mouth daily. What changed: when to take this   Cholecalciferol 25 MCG (1000 UT) tablet Take 1,000 Units by mouth daily.   Cinnamon 500 MG Tabs Take 500 mg by mouth daily.   ciprofloxacin 250 MG tablet Commonly known as: CIPRO Take 1 tablet (250 mg total) by mouth 2 (two) times daily for 6 days.   CoQ10 100 MG Caps Take 100 mg by mouth in the morning and at bedtime.   docusate sodium 100 MG capsule Commonly known as: Colace Take 1 capsule (100 mg total) by mouth daily as needed.   ezetimibe 10 MG tablet Commonly known as: ZETIA Take 10 mg by mouth daily.   hydrochlorothiazide 25 MG tablet Commonly known as: HYDRODIURIL Take 1 tablet (25 mg total) by mouth daily.   isosorbide mononitrate 30 MG 24 hr tablet Commonly known as: IMDUR TAKE ONE TABLET BY MOUTH DAILY. What changed:   how much to take  how to take this  when to take this  additional instructions   losartan 25 MG tablet Commonly known as: COZAAR Take 1 tablet (25 mg total) by mouth daily.   methocarbamol 500 MG tablet Commonly known as: Robaxin Take 1 tablet (500 mg total) by mouth 2  (two) times daily as needed. To be taken after surgery   multivitamins ther. w/minerals Tabs tablet Take 1 tablet by mouth every evening.   ondansetron 4 MG tablet Commonly known as: Zofran Take 1 tablet (4 mg total) by mouth every 8 (eight) hours as needed for nausea or vomiting.   oxyCODONE-acetaminophen 5-325 MG tablet Commonly known as: Percocet Take 1-2 tablets by mouth every  6 (six) hours as needed. To be taken after surgery   Probiotic Daily Caps Take 1 capsule by mouth every evening.   sulfamethoxazole-trimethoprim 800-160 MG tablet Commonly known as: BACTRIM DS Take 1 tablet by mouth 2 (two) times daily. To be taken after surgery            Durable Medical Equipment  (From admission, onward)         Start     Ordered   12/27/20 1115  DME Walker rolling  Once       Question:  Patient needs a walker to treat with the following condition  Answer:  Total knee replacement status   12/27/20 1114   12/27/20 1115  DME 3 n 1  Once        12/27/20 1114   12/27/20 1115  DME Bedside commode  Once       Question:  Patient needs a bedside commode to treat with the following condition  Answer:  Total knee replacement status   12/27/20 1114          Diagnostic Studies: DG Chest 2 View  Result Date: 12/24/2020 CLINICAL DATA:  69 year old female under preoperative evaluation prior to knee surgery. EXAM: CHEST - 2 VIEW COMPARISON:  Chest x-ray 02/19/2020. FINDINGS: Lung volumes are normal. No consolidative airspace disease. No pleural effusions. No pneumothorax. No pulmonary nodule or mass noted. Pulmonary vasculature and the cardiomediastinal silhouette are within normal limits. Atherosclerotic calcifications in the thoracic aorta. IMPRESSION: 1.  No radiographic evidence of acute cardiopulmonary disease. 2. Aortic atherosclerosis. Electronically Signed   By: Trudie Reed M.D.   On: 12/24/2020 10:50   DG Knee Left Port  Result Date: 12/27/2020 CLINICAL DATA:  Postop for  left total knee replacement EXAM: PORTABLE LEFT KNEE - 1-2 VIEW COMPARISON:  07/11/2012 FINDINGS: Total knee arthroplasty. Expected postoperative edema and gas about the anterior joint. No periprosthetic fracture. IMPRESSION: Expected appearance after left knee arthroplasty. Electronically Signed   By: Jeronimo Greaves M.D.   On: 12/27/2020 10:15    Disposition: Discharge disposition: 01-Home or Self Care          Follow-up Information    Tarry Kos, MD In 2 weeks.   Specialty: Orthopedic Surgery Why: For suture removal, For wound re-check Contact information: [address] [place] [address] [phone]                Signed: Cristie Hem 12/28/2020, 8:12 AM

## 2020-12-28 NOTE — Progress Notes (Signed)
Pt's family here to take home, pt d/c'd via wheelchair with her belongings, escorted by hospital volunteer.  Pt stated that her CPM was being delivered to her home.

## 2020-12-28 NOTE — Progress Notes (Signed)
Patient given discharge instructions and stated understanding. 

## 2020-12-28 NOTE — Progress Notes (Signed)
Physical Therapy Treatment Patient Details Name: Terri Estrada MRN: [id number] DOB: [birth year] Today's Date: 12/28/2020    History of Present Illness Pt adm for lt TKR on 3/21. PMH - arthritis, CAD, depression, MI, obesity, DM, rt TKR.    PT Comments    Pt is POD # 1 and is progressing well.  Pt demonstrates safe gait & transfers in order to return home from PT perspective once discharged by MD.  While in hospital, will continue to benefit from PT for skilled therapy to advance mobility and exercises.      Follow Up Recommendations  Follow surgeon's recommendation for DC plan and follow-up therapies     Equipment Recommendations  None recommended by PT    Recommendations for Other Services       Precautions / Restrictions Precautions Precautions: Knee Precaution Booklet Issued: Yes (comment) Precaution Comments: Provided HEP for TKA Restrictions LLE Weight Bearing: Weight bearing as tolerated    Mobility  Bed Mobility Overal bed mobility: Needs Assistance Bed Mobility: Sit to Supine       Sit to supine: Supervision        Transfers Overall transfer level: Needs assistance Equipment used: Rolling walker (2 wheeled) Transfers: Sit to/from Stand Sit to Stand: Supervision         General transfer comment: Performed x 2 with good safety and hand placement  Ambulation/Gait Ambulation/Gait assistance: Min guard;Supervision Gait Distance (Feet): 100 Feet Assistive device: Rolling walker (2 wheeled) Gait Pattern/deviations: Step-through pattern;Decreased stance time - left     General Gait Details: Step to L gait progressed to step through pattern with cues; also progressed to supervision; good safety and stability with RW   Stairs Stairs: Yes Stairs assistance: Min guard Stair Management: One rail Right;Two rails;Step to pattern;Forwards Number of Stairs: 10 General stair comments: Started with 2 rails and progressed to 1 rail.  Did require frequent  cues for sequencing   Wheelchair Mobility    Modified Rankin (Stroke Patients Only)       Balance Overall balance assessment: Needs assistance (steady with RW post op)   Sitting balance-Leahy Scale: Normal     Standing balance support: Bilateral upper extremity supported;No upper extremity supported Standing balance-Leahy Scale: Fair Standing balance comment: RW for ambulation post op - steady; static stand no Ad                            Cognition Arousal/Alertness: Awake/alert Behavior During Therapy: WFL for tasks assessed/performed Overall Cognitive Status: Within Functional Limits for tasks assessed                                        Exercises Total Joint Exercises Ankle Circles/Pumps: AROM;Both;10 reps;Supine Quad Sets: Strengthening;Left;Supine;AROM;10 reps Heel Slides: AAROM;Left;10 reps;Supine (educated on use of gait belt for AAROM<) Hip ABduction/ADduction: AAROM;Left;10 reps;Supine (educated on use of gait belt for AAROM) Long Arc Quad: AROM;Left;10 reps;Seated Knee Flexion: AROM;Left;10 reps;Seated Goniometric ROM: L knee 0 to 80 degrees ROM    General Comments General comments (skin integrity, edema, etc.): VSS; Educated on safe ice use, no pivots, resting with leg straight, and HEP      Pertinent Vitals/Pain Pain Assessment: 0-10 Pain Score: 4  Pain Location: lt knee Pain Descriptors / Indicators: Sore Pain Intervention(s): Limited activity within patient's tolerance;Monitored during session;Premedicated before session;Ice applied;Repositioned    Home Living  Prior Function            PT Goals (current goals can now be found in the care plan section) Acute Rehab PT Goals Patient Stated Goal: return to walking at home PT Goal Formulation: With patient Time For Goal Achievement: 01/03/21 Potential to Achieve Goals: Good Progress towards PT goals: Progressing toward goals     Frequency    7X/week      PT Plan Current plan remains appropriate    Co-evaluation              AM-PAC PT "6 Clicks" Mobility   Outcome Measure  Help needed turning from your back to your side while in a flat bed without using bedrails?: None Help needed moving from lying on your back to sitting on the side of a flat bed without using bedrails?: None Help needed moving to and from a bed to a chair (including a wheelchair)?: A Little Help needed standing up from a chair using your arms (e.g., wheelchair or bedside chair)?: A Little Help needed to walk in hospital room?: A Little Help needed climbing 3-5 steps with a railing? : A Little 6 Click Score: 20    End of Session Equipment Utilized During Treatment: Gait belt Activity Tolerance: Patient tolerated treatment well Patient left: with call bell/phone within reach;in bed Nurse Communication: Mobility status PT Visit Diagnosis: Other abnormalities of gait and mobility (R26.89)     Time: 0981-1914 PT Time Calculation (min) (ACUTE ONLY): 35 min  Charges:  $Gait Training: 8-22 mins $Therapeutic Exercise: 8-22 mins                     Terri Estrada, PT Acute Rehab Services Pager [phone] [facility] [phone]     Terri Estrada 12/28/2020, 11:07 AM

## 2020-12-28 NOTE — Plan of Care (Signed)
  Problem: Activity: Goal: Risk for activity intolerance will decrease Outcome: Progressing   Problem: Coping: Goal: Level of anxiety will decrease Outcome: Progressing   Problem: Pain Managment: Goal: General experience of comfort will improve Outcome: Progressing   Problem: Safety: Goal: Ability to remain free from injury will improve Outcome: Progressing   Problem: Skin Integrity: Goal: Risk for impaired skin integrity will decrease Outcome: Progressing   

## 2020-12-28 NOTE — Progress Notes (Signed)
Subjective: 1 Day Post-Op Procedure(s) (LRB): LEFT TOTAL KNEE ARTHROPLASTY (Left) Patient reports pain as mild.    Objective: Vital signs in last 24 hours: Temp:  [97.6 F (36.4 C)-98.7 F (37.1 C)] 98.7 F (37.1 C) (03/22 0501) Pulse Rate:  [42-91] 70 (03/22 0501) Resp:  [14-18] 18 (03/22 0501) BP: (100-130)/(71-91) 130/77 (03/22 0501) SpO2:  [96 %-100 %] 100 % (03/22 0501)  Intake/Output from previous day: 03/21 0701 - 03/22 0700 In: 2662.5 [P.O.:940; I.V.:1322.5; IV Piggyback:400] Out: 4750 [Urine:4700; Blood:50] Intake/Output this shift: No intake/output data recorded.  Recent Labs    12/28/20 0222  HGB 12.1   Recent Labs    12/28/20 0222  WBC 10.1  RBC 3.84*  HCT 33.9*  PLT 286   Recent Labs    12/28/20 0222  NA 137  K 3.3*  CL 105  CO2 24  BUN 9  CREATININE 0.65  GLUCOSE 112*  CALCIUM 9.2   No results for input(s): LABPT, INR in the last 72 hours.  Neurologically intact Neurovascular intact Sensation intact distally Intact pulses distally Dorsiflexion/Plantar flexion intact Incision: dressing C/D/I No cellulitis present Compartment soft   Assessment/Plan: 1 Day Post-Op Procedure(s) (LRB): LEFT TOTAL KNEE ARTHROPLASTY (Left) Advance diet Up with therapy D/C IV fluids Discharge home with home health likely this afternoon as long as she continues to progress with PT WBAT LLE    Anticipated LOS equal to or greater than 2 midnights due to - Age 3 and older with one or more of the following:  - Obesity  - Expected need for hospital services (PT, OT, Nursing) required for safe  discharge  - Anticipated need for postoperative skilled nursing care or inpatient rehab  - Active co-morbidities: Diabetes OR   - Unanticipated findings during/Post Surgery: None  - Patient is a high risk of re-admission due to: None    Cristie Hem 12/28/2020, 8:09 AM

## 2020-12-28 NOTE — TOC Transition Note (Signed)
Transition of Care [facility]) - CM/SW Discharge Note   Patient Details  Name: Terri Estrada MRN: [id number] Date of Birth: Feb 05, 1951  [facility] CM/SW Contact:  Epifanio Lesches, RN Phone Number: 12/28/2020, 10:53 AM   Clinical Narrative:    Patient will DC to: home Anticipated DC date: 12/28/2020 Family notified: yes Transport by: car  Presented with osteoarthritis of left knee, s/p total left knee replacement, 3/21.   Per MD patient ready for DC today.RN, patient, and patient's husband notified of DC. Prior to admit pt setup with [facility]) [facility] for HHPT services. Pt POD#1 .Marland KitchenMarland KitchenPT to evaluate prior to d/c. Pt without DME needs. CPM will be delivered to pt's  home once d/c by [facility].  Pt without Rx MED concerns or affordability issues. Post hospital f/u noted on AVS.  RNCM will sign off for now as intervention is no longer needed. Please consult [place] again if new needs arise.    Final next level of care: Home w Home Health Services Barriers to Discharge: No Barriers Identified   Patient Goals and CMS Choice     Choice offered to / list presented to : Patient  Discharge Placement                       Discharge Plan and Services                DME Arranged: CPM DME Agency: [facility] Date DME Agency Contacted: 12/28/20 Time DME Agency Contacted: 1051 Representative spoke with at DME Agency: Harrold Donath to deliver CPM to home once d/c HH Arranged: PT HH Agency: [facility] Date HH Agency Contacted: 12/28/20 Time HH Agency Contacted: 1052 Representative spoke with at [facility] Agency: [place]  Social Determinants of Health (SDOH) Interventions     Readmission Risk Interventions No flowsheet data found.

## 2021-01-06 ENCOUNTER — Other Ambulatory Visit: Payer: Self-pay | Admitting: Physician Assistant

## 2021-01-11 ENCOUNTER — Ambulatory Visit (INDEPENDENT_AMBULATORY_CARE_PROVIDER_SITE_OTHER): Payer: Medicare Other | Admitting: Physician Assistant

## 2021-01-11 ENCOUNTER — Encounter: Payer: Self-pay | Admitting: Orthopaedic Surgery

## 2021-01-11 ENCOUNTER — Other Ambulatory Visit: Payer: Self-pay

## 2021-01-11 VITALS — Ht 62.0 in | Wt 193.0 lb

## 2021-01-11 DIAGNOSIS — Z96652 Presence of left artificial knee joint: Secondary | ICD-10-CM

## 2021-01-11 MED ORDER — METHOCARBAMOL 500 MG PO TABS: 500.0000 mg | ORAL_TABLET | Freq: Two times a day (BID) | ORAL | 0 refills | Status: DC | PRN

## 2021-01-11 NOTE — Progress Notes (Signed)
Post-Op Visit Note   Patient: Terri Estrada           Date of Birth: May 05, 1951           MRN: [id number] Visit Date: 01/11/2021 PCP: Ronnald Ramp, MD   Assessment & Plan:  Chief Complaint:  Chief Complaint  Patient presents with  . Left Knee - Follow-up    Left total knee arthroplasty 12/27/2020   Visit Diagnoses:  1. Hx of total knee replacement, left     Plan: Patient is a pleasant 70 year old female who comes in today 2 weeks out left total knee replacement.  She has been doing well.  She has mild to moderate pain which is relieved with Tylenol and tramadol in addition to Robaxin at times.  No fevers or chills.  She has been getting home health physical therapy.  Examination of her left knee reveals a fully healed surgical scar with nylon sutures in place.  No evidence of infection or cellulitis.  Calves are soft nontender.  She is neurovascular intact distally.  Today, sutures were removed and Steri-Strips applied.  I have sent in an internal referral to start outpatient physical therapy.  I refilled her Robaxin.  Dental prophylaxis reinforced.  Follow-up with [place] in 4 weeks time for repeat evaluation and 2 view x-rays of the left knee.  Follow-Up Instructions: Return in about 4 weeks (around 02/08/2021).   Orders:  Orders Placed This Encounter  Procedures  . Ambulatory referral to Physical Therapy   Meds ordered this encounter  Medications  . methocarbamol (ROBAXIN) 500 MG tablet    Sig: Take 1 tablet (500 mg total) by mouth 2 (two) times daily as needed.    Dispense:  20 tablet    Refill:  0    Imaging: No new imaging  PMFS History: Patient Active Problem List   Diagnosis Date Noted  . Status post total left knee replacement 12/27/2020  . Primary osteoarthritis of left knee 12/26/2020  . Fatigue 06/18/2020  . Status post total right knee replacement 02/23/2020  . Acute seasonal allergic rhinitis 12/19/2019  . Pre-op evaluation 10/23/2019  .  Murmur 12/10/2015  . Pes planus of both feet 01/07/2015  . Depression 01/07/2015  . Female cystocele 06/26/2014  . Healthcare maintenance 12/17/2012  . Osteoarthritis 06/21/2010  . Hyperlipidemia 05/31/2009  . DIVERTICULOSIS, COLON 10/16/2008  . AORTIC STENOSIS, MILD 06/02/2008  . Diabetes mellitus type 2, controlled, without complications (HCC) 12/06/2006  . Obesity (BMI 30-39.9) 12/06/2006  . SICKLE CELL TRAIT 12/06/2006  . HYPERTENSION, BENIGN SYSTEMIC 12/06/2006  . CORONARY, ARTERIOSCLEROSIS 12/06/2006   Past Medical History:  Diagnosis Date  . Arthritis   . CAD in native artery    a. MI 2001 s/p PTCA/stenting to mid-distal junction of RCA - residual dx treated medically.  . Depression    pt reports having it intermittently  . Diverticulosis of colon (without mention of hemorrhage)   . Essential hypertension, benign   . MI (myocardial infarction) (HCC)   . Mild pulmonary hypertension (HCC)   . Obesity, unspecified   . Onychia and paronychia of toe   . Osteoarthrosis, unspecified whether generalized or localized, unspecified site   . Other and unspecified hyperlipidemia   . PONV (postoperative nausea and vomiting)   . Sickle-cell trait (HCC)   . Sinus bradycardia    a. HR 40s even on low dose metoprolol - BB stopped with resolution.  . Streptococcal sore throat   . Type II or unspecified type diabetes  mellitus without mention of complication, not stated as uncontrolled     Family History  Problem Relation Age of Onset  . Sickle cell anemia Daughter   . Hypertension Mother   . Diabetes type II Mother   . Diabetes type II Sister   . Breast cancer Sister   . Cancer Other   . Stroke Other   . Diabetes Other     Past Surgical History:  Procedure Laterality Date  . ANGIOPLASTY  09/21/00  . CARDIAC CATHETERIZATION    . JOINT REPLACEMENT    . TOTAL KNEE ARTHROPLASTY Right 02/23/2020   Procedure: RIGHT TOTAL KNEE ARTHROPLASTY;  Surgeon: Tarry Kos, MD;  Location: [facility]  OR;  Service: Orthopedics;  Laterality: Right;  . TOTAL KNEE ARTHROPLASTY Left 12/27/2020   Procedure: LEFT TOTAL KNEE ARTHROPLASTY;  Surgeon: Tarry Kos, MD;  Location: [facility] OR;  Service: Orthopedics;  Laterality: Left;   Social History   Occupational History  . Occupation: DAY Associate Professor: SELF-EMPLOYED  Tobacco Use  . Smoking status: Former Games developer  . Smokeless tobacco: Never Used  . Tobacco comment: quit nov 2001  Vaping Use  . Vaping Use: Never used  Substance and Sexual Activity  . Alcohol use: No  . Drug use: No  . Sexual activity: Yes    Birth control/protection: Post-menopausal

## 2021-01-24 ENCOUNTER — Telehealth: Payer: Self-pay | Admitting: Orthopaedic Surgery

## 2021-01-24 NOTE — Telephone Encounter (Signed)
Please advise 

## 2021-01-24 NOTE — Telephone Encounter (Signed)
Pt called asking for a refill of her tramadol to be sent to her [facility]. And pt would like a CB when this has been called in.   [phone]

## 2021-01-25 ENCOUNTER — Other Ambulatory Visit: Payer: Self-pay

## 2021-01-25 ENCOUNTER — Encounter: Payer: Self-pay | Admitting: Physical Therapy

## 2021-01-25 ENCOUNTER — Ambulatory Visit: Payer: Medicare Other | Attending: Physician Assistant | Admitting: Physical Therapy

## 2021-01-25 DIAGNOSIS — R600 Localized edema: Secondary | ICD-10-CM | POA: Insufficient documentation

## 2021-01-25 DIAGNOSIS — M25662 Stiffness of left knee, not elsewhere classified: Secondary | ICD-10-CM | POA: Diagnosis present

## 2021-01-25 DIAGNOSIS — R2689 Other abnormalities of gait and mobility: Secondary | ICD-10-CM | POA: Insufficient documentation

## 2021-01-25 DIAGNOSIS — M25562 Pain in left knee: Secondary | ICD-10-CM | POA: Diagnosis present

## 2021-01-25 MED ORDER — TRAMADOL HCL 50 MG PO TABS: 50.0000 mg | ORAL_TABLET | Freq: Four times a day (QID) | ORAL | 0 refills | Status: DC | PRN

## 2021-01-25 NOTE — Therapy (Signed)
[facility] [address] [place], [place], [address] Phone: [phone]   Fax:  [phone]  Physical Therapy Evaluation  Patient Details  Name: Terri Estrada MRN: [id number] Date of Birth: 04-30-1951 Referring Provider (PT): Leveda Anna PA-C   Encounter Date: 01/25/2021   PT End of Session - 01/25/21 1203    Visit Number 1    Number of Visits 16    Date for PT Re-Evaluation 03/22/21    Authorization Type UHC MCR/MCD    PT Start Time 1145    PT Stop Time 1230    PT Time Calculation (min) 45 min    Activity Tolerance Patient tolerated treatment well    Behavior During Therapy [facility] for tasks assessed/performed           Past Medical History:  Diagnosis Date  . Arthritis   . CAD in native artery    a. MI 2001 s/p PTCA/stenting to mid-distal junction of RCA - residual dx treated medically.  . Depression    pt reports having it intermittently  . Diverticulosis of colon (without mention of hemorrhage)   . Essential hypertension, benign   . MI (myocardial infarction) (HCC)   . Mild pulmonary hypertension (HCC)   . Obesity, unspecified   . Onychia and paronychia of toe   . Osteoarthrosis, unspecified whether generalized or localized, unspecified site   . Other and unspecified hyperlipidemia   . PONV (postoperative nausea and vomiting)   . Sickle-cell trait (HCC)   . Sinus bradycardia    a. HR 40s even on low dose metoprolol - BB stopped with resolution.  . Streptococcal sore throat   . Type II or unspecified type diabetes mellitus without mention of complication, not stated as uncontrolled     Past Surgical History:  Procedure Laterality Date  . ANGIOPLASTY  09/21/00  . CARDIAC CATHETERIZATION    . JOINT REPLACEMENT    . TOTAL KNEE ARTHROPLASTY Right 02/23/2020   Procedure: RIGHT TOTAL KNEE ARTHROPLASTY;  Surgeon: Tarry Kos, MD;  Location: [facility] OR;  Service: Orthopedics;  Laterality: Right;  . TOTAL KNEE  ARTHROPLASTY Left 12/27/2020   Procedure: LEFT TOTAL KNEE ARTHROPLASTY;  Surgeon: Tarry Kos, MD;  Location: [facility] OR;  Service: Orthopedics;  Laterality: Left;    There were no vitals filed for this visit.    Subjective Assessment - 01/25/21 1151    Subjective Patient had a left TKA on 12/27/2020. She had her other knee replaced last year in October. She feels like this one is swelling more then the other knee. She had [facility] for about 43 weeks. She is using a walker outside the house. She is using the cane and walker inside the house.    Pertinent History DMII, dperession, prior MI 2021,    How long can you sit comfortably? Stiffens when she sits    How long can you stand comfortably? Patient can stand 5-8 minutes before it ggives her a problem    How long can you walk comfortably? limited community distances with her walker    Diagnostic tests nothing post-op    Patient Stated Goals Get back to walking    Currently in Pain? Yes    Pain Score 8     Pain Location Knee    Pain Orientation Left    Pain Descriptors / Indicators Aching    Pain Type Surgical pain    Pain Onset More than a month ago    Pain Frequency  Constant    Aggravating Factors  standing, walking, rain    Pain Relieving Factors ice, Pills    Effect of Pain on Daily Activities difficulty perfroming ADL's              [facility] PT Assessment - 01/25/21 0001      Assessment   Medical Diagnosis Left TKA    Referring Provider (PT) Leveda Anna PA-C    Onset Date/Surgical Date 12/27/20    Hand Dominance Right    Next MD Visit 01/09/2021    Prior Therapy For her right knee      Precautions   Precautions None      Restrictions   Weight Bearing Restrictions No      Balance Screen   Has the patient fallen in the past 6 months No    Has the patient had a decrease in activity level because of a fear of falling?  No    Is the patient reluctant to leave their home because of a fear of falling?  No      Home  Environment   Additional Comments 9 steps into the house      Prior Function   Level of Independence Independent    Vocation Retired    Leisure walking outside      [employer]   Overall Cognitive Status Within Functional Limits for tasks assessed    Attention Focused    Focused Attention Appears intact    Memory Appears intact    Awareness Appears intact    Problem Solving Appears intact      Observation/Other Assessments   Skin Integrity well healing surgical scar    Focus on Therapeutic Outcomes (FOTO)  53% ability 71% expected in 12 visits      Observation/Other Assessments-Edema    Edema --   edeam visually noted on the left compared to right     Sensation   Light Touch Appears Intact    Additional Comments mild numbness around the knee      Coordination   Gross Motor Movements are Fluid and Coordinated Yes    Fine Motor Movements are Fluid and Coordinated Yes      ROM / Strength   AROM / PROM / Strength AROM;PROM;Strength      AROM   AROM Assessment Site Knee    Right/Left Knee Right;Left    Left Knee Extension -7    Left Knee Flexion 92      PROM   PROM Assessment Site Knee    Right/Left Knee Right;Left    Right Knee Extension -4    Right Knee Flexion 96      Strength   Strength Assessment Site Hip;Knee    Right/Left Hip Right    Right Hip Flexion 4/5    Right Hip ABduction 4/5    Right Hip ADduction 4/5    Right/Left Knee Right    Right Knee Flexion 4/5    Right Knee Extension 4/5      Palpation   Patella mobility excellent patella mobility    Palpation comment no unexpected tenderness to palpation      Transfers   Comments slow but safe sit to stand transfer after sitting      Ambulation/Gait   Gait Comments with walker: flexed trunk; decreased hip flexion; hip abduction; decreased left single leg stance; Without walker: mild buckling. Patient advised to progress to a cane first  Objective measurements  completed on examination: See above findings.       [facility] Adult PT Treatment/Exercise - 01/25/21 0001      Exercises   Exercises Knee/Hip      Knee/Hip Exercises: Stretches   Other Knee/Hip Stretches knee extension stretch 5x 5 sec hold; knee flexion stretch 3x 10 sec hold      Knee/Hip Exercises: Supine   Quad Sets Limitations quad set 3x 5 sec hold                  PT Education - 01/25/21 1443    Education Details reviewed exercises to focus on at home; swelling management    Person(s) Educated Patient    Methods Explanation;Demonstration;Tactile cues;Verbal cues    Comprehension Verbalized understanding;Returned demonstration;Verbal cues required;Tactile cues required            PT Short Term Goals - 01/25/21 1455      PT SHORT TERM GOAL #1   Title pt to be I with intial HEP    Time 4    Period Weeks    Status New    Target Date 02/22/21      PT SHORT TERM GOAL #2   Title Patient will demonstrate full extension    Time 4    Period Weeks    Status New      PT SHORT TERM GOAL #3   Title Patient will improve knee flexion to 110 degrees with mild pain    Time 4    Status New    Target Date 02/22/21      PT SHORT TERM GOAL #4   Title Therapy will review FOTO with paitient    Time 1    Period Weeks    Status New    Target Date 02/01/21             PT Long Term Goals - 01/25/21 1457      PT LONG TERM GOAL #1   Title Patient will walk 1 mile without reports of pain    Time 8    Period Weeks    Status New    Target Date 03/22/21      PT LONG TERM GOAL #2   Title Patient will go up/down 9 steps without pain    Time 8    Period Weeks    Status New    Target Date 03/22/21      PT LONG TERM GOAL #3   Title Patient will bend down to pick object up off the floor safely and without increased pain    Time 8    Period Weeks    Status New    Target Date 03/22/21                  Plan - 01/25/21 1204    Clinical Impression Statement  Patient is a 70 year old female S/P left tka on 12/27/2020. She presents with expected limitations in motion, strength, and functional mobility. Her pain flutuates but for the most part is under control. her C/O is tightness when she is up and moving. She has expected swelling in the left compared to the right. She would benefit from skilled therapy to improve ability to ambualte and perfrom IADL's.    Personal Factors and Comorbidities Comorbidity 1;Comorbidity 2    Comorbidities right knee replacement; DMII,    Examination-Activity Limitations Sit;Squat;Stairs;Stand;Lift;Locomotion Level    Examination-Participation Restrictions Community Activity;Yard Work;Shop    Conservation officer, historic buildings  Evolving/Moderate complexity    Clinical Decision Making Low    Rehab Potential Excellent    PT Frequency 2x / week    PT Duration 8 weeks    PT Treatment/Interventions ADLs/Self Care Home Management;Electrical Stimulation;Cryotherapy;Iontophoresis 4mg /ml Dexamethasone;Moist Heat;Gait training;Stair training;Functional mobility training;Therapeutic activities;Therapeutic exercise;Balance training;Neuromuscular re-education;Patient/family education;Manual techniques;Taping;Passive range of motion    PT Next Visit Plan manual therapy to improve flexion and extension; cane training; supine seated exeercises with a progression to standing exercises.    PT Home Exercise Plan reviewed quad sets, knee extension stretch; knee flexion stretch    Consulted and Agree with Plan of Care Patient           Patient will benefit from skilled therapeutic intervention in order to improve the following deficits and impairments:  Abnormal gait,Decreased range of motion,Difficulty walking,Pain,Decreased strength,Decreased skin integrity,Decreased activity tolerance,Increased muscle spasms,Decreased endurance,Decreased safety awareness,Increased edema,Decreased mobility  Visit Diagnosis: Acute pain of left  knee  Stiffness of left knee, not elsewhere classified  Other abnormalities of gait and mobility  Localized edema     Problem List Patient Active Problem List   Diagnosis Date Noted  . Status post total left knee replacement 12/27/2020  . Primary osteoarthritis of left knee 12/26/2020  . Fatigue 06/18/2020  . Status post total right knee replacement 02/23/2020  . Acute seasonal allergic rhinitis 12/19/2019  . Pre-op evaluation 10/23/2019  . Murmur 12/10/2015  . Pes planus of both feet 01/07/2015  . Depression 01/07/2015  . Female cystocele 06/26/2014  . Healthcare maintenance 12/17/2012  . Osteoarthritis 06/21/2010  . Hyperlipidemia 05/31/2009  . DIVERTICULOSIS, COLON 10/16/2008  . AORTIC STENOSIS, MILD 06/02/2008  . Diabetes mellitus type 2, controlled, without complications (HCC) 12/06/2006  . Obesity (BMI 30-39.9) 12/06/2006  . SICKLE CELL TRAIT 12/06/2006  . HYPERTENSION, BENIGN SYSTEMIC 12/06/2006  . CORONARY, ARTERIOSCLEROSIS 12/06/2006    Dessie Comaavid J Spiros Greenfeld PT DPT  01/25/2021, 3:01 PM  [facility] [address] [place], [place], [address] Phone: [phone]   Fax:  [phone]  Name: Terri Estrada MRN: [id number] Date of Birth: [birth year]

## 2021-01-25 NOTE — Telephone Encounter (Signed)
done

## 2021-01-25 NOTE — Telephone Encounter (Signed)
Pt was called and informed

## 2021-01-25 NOTE — Patient Instructions (Signed)
Access Code: [email or web address]: [email or web address]: 04/19/2022Prepared by: Onalee Hua CarrollExercises  Supine Knee Extension Stretch on Towel Roll - 1 x daily - 7 x weekly - 1 sets - 5 reps - 10 sec hold  Supine Heel Slide with Strap - 1 x daily - 7 x weekly - 3 sets - 5 reps - 10 sec hold hold  Supine Quad Set - 1 x daily - 7 x weekly - 3 sets - 10 reps

## 2021-01-28 ENCOUNTER — Ambulatory Visit: Payer: Medicare Other | Admitting: Physical Therapy

## 2021-01-28 ENCOUNTER — Other Ambulatory Visit: Payer: Self-pay

## 2021-01-28 DIAGNOSIS — M25562 Pain in left knee: Secondary | ICD-10-CM | POA: Diagnosis not present

## 2021-01-28 DIAGNOSIS — M25662 Stiffness of left knee, not elsewhere classified: Secondary | ICD-10-CM

## 2021-01-28 DIAGNOSIS — R600 Localized edema: Secondary | ICD-10-CM

## 2021-01-28 DIAGNOSIS — R2689 Other abnormalities of gait and mobility: Secondary | ICD-10-CM

## 2021-01-28 NOTE — Therapy (Signed)
[facility] [address] [place], [place], [address] Phone: [phone]   Fax:  [phone]  Physical Therapy Treatment  Patient Details  Name: Terri Estrada MRN: [id number] Date of Birth: 1951-03-13 Referring Provider (PT): Leveda Anna PA-C   Encounter Date: 01/28/2021   PT End of Session - 01/28/21 1231    Visit Number 2    Number of Visits 16    Date for PT Re-Evaluation 03/22/21    Authorization Type UHC MCR/MCD    PT Start Time 1228    PT Stop Time 1316    PT Time Calculation (min) 48 min    Activity Tolerance Patient tolerated treatment well    Behavior During Therapy [facility] for tasks assessed/performed           Past Medical History:  Diagnosis Date  . Arthritis   . CAD in native artery    a. MI 2001 s/p PTCA/stenting to mid-distal junction of RCA - residual dx treated medically.  . Depression    pt reports having it intermittently  . Diverticulosis of colon (without mention of hemorrhage)   . Essential hypertension, benign   . MI (myocardial infarction) (HCC)   . Mild pulmonary hypertension (HCC)   . Obesity, unspecified   . Onychia and paronychia of toe   . Osteoarthrosis, unspecified whether generalized or localized, unspecified site   . Other and unspecified hyperlipidemia   . PONV (postoperative nausea and vomiting)   . Sickle-cell trait (HCC)   . Sinus bradycardia    a. HR 40s even on low dose metoprolol - BB stopped with resolution.  . Streptococcal sore throat   . Type II or unspecified type diabetes mellitus without mention of complication, not stated as uncontrolled     Past Surgical History:  Procedure Laterality Date  . ANGIOPLASTY  09/21/00  . CARDIAC CATHETERIZATION    . JOINT REPLACEMENT    . TOTAL KNEE ARTHROPLASTY Right 02/23/2020   Procedure: RIGHT TOTAL KNEE ARTHROPLASTY;  Surgeon: Tarry Kos, MD;  Location: [facility] OR;  Service: Orthopedics;  Laterality: Right;  . TOTAL KNEE  ARTHROPLASTY Left 12/27/2020   Procedure: LEFT TOTAL KNEE ARTHROPLASTY;  Surgeon: Tarry Kos, MD;  Location: [facility] OR;  Service: Orthopedics;  Laterality: Left;    There were no vitals filed for this visit.   Subjective Assessment - 01/28/21 1232    Subjective "I am doing better I think. I did take some meds for pain which today I am at a 2/10.The exercise are going good"    Currently in Pain? Yes    Pain Score 2    last took meds for pain at 10am   Pain Location Knee    Pain Orientation Left    Pain Descriptors / Indicators Aching    Pain Type Chronic pain    Pain Onset More than a month ago    Pain Frequency Intermittent              [facility] PT Assessment - 01/28/21 0001      Assessment   Medical Diagnosis Left TKA    Referring Provider (PT) Leveda Anna PA-C      AROM   Left Knee Extension -6    Left Knee Flexion 93                         [facility] Adult PT Treatment/Exercise - 01/28/21 0001      Knee/Hip Exercises:  Stretches   Active Hamstring Stretch 2 reps;30 seconds    Quad Stretch 2 reps;30 seconds   thomas stretch position     Knee/Hip Exercises: Aerobic   Recumbent Bike backward full revolutions x 5 min      Knee/Hip Exercises: Supine   Quad Sets Strengthening;2 sets;10 reps   with rolled towel in popliteal space to promote quad activation   Bridges 2 sets;10 reps    Straight Leg Raises 2 sets;10 reps      Modalities   Modalities Vasopneumatic      Vasopneumatic   Number Minutes Vasopneumatic  10 minutes    Vasopnuematic Location  Knee    Vasopneumatic Pressure Medium    Vasopneumatic Temperature  34      Manual Therapy   Manual Therapy Joint mobilization    Joint Mobilization tibiofemoral PA/ AP grade III                    PT Short Term Goals - 01/25/21 1455      PT SHORT TERM GOAL #1   Title pt to be I with intial HEP    Time 4    Period Weeks    Status New    Target Date 02/22/21      PT SHORT TERM GOAL #2    Title Patient will demonstrate full extension    Time 4    Period Weeks    Status New      PT SHORT TERM GOAL #3   Title Patient will improve knee flexion to 110 degrees with mild pain    Time 4    Status New    Target Date 02/22/21      PT SHORT TERM GOAL #4   Title Therapy will review FOTO with paitient    Time 1    Period Weeks    Status New    Target Date 02/01/21             PT Long Term Goals - 01/25/21 1457      PT LONG TERM GOAL #1   Title Patient will walk 1 mile without reports of pain    Time 8    Period Weeks    Status New    Target Date 03/22/21      PT LONG TERM GOAL #2   Title Patient will go up/down 9 steps without pain    Time 8    Period Weeks    Status New    Target Date 03/22/21      PT LONG TERM GOAL #3   Title Patient will bend down to pick object up off the floor safely and without increased pain    Time 8    Period Weeks    Status New    Target Date 03/22/21                 Plan - 01/28/21 1303    Clinical Impression Statement pt notes being consistent with her HEP. cotninued working on knee ROM whci hshe increased 1 degree inboth extension and flexion. she was able to get full revolutions backward on the bike, and was able ot perform all exercises. utilized vaso end of session to calm down pain and swelling.    PT Treatment/Interventions ADLs/Self Care Home Management;Electrical Stimulation;Cryotherapy;Iontophoresis 4mg /ml Dexamethasone;Moist Heat;Gait training;Stair training;Functional mobility training;Therapeutic activities;Therapeutic exercise;Balance training;Neuromuscular re-education;Patient/family education;Manual techniques;Taping;Passive range of motion    PT Next Visit Plan manual therapy to improve flexion and extension; cane  training; supine seated exeercises with a progression to standing exercises. vaso for pain/ swelling PRN    PT Home Exercise Plan reviewed quad sets, knee extension stretch; knee flexion stretch     Consulted and Agree with Plan of Care Patient           Patient will benefit from skilled therapeutic intervention in order to improve the following deficits and impairments:  Abnormal gait,Decreased range of motion,Difficulty walking,Pain,Decreased strength,Decreased skin integrity,Decreased activity tolerance,Increased muscle spasms,Decreased endurance,Decreased safety awareness,Increased edema,Decreased mobility  Visit Diagnosis: Acute pain of left knee  Stiffness of left knee, not elsewhere classified  Other abnormalities of gait and mobility  Localized edema     Problem List Patient Active Problem List   Diagnosis Date Noted  . Status post total left knee replacement 12/27/2020  . Primary osteoarthritis of left knee 12/26/2020  . Fatigue 06/18/2020  . Status post total right knee replacement 02/23/2020  . Acute seasonal allergic rhinitis 12/19/2019  . Pre-op evaluation 10/23/2019  . Murmur 12/10/2015  . Pes planus of both feet 01/07/2015  . Depression 01/07/2015  . Female cystocele 06/26/2014  . Healthcare maintenance 12/17/2012  . Osteoarthritis 06/21/2010  . Hyperlipidemia 05/31/2009  . DIVERTICULOSIS, COLON 10/16/2008  . AORTIC STENOSIS, MILD 06/02/2008  . Diabetes mellitus type 2, controlled, without complications (HCC) 12/06/2006  . Obesity (BMI 30-39.9) 12/06/2006  . SICKLE CELL TRAIT 12/06/2006  . HYPERTENSION, BENIGN SYSTEMIC 12/06/2006  . CORONARY, ARTERIOSCLEROSIS 12/06/2006    Lulu Riding PT, DPT, LAT, ATC  01/28/21  1:05 PM      [facility] [address] [place], [place], [address] Phone: [phone]   Fax:  [phone]  Name: Terri Estrada MRN: [id number] Date of Birth: [birth year]

## 2021-01-29 ENCOUNTER — Other Ambulatory Visit: Payer: Self-pay | Admitting: Physician Assistant

## 2021-02-01 ENCOUNTER — Other Ambulatory Visit: Payer: Self-pay | Admitting: Physician Assistant

## 2021-02-08 ENCOUNTER — Encounter: Payer: Self-pay | Admitting: Orthopaedic Surgery

## 2021-02-08 ENCOUNTER — Ambulatory Visit: Payer: Self-pay

## 2021-02-08 ENCOUNTER — Ambulatory Visit (INDEPENDENT_AMBULATORY_CARE_PROVIDER_SITE_OTHER): Payer: Medicare Other | Admitting: Orthopaedic Surgery

## 2021-02-08 ENCOUNTER — Other Ambulatory Visit: Payer: Self-pay

## 2021-02-08 DIAGNOSIS — Z96652 Presence of left artificial knee joint: Secondary | ICD-10-CM

## 2021-02-08 DIAGNOSIS — Z96651 Presence of right artificial knee joint: Secondary | ICD-10-CM | POA: Diagnosis not present

## 2021-02-08 MED ORDER — METHOCARBAMOL 500 MG PO TABS
500.0000 mg | ORAL_TABLET | Freq: Two times a day (BID) | ORAL | 2 refills | Status: DC | PRN
Start: 2021-02-08 — End: 2021-08-31

## 2021-02-08 NOTE — Progress Notes (Signed)
Office Visit Note   Patient: Terri Estrada           Date of Birth: 1951/08/06           MRN: [id number] Visit Date: 02/08/2021              Requested by: Ronnald Ramp, MD [address] [place],  [place] [address] PCP: Ronnald Ramp, MD   Assessment & Plan: Visit Diagnoses:  1. Hx of total knee replacement, left   2. Status post total right knee replacement   3. Status post total left knee replacement     Plan: In terms of the right knee we can see her back in another year with two-view x-rays at that time.  For the left knee she will continue with outpatient PT.  She may discontinue aspirin.  We refilled the Robaxin.  Recheck in 3 months with two-view x-rays of the left knee.  Follow-Up Instructions: Return in about 3 months (around 05/11/2021).   Orders:  Orders Placed This Encounter  Procedures  . XR KNEE 3 VIEW LEFT   Meds ordered this encounter  Medications  . methocarbamol (ROBAXIN) 500 MG tablet    Sig: Take 1 tablet (500 mg total) by mouth 2 (two) times daily as needed. To be taken after surgery    Dispense:  30 tablet    Refill:  2      Procedures: No procedures performed   Clinical Data: No additional findings.   Subjective: Chief Complaint  Patient presents with  . Right Knee - Pain    Terri Estrada is approximately 69-month status post left total knee replacement and approximately 1 year status post right total knee replacement.  She has no complaints in regards to the right knee replacement.  For the left knee she continues to do physical therapy at the [facility] location.  She is ambulating with a cane.  Also doing home exercises.  She would like a refill on Robaxin today.  She has no real complaints regarding the left knee.  Takes tramadol as needed.   Review of Systems   Objective: Vital Signs: There were no vitals taken for this visit.  Physical Exam  Ortho Exam Right knee shows a fully healed surgical scar.   Range of motion 0 to 105.  Collaterals are stable. Left knee shows a fully healed surgical scar.  Range of motion 0-95.  Collaterals are stable. Specialty Comments:  No specialty comments available.  Imaging: XR KNEE 3 VIEW LEFT  Result Date: 02/08/2021 Status post bilateral total knee replacements.  No complications with the prostheses.    PMFS History: Patient Active Problem List   Diagnosis Date Noted  . Status post total left knee replacement 12/27/2020  . Primary osteoarthritis of left knee 12/26/2020  . Fatigue 06/18/2020  . Status post total right knee replacement 02/23/2020  . Acute seasonal allergic rhinitis 12/19/2019  . Pre-op evaluation 10/23/2019  . Murmur 12/10/2015  . Pes planus of both feet 01/07/2015  . Depression 01/07/2015  . Female cystocele 06/26/2014  . Healthcare maintenance 12/17/2012  . Osteoarthritis 06/21/2010  . Hyperlipidemia 05/31/2009  . DIVERTICULOSIS, COLON 10/16/2008  . AORTIC STENOSIS, MILD 06/02/2008  . Diabetes mellitus type 2, controlled, without complications (HCC) 12/06/2006  . Obesity (BMI 30-39.9) 12/06/2006  . SICKLE CELL TRAIT 12/06/2006  . HYPERTENSION, BENIGN SYSTEMIC 12/06/2006  . CORONARY, ARTERIOSCLEROSIS 12/06/2006   Past Medical History:  Diagnosis Date  . Arthritis   . CAD in native artery  a. MI 2001 s/p PTCA/stenting to mid-distal junction of RCA - residual dx treated medically.  . Depression    pt reports having it intermittently  . Diverticulosis of colon (without mention of hemorrhage)   . Essential hypertension, benign   . MI (myocardial infarction) (HCC)   . Mild pulmonary hypertension (HCC)   . Obesity, unspecified   . Onychia and paronychia of toe   . Osteoarthrosis, unspecified whether generalized or localized, unspecified site   . Other and unspecified hyperlipidemia   . PONV (postoperative nausea and vomiting)   . Sickle-cell trait (HCC)   . Sinus bradycardia    a. HR 40s even on low dose metoprolol  - BB stopped with resolution.  . Streptococcal sore throat   . Type II or unspecified type diabetes mellitus without mention of complication, not stated as uncontrolled     Family History  Problem Relation Age of Onset  . Sickle cell anemia Daughter   . Hypertension Mother   . Diabetes type II Mother   . Diabetes type II Sister   . Breast cancer Sister   . Cancer Other   . Stroke Other   . Diabetes Other     Past Surgical History:  Procedure Laterality Date  . ANGIOPLASTY  09/21/00  . CARDIAC CATHETERIZATION    . JOINT REPLACEMENT    . TOTAL KNEE ARTHROPLASTY Right 02/23/2020   Procedure: RIGHT TOTAL KNEE ARTHROPLASTY;  Surgeon: Tarry Kos, MD;  Location: [facility] OR;  Service: Orthopedics;  Laterality: Right;  . TOTAL KNEE ARTHROPLASTY Left 12/27/2020   Procedure: LEFT TOTAL KNEE ARTHROPLASTY;  Surgeon: Tarry Kos, MD;  Location: [facility] OR;  Service: Orthopedics;  Laterality: Left;   Social History   Occupational History  . Occupation: DAY Associate Professor: SELF-EMPLOYED  Tobacco Use  . Smoking status: Former Games developer  . Smokeless tobacco: Never Used  . Tobacco comment: quit nov 2001  Vaping Use  . Vaping Use: Never used  Substance and Sexual Activity  . Alcohol use: No  . Drug use: No  . Sexual activity: Yes    Birth control/protection: Post-menopausal

## 2021-02-11 ENCOUNTER — Encounter: Payer: Self-pay | Admitting: Physical Therapy

## 2021-02-11 ENCOUNTER — Other Ambulatory Visit: Payer: Self-pay

## 2021-02-11 ENCOUNTER — Ambulatory Visit: Payer: Medicare Other | Attending: Physician Assistant | Admitting: Physical Therapy

## 2021-02-11 DIAGNOSIS — M25562 Pain in left knee: Secondary | ICD-10-CM | POA: Diagnosis present

## 2021-02-11 DIAGNOSIS — M6281 Muscle weakness (generalized): Secondary | ICD-10-CM

## 2021-02-11 DIAGNOSIS — R600 Localized edema: Secondary | ICD-10-CM

## 2021-02-11 DIAGNOSIS — M25662 Stiffness of left knee, not elsewhere classified: Secondary | ICD-10-CM

## 2021-02-11 DIAGNOSIS — R2689 Other abnormalities of gait and mobility: Secondary | ICD-10-CM | POA: Diagnosis present

## 2021-02-11 DIAGNOSIS — R269 Unspecified abnormalities of gait and mobility: Secondary | ICD-10-CM | POA: Diagnosis present

## 2021-02-11 NOTE — Therapy (Signed)
[facility]patient Rehabilitation [facility] [address] [place], [place], [address] Phone: [phone]   Fax:  [phone]  Physical Therapy Treatment  Patient Details  Name: Terri Estrada MRN: [id number] Date of Birth: 22-Apr-1951 Referring Provider (PT): Leveda Anna PA-C   Encounter Date: 02/11/2021   PT End of Session - 02/11/21 1254    Visit Number 3    Number of Visits 16    Date for PT Re-Evaluation 03/22/21    Authorization Type UHC MCR/MCD    PT Start Time 1100    PT Stop Time 1151   ice following treatment   PT Time Calculation (min) 51 min    Activity Tolerance Patient tolerated treatment well    Behavior During Therapy [facility] for tasks assessed/performed           Past Medical History:  Diagnosis Date  . Arthritis   . CAD in native artery    a. MI 2001 s/p PTCA/stenting to mid-distal junction of RCA - residual dx treated medically.  . Depression    pt reports having it intermittently  . Diverticulosis of colon (without mention of hemorrhage)   . Essential hypertension, benign   . MI (myocardial infarction) (HCC)   . Mild pulmonary hypertension (HCC)   . Obesity, unspecified   . Onychia and paronychia of toe   . Osteoarthrosis, unspecified whether generalized or localized, unspecified site   . Other and unspecified hyperlipidemia   . PONV (postoperative nausea and vomiting)   . Sickle-cell trait (HCC)   . Sinus bradycardia    a. HR 40s even on low dose metoprolol - BB stopped with resolution.  . Streptococcal sore throat   . Type II or unspecified type diabetes mellitus without mention of complication, not stated as uncontrolled     Past Surgical History:  Procedure Laterality Date  . ANGIOPLASTY  09/21/00  . CARDIAC CATHETERIZATION    . JOINT REPLACEMENT    . TOTAL KNEE ARTHROPLASTY Right 02/23/2020   Procedure: RIGHT TOTAL KNEE ARTHROPLASTY;  Surgeon: Tarry Kos, MD;  Location: [facility] OR;  Service: Orthopedics;  Laterality:  Right;  . TOTAL KNEE ARTHROPLASTY Left 12/27/2020   Procedure: LEFT TOTAL KNEE ARTHROPLASTY;  Surgeon: Tarry Kos, MD;  Location: [facility] OR;  Service: Orthopedics;  Laterality: Left;    There were no vitals filed for this visit.   Subjective Assessment - 02/11/21 1102    Subjective Pt reports she is doing well she has no pain but is having some soreness.    Currently in Pain? No/denies    Pain Score 0-No pain              [facility] PT Assessment - 02/11/21 0001      AROM   Left Knee Extension -4   -3 following treatment   Left Knee Flexion 102                         [facility] Adult PT Treatment/Exercise - 02/11/21 0001      Knee/Hip Exercises: Stretches   Active Hamstring Stretch 2 reps;30 seconds;Left    Quad Stretch 2 reps;30 seconds   thomas stretch position     Knee/Hip Exercises: Aerobic   Recumbent Bike backward full revolutions x 5 min      Knee/Hip Exercises: Supine   Quad Sets Strengthening;10 reps;3 sets   1 set with rolled towel in popliteal space to promote quad activation, 2 set with towel under ankle  Bridges 2 sets;10 reps    Straight Leg Raises 10 reps;3 sets   cues for starting with quad set     Modalities   Modalities Cryotherapy      Cryotherapy   Number Minutes Cryotherapy 10 Minutes    Cryotherapy Location Knee    Type of Cryotherapy Ice pack      Manual Therapy   Manual Therapy Joint mobilization    Joint Mobilization tibiofemoral PA/ AP grade III, patellofemoral                    PT Short Term Goals - 02/11/21 1248      PT SHORT TERM GOAL #1   Title pt to be I with intial HEP    Time 4    Period Weeks    Status On-going    Target Date 02/22/21      PT SHORT TERM GOAL #2   Title Patient will demonstrate full extension    Baseline knee extenion at -4 prior to treatment and -3 following treatment    Time 4    Period Weeks    Status On-going    Target Date 04/09/20      PT SHORT TERM GOAL #3   Title Patient  will improve knee flexion to 110 degrees with mild pain    Baseline knee flexion 102 degrees    Time 4    Period Weeks    Status On-going      PT SHORT TERM GOAL #4   Title Therapy will review FOTO with paitient    Baseline reviewed today 51% function    Time 1    Period Weeks    Status Achieved    Target Date 02/01/21             PT Long Term Goals - 01/25/21 1457      PT LONG TERM GOAL #1   Title Patient will walk 1 mile without reports of pain    Time 8    Period Weeks    Status New    Target Date 03/22/21      PT LONG TERM GOAL #2   Title Patient will go up/down 9 steps without pain    Time 8    Period Weeks    Status New    Target Date 03/22/21      PT LONG TERM GOAL #3   Title Patient will bend down to pick object up off the floor safely and without increased pain    Time 8    Period Weeks    Status New    Target Date 03/22/21                 Plan - 02/11/21 1249    Clinical Impression Statement Pt arrives with some soreness in her knee. She continued to work on knee ROM which has increased to -4 degrees knee extension prior to treatment and -3 degrees knee extension following treatment and 102 degrees knee flexion prior to treatment. Joint mobilizations were perfomed to continue to increase her ROM. PT was able to complete all exercises without any increase in pain and reported feeling well after treatment. Pt recieved ice to help with any soreness following treatment.    PT Treatment/Interventions ADLs/Self Care Home Management;Electrical Stimulation;Cryotherapy;Iontophoresis 4mg /ml Dexamethasone;Moist Heat;Gait training;Stair training;Functional mobility training;Therapeutic activities;Therapeutic exercise;Balance training;Neuromuscular re-education;Patient/family education;Manual techniques;Taping;Passive range of motion    PT Next Visit Plan manual therapy to improve flexion and extension; cane training; supine  seated exeercises with a progression to  standing exercises. vaso for pain/ swelling PRN    PT Home Exercise Plan reviewed quad sets, knee extension stretch; knee flexion stretch           Patient will benefit from skilled therapeutic intervention in order to improve the following deficits and impairments:  Abnormal gait,Decreased range of motion,Difficulty walking,Pain,Decreased strength,Decreased skin integrity,Decreased activity tolerance,Increased muscle spasms,Decreased endurance,Decreased safety awareness,Increased edema,Decreased mobility  Visit Diagnosis: Acute pain of left knee  Stiffness of left knee, not elsewhere classified  Other abnormalities of gait and mobility  Localized edema  Chronic pain of right knee  Stiffness of right knee, not elsewhere classified  Abnormal gait  Muscle weakness (generalized)     Problem List Patient Active Problem List   Diagnosis Date Noted  . Status post total left knee replacement 12/27/2020  . Primary osteoarthritis of left knee 12/26/2020  . Fatigue 06/18/2020  . Status post total right knee replacement 02/23/2020  . Acute seasonal allergic rhinitis 12/19/2019  . Pre-op evaluation 10/23/2019  . Murmur 12/10/2015  . Pes planus of both feet 01/07/2015  . Depression 01/07/2015  . Female cystocele 06/26/2014  . Healthcare maintenance 12/17/2012  . Osteoarthritis 06/21/2010  . Hyperlipidemia 05/31/2009  . DIVERTICULOSIS, COLON 10/16/2008  . AORTIC STENOSIS, MILD 06/02/2008  . Diabetes mellitus type 2, controlled, without complications (HCC) 12/06/2006  . Obesity (BMI 30-39.9) 12/06/2006  . SICKLE CELL TRAIT 12/06/2006  . HYPERTENSION, BENIGN SYSTEMIC 12/06/2006  . CORONARY, ARTERIOSCLEROSIS 12/06/2006    Dorthula Perfect, SPTA 02/11/2021, 12:55 PM  [facility] [address] [place], [place], [address] Phone: [phone]   Fax:  [phone]  Name: Terri Estrada MRN: [id number] Date of Birth:  Jul 07, 1951

## 2021-02-15 ENCOUNTER — Other Ambulatory Visit: Payer: Self-pay | Admitting: Physician Assistant

## 2021-02-15 ENCOUNTER — Telehealth: Payer: Self-pay | Admitting: Orthopaedic Surgery

## 2021-02-15 ENCOUNTER — Ambulatory Visit: Payer: Medicare HMO | Admitting: Orthopaedic Surgery

## 2021-02-15 MED ORDER — TRAMADOL HCL 50 MG PO TABS: 50.0000 mg | ORAL_TABLET | Freq: Two times a day (BID) | ORAL | 0 refills | Status: DC | PRN

## 2021-02-15 NOTE — Telephone Encounter (Signed)
Called patient. Patient aware.

## 2021-02-15 NOTE — Telephone Encounter (Signed)
Patient called. She would like a refill on Tramadol.  

## 2021-02-15 NOTE — Telephone Encounter (Signed)
Sent in

## 2021-02-15 NOTE — Telephone Encounter (Signed)
If yes, please send into pharm. Thanks.  

## 2021-02-16 ENCOUNTER — Other Ambulatory Visit: Payer: Self-pay

## 2021-02-16 ENCOUNTER — Ambulatory Visit: Payer: Medicare Other | Admitting: Physical Therapy

## 2021-02-16 DIAGNOSIS — R600 Localized edema: Secondary | ICD-10-CM

## 2021-02-16 DIAGNOSIS — M25562 Pain in left knee: Secondary | ICD-10-CM

## 2021-02-16 DIAGNOSIS — M25662 Stiffness of left knee, not elsewhere classified: Secondary | ICD-10-CM

## 2021-02-16 DIAGNOSIS — R2689 Other abnormalities of gait and mobility: Secondary | ICD-10-CM

## 2021-02-16 NOTE — Therapy (Signed)
[facility] Outpatient Rehabilitation [facility] [address] [place], [place], [address] Phone: [phone]   Fax:  [phone]  Physical Therapy Treatment  Patient Details  Name: Terri Estrada MRN: [id number] Date of Birth: Feb 20, 1951 Referring Provider (PT): Leveda Anna PA-C   Encounter Date: 02/16/2021   PT End of Session - 02/16/21 1016    Visit Number 4    Number of Visits 16    Date for PT Re-Evaluation 03/22/21    PT Start Time 1017    PT Stop Time 1106    PT Time Calculation (min) 49 min    Activity Tolerance Patient tolerated treatment well    Behavior During Therapy [facility] for tasks assessed/performed           Past Medical History:  Diagnosis Date  . Arthritis   . CAD in native artery    a. MI 2001 s/p PTCA/stenting to mid-distal junction of RCA - residual dx treated medically.  . Depression    pt reports having it intermittently  . Diverticulosis of colon (without mention of hemorrhage)   . Essential hypertension, benign   . MI (myocardial infarction) (HCC)   . Mild pulmonary hypertension (HCC)   . Obesity, unspecified   . Onychia and paronychia of toe   . Osteoarthrosis, unspecified whether generalized or localized, unspecified site   . Other and unspecified hyperlipidemia   . PONV (postoperative nausea and vomiting)   . Sickle-cell trait (HCC)   . Sinus bradycardia    a. HR 40s even on low dose metoprolol - BB stopped with resolution.  . Streptococcal sore throat   . Type II or unspecified type diabetes mellitus without mention of complication, not stated as uncontrolled     Past Surgical History:  Procedure Laterality Date  . ANGIOPLASTY  09/21/00  . CARDIAC CATHETERIZATION    . JOINT REPLACEMENT    . TOTAL KNEE ARTHROPLASTY Right 02/23/2020   Procedure: RIGHT TOTAL KNEE ARTHROPLASTY;  Surgeon: Tarry Kos, MD;  Location: [facility] OR;  Service: Orthopedics;  Laterality: Right;  . TOTAL KNEE ARTHROPLASTY Left 12/27/2020    Procedure: LEFT TOTAL KNEE ARTHROPLASTY;  Surgeon: Tarry Kos, MD;  Location: [facility] OR;  Service: Orthopedics;  Laterality: Left;    There were no vitals filed for this visit.   Subjective Assessment - 02/16/21 1018    Subjective "no pain today, just continued stiffness in the knee."    Patient Stated Goals Get back to walking    Currently in Pain? No/denies    Aggravating Factors  weather,    Pain Relieving Factors ice, medication, exercise              [facility] PT Assessment - 02/16/21 0001      Assessment   Medical Diagnosis Left TKA    Referring Provider (PT) Leveda Anna PA-C      AROM   Left Knee Extension 4    Left Knee Flexion 103   113 following manual techniques                        [facility] Adult PT Treatment/Exercise - 02/16/21 0001      Knee/Hip Exercises: Stretches   Active Hamstring Stretch 2 reps;30 seconds;Left   with strap   Quad Stretch 2 reps;30 seconds   supine with stap   Quad Stretch Limitations thomas test position      Knee/Hip Exercises: Aerobic   Recumbent Bike L1 x 6 min  lowering seat every 2 min to promote knee flexion     Knee/Hip Exercises: Seated   Long Arc Quad 2 sets;10 reps;Strengthening;Left   3#   Knee/Hip Flexion knee flexion 2 x 10 with red theraband    Sit to Sand 2 sets;10 reps   2nd set with RLE advanced slightly for LLE activation     Knee/Hip Exercises: Supine   Straight Leg Raises 2 sets;10 reps   combined with sustained quad set     Vasopneumatic   Number Minutes Vasopneumatic  10 minutes    Vasopnuematic Location  Knee    Vasopneumatic Pressure Medium    Vasopneumatic Temperature  34      Manual Therapy   Joint Mobilization tibiofemoral PA/ AP grade III with active knee flexion between sets                    PT Short Term Goals - 02/11/21 1248      PT SHORT TERM GOAL #1   Title pt to be I with intial HEP    Time 4    Period Weeks    Status On-going    Target Date 02/22/21       PT SHORT TERM GOAL #2   Title Patient will demonstrate full extension    Baseline knee extenion at -4 prior to treatment and -3 following treatment    Time 4    Period Weeks    Status On-going    Target Date 04/09/20      PT SHORT TERM GOAL #3   Title Patient will improve knee flexion to 110 degrees with mild pain    Baseline knee flexion 102 degrees    Time 4    Period Weeks    Status On-going      PT SHORT TERM GOAL #4   Title Therapy will review FOTO with paitient    Baseline reviewed today 51% function    Time 1    Period Weeks    Status Achieved    Target Date 02/01/21             PT Long Term Goals - 01/25/21 1457      PT LONG TERM GOAL #1   Title Patient will walk 1 mile without reports of pain    Time 8    Period Weeks    Status New    Target Date 03/22/21      PT LONG TERM GOAL #2   Title Patient will go up/down 9 steps without pain    Time 8    Period Weeks    Status New    Target Date 03/22/21      PT LONG TERM GOAL #3   Title Patient will bend down to pick object up off the floor safely and without increased pain    Time 8    Period Weeks    Status New    Target Date 03/22/21                 Plan - 02/16/21 1040    Clinical Impression Statement Mrs Dung continues to make excellent progress with physical therapy to increasing knee ROM 4 - 113. continued working knee ROM with mobs and knee strengtheing which she she did  well with. end of session she noted some soreness which utilizied vaso to help with soreness.    PT Treatment/Interventions ADLs/Self Care Home Management;Electrical Stimulation;Cryotherapy;Iontophoresis 4mg /ml Dexamethasone;Moist Heat;Gait training;Stair training;Functional mobility training;Therapeutic activities;Therapeutic exercise;Balance training;Neuromuscular  re-education;Patient/family education;Manual techniques;Taping;Passive range of motion    PT Next Visit Plan manual therapy to improve flexion and extension; cane  training; seated exeercises with a progression to standing exercises. vaso for pain/ swelling PRN    PT Home Exercise Plan reviewed quad sets, knee extension stretch; knee flexion stretch    Consulted and Agree with Plan of Care Patient           Patient will benefit from skilled therapeutic intervention in order to improve the following deficits and impairments:  Abnormal gait,Decreased range of motion,Difficulty walking,Pain,Decreased strength,Decreased skin integrity,Decreased activity tolerance,Increased muscle spasms,Decreased endurance,Decreased safety awareness,Increased edema,Decreased mobility  Visit Diagnosis: Acute pain of left knee  Stiffness of left knee, not elsewhere classified  Other abnormalities of gait and mobility  Localized edema     Problem List Patient Active Problem List   Diagnosis Date Noted  . Status post total left knee replacement 12/27/2020  . Primary osteoarthritis of left knee 12/26/2020  . Fatigue 06/18/2020  . Status post total right knee replacement 02/23/2020  . Acute seasonal allergic rhinitis 12/19/2019  . Pre-op evaluation 10/23/2019  . Murmur 12/10/2015  . Pes planus of both feet 01/07/2015  . Depression 01/07/2015  . Female cystocele 06/26/2014  . Healthcare maintenance 12/17/2012  . Osteoarthritis 06/21/2010  . Hyperlipidemia 05/31/2009  . DIVERTICULOSIS, COLON 10/16/2008  . AORTIC STENOSIS, MILD 06/02/2008  . Diabetes mellitus type 2, controlled, without complications (HCC) 12/06/2006  . Obesity (BMI 30-39.9) 12/06/2006  . SICKLE CELL TRAIT 12/06/2006  . HYPERTENSION, BENIGN SYSTEMIC 12/06/2006  . CORONARY, ARTERIOSCLEROSIS 12/06/2006    Lulu Riding PT, DPT, LAT, ATC  02/16/21  10:54 AM      [facility] [address] [place], [place], [address] Phone: [phone]   Fax:  [phone]  Name: Bettejane Leavens MRN: [id number] Date of Birth: December 26, 1950

## 2021-02-18 ENCOUNTER — Other Ambulatory Visit: Payer: Self-pay

## 2021-02-18 ENCOUNTER — Ambulatory Visit: Payer: Medicare Other | Admitting: Physical Therapy

## 2021-02-18 ENCOUNTER — Encounter: Payer: Self-pay | Admitting: Physical Therapy

## 2021-02-18 DIAGNOSIS — M25562 Pain in left knee: Secondary | ICD-10-CM

## 2021-02-18 DIAGNOSIS — R2689 Other abnormalities of gait and mobility: Secondary | ICD-10-CM

## 2021-02-18 DIAGNOSIS — M25662 Stiffness of left knee, not elsewhere classified: Secondary | ICD-10-CM

## 2021-02-18 DIAGNOSIS — R600 Localized edema: Secondary | ICD-10-CM

## 2021-02-18 NOTE — Therapy (Signed)
[facility] Outpatient Rehabilitation [facility] [address] [place], [place], [address] Phone: [phone]   Fax:  [phone]  Physical Therapy Treatment  Patient Details  Name: Terri Estrada MRN: [id number] Date of Birth: [birth year] Referring Provider (PT): Leveda Anna PA-C   Encounter Date: 02/18/2021   PT End of Session - 02/18/21 1100    Visit Number 5    Number of Visits 16    Date for PT Re-Evaluation 03/22/21    Authorization Type UHC MCR/MCD    PT Start Time 1100    PT Stop Time 1142    PT Time Calculation (min) 42 min    Activity Tolerance Patient tolerated treatment well    Behavior During Therapy [facility] for tasks assessed/performed           Past Medical History:  Diagnosis Date  . Arthritis   . CAD in native artery    a. MI 2001 s/p PTCA/stenting to mid-distal junction of RCA - residual dx treated medically.  . Depression    pt reports having it intermittently  . Diverticulosis of colon (without mention of hemorrhage)   . Essential hypertension, benign   . MI (myocardial infarction) (HCC)   . Mild pulmonary hypertension (HCC)   . Obesity, unspecified   . Onychia and paronychia of toe   . Osteoarthrosis, unspecified whether generalized or localized, unspecified site   . Other and unspecified hyperlipidemia   . PONV (postoperative nausea and vomiting)   . Sickle-cell trait (HCC)   . Sinus bradycardia    a. HR 40s even on low dose metoprolol - BB stopped with resolution.  . Streptococcal sore throat   . Type II or unspecified type diabetes mellitus without mention of complication, not stated as uncontrolled     Past Surgical History:  Procedure Laterality Date  . ANGIOPLASTY  09/21/00  . CARDIAC CATHETERIZATION    . JOINT REPLACEMENT    . TOTAL KNEE ARTHROPLASTY Right 02/23/2020   Procedure: RIGHT TOTAL KNEE ARTHROPLASTY;  Surgeon: Tarry Kos, MD;  Location: [facility];  Service: Orthopedics;  Laterality: Right;  . TOTAL KNEE  ARTHROPLASTY Left 12/27/2020   Procedure: LEFT TOTAL KNEE ARTHROPLASTY;  Surgeon: Tarry Kos, MD;  Location: [facility] OR;  Service: Orthopedics;  Laterality: Left;    There were no vitals filed for this visit.   Subjective Assessment - 02/18/21 1100    Subjective "I am feeling alittle stiff today but I think it is the weather."    Currently in Pain? Yes    Pain Score 2               [facility] PT Assessment - 02/18/21 0001      Assessment   Medical Diagnosis Left TKA    Referring Provider (PT) Leveda Anna PA-C                         [facility] Adult PT Treatment/Exercise - 02/18/21 0001      Knee/Hip Exercises: Aerobic   Recumbent Bike L1 x 6 min   lowering seated at 3 min     Knee/Hip Exercises: Standing   Heel Raises 2 sets;20 reps   with bil HHA from table   Forward Step Up 2 sets;Step Height: 6";Both    Wall Squat 2 sets;10 reps    Gait Training 4 x 20 ft cues for equal stride and heel strike/ toe off      Knee/Hip Exercises: Seated  Long [employer] 3 sets;10 reps   focus on eccentric loading   Long Arc Quad Weight 3 lbs.    Marching 3 sets;10 reps    Marching Weights 3 lbs.      Manual Therapy   Manual therapy comments IASTM along the quad tendon on the L    Joint Mobilization tibiofemoral PA/ AP grade III with active knee flexion between sets                  PT Education - 02/18/21 1132    Education Details Reviewed HEP and updated today for wall squat. how to perform IASTM techniques to herself using a spoon along the quad tenson.    Person(s) Educated Patient    Methods Explanation;Verbal cues;Handout    Comprehension Verbalized understanding;Verbal cues required            PT Short Term Goals - 02/11/21 1248      PT SHORT TERM GOAL #1   Title pt to be I with intial HEP    Time 4    Period Weeks    Status On-going    Target Date 02/22/21      PT SHORT TERM GOAL #2   Title Patient will demonstrate full extension    Baseline knee  extenion at -4 prior to treatment and -3 following treatment    Time 4    Period Weeks    Status On-going    Target Date 04/09/20      PT SHORT TERM GOAL #3   Title Patient will improve knee flexion to 110 degrees with mild pain    Baseline knee flexion 102 degrees    Time 4    Period Weeks    Status On-going      PT SHORT TERM GOAL #4   Title Therapy will review FOTO with paitient    Baseline reviewed today 51% function    Time 1    Period Weeks    Status Achieved    Target Date 02/01/21             PT Long Term Goals - 01/25/21 1457      PT LONG TERM GOAL #1   Title Patient will walk 1 mile without reports of pain    Time 8    Period Weeks    Status New    Target Date 03/22/21      PT LONG TERM GOAL #2   Title Patient will go up/down 9 steps without pain    Time 8    Period Weeks    Status New    Target Date 03/22/21      PT LONG TERM GOAL #3   Title Patient will bend down to pick object up off the floor safely and without increased pain    Time 8    Period Weeks    Status New    Target Date 03/22/21                 Plan - 02/18/21 1124    Clinical Impression Statement pt arrives noting increased soreness located at the quad tendon today which she noted is related to the weather. continued working manual techniques focusing on IASTM along the quad tendone teaching how it can be done at home. focused session primarily on strengthening in a CKC position which she did well with and practiced heel strike and toe off and equal stride. end of session she noted feeling much better.  PT Treatment/Interventions ADLs/Self Care Home Management;Electrical Stimulation;Cryotherapy;Iontophoresis 4mg /ml Dexamethasone;Moist Heat;Gait training;Stair training;Functional mobility training;Therapeutic activities;Therapeutic exercise;Balance training;Neuromuscular re-education;Patient/family education;Manual techniques;Taping;Passive range of motion    PT Next Visit Plan  manual therapy to improve flexion and extension; cane training; seated exeercises with a progression to standing exercises. vaso for pain/ swelling PRN    PT Home Exercise Plan [id number] quad sets, knee extension stretch; knee flexion stretch    Consulted and Agree with Plan of Care Patient           Patient will benefit from skilled therapeutic intervention in order to improve the following deficits and impairments:     Visit Diagnosis: Acute pain of left knee  Stiffness of left knee, not elsewhere classified  Other abnormalities of gait and mobility  Localized edema     Problem List Patient Active Problem List   Diagnosis Date Noted  . Status post total left knee replacement 12/27/2020  . Primary osteoarthritis of left knee 12/26/2020  . Fatigue 06/18/2020  . Status post total right knee replacement 02/23/2020  . Acute seasonal allergic rhinitis 12/19/2019  . Pre-op evaluation 10/23/2019  . Murmur 12/10/2015  . Pes planus of both feet 01/07/2015  . Depression 01/07/2015  . Female cystocele 06/26/2014  . Healthcare maintenance 12/17/2012  . Osteoarthritis 06/21/2010  . Hyperlipidemia 05/31/2009  . DIVERTICULOSIS, COLON 10/16/2008  . AORTIC STENOSIS, MILD 06/02/2008  . Diabetes mellitus type 2, controlled, without complications (HCC) 12/06/2006  . Obesity (BMI 30-39.9) 12/06/2006  . SICKLE CELL TRAIT 12/06/2006  . HYPERTENSION, BENIGN SYSTEMIC 12/06/2006  . CORONARY, ARTERIOSCLEROSIS 12/06/2006    12/08/2006 PT, DPT, LAT, ATC  02/18/21  11:43 AM      [facility] Outpatient Rehabilitation [facility] [address] [place], [place], [place] Phone: [phone]   Fax:  [phone]  Name: Terri Estrada MRN: [name] Date of Birth: [birth year]

## 2021-02-23 ENCOUNTER — Encounter: Payer: Self-pay | Admitting: Physical Therapy

## 2021-02-23 ENCOUNTER — Other Ambulatory Visit: Payer: Self-pay

## 2021-02-23 ENCOUNTER — Ambulatory Visit: Payer: Medicare Other | Admitting: Physical Therapy

## 2021-02-23 DIAGNOSIS — R269 Unspecified abnormalities of gait and mobility: Secondary | ICD-10-CM

## 2021-02-23 DIAGNOSIS — R2689 Other abnormalities of gait and mobility: Secondary | ICD-10-CM

## 2021-02-23 DIAGNOSIS — M25562 Pain in left knee: Secondary | ICD-10-CM

## 2021-02-23 DIAGNOSIS — M25662 Stiffness of left knee, not elsewhere classified: Secondary | ICD-10-CM

## 2021-02-23 DIAGNOSIS — R600 Localized edema: Secondary | ICD-10-CM

## 2021-02-23 NOTE — Therapy (Signed)
[place], [place], [address] Phone: [phone]   Fax:  [phone]  Physical Therapy Treatment  Patient Details  Name: Terri Estrada MRN: [id number] Date of Birth: 19-Jan-1951 Referring Provider (PT): Jacqualine Mau PA-C   Encounter Date: 02/23/2021   PT End of Session - 02/23/21 1107    Visit Number 6    Number of Visits 16    Date for PT Re-Evaluation 03/22/21    Authorization Type UHC MCR/MCD    PT Start Time 1101    PT Stop Time 1150    PT Time Calculation (min) 49 min    Activity Tolerance Patient tolerated treatment well    Behavior During Therapy [facility] for tasks assessed/performed           Past Medical History:  Diagnosis Date  . Arthritis   . CAD in native artery    a. MI 2001 s/p PTCA/stenting to mid-distal junction of RCA - residual dx treated medically.  . Depression    pt reports having it intermittently  . Diverticulosis of colon (without mention of hemorrhage)   . Essential hypertension, benign   . MI (myocardial infarction) ([place])   . Mild pulmonary hypertension ([place])   . Obesity, unspecified   . Onychia and paronychia of toe   . Osteoarthrosis, unspecified whether generalized or localized, unspecified site   . Other and unspecified hyperlipidemia   . PONV (postoperative nausea and vomiting)   . Sickle-cell trait ([place])   . Sinus bradycardia    a. HR 40s even on low dose metoprolol - BB stopped with resolution.  . Streptococcal sore throat   . Type II or unspecified type diabetes mellitus without mention of complication, not stated as uncontrolled     Past Surgical History:  Procedure Laterality Date  . ANGIOPLASTY  09/21/00  . CARDIAC CATHETERIZATION    . JOINT REPLACEMENT    . TOTAL KNEE ARTHROPLASTY Right 02/23/2020   Procedure: RIGHT TOTAL KNEE ARTHROPLASTY;  Surgeon: Leandrew Koyanagi, MD;  Location: [place];  Service: Orthopedics;  Laterality: Right;  . TOTAL KNEE  ARTHROPLASTY Left 12/27/2020   Procedure: LEFT TOTAL KNEE ARTHROPLASTY;  Surgeon: Leandrew Koyanagi, MD;  Location: [place];  Service: Orthopedics;  Laterality: Left;    There were no vitals filed for this visit.   Subjective Assessment - 02/23/21 1108    Subjective " I really should have taken the ice after I left last session, it got pretty sore. today I am doing a little better though."    Currently in Pain? Yes    Pain Orientation Left    Pain Descriptors / Indicators Aching    Pain Type Surgical pain    Aggravating Factors  doing too much    Pain Relieving Factors ice,exercise              [facility] PT Assessment - 02/23/21 0001      Assessment   Medical Diagnosis Left TKA    Referring Provider (PT) Jacqualine Mau PA-C      AROM   Left Knee Extension 5    Left Knee Flexion 108                         [facility] Adult PT Treatment/Exercise - 02/23/21 0001      Knee/Hip Exercises: Stretches   Active Hamstring Stretch 2 reps;30 seconds    Quad Stretch 2 reps;30 seconds  Quad Stretch Limitations thomas test position      Knee/Hip Exercises: Aerobic   Recumbent Bike L1 x 6 min   lowering seat ever 2 min     Knee/Hip Exercises: Machines for Strengthening   Cybex Knee Extension 2 x 12 reps 10#    Cybex Knee Flexion 2 x 12 15#    Cybex Leg Press 3 x 10 40#      Vasopneumatic   Number Minutes Vasopneumatic  10 minutes    Vasopnuematic Location  Knee    Vasopneumatic Pressure Medium    Vasopneumatic Temperature  34      Manual Therapy   Manual Therapy Joint mobilization;Passive ROM    Manual therapy comments MTPR along the L VMO    Joint Mobilization tibiofemoral PA/ AP grade III with active knee flexion between sets    Passive ROM for the L knee flexion combined with RLE reciprocal inhibition                    PT Short Term Goals - 02/23/21 1140      PT SHORT TERM GOAL #1   Title pt to be I with intial HEP    Period Weeks    Status Achieved       PT SHORT TERM GOAL #2   Title Patient will demonstrate full extension    Period Weeks    Status Partially Met      PT SHORT TERM GOAL #3   Title Patient will improve knee flexion to 110 degrees with mild pain    Period Weeks    Status Partially Met      PT SHORT TERM GOAL #4   Title Therapy will review FOTO with paitient    Period Weeks    Status Achieved             PT Long Term Goals - 01/25/21 1457      PT LONG TERM GOAL #1   Title Patient will walk 1 mile without reports of pain    Time 8    Period Weeks    Status New    Target Date 03/22/21      PT LONG TERM GOAL #2   Title Patient will go up/down 9 steps without pain    Time 8    Period Weeks    Status New    Target Date 03/22/21      PT LONG TERM GOAL #3   Title Patient will bend down to pick object up off the floor safely and without increased pain    Time 8    Period Weeks    Status New    Target Date 03/22/21                 Plan - 02/23/21 1137    Clinical Impression Statement Mrs Ehrlich is making good progress with physical therapy increasing knee total arc ROM 5 - 108 degrees today. continued working on knee PROM/ mobs to promote knee flexion. she did very well with strengtheing however did noting increased sorness end of session. utilzied vaso end of session to calm down soreness.    PT Treatment/Interventions ADLs/Self Care Home Management;Electrical Stimulation;Cryotherapy;Iontophoresis 73m/ml Dexamethasone;Moist Heat;Gait training;Stair training;Functional mobility training;Therapeutic activities;Therapeutic exercise;Balance training;Neuromuscular re-education;Patient/family education;Manual techniques;Taping;Passive range of motion    PT Next Visit Plan manual therapy to improve flexion and extension; cane training; seated exeercises with a progression to standing exercises. vaso for pain/ swelling PRN, gait training.  PT Home Exercise Plan [phone] quad sets, knee extension stretch;  knee flexion stretch           Patient will benefit from skilled therapeutic intervention in order to improve the following deficits and impairments:  Abnormal gait,Decreased range of motion,Difficulty walking,Pain,Decreased strength,Decreased skin integrity,Decreased activity tolerance,Increased muscle spasms,Decreased endurance,Decreased safety awareness,Increased edema,Decreased mobility  Visit Diagnosis: Acute pain of left knee  Stiffness of left knee, not elsewhere classified  Other abnormalities of gait and mobility  Localized edema  Abnormal gait     Problem List Patient Active Problem List   Diagnosis Date Noted  . Status post total left knee replacement 12/27/2020  . Primary osteoarthritis of left knee 12/26/2020  . Fatigue 06/18/2020  . Status post total right knee replacement 02/23/2020  . Acute seasonal allergic rhinitis 12/19/2019  . Pre-op evaluation 10/23/2019  . Murmur 12/10/2015  . Pes planus of both feet 01/07/2015  . Depression 01/07/2015  . Female cystocele 06/26/2014  . Healthcare maintenance 12/17/2012  . Osteoarthritis 06/21/2010  . Hyperlipidemia 05/31/2009  . DIVERTICULOSIS, COLON 10/16/2008  . AORTIC STENOSIS, MILD 06/02/2008  . Diabetes mellitus type 2, controlled, without complications ([place]) 81/38/8719  . Obesity (BMI 30-39.9) 12/06/2006  . SICKLE CELL TRAIT 12/06/2006  . HYPERTENSION, BENIGN SYSTEMIC 12/06/2006  . CORONARY, ARTERIOSCLEROSIS 12/06/2006   Starr Lake PT, DPT, LAT, ATC  02/23/21  11:42 AM      [facility] [address] [place], [place], [address] Phone: [phone]   Fax:  [phone]  Name: Natacia Chaisson MRN: [id number] Date of Birth: [birth year]

## 2021-02-25 ENCOUNTER — Other Ambulatory Visit: Payer: Self-pay

## 2021-02-25 ENCOUNTER — Ambulatory Visit: Payer: Medicare Other | Admitting: Physical Therapy

## 2021-02-25 ENCOUNTER — Encounter: Payer: Self-pay | Admitting: Physical Therapy

## 2021-02-25 DIAGNOSIS — M25562 Pain in left knee: Secondary | ICD-10-CM

## 2021-02-25 DIAGNOSIS — R600 Localized edema: Secondary | ICD-10-CM

## 2021-02-25 DIAGNOSIS — R269 Unspecified abnormalities of gait and mobility: Secondary | ICD-10-CM

## 2021-02-25 DIAGNOSIS — R2689 Other abnormalities of gait and mobility: Secondary | ICD-10-CM

## 2021-02-25 DIAGNOSIS — M25662 Stiffness of left knee, not elsewhere classified: Secondary | ICD-10-CM

## 2021-02-25 NOTE — Therapy (Signed)
[place], [place], [address] Phone: [phone]   Fax:  [phone]  Physical Therapy Treatment  Patient Details  Name: Terri Estrada MRN: [id number] Date of Birth: May 05, 1951 Referring Provider (PT): Jacqualine Mau PA-C   Encounter Date: 02/25/2021   PT End of Session - 02/25/21 1018    Visit Number 7    Number of Visits 16    Date for PT Re-Evaluation 03/22/21    Authorization Type UHC MCR/MCD    PT Start Time 1016    PT Stop Time 1104    PT Time Calculation (min) 48 min    Activity Tolerance Patient tolerated treatment well    Behavior During Therapy [facility] for tasks assessed/performed           Past Medical History:  Diagnosis Date  . Arthritis   . CAD in native artery    a. MI 2001 s/p PTCA/stenting to mid-distal junction of RCA - residual dx treated medically.  . Depression    pt reports having it intermittently  . Diverticulosis of colon (without mention of hemorrhage)   . Essential hypertension, benign   . MI (myocardial infarction) ([place])   . Mild pulmonary hypertension ([place])   . Obesity, unspecified   . Onychia and paronychia of toe   . Osteoarthrosis, unspecified whether generalized or localized, unspecified site   . Other and unspecified hyperlipidemia   . PONV (postoperative nausea and vomiting)   . Sickle-cell trait ([place])   . Sinus bradycardia    a. HR 40s even on low dose metoprolol - BB stopped with resolution.  . Streptococcal sore throat   . Type II or unspecified type diabetes mellitus without mention of complication, not stated as uncontrolled     Past Surgical History:  Procedure Laterality Date  . ANGIOPLASTY  09/21/00  . CARDIAC CATHETERIZATION    . JOINT REPLACEMENT    . TOTAL KNEE ARTHROPLASTY Right 02/23/2020   Procedure: RIGHT TOTAL KNEE ARTHROPLASTY;  Surgeon: Leandrew Koyanagi, MD;  Location: [facility];  Service: Orthopedics;  Laterality: Right;  . TOTAL KNEE  ARTHROPLASTY Left 12/27/2020   Procedure: LEFT TOTAL KNEE ARTHROPLASTY;  Surgeon: Leandrew Koyanagi, MD;  Location: [facility];  Service: Orthopedics;  Laterality: Left;    There were no vitals filed for this visit.   Subjective Assessment - 02/25/21 1019    Subjective " I was pretty sore from exercise the other day but today I am doing pretty good."    Patient Stated Goals Get back to walking    Currently in Pain? No/denies    Pain Location Knee    Pain Orientation Left                             [facility] Adult PT Treatment/Exercise - 02/25/21 0001      Knee/Hip Exercises: Stretches   Active Hamstring Stretch 2 reps;30 seconds    Quad Stretch 2 reps;30 seconds      Knee/Hip Exercises: Aerobic   Recumbent Bike L2 x 6 min   lowering seat every 2 min     Knee/Hip Exercises: Machines for Strengthening   Cybex Knee Extension 2 x 12 reps 15# bil LE    Cybex Knee Flexion 2 x 12 20#    Cybex Leg Press 3 x 12 40#      Knee/Hip Exercises: Standing   Forward Step Up 3 sets;10 reps;Step Height: 6"  Forward Step Up Limitations Cues to    Gait Training 6 x 50 ft withou SPC cues to focus on equal stride and heel strike/ toe off.   cues to avoid taking large steps and demosntration for reciprocal arm swing     Cryotherapy   Number Minutes Cryotherapy 10 Minutes    Cryotherapy Location Knee    Type of Cryotherapy Ice pack   in supine                   PT Short Term Goals - 02/23/21 1140      PT SHORT TERM GOAL #1   Title pt to be I with intial HEP    Period Weeks    Status Achieved      PT SHORT TERM GOAL #2   Title Patient will demonstrate full extension    Period Weeks    Status Partially Met      PT SHORT TERM GOAL #3   Title Patient will improve knee flexion to 110 degrees with mild pain    Period Weeks    Status Partially Met      PT SHORT TERM GOAL #4   Title Therapy will review FOTO with paitient    Period Weeks    Status Achieved              PT Long Term Goals - 01/25/21 1457      PT LONG TERM GOAL #1   Title Patient will walk 1 mile without reports of pain    Time 8    Period Weeks    Status New    Target Date 03/22/21      PT LONG TERM GOAL #2   Title Patient will go up/down 9 steps without pain    Time 8    Period Weeks    Status New    Target Date 03/22/21      PT LONG TERM GOAL #3   Title Patient will bend down to pick object up off the floor safely and without increased pain    Time 8    Period Weeks    Status New    Target Date 03/22/21                 Plan - 02/25/21 1051    Clinical Impression Statement pt reports no pain today. focused session primarily on functional hip/ knee strengthening increasing reps to promote functional endurance. worked on Personnel officer which she did require verbal cues and demonstration for reciprocal arm swing and heel strike/ toe off with equal stride. continued ice today end of session for soreness.    PT Treatment/Interventions ADLs/Self Care Home Management;Electrical Stimulation;Cryotherapy;Iontophoresis 41m/ml Dexamethasone;Moist Heat;Gait training;Stair training;Functional mobility training;Therapeutic activities;Therapeutic exercise;Balance training;Neuromuscular re-education;Patient/family education;Manual techniques;Taping;Passive range of motion    PT Next Visit Plan FOTO, mobs/ STW PRN,continue gait/ stair training; gross hip/ knee strengthening PRE, modalities PRN for soreness.    PT Home Exercise Plan [phone] sets, knee extension stretch; knee flexion stretch           Patient will benefit from skilled therapeutic intervention in order to improve the following deficits and impairments:  Abnormal gait,Decreased range of motion,Difficulty walking,Pain,Decreased strength,Decreased skin integrity,Decreased activity tolerance,Increased muscle spasms,Decreased endurance,Decreased safety awareness,Increased edema,Decreased mobility  Visit  Diagnosis: Acute pain of left knee  Stiffness of left knee, not elsewhere classified  Other abnormalities of gait and mobility  Localized edema  Abnormal gait     Problem List Patient Active  Problem List   Diagnosis Date Noted  . Status post total left knee replacement 12/27/2020  . Primary osteoarthritis of left knee 12/26/2020  . Fatigue 06/18/2020  . Status post total right knee replacement 02/23/2020  . Acute seasonal allergic rhinitis 12/19/2019  . Pre-op evaluation 10/23/2019  . Murmur 12/10/2015  . Pes planus of both feet 01/07/2015  . Depression 01/07/2015  . Female cystocele 06/26/2014  . Healthcare maintenance 12/17/2012  . Osteoarthritis 06/21/2010  . Hyperlipidemia 05/31/2009  . DIVERTICULOSIS, COLON 10/16/2008  . AORTIC STENOSIS, MILD 06/02/2008  . Diabetes mellitus type 2, controlled, without complications ([place]) 62/44/6950  . Obesity (BMI 30-39.9) 12/06/2006  . SICKLE CELL TRAIT 12/06/2006  . HYPERTENSION, BENIGN SYSTEMIC 12/06/2006  . CORONARY, ARTERIOSCLEROSIS 12/06/2006   Starr Lake PT, DPT, LAT, ATC  02/25/21  10:56 AM      [facility] [address] [place], [place], [address] Phone: [phone]   Fax:  [phone]  Name: Terri Estrada MRN: [id number] Date of Birth: 19-Mar-1951

## 2021-03-02 ENCOUNTER — Encounter: Payer: Self-pay | Admitting: Physical Therapy

## 2021-03-02 ENCOUNTER — Ambulatory Visit: Payer: Medicare Other | Admitting: Physical Therapy

## 2021-03-02 ENCOUNTER — Other Ambulatory Visit: Payer: Self-pay

## 2021-03-02 DIAGNOSIS — R269 Unspecified abnormalities of gait and mobility: Secondary | ICD-10-CM

## 2021-03-02 DIAGNOSIS — M25562 Pain in left knee: Secondary | ICD-10-CM

## 2021-03-02 DIAGNOSIS — R600 Localized edema: Secondary | ICD-10-CM

## 2021-03-02 DIAGNOSIS — M25662 Stiffness of left knee, not elsewhere classified: Secondary | ICD-10-CM

## 2021-03-02 DIAGNOSIS — R2689 Other abnormalities of gait and mobility: Secondary | ICD-10-CM

## 2021-03-02 NOTE — Therapy (Signed)
[place], [place], [address] Phone: [phone]   Fax:  [phone]  Physical Therapy Treatment  Patient Details  Name: Delanna Blacketer MRN: [id number] Date of Birth: 1951-09-10 Referring Provider (PT): Jacqualine Mau PA-C   Encounter Date: 03/02/2021   PT End of Session - 03/02/21 1022    Visit Number 8    Number of Visits 16    Date for PT Re-Evaluation 03/22/21    Authorization Type UHC MCR/MCD    PT Start Time 1017    PT Stop Time 1110    PT Time Calculation (min) 53 min           Past Medical History:  Diagnosis Date  . Arthritis   . CAD in native artery    a. MI 2001 s/p PTCA/stenting to mid-distal junction of RCA - residual dx treated medically.  . Depression    pt reports having it intermittently  . Diverticulosis of colon (without mention of hemorrhage)   . Essential hypertension, benign   . MI (myocardial infarction) ([place])   . Mild pulmonary hypertension ([place])   . Obesity, unspecified   . Onychia and paronychia of toe   . Osteoarthrosis, unspecified whether generalized or localized, unspecified site   . Other and unspecified hyperlipidemia   . PONV (postoperative nausea and vomiting)   . Sickle-cell trait ([place])   . Sinus bradycardia    a. HR 40s even on low dose metoprolol - BB stopped with resolution.  . Streptococcal sore throat   . Type II or unspecified type diabetes mellitus without mention of complication, not stated as uncontrolled     Past Surgical History:  Procedure Laterality Date  . ANGIOPLASTY  09/21/00  . CARDIAC CATHETERIZATION    . JOINT REPLACEMENT    . TOTAL KNEE ARTHROPLASTY Right 02/23/2020   Procedure: RIGHT TOTAL KNEE ARTHROPLASTY;  Surgeon: Leandrew Koyanagi, MD;  Location: [place];  Service: Orthopedics;  Laterality: Right;  . TOTAL KNEE ARTHROPLASTY Left 12/27/2020   Procedure: LEFT TOTAL KNEE ARTHROPLASTY;  Surgeon: Leandrew Koyanagi, MD;  Location: [place];   Service: Orthopedics;  Laterality: Left;    There were no vitals filed for this visit.   Subjective Assessment - 03/02/21 1019    Subjective No pain today.    Currently in Pain? No/denies              [facility] PT Assessment - 03/02/21 0001      Observation/Other Assessments   Focus on Therapeutic Outcomes (FOTO)  69%, predicted to improve to 71%      AROM   Left Knee Flexion 105   107 after stretch     Standardized Balance Assessment   Standardized Balance Assessment Five Times Sit to Stand    Five times sit to stand comments  15.8                         [facility] Adult PT Treatment/Exercise - 03/02/21 0001      Knee/Hip Exercises: Stretches   Sports administrator 2 reps;30 seconds    Quad Stretch Limitations thomas test position      Knee/Hip Exercises: Machines for Strengthening   Cybex Knee Extension 2 x 12 reps 15# bil LE    Cybex Knee Flexion 1 x 15# 25#    Cybex Leg Press 3 x 12 40#      Knee/Hip Exercises: Standing   Stairs up and down  clinic stairs alternating pattern, bilat UE on descent, 12 6 inch stairs- min pain with descent                    PT Short Term Goals - 02/23/21 1140      PT SHORT TERM GOAL #1   Title pt to be I with intial HEP    Period Weeks    Status Achieved      PT SHORT TERM GOAL #2   Title Patient will demonstrate full extension    Period Weeks    Status Partially Met      PT SHORT TERM GOAL #3   Title Patient will improve knee flexion to 110 degrees with mild pain    Period Weeks    Status Partially Met      PT SHORT TERM GOAL #4   Title Therapy will review FOTO with paitient    Period Weeks    Status Achieved             PT Long Term Goals - 03/02/21 1022      PT LONG TERM GOAL #1   Title Patient will walk 1 mile without reports of pain    Baseline is walking 3 mail boxes and was instructed how to use her smart watch to check time and distance. She would like to resume walking at track with sister.     Time 8    Period Weeks    Status On-going      PT LONG TERM GOAL #2   Title Patient will go up/down 9 steps without pain    Baseline using 9 stairs to enter home , step to pattern right now. min pain with descent in clinic    Time 8    Period Weeks    Status Partially Met      PT LONG TERM GOAL #3   Title Patient will bend down to pick object up off the floor safely and without increased pain    Baseline able to pick up timer in clinic from floor without pain of difficulty    Time 8    Period Weeks    Status Achieved                 Plan - 03/02/21 1053    Clinical Impression Statement Pt FOTO score improved to 69% with 71% her prediction. She is currently walking 20 minutes in neighborhood and is eager to return to walking track with her sister. She is able to climb stairs with surgical leg in clinic without pain. She was encouraged to do this at home and she has been using the RLE only. She did have pain min with descent on stairs using UE. Continued with LE strengthening on gym machines and she tolerated session well. Ice applied to knee and end of session to decrease soreness. LTG# 3 met.    PT Next Visit Plan FOTO, mobs/ STW PRN,continue gait/ stair training; gross hip/ knee strengthening PRE, modalities PRN for soreness.    PT Home Exercise Plan [phone] quad sets, knee extension stretch; knee flexion stretch    Consulted and Agree with Plan of Care Patient           Patient will benefit from skilled therapeutic intervention in order to improve the following deficits and impairments:  Abnormal gait,Decreased range of motion,Difficulty walking,Pain,Decreased strength,Decreased skin integrity,Decreased activity tolerance,Increased muscle spasms,Decreased endurance,Decreased safety awareness,Increased edema,Decreased mobility  Visit Diagnosis: Acute pain of left knee  Stiffness of left knee, not elsewhere classified  Other abnormalities of gait and  mobility  Localized edema  Abnormal gait     Problem List Patient Active Problem List   Diagnosis Date Noted  . Status post total left knee replacement 12/27/2020  . Primary osteoarthritis of left knee 12/26/2020  . Fatigue 06/18/2020  . Status post total right knee replacement 02/23/2020  . Acute seasonal allergic rhinitis 12/19/2019  . Pre-op evaluation 10/23/2019  . Murmur 12/10/2015  . Pes planus of both feet 01/07/2015  . Depression 01/07/2015  . Female cystocele 06/26/2014  . Healthcare maintenance 12/17/2012  . Osteoarthritis 06/21/2010  . Hyperlipidemia 05/31/2009  . DIVERTICULOSIS, COLON 10/16/2008  . AORTIC STENOSIS, MILD 06/02/2008  . Diabetes mellitus type 2, controlled, without complications ([place]) 09/32/3557  . Obesity (BMI 30-39.9) 12/06/2006  . SICKLE CELL TRAIT 12/06/2006  . HYPERTENSION, BENIGN SYSTEMIC 12/06/2006  . CORONARY, ARTERIOSCLEROSIS 12/06/2006    Dorene Ar, PTA 03/02/2021, 12:13 PM  [place], [place], [address] Phone: [phone]   Fax:  [phone]  Name: Nava Song MRN: [id number] Date of Birth: [birth year]

## 2021-03-04 ENCOUNTER — Ambulatory Visit: Payer: Medicare Other | Admitting: Physical Therapy

## 2021-03-04 ENCOUNTER — Encounter: Payer: Self-pay | Admitting: Physical Therapy

## 2021-03-04 ENCOUNTER — Other Ambulatory Visit: Payer: Self-pay

## 2021-03-04 DIAGNOSIS — M25662 Stiffness of left knee, not elsewhere classified: Secondary | ICD-10-CM

## 2021-03-04 DIAGNOSIS — M25562 Pain in left knee: Secondary | ICD-10-CM

## 2021-03-04 DIAGNOSIS — R2689 Other abnormalities of gait and mobility: Secondary | ICD-10-CM

## 2021-03-04 NOTE — Therapy (Signed)
[place], [place], [address] Phone: [phone]   Fax:  [phone]  Physical Therapy Treatment  Patient Details  Name: Terri Estrada MRN: [id number] Date of Birth: [birth year] Referring Provider (PT): Jacqualine Mau PA-C   Encounter Date: 03/04/2021   PT End of Session - 03/04/21 1036    Visit Number 9    Number of Visits 16    Date for PT Re-Evaluation 03/22/21    Authorization Type UHC MCR/MCD    PT Start Time 1015    PT Stop Time 1105    PT Time Calculation (min) 50 min           Past Medical History:  Diagnosis Date  . Arthritis   . CAD in native artery    a. MI 2001 s/p PTCA/stenting to mid-distal junction of RCA - residual dx treated medically.  . Depression    pt reports having it intermittently  . Diverticulosis of colon (without mention of hemorrhage)   . Essential hypertension, benign   . MI (myocardial infarction) ([place])   . Mild pulmonary hypertension ([place])   . Obesity, unspecified   . Onychia and paronychia of toe   . Osteoarthrosis, unspecified whether generalized or localized, unspecified site   . Other and unspecified hyperlipidemia   . PONV (postoperative nausea and vomiting)   . Sickle-cell trait ([place])   . Sinus bradycardia    a. HR 40s even on low dose metoprolol - BB stopped with resolution.  . Streptococcal sore throat   . Type II or unspecified type diabetes mellitus without mention of complication, not stated as uncontrolled     Past Surgical History:  Procedure Laterality Date  . ANGIOPLASTY  09/21/00  . CARDIAC CATHETERIZATION    . JOINT REPLACEMENT    . TOTAL KNEE ARTHROPLASTY Right 02/23/2020   Procedure: RIGHT TOTAL KNEE ARTHROPLASTY;  Surgeon: Leandrew Koyanagi, MD;  Location: [facility];  Service: Orthopedics;  Laterality: Right;  . TOTAL KNEE ARTHROPLASTY Left 12/27/2020   Procedure: LEFT TOTAL KNEE ARTHROPLASTY;  Surgeon: Leandrew Koyanagi, MD;  Location: [facility];   Service: Orthopedics;  Laterality: Left;    There were no vitals filed for this visit.   Subjective Assessment - 03/04/21 1021    Subjective I was sore this morning with the rain.    Currently in Pain? Yes    Pain Score 5     Pain Location Knee    Pain Orientation Left    Pain Descriptors / Indicators Sore    Pain Type Surgical pain    Aggravating Factors  rain/weather    Pain Relieving Factors ice, exercise              [facility] PT Assessment - 03/04/21 0001      AROM   Left Knee Extension 5    Left Knee Flexion 106   108 after prone quad stretch                        [facility] Adult PT Treatment/Exercise - 03/04/21 0001      Knee/Hip Exercises: Stretches   Other Knee/Hip Stretches prone quad stretch 3 x 20 sec left      Knee/Hip Exercises: Aerobic   Recumbent Bike L2 x 6 min   lowering seat every 2 min     Knee/Hip Exercises: Machines for Strengthening   Cybex Knee Extension 2  x 15 15#    Cybex  Knee Flexion 2 x 15 25#    Cybex Leg Press 3 x 12 40#      Knee/Hip Exercises: Standing   Forward Step Up 10 reps;2 sets;Step Height: 8"      Knee/Hip Exercises: Seated   Sit to Sand 2 sets;10 reps   2nd set with RLE advanced slightly for LLE activation     Knee/Hip Exercises: Supine   Straight Leg Raises 2 sets;10 reps   combined with sustained quad set     Cryotherapy   Number Minutes Cryotherapy 10 Minutes    Cryotherapy Location Knee    Type of Cryotherapy Ice pack                    PT Short Term Goals - 02/23/21 1140      PT SHORT TERM GOAL #1   Title pt to be I with intial HEP    Period Weeks    Status Achieved      PT SHORT TERM GOAL #2   Title Patient will demonstrate full extension    Period Weeks    Status Partially Met      PT SHORT TERM GOAL #3   Title Patient will improve knee flexion to 110 degrees with mild pain    Period Weeks    Status Partially Met      PT SHORT TERM GOAL #4   Title Therapy will review FOTO  with paitient    Period Weeks    Status Achieved             PT Long Term Goals - 03/02/21 1022      PT LONG TERM GOAL #1   Title Patient will walk 1 mile without reports of pain    Baseline is walking 3 mail boxes and was instructed how to use her smart watch to check time and distance. She would like to resume walking at track with sister.    Time 8    Period Weeks    Status On-going      PT LONG TERM GOAL #2   Title Patient will go up/down 9 steps without pain    Baseline using 9 stairs to enter home , step to pattern right now. min pain with descent in clinic    Time 8    Period Weeks    Status Partially Met      PT LONG TERM GOAL #3   Title Patient will bend down to pick object up off the floor safely and without increased pain    Baseline able to pick up timer in clinic from floor without pain of difficulty    Time 8    Period Weeks    Status Achieved                 Plan - 03/04/21 1046    Clinical Impression Statement Pt reports no pain last 2 days until rain this morning. Now she is sore and rates soreness at 5/10. Continued with gym machines and closed chain strengthening. Progressed ROM with prone quad stretch. ROM improved from 104 to 108 at end of session.    PT Next Visit Plan mobs/ STW PRN,continue gait/ stair training; gross hip/ knee strengthening PRE, modalities PRN for soreness.    PT Home Exercise Plan [phone] quad sets, knee extension stretch; knee flexion stretch           Patient will benefit from skilled therapeutic intervention in order to improve the following deficits  and impairments:  Abnormal gait,Decreased range of motion,Difficulty walking,Pain,Decreased strength,Decreased skin integrity,Decreased activity tolerance,Increased muscle spasms,Decreased endurance,Decreased safety awareness,Increased edema,Decreased mobility  Visit Diagnosis: Acute pain of left knee  Stiffness of left knee, not elsewhere classified  Other  abnormalities of gait and mobility     Problem List Patient Active Problem List   Diagnosis Date Noted  . Status post total left knee replacement 12/27/2020  . Primary osteoarthritis of left knee 12/26/2020  . Fatigue 06/18/2020  . Status post total right knee replacement 02/23/2020  . Acute seasonal allergic rhinitis 12/19/2019  . Pre-op evaluation 10/23/2019  . Murmur 12/10/2015  . Pes planus of both feet 01/07/2015  . Depression 01/07/2015  . Female cystocele 06/26/2014  . Healthcare maintenance 12/17/2012  . Osteoarthritis 06/21/2010  . Hyperlipidemia 05/31/2009  . DIVERTICULOSIS, COLON 10/16/2008  . AORTIC STENOSIS, MILD 06/02/2008  . Diabetes mellitus type 2, controlled, without complications ([place]) 08/07/1313  . Obesity (BMI 30-39.9) 12/06/2006  . SICKLE CELL TRAIT 12/06/2006  . HYPERTENSION, BENIGN SYSTEMIC 12/06/2006  . CORONARY, ARTERIOSCLEROSIS 12/06/2006    Dorene Ar, PTA 03/04/2021, 11:10 AM  [place], [place], [address] Phone: [phone]   Fax:  [phone]  Name: Diann Bangerter MRN: [id number] Date of Birth: 05-Jul-1951

## 2021-03-08 ENCOUNTER — Encounter: Payer: Self-pay | Admitting: Physical Therapy

## 2021-03-08 ENCOUNTER — Ambulatory Visit: Payer: Medicare Other | Admitting: Physical Therapy

## 2021-03-08 ENCOUNTER — Other Ambulatory Visit: Payer: Self-pay

## 2021-03-08 DIAGNOSIS — R2689 Other abnormalities of gait and mobility: Secondary | ICD-10-CM

## 2021-03-08 DIAGNOSIS — M25562 Pain in left knee: Secondary | ICD-10-CM

## 2021-03-08 DIAGNOSIS — M25662 Stiffness of left knee, not elsewhere classified: Secondary | ICD-10-CM

## 2021-03-08 DIAGNOSIS — R600 Localized edema: Secondary | ICD-10-CM

## 2021-03-08 DIAGNOSIS — R269 Unspecified abnormalities of gait and mobility: Secondary | ICD-10-CM

## 2021-03-08 NOTE — Therapy (Signed)
[place], [place], [address] Phone: [phone]   Fax:  [phone]  Physical Therapy Treatment Progress Note Reporting Period 01/25/2021 to 03/08/2021  See note below for Objective Data and Assessment of Progress/Goals.       Patient Details  Name: Terri Estrada MRN: [id number] Date of Birth: 05-30-1951 Referring Provider (PT): Jacqualine Mau PA-C   Encounter Date: 03/08/2021   PT End of Session - 03/08/21 0848    Visit Number 10    Number of Visits 16    Date for PT Re-Evaluation 03/22/21    Authorization Type UHC MCR/MCD    Progress Note Due on Visit 20    PT Start Time 0847    PT Stop Time 0936    PT Time Calculation (min) 49 min    Activity Tolerance Patient tolerated treatment well    Behavior During Therapy [facility] for tasks assessed/performed           Past Medical History:  Diagnosis Date  . Arthritis   . CAD in native artery    a. MI 2001 s/p PTCA/stenting to mid-distal junction of RCA - residual dx treated medically.  . Depression    pt reports having it intermittently  . Diverticulosis of colon (without mention of hemorrhage)   . Essential hypertension, benign   . MI (myocardial infarction) ([place])   . Mild pulmonary hypertension ([place])   . Obesity, unspecified   . Onychia and paronychia of toe   . Osteoarthrosis, unspecified whether generalized or localized, unspecified site   . Other and unspecified hyperlipidemia   . PONV (postoperative nausea and vomiting)   . Sickle-cell trait ([place])   . Sinus bradycardia    a. HR 40s even on low dose metoprolol - BB stopped with resolution.  . Streptococcal sore throat   . Type II or unspecified type diabetes mellitus without mention of complication, not stated as uncontrolled     Past Surgical History:  Procedure Laterality Date  . ANGIOPLASTY  09/21/00  . CARDIAC CATHETERIZATION    . JOINT REPLACEMENT    . TOTAL KNEE  ARTHROPLASTY Right 02/23/2020   Procedure: RIGHT TOTAL KNEE ARTHROPLASTY;  Surgeon: Leandrew Koyanagi, MD;  Location: [facility];  Service: Orthopedics;  Laterality: Right;  . TOTAL KNEE ARTHROPLASTY Left 12/27/2020   Procedure: LEFT TOTAL KNEE ARTHROPLASTY;  Surgeon: Leandrew Koyanagi, MD;  Location: [facility];  Service: Orthopedics;  Laterality: Left;    There were no vitals filed for this visit.   Subjective Assessment - 03/08/21 0849    Subjective " I am doing fine, no pain right now."    Currently in Pain? No/denies    Pain Score 6    at worst 6/10   Pain Location Knee    Pain Orientation Left    Aggravating Factors  weather    Pain Relieving Factors rest, ice,              [facility] PT Assessment - 03/08/21 0001      Assessment   Medical Diagnosis Left TKA    Referring Provider (PT) Jacqualine Mau PA-C      Observation/Other Assessments   Focus on Therapeutic Outcomes (FOTO)  75%      AROM   Left Knee Extension 3    Left Knee Flexion 108                         [facility]  Adult PT Treatment/Exercise - 03/08/21 0001      Knee/Hip Exercises: Aerobic   Recumbent Bike L2 x 6 min      Knee/Hip Exercises: Machines for Strengthening   Cybex Knee Extension 2  x 15 20#    Cybex Knee Flexion 2 x 15 25#    Cybex Leg Press 1 x 15, 40#, 1 x 15 50#, 1 x 15 60#      Knee/Hip Exercises: Standing   Functional Squat 2 sets   12with bil Hand hold from free motion machine.     Modalities   Modalities Moist Heat      Moist Heat Therapy   Number Minutes Moist Heat 10 Minutes    Moist Heat Location Knee   L in supine                   PT Short Term Goals - 03/08/21 0859      PT SHORT TERM GOAL #1   Title pt to be I with intial HEP    Period Weeks    Status Achieved      PT SHORT TERM GOAL #2   Title Patient will demonstrate full extension    Period Weeks    Status Partially Met      PT SHORT TERM GOAL #3   Title Patient will improve knee flexion to 110 degrees  with mild pain    Baseline 108 degrees today    Period Weeks    Status Partially Met      PT SHORT TERM GOAL #4   Title Therapy will review FOTO with paitient    Status Achieved             PT Long Term Goals - 03/08/21 0859      PT LONG TERM GOAL #1   Title Patient will walk 1 mile without reports of pain    Period Weeks    Status Achieved      PT LONG TERM GOAL #2   Title Patient will go up/down 9 steps without pain    Period Weeks    Status Achieved      PT LONG TERM GOAL #3   Title Patient will bend down to pick object up off the floor safely and without increased pain    Period Weeks    Status Achieved      PT LONG TERM GOAL #4   Title increase FOTO score to </= 28% limited to demo improvement in function    Period Weeks      PT LONG TERM GOAL #5   Title pt to be I with all HEP given to maintain and progress current level of function    Period Weeks    Status Achieved                 Plan - 03/08/21 0926    Clinical Impression Statement Laryssa contiues to make good progress with physical therapy increasing knee ROM and additionalloy reports no pain today. she is making good progress with functional mobiliyt and increaesd her FOTO score to 75% function. continued working on gross hip/ knee strengthening which she did very well with. plan to see pt 1 x week for 2 weeks and if doing well then plan to discharge to HEP.    PT Treatment/Interventions ADLs/Self Care Home Management;Electrical Stimulation;Cryotherapy;Iontophoresis 4mg /ml Dexamethasone;Moist Heat;Gait training;Stair training;Functional mobility training;Therapeutic activities;Therapeutic exercise;Balance training;Neuromuscular re-education;Patient/family education;Manual techniques;Taping;Passive range of motion    PT Next Visit Plan mobs/  STW PRN,continue gait/ stair training; gross hip/ knee strengthening PRE, modalities PRN for soreness.    PT Home Exercise Plan [phone] quad sets, knee extension  stretch; knee flexion stretch    Consulted and Agree with Plan of Care Patient           Patient will benefit from skilled therapeutic intervention in order to improve the following deficits and impairments:  Abnormal gait,Decreased range of motion,Difficulty walking,Pain,Decreased strength,Decreased skin integrity,Decreased activity tolerance,Increased muscle spasms,Decreased endurance,Decreased safety awareness,Increased edema,Decreased mobility  Visit Diagnosis: Stiffness of left knee, not elsewhere classified  Acute pain of left knee  Other abnormalities of gait and mobility  Localized edema  Abnormal gait     Problem List Patient Active Problem List   Diagnosis Date Noted  . Status post total left knee replacement 12/27/2020  . Primary osteoarthritis of left knee 12/26/2020  . Fatigue 06/18/2020  . Status post total right knee replacement 02/23/2020  . Acute seasonal allergic rhinitis 12/19/2019  . Pre-op evaluation 10/23/2019  . Murmur 12/10/2015  . Pes planus of both feet 01/07/2015  . Depression 01/07/2015  . Female cystocele 06/26/2014  . Healthcare maintenance 12/17/2012  . Osteoarthritis 06/21/2010  . Hyperlipidemia 05/31/2009  . DIVERTICULOSIS, COLON 10/16/2008  . AORTIC STENOSIS, MILD 06/02/2008  . Diabetes mellitus type 2, controlled, without complications ([place]) 42/55/2589  . Obesity (BMI 30-39.9) 12/06/2006  . SICKLE CELL TRAIT 12/06/2006  . HYPERTENSION, BENIGN SYSTEMIC 12/06/2006  . CORONARY, ARTERIOSCLEROSIS 12/06/2006    Starr Lake PT, DPT, LAT, ATC  03/08/21  9:31 AM      [facility] [address] [place], [place], [address] Phone: [phone]   Fax:  [phone]  Name: Terri Estrada MRN: [id number] Date of Birth: May 16, 1951

## 2021-03-09 ENCOUNTER — Other Ambulatory Visit: Payer: Self-pay | Admitting: Family Medicine

## 2021-03-09 ENCOUNTER — Other Ambulatory Visit: Payer: Self-pay | Admitting: Physician Assistant

## 2021-03-09 ENCOUNTER — Telehealth: Payer: Self-pay | Admitting: Orthopaedic Surgery

## 2021-03-09 MED ORDER — TRAMADOL HCL 50 MG PO TABS: 50.0000 mg | ORAL_TABLET | Freq: Two times a day (BID) | ORAL | 0 refills | Status: DC | PRN

## 2021-03-09 NOTE — Telephone Encounter (Signed)
Patient called needing Rx refilled (Tramadol) The number to contact patient is [phone]

## 2021-03-09 NOTE — Telephone Encounter (Signed)
Sent in

## 2021-03-09 NOTE — Telephone Encounter (Signed)
Tried to call patient. No answer. Left message that her medication has been sent to her pharmacy.

## 2021-03-11 ENCOUNTER — Ambulatory Visit: Payer: Medicare Other | Admitting: Physical Therapy

## 2021-03-16 ENCOUNTER — Other Ambulatory Visit: Payer: Self-pay

## 2021-03-16 ENCOUNTER — Encounter: Payer: Self-pay | Admitting: Physical Therapy

## 2021-03-16 ENCOUNTER — Ambulatory Visit: Payer: Medicare Other | Attending: Physician Assistant | Admitting: Physical Therapy

## 2021-03-16 DIAGNOSIS — M25662 Stiffness of left knee, not elsewhere classified: Secondary | ICD-10-CM

## 2021-03-16 DIAGNOSIS — M25562 Pain in left knee: Secondary | ICD-10-CM

## 2021-03-16 DIAGNOSIS — R2689 Other abnormalities of gait and mobility: Secondary | ICD-10-CM

## 2021-03-16 NOTE — Therapy (Signed)
[place], [place], [address] Phone: [phone]   Fax:  [phone]  Physical Therapy Treatment / Re-certification  Patient Details  Name: Terri Estrada MRN: [id number] Date of Birth: December 03, 1950 Referring Provider (PT): Jacqualine Mau PA-C   Encounter Date: 03/16/2021   PT End of Session - 03/16/21 0935    Visit Number 11    Number of Visits 16    Date for PT Re-Evaluation 03/26/21    Authorization Type UHC MCR/MCD    Progress Note Due on Visit 20    PT Start Time 0932    Activity Tolerance Patient tolerated treatment well    Behavior During Therapy [facility] for tasks assessed/performed           Past Medical History:  Diagnosis Date  . Arthritis   . CAD in native artery    a. MI 2001 s/p PTCA/stenting to mid-distal junction of RCA - residual dx treated medically.  . Depression    pt reports having it intermittently  . Diverticulosis of colon (without mention of hemorrhage)   . Essential hypertension, benign   . MI (myocardial infarction) ([place])   . Mild pulmonary hypertension ([place])   . Obesity, unspecified   . Onychia and paronychia of toe   . Osteoarthrosis, unspecified whether generalized or localized, unspecified site   . Other and unspecified hyperlipidemia   . PONV (postoperative nausea and vomiting)   . Sickle-cell trait ([place])   . Sinus bradycardia    a. HR 40s even on low dose metoprolol - BB stopped with resolution.  . Streptococcal sore throat   . Type II or unspecified type diabetes mellitus without mention of complication, not stated as uncontrolled     Past Surgical History:  Procedure Laterality Date  . ANGIOPLASTY  09/21/00  . CARDIAC CATHETERIZATION    . JOINT REPLACEMENT    . TOTAL KNEE ARTHROPLASTY Right 02/23/2020   Procedure: RIGHT TOTAL KNEE ARTHROPLASTY;  Surgeon: Leandrew Koyanagi, MD;  Location: [facility];  Service: Orthopedics;  Laterality: Right;  . TOTAL KNEE  ARTHROPLASTY Left 12/27/2020   Procedure: LEFT TOTAL KNEE ARTHROPLASTY;  Surgeon: Leandrew Koyanagi, MD;  Location: [facility];  Service: Orthopedics;  Laterality: Left;    There were no vitals filed for this visit.   Subjective Assessment - 03/16/21 0936    Subjective " I've been walking more and I am not having any pain."    Currently in Pain? No/denies    Aggravating Factors  weather    Pain Relieving Factors rest, ice              [facility] PT Assessment - 03/16/21 0001      Assessment   Medical Diagnosis Left TKA    Referring Provider (PT) Jacqualine Mau PA-C      AROM   Left Knee Extension 1    Left Knee Flexion 110                         [facility] Adult PT Treatment/Exercise - 03/16/21 0001      Knee/Hip Exercises: Stretches   Quad Stretch 2 reps;30 seconds   with strap in thomas pos.     Knee/Hip Exercises: Aerobic   Recumbent Bike L4 x 6 min      Knee/Hip Exercises: Machines for Strengthening   Cybex Knee Extension 2 x 12 con bil/ ecc LLE only 15#    Cybex Knee Flexion  2 x 12 con bil/ ecc LLE only 20#    Cybex Leg Press 2 x 10 60#, 1 x 20 60#   con bil/ ecc LLE only first 2 sets, bil LE on 3rd set     Moist Heat Therapy   Number Minutes Moist Heat 10 Minutes    Moist Heat Location Knee   in supine              Balance Exercises - 03/16/21 0001      Balance Exercises: Standing   Standing Eyes Opened Narrow base of support (BOS);2 reps;30 secs    Standing Eyes Closed Narrow base of support (BOS);2 reps;30 secs    Tandem Stance Eyes open;4 reps;30 secs   alternating lead leg              PT Short Term Goals - 03/08/21 0859      PT SHORT TERM GOAL #1   Title pt to be I with intial HEP    Period Weeks    Status Achieved      PT SHORT TERM GOAL #2   Title Patient will demonstrate full extension    Period Weeks    Status Partially Met      PT SHORT TERM GOAL #3   Title Patient will improve knee flexion to 110 degrees with mild pain     Baseline 108 degrees today    Period Weeks    Status Partially Met      PT SHORT TERM GOAL #4   Title Therapy will review FOTO with paitient    Status Achieved             PT Long Term Goals - 03/08/21 0859      PT LONG TERM GOAL #1   Title Patient will walk 1 mile without reports of pain    Period Weeks    Status Achieved      PT LONG TERM GOAL #2   Title Patient will go up/down 9 steps without pain    Period Weeks    Status Achieved      PT LONG TERM GOAL #3   Title Patient will bend down to pick object up off the floor safely and without increased pain    Period Weeks    Status Achieved      PT LONG TERM GOAL #4   Title increase FOTO score to </= 28% limited to demo improvement in function    Period Weeks      PT LONG TERM GOAL #5   Title pt to be I with all HEP given to maintain and progress current level of function    Period Weeks    Status Achieved                 Plan - 03/16/21 1010    Clinical Impression Statement Mrs Nyeshia continues to make excellent progress with physical therpay increasing her total arc ROM 1 - 110 today reporting no pain. Continued working on hip/ knee strengtheing progressing strength working into single leg activation. She did well well with balance noting mild postural sway with tandem positioning requiring UE assist intermittently. plan to review HEP and perform final measurements if she continues to do well then anticipate d/c.    PT Treatment/Interventions ADLs/Self Care Home Management;Electrical Stimulation;Cryotherapy;Iontophoresis 73m/ml Dexamethasone;Moist Heat;Gait training;Stair training;Functional mobility training;Therapeutic activities;Therapeutic exercise;Balance training;Neuromuscular re-education;Patient/family education;Manual techniques;Taping;Passive range of motion    PT Next Visit Plan mobs/ STW PRN,ROM goals, FOTO?  PT Home Exercise Plan [phone] quad sets, knee extension stretch; knee flexion stretch     Consulted and Agree with Plan of Care Patient           Patient will benefit from skilled therapeutic intervention in order to improve the following deficits and impairments:  Abnormal gait,Decreased range of motion,Difficulty walking,Pain,Decreased strength,Decreased skin integrity,Decreased activity tolerance,Increased muscle spasms,Decreased endurance,Decreased safety awareness,Increased edema,Decreased mobility  Visit Diagnosis: Stiffness of left knee, not elsewhere classified  Acute pain of left knee  Other abnormalities of gait and mobility     Problem List Patient Active Problem List   Diagnosis Date Noted  . Status post total left knee replacement 12/27/2020  . Primary osteoarthritis of left knee 12/26/2020  . Fatigue 06/18/2020  . Status post total right knee replacement 02/23/2020  . Acute seasonal allergic rhinitis 12/19/2019  . Pre-op evaluation 10/23/2019  . Murmur 12/10/2015  . Pes planus of both feet 01/07/2015  . Depression 01/07/2015  . Female cystocele 06/26/2014  . Healthcare maintenance 12/17/2012  . Osteoarthritis 06/21/2010  . Hyperlipidemia 05/31/2009  . DIVERTICULOSIS, COLON 10/16/2008  . AORTIC STENOSIS, MILD 06/02/2008  . Diabetes mellitus type 2, controlled, without complications ([place]) 04/79/9872  . Obesity (BMI 30-39.9) 12/06/2006  . SICKLE CELL TRAIT 12/06/2006  . HYPERTENSION, BENIGN SYSTEMIC 12/06/2006  . CORONARY, ARTERIOSCLEROSIS 12/06/2006    Starr Lake PT, DPT, LAT, ATC  03/16/21  10:17 AM      [facility] [address] [place], [place], [address] Phone: [phone]   Fax:  [phone]  Name: Chantal Worthey MRN: [id number] Date of Birth: 11-23-1950

## 2021-03-18 ENCOUNTER — Encounter: Payer: Medicare Other | Admitting: Physical Therapy

## 2021-03-23 ENCOUNTER — Encounter: Payer: Medicare Other | Admitting: Physical Therapy

## 2021-03-25 ENCOUNTER — Encounter: Payer: Medicare Other | Admitting: Physical Therapy

## 2021-04-04 ENCOUNTER — Telehealth: Payer: Self-pay

## 2021-04-04 ENCOUNTER — Other Ambulatory Visit: Payer: Self-pay | Admitting: Physician Assistant

## 2021-04-04 ENCOUNTER — Telehealth: Payer: Self-pay | Admitting: Orthopaedic Surgery

## 2021-04-04 MED ORDER — TRAMADOL HCL 50 MG PO TABS: 50.0000 mg | ORAL_TABLET | Freq: Two times a day (BID) | ORAL | 0 refills | Status: DC | PRN

## 2021-04-04 NOTE — Telephone Encounter (Signed)
Patient returns call to nurse line. Patient informed she will have to purchase medication OTC due to insurance.

## 2021-04-04 NOTE — Telephone Encounter (Signed)
Sent in

## 2021-04-04 NOTE — Telephone Encounter (Signed)
Patient calls nurse line regarding issues with picking up cetirizine rx. Called pharmacy. Pharmacist reports that insurance does not cover medication and that patient can pick up OTC.   Attempted to call patient. No answer, left VM for patient to return call to office.   Veronda Prude, RN

## 2021-04-04 NOTE — Telephone Encounter (Signed)
Patient aware.

## 2021-04-04 NOTE — Telephone Encounter (Signed)
Pt called and would like refill on tramadol.

## 2021-04-14 ENCOUNTER — Ambulatory Visit: Payer: Medicare Other | Attending: Physician Assistant | Admitting: Physical Therapy

## 2021-04-14 ENCOUNTER — Encounter: Payer: Self-pay | Admitting: Physical Therapy

## 2021-04-14 ENCOUNTER — Other Ambulatory Visit: Payer: Self-pay

## 2021-04-14 DIAGNOSIS — G8929 Other chronic pain: Secondary | ICD-10-CM | POA: Insufficient documentation

## 2021-04-14 DIAGNOSIS — M25662 Stiffness of left knee, not elsewhere classified: Secondary | ICD-10-CM | POA: Insufficient documentation

## 2021-04-14 DIAGNOSIS — R269 Unspecified abnormalities of gait and mobility: Secondary | ICD-10-CM | POA: Diagnosis not present

## 2021-04-14 DIAGNOSIS — M25661 Stiffness of right knee, not elsewhere classified: Secondary | ICD-10-CM | POA: Diagnosis not present

## 2021-04-14 DIAGNOSIS — M6281 Muscle weakness (generalized): Secondary | ICD-10-CM | POA: Diagnosis not present

## 2021-04-14 DIAGNOSIS — R600 Localized edema: Secondary | ICD-10-CM | POA: Insufficient documentation

## 2021-04-14 DIAGNOSIS — R2689 Other abnormalities of gait and mobility: Secondary | ICD-10-CM | POA: Insufficient documentation

## 2021-04-14 DIAGNOSIS — M25561 Pain in right knee: Secondary | ICD-10-CM | POA: Diagnosis not present

## 2021-04-14 DIAGNOSIS — M25562 Pain in left knee: Secondary | ICD-10-CM | POA: Diagnosis not present

## 2021-04-14 NOTE — Therapy (Addendum)
[place], [place], [address] Phone: [phone]   Fax:  [phone]  Physical Therapy Treatment / Discharge note  Patient Details  Name: Terri Estrada MRN: [id number] Date of Birth: Jun 26, 1951 Referring Provider (PT): Jacqualine Mau PA-C   Encounter Date: 04/14/2021   PT End of Session - 04/14/21 0807     Visit Number 12    Number of Visits 16    Date for PT Re-Evaluation 03/26/21    Authorization Type UHC MCR/MCD    PT Start Time 0800    PT Stop Time 0843    PT Time Calculation (min) 43 min    Activity Tolerance Patient tolerated treatment well    Behavior During Therapy [facility] for tasks assessed/performed             Past Medical History:  Diagnosis Date   Arthritis    CAD in native artery    a. MI 2001 s/p PTCA/stenting to mid-distal junction of RCA - residual dx treated medically.   Depression    pt reports having it intermittently   Diverticulosis of colon (without mention of hemorrhage)    Essential hypertension, benign    MI (myocardial infarction) ([place])    Mild pulmonary hypertension (HCC)    Obesity, unspecified    Onychia and paronychia of toe    Osteoarthrosis, unspecified whether generalized or localized, unspecified site    Other and unspecified hyperlipidemia    PONV (postoperative nausea and vomiting)    Sickle-cell trait (HCC)    Sinus bradycardia    a. HR 40s even on low dose metoprolol - BB stopped with resolution.   Streptococcal sore throat    Type II or unspecified type diabetes mellitus without mention of complication, not stated as uncontrolled     Past Surgical History:  Procedure Laterality Date   ANGIOPLASTY  09/21/00   CARDIAC CATHETERIZATION     JOINT REPLACEMENT     TOTAL KNEE ARTHROPLASTY Right 02/23/2020   Procedure: RIGHT TOTAL KNEE ARTHROPLASTY;  Surgeon: Leandrew Koyanagi, MD;  Location: [place];  Service: Orthopedics;  Laterality: Right;   TOTAL KNEE  ARTHROPLASTY Left 12/27/2020   Procedure: LEFT TOTAL KNEE ARTHROPLASTY;  Surgeon: Leandrew Koyanagi, MD;  Location: [place];  Service: Orthopedics;  Laterality: Left;    There were no vitals filed for this visit.   Subjective Assessment - 04/14/21 0800     Subjective I havent been here in awhile. I got a cold and my allergies flared up. I dont like the sorenss and stiffness in both knees when I do not get my walk in. I walk with my sister and I do my exercises afterward.    Currently in Pain? No/denies                [facility] PT Assessment - 04/14/21 0001       Observation/Other Assessments   Focus on Therapeutic Outcomes (FOTO)  64% (at best 75%)      AROM   Left Knee Extension 1    Left Knee Flexion 114   106 at beginning of session                          [facility] Adult PT Treatment/Exercise - 04/14/21 0001       Knee/Hip Exercises: Stretches   Other Knee/Hip Stretches Quad and hamstring stretch on stairs using bilateral UE to assist with completing HEP at walking  track      Knee/Hip Exercises: Aerobic   Recumbent Bike L2 x 5 minutes      Knee/Hip Exercises: Machines for Strengthening   Cybex Knee Extension 1 set bilat , then 1 set with Left eccentric lowering    Cybex Knee Flexion 1 set bilat, then 1 set with left eccentric    Cybex Leg Press 40# 15 x 2, then 1 set with L eccentric lowering      Knee/Hip Exercises: Standing   Heel Raises 10 reps    Knee Flexion 10 reps    Functional Squat 1 set    SLS trials of tandem and SLS for HEP review    Other Standing Knee Exercises hip hinge chair squat taps using wooden dowel for feedback 10 x2      Knee/Hip Exercises: Supine   Straight Leg Raises 2 sets;10 reps   combined with sustained quad set                     PT Short Term Goals - 04/14/21 0834       PT SHORT TERM GOAL #1   Title pt to be I with intial HEP    Time 4    Period Weeks    Status Achieved    Target Date 02/22/21      PT  SHORT TERM GOAL #2   Title Patient will demonstrate full extension    Baseline lacks 1 to 3 degrees at most    Time 4    Period Weeks    Status Partially Met      PT SHORT TERM GOAL #3   Title Patient will improve knee flexion to 110 degrees with mild pain    Baseline 110    Time 4    Period Weeks    Status Achieved    Target Date 02/22/21      PT SHORT TERM GOAL #4   Title Therapy will review FOTO with paitient    Baseline reviewed today 51% function    Time 1    Period Weeks    Status Achieved    Target Date 02/01/21               PT Long Term Goals - 04/14/21 0835       PT LONG TERM GOAL #1   Title Patient will walk 1 mile without reports of pain    Baseline 1 mile at walking track 5 days a week    Time 8    Period Weeks    Status Achieved      PT LONG TERM GOAL #2   Title Patient will go up/down 9 steps without pain    Baseline going up and down 25 stairs at walking track for exercise.    Time 8    Period Weeks    Status Achieved      PT LONG TERM GOAL #3   Title Patient will bend down to pick object up off the floor safely and without increased pain    Baseline able to pick up timer in clinic from floor without pain of difficulty    Time 8    Period Weeks    Status Achieved      PT LONG TERM GOAL #4   Title increase FOTO score to </= 28% limited to demo improvement in function    Baseline at best 25% limited , then last survey 36% limited    Time 8  Period Weeks    Status Partially Met      PT LONG TERM GOAL #5   Title pt to be I with all HEP given to maintain and progress current level of function    Baseline reviewed advanced HEP at discharge    Time 8    Period Weeks    Status Achieved                   Plan - 04/14/21 0850     Clinical Impression Statement Terri Estrada arrives after 4 week absence from physical therapy due to provider cancelations and sickness. She reports she has been doing well and has resumed walking at the  track for 1 mile 5 times per week. She was instructed in exercises she could do while she waits on her sister to walk a second mile. She is walking up and down 25 stairs 5 days a week as well. Her AROM has improved to 1-114 left knee flexion. She has met or partially met all LTGs and is agreeable to discharge to HEP.    PT Next Visit Plan discharge to HEP today    PT Home Exercise Plan [phone] quad sets, knee extension stretch; knee flexion stretch             Patient will benefit from skilled therapeutic intervention in order to improve the following deficits and impairments:  Abnormal gait, Decreased range of motion, Difficulty walking, Pain, Decreased strength, Decreased skin integrity, Decreased activity tolerance, Increased muscle spasms, Decreased endurance, Decreased safety awareness, Increased edema, Decreased mobility  Visit Diagnosis: Stiffness of left knee, not elsewhere classified  Acute pain of left knee  Other abnormalities of gait and mobility  Abnormal gait  Localized edema  Muscle weakness (generalized)  Stiffness of right knee, not elsewhere classified  Chronic pain of right knee     Problem List Patient Active Problem List   Diagnosis Date Noted   Status post total left knee replacement 12/27/2020   Primary osteoarthritis of left knee 12/26/2020   Fatigue 06/18/2020   Status post total right knee replacement 02/23/2020   Acute seasonal allergic rhinitis 12/19/2019   Pre-op evaluation 10/23/2019   Murmur 12/10/2015   Pes planus of both feet 01/07/2015   Depression 01/07/2015   Female cystocele 06/26/2014   Healthcare maintenance 12/17/2012   Osteoarthritis 06/21/2010   Hyperlipidemia 05/31/2009   DIVERTICULOSIS, COLON 10/16/2008   AORTIC STENOSIS, MILD 06/02/2008   Diabetes mellitus type 2, controlled, without complications ([place]) 54/06/8118   Obesity (BMI 30-39.9) 12/06/2006   SICKLE CELL TRAIT 12/06/2006   HYPERTENSION, BENIGN SYSTEMIC  12/06/2006   CORONARY, ARTERIOSCLEROSIS 12/06/2006    Dorene Ar, PTA 04/14/2021, 10:45 AM  [facility] [address] [place], [place], [address] Phone: [phone]   Fax:  [phone]  Name: Terri Estrada MRN: [id number] Date of Birth: May 05, 1951     PHYSICAL THERAPY DISCHARGE SUMMARY  Visits from Start of Care: 12  Current functional level related to goals / functional outcomes: See goals   Remaining deficits: See assessment   Education / Equipment: HEP,theraband   Patient agrees to discharge. Patient goals were met. Patient is being discharged due to being pleased with the current functional level.  Kristoffer Leamon PT, DPT, LAT, ATC  04/19/21  1:35 PM

## 2021-04-19 NOTE — Addendum Note (Signed)
Addended by: Milford Cage on: 04/19/2021 01:36 PM   Modules accepted: Orders

## 2021-04-28 NOTE — Progress Notes (Signed)
    SUBJECTIVE:   CHIEF COMPLAINT / HPI: Physical  CAD Current medications include aspirin 81 mg, Imdur.  Patient also takes atorvastatin 80 mg and Zetia.  Follows with [facility] for cardiology.  Hypertension Current medications include amlodipine 10 mg, Cozaar 25mg , and hydrochlorothiazide. Patient reports she has been able to walk more after her knee replacement. She has not taken her BP medications today.   Type 2 diabetes Current Regimen: diet controlled  Patient has not used blood glucose testing in the last year   Last A1c:  Lab Results  Component Value Date   HGBA1C 5.7 04/29/2021   Denies polyuria, polydipsia, hypoglycemia.  Last Eye Exam:  Statin: Atorvastatin 80 mg ACE/ARB: Lisinopril  Healthcare maintenance Patient due for fourth COVID booster.  Last COVID booster was December 2021, Pfizer. Patient would like to have this vaccine today.   PERTINENT  PMH / PSH:  Hypertension Aortic stenosis CAD Diverticulosis Type 2 diabetes  OBJECTIVE:   BP (!) 152/79   Pulse 75   Wt 188 lb 9.6 oz (85.5 kg)   SpO2 98%   BMI 34.50 kg/m   General: elderly female appearing stated age in no acute distress Cardio: Normal S1 and S2, no S3 or S4. Rhythm is regular. No murmurs or rubs.  Bilateral radial pulses palpable Pulm: Clear to auscultation bilaterally, no crackles, wheezing, or diminished breath sounds. Normal respiratory effort Abdomen: Bowel sounds normal. Abdomen soft and non-tender.  Extremities: No peripheral edema. Warm & well perfused.  Neuro: pt alert and oriented x4, follows commands, PERRLA, EOMI bilaterally  ASSESSMENT/PLAN:   Diabetes mellitus type 2, controlled, without complications Repeat hemoglobin A1c  Urine microalbumin per patient request  U/A per pt request to follow up on protein  DM diet controlled  Refilled testing supplies   HYPERTENSION, BENIGN SYSTEMIC BMP  Patient has not had BP medication today, BP elevated in office   Continue current medications as prescribed   Hyperlipidemia Lipid panel      06-23-1991, MD [facility]

## 2021-04-29 ENCOUNTER — Ambulatory Visit (INDEPENDENT_AMBULATORY_CARE_PROVIDER_SITE_OTHER): Payer: Medicare Other | Admitting: Family Medicine

## 2021-04-29 ENCOUNTER — Encounter: Payer: Self-pay | Admitting: Family Medicine

## 2021-04-29 ENCOUNTER — Other Ambulatory Visit: Payer: Self-pay

## 2021-04-29 ENCOUNTER — Ambulatory Visit (INDEPENDENT_AMBULATORY_CARE_PROVIDER_SITE_OTHER): Payer: Medicare Other

## 2021-04-29 VITALS — BP 152/79 | HR 75 | Wt 188.6 lb

## 2021-04-29 DIAGNOSIS — Z23 Encounter for immunization: Secondary | ICD-10-CM

## 2021-04-29 DIAGNOSIS — I10 Essential (primary) hypertension: Secondary | ICD-10-CM | POA: Diagnosis not present

## 2021-04-29 DIAGNOSIS — R350 Frequency of micturition: Secondary | ICD-10-CM | POA: Diagnosis not present

## 2021-04-29 DIAGNOSIS — E785 Hyperlipidemia, unspecified: Secondary | ICD-10-CM | POA: Diagnosis not present

## 2021-04-29 DIAGNOSIS — E119 Type 2 diabetes mellitus without complications: Secondary | ICD-10-CM

## 2021-04-29 LAB — POCT URINALYSIS DIP (MANUAL ENTRY)
Bilirubin, UA: NEGATIVE
Blood, UA: NEGATIVE
Glucose, UA: NEGATIVE mg/dL
Ketones, POC UA: NEGATIVE mg/dL
Leukocytes, UA: NEGATIVE
Nitrite, UA: NEGATIVE
Protein Ur, POC: NEGATIVE mg/dL
Spec Grav, UA: 1.015 (ref 1.010–1.025)
Urobilinogen, UA: 0.2 E.U./dL
pH, UA: 6.5 (ref 5.0–8.0)

## 2021-04-29 LAB — POCT UA - MICROALBUMIN
Creatinine, POC: 50 mg/dL
Microalbumin Ur, POC: 10 mg/L

## 2021-04-29 LAB — POCT GLYCOSYLATED HEMOGLOBIN (HGB A1C): HbA1c, POC (controlled diabetic range): 5.7 % (ref 0.0–7.0)

## 2021-04-29 MED ORDER — ACCU-CHEK SOFTCLIX LANCETS MISC: 12 refills | Status: DC

## 2021-04-29 MED ORDER — GLUCOSE BLOOD VI STRP: ORAL_STRIP | 12 refills | Status: DC

## 2021-04-29 NOTE — Patient Instructions (Signed)
It was a pleasure to see you today!  Thank you for choosing [facility] for your primary care.   Terri Estrada was seen for physical.   Our plans for today were: We will collect blood work to check your kidney levels and electrolytes.  We will also check your cholesterol levels to make sure you are still in goal range. We will check your hemoglobin A1c.  As requested we will do your COVID vaccine booster.  I will follow up with you with any abnormal results.     You should return to our clinic in 3 months for diabetes & blood pressure follow up.   Best Wishes,   Dr. Neita Garnet

## 2021-04-30 LAB — LIPID PANEL
Chol/HDL Ratio: 2.3 ratio (ref 0.0–4.4)
Cholesterol, Total: 137 mg/dL (ref 100–199)
HDL: 59 mg/dL (ref >39)
LDL Chol Calc (NIH): 64 mg/dL (ref 0–99)
Triglycerides: 70 mg/dL (ref 0–149)
VLDL Cholesterol Cal: 14 mg/dL (ref 5–40)

## 2021-04-30 LAB — BASIC METABOLIC PANEL
BUN/Creatinine Ratio: 15 (ref 12–28)
BUN: 12 mg/dL (ref 8–27)
CO2: 26 mmol/L (ref 20–29)
Calcium: 9.8 mg/dL (ref 8.7–10.3)
Chloride: 97 mmol/L (ref 96–106)
Creatinine, Ser: 0.8 mg/dL (ref 0.57–1.00)
Glucose: 95 mg/dL (ref 65–99)
Potassium: 4.2 mmol/L (ref 3.5–5.2)
Sodium: 137 mmol/L (ref 134–144)
eGFR: 80 mL/min/{1.73_m2} (ref >59)

## 2021-05-01 NOTE — Assessment & Plan Note (Signed)
BMP  Patient has not had BP medication today, BP elevated in office  Continue current medications as prescribed

## 2021-05-01 NOTE — Assessment & Plan Note (Signed)
Lipid panel 

## 2021-05-01 NOTE — Assessment & Plan Note (Signed)
Repeat hemoglobin A1c  Urine microalbumin per patient request  U/A per pt request to follow up on protein  DM diet controlled  Refilled testing supplies

## 2021-05-03 ENCOUNTER — Telehealth: Payer: Self-pay

## 2021-05-03 NOTE — Telephone Encounter (Signed)
Patient called she is requesting a rx refill for tramadol call back:[phone]

## 2021-05-04 ENCOUNTER — Other Ambulatory Visit: Payer: Self-pay | Admitting: Physician Assistant

## 2021-05-04 MED ORDER — TRAMADOL HCL 50 MG PO TABS: 50.0000 mg | ORAL_TABLET | Freq: Two times a day (BID) | ORAL | 0 refills | Status: DC | PRN

## 2021-05-04 NOTE — Telephone Encounter (Signed)
Sent in

## 2021-05-11 ENCOUNTER — Ambulatory Visit (INDEPENDENT_AMBULATORY_CARE_PROVIDER_SITE_OTHER): Payer: Medicare Other | Admitting: Orthopaedic Surgery

## 2021-05-11 ENCOUNTER — Ambulatory Visit: Payer: Self-pay

## 2021-05-11 ENCOUNTER — Other Ambulatory Visit: Payer: Self-pay

## 2021-05-11 DIAGNOSIS — Z96652 Presence of left artificial knee joint: Secondary | ICD-10-CM

## 2021-05-11 NOTE — Progress Notes (Signed)
Post-Op Visit Note   Patient: Terri Estrada           Date of Birth: December 12, 1950           MRN: [id number] Visit Date: 05/11/2021 PCP: Ronnald Ramp, MD   Assessment & Plan:  Chief Complaint:  Chief Complaint  Patient presents with   Left Knee - Pain, Follow-up   Visit Diagnoses:  1. Hx of total knee replacement, left     Plan: Patient is a pleasant 70 year old female who comes in today 5 months status post left total knee replacement 12/27/2020.  She has been doing well.  She has continued to work out at [employer]  Minimal pain.  Examination of her left knee reveals a fully healed surgical scar without complication.  Range of motion 0 to 100 degrees.  Stable to valgus varus stress.  She is neurovascular intact distally.  At this point, she will continue to advance with activity as tolerated.  Follow-up with [place] in 5 months time for repeat evaluation.  Dental prophylaxis reinforced.  Call with concerns or questions.  Follow-Up Instructions: Return in about 4 months (around 09/10/2021).   Orders:  Orders Placed This Encounter  Procedures   XR Knee 1-2 Views Left   No orders of the defined types were placed in this encounter.   Imaging: XR Knee 1-2 Views Left  Result Date: 05/11/2021 Well-seated prosthesis without complication   PMFS History: Patient Active Problem List   Diagnosis Date Noted   Status post total left knee replacement 12/27/2020   Primary osteoarthritis of left knee 12/26/2020   Fatigue 06/18/2020   Status post total right knee replacement 02/23/2020   Acute seasonal allergic rhinitis 12/19/2019   Pre-op evaluation 10/23/2019   Murmur 12/10/2015   Pes planus of both feet 01/07/2015   Depression 01/07/2015   Female cystocele 06/26/2014   Healthcare maintenance 12/17/2012   Osteoarthritis 06/21/2010   Hyperlipidemia 05/31/2009   DIVERTICULOSIS, COLON 10/16/2008   AORTIC STENOSIS, MILD 06/02/2008   Diabetes mellitus type 2, controlled,  without complications (HCC) 12/06/2006   Obesity (BMI 30-39.9) 12/06/2006   SICKLE CELL TRAIT 12/06/2006   HYPERTENSION, BENIGN SYSTEMIC 12/06/2006   CORONARY, ARTERIOSCLEROSIS 12/06/2006   Past Medical History:  Diagnosis Date   Arthritis    CAD in native artery    a. MI 2001 s/p PTCA/stenting to mid-distal junction of RCA - residual dx treated medically.   Depression    pt reports having it intermittently   Diverticulosis of colon (without mention of hemorrhage)    Essential hypertension, benign    MI (myocardial infarction) (HCC)    Mild pulmonary hypertension (HCC)    Obesity, unspecified    Onychia and paronychia of toe    Osteoarthrosis, unspecified whether generalized or localized, unspecified site    Other and unspecified hyperlipidemia    PONV (postoperative nausea and vomiting)    Sickle-cell trait (HCC)    Sinus bradycardia    a. HR 40s even on low dose metoprolol - BB stopped with resolution.   Streptococcal sore throat    Type II or unspecified type diabetes mellitus without mention of complication, not stated as uncontrolled     Family History  Problem Relation Age of Onset   Sickle cell anemia Daughter    Hypertension Mother    Diabetes type II Mother    Diabetes type II Sister    Breast cancer Sister    Cancer Other    Stroke Other  Diabetes Other     Past Surgical History:  Procedure Laterality Date   ANGIOPLASTY  09/21/00   CARDIAC CATHETERIZATION     JOINT REPLACEMENT     TOTAL KNEE ARTHROPLASTY Right 02/23/2020   Procedure: RIGHT TOTAL KNEE ARTHROPLASTY;  Surgeon: Tarry Kos, MD;  Location: [facility];  Service: Orthopedics;  Laterality: Right;   TOTAL KNEE ARTHROPLASTY Left 12/27/2020   Procedure: LEFT TOTAL KNEE ARTHROPLASTY;  Surgeon: Tarry Kos, MD;  Location: [facility];  Service: Orthopedics;  Laterality: Left;   Social History   Occupational History   Occupation: DAY Associate Professor: SELF-EMPLOYED  Tobacco Use   Smoking status: Former    Smokeless tobacco: Never   Tobacco comments:    quit nov 2001  Vaping Use   Vaping Use: Never used  Substance and Sexual Activity   Alcohol use: No   Drug use: No   Sexual activity: Yes    Birth control/protection: Post-menopausal

## 2021-06-02 IMAGING — CR DG CHEST 2V
2 series · 2 of 2 positions shown · non-contrast
Comparison: Chest x-ray 02/19/2020.

CLINICAL DATA: 69-year-old female under preoperative evaluation
prior to knee surgery.

EXAM:
CHEST - 2 VIEW

[w chest pa]
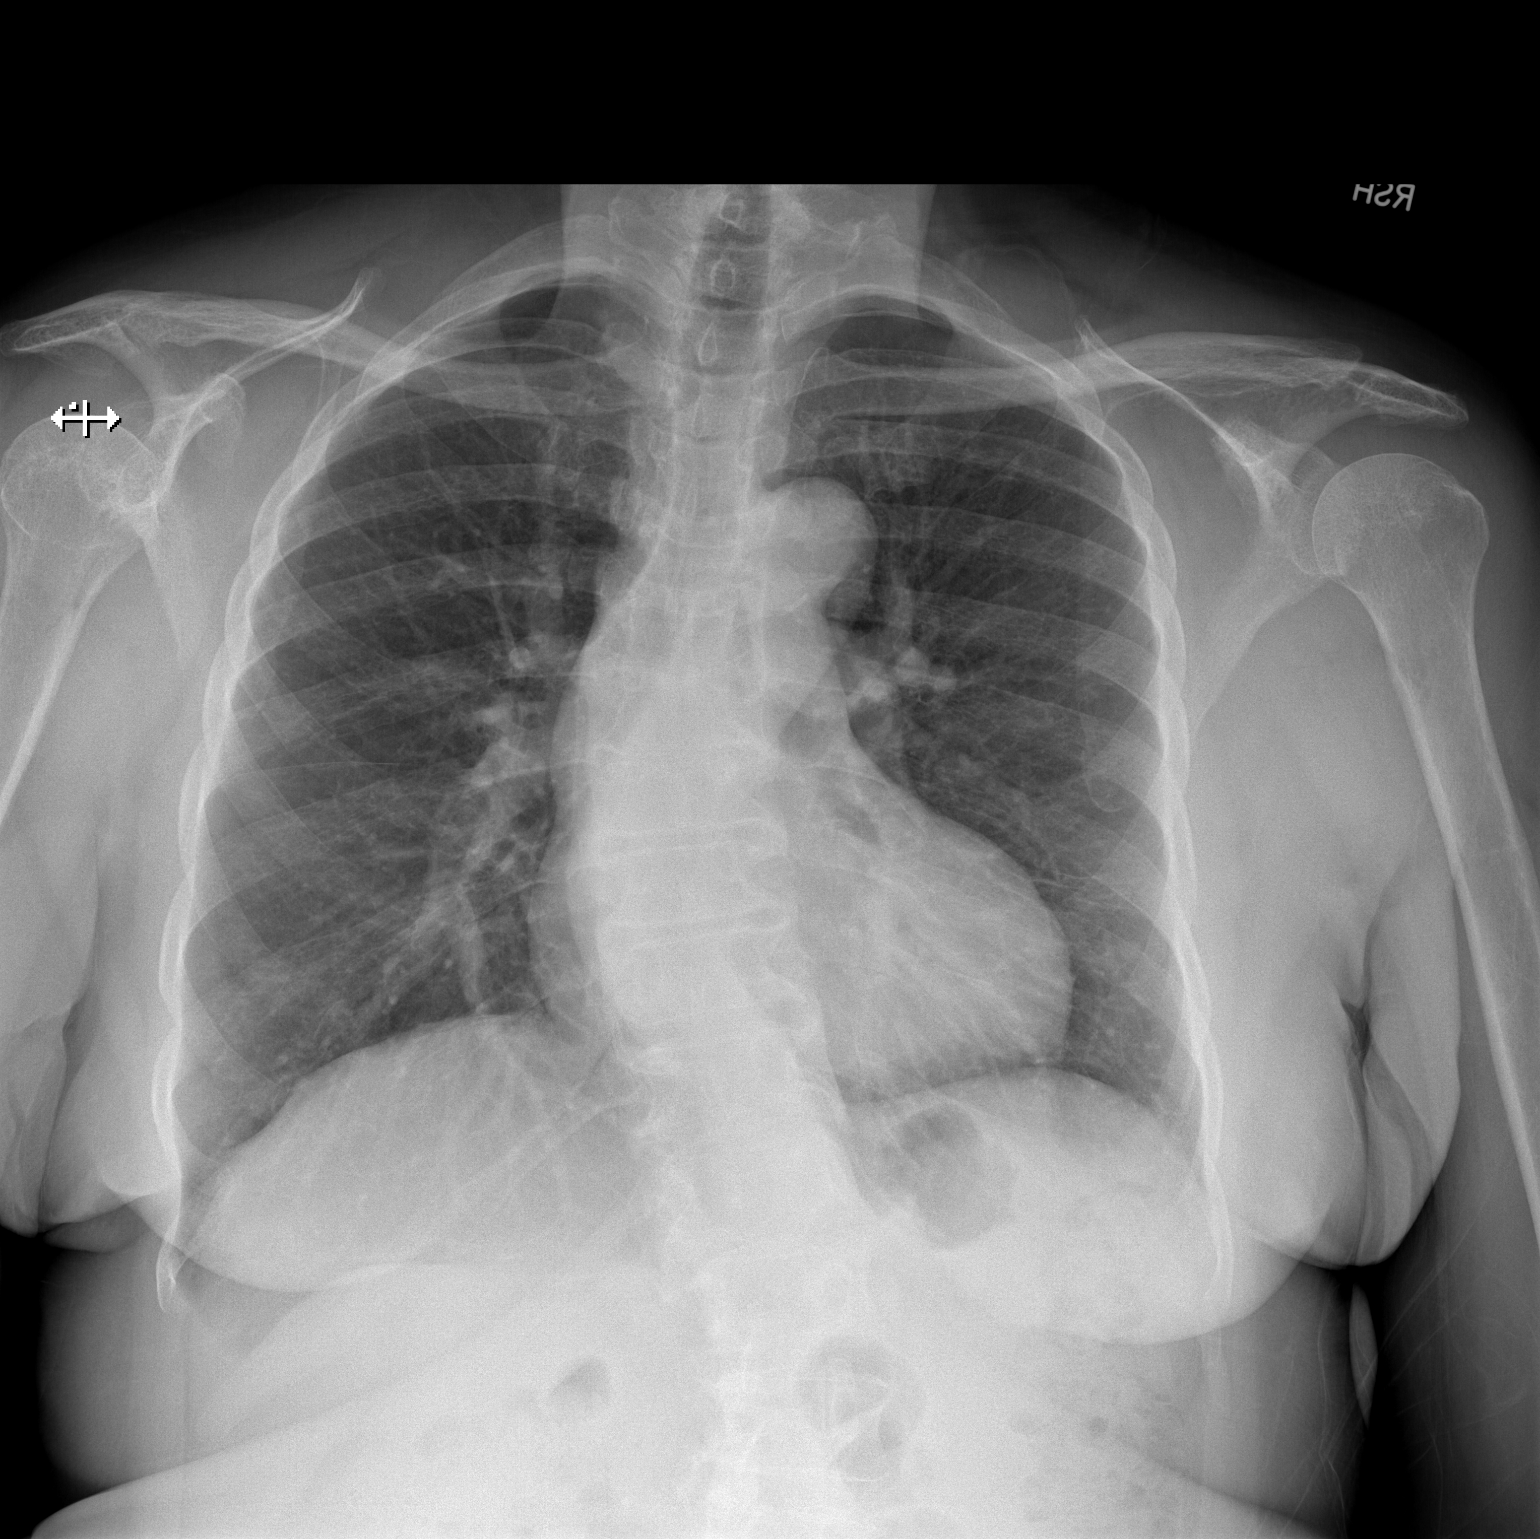

[w chest lat]
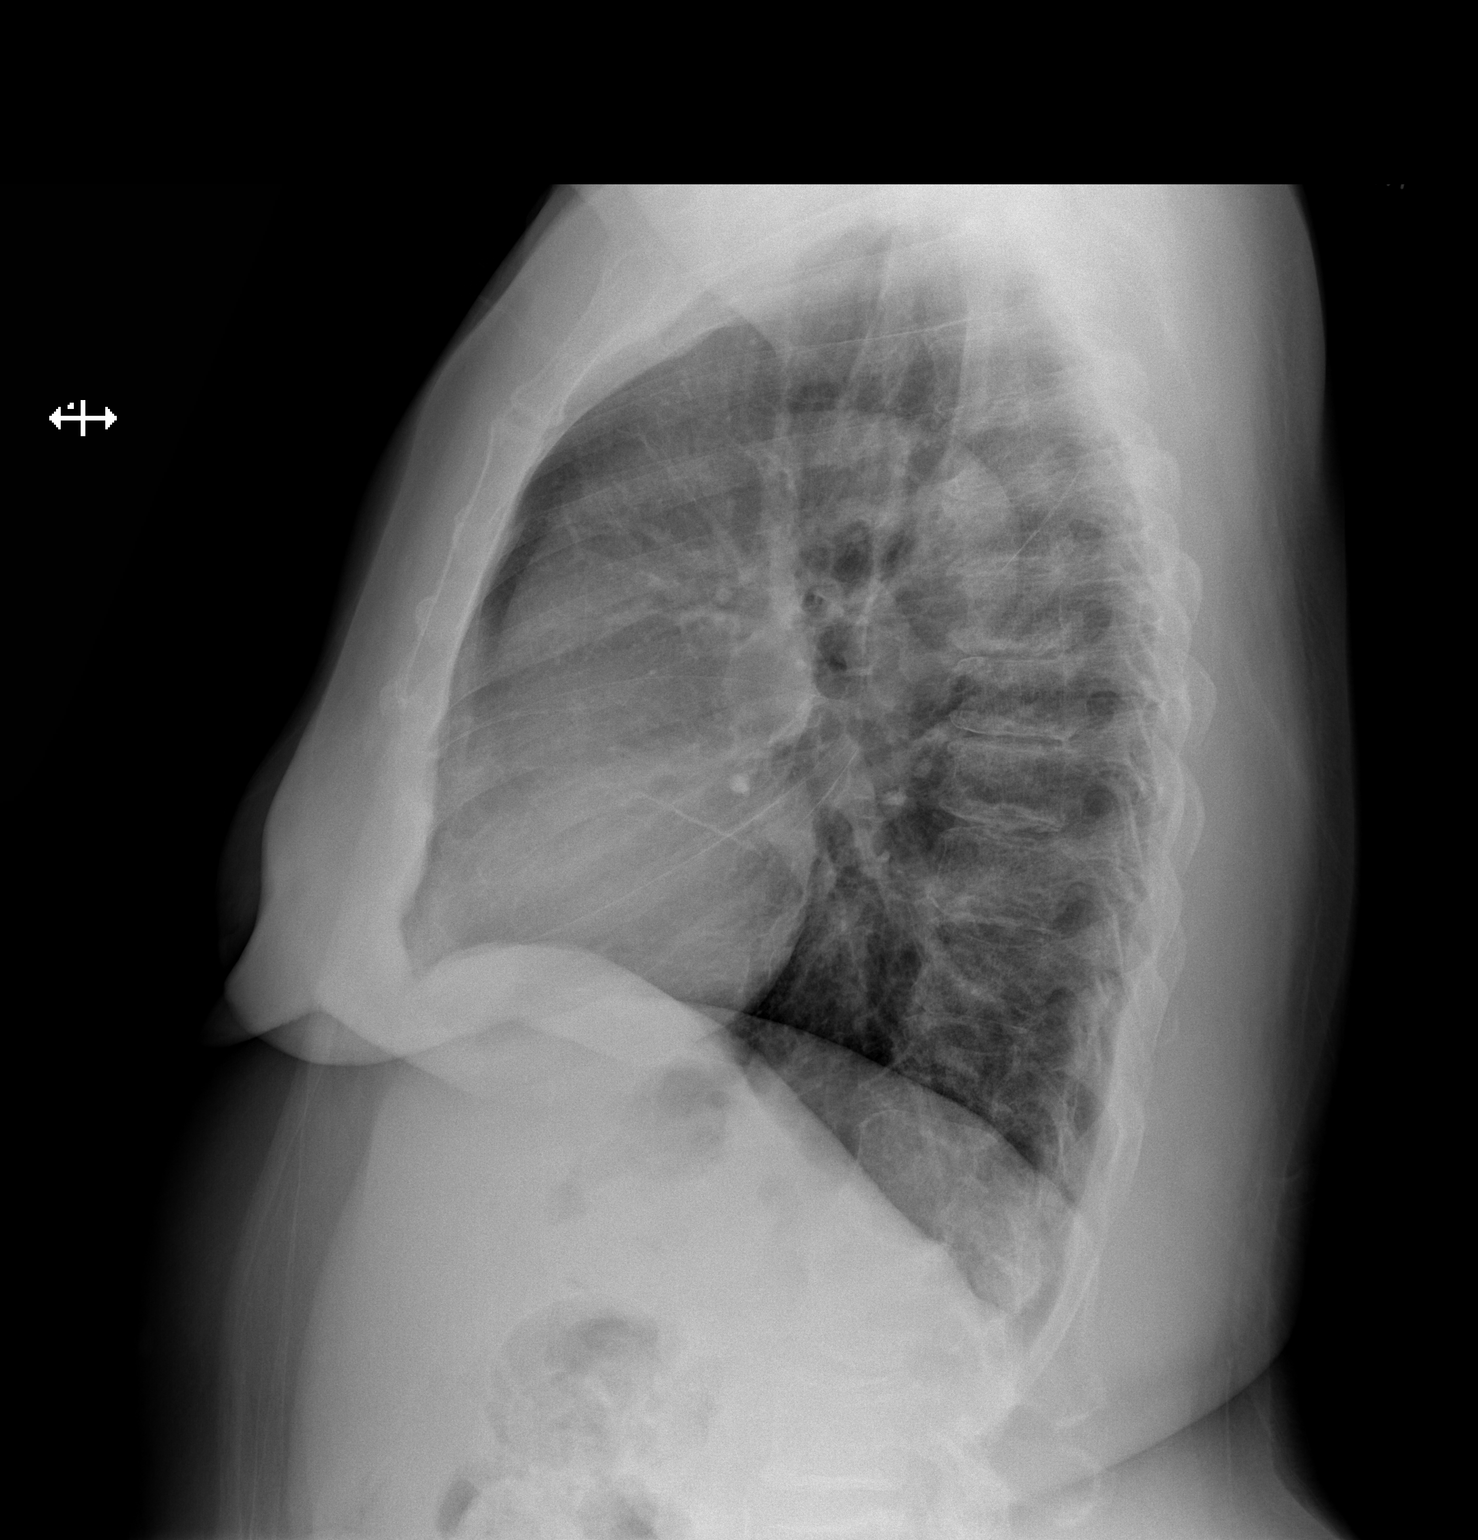

[2 of 2 positions shown; findings below may reference images not displayed]

FINDINGS: Lung volumes are normal. No consolidative airspace disease. No
pleural effusions. No pneumothorax. No pulmonary nodule or mass
noted. Pulmonary vasculature and the cardiomediastinal silhouette
are within normal limits. Atherosclerotic calcifications in the
thoracic aorta.
IMPRESSION: 1.  No radiographic evidence of acute cardiopulmonary disease.
2. Aortic atherosclerosis.

## 2021-06-03 ENCOUNTER — Other Ambulatory Visit: Payer: Self-pay | Admitting: Physician Assistant

## 2021-06-03 ENCOUNTER — Telehealth: Payer: Self-pay | Admitting: Orthopaedic Surgery

## 2021-06-03 MED ORDER — TRAMADOL HCL 50 MG PO TABS: 50.0000 mg | ORAL_TABLET | Freq: Two times a day (BID) | ORAL | 0 refills | Status: DC | PRN

## 2021-06-03 NOTE — Telephone Encounter (Signed)
Patient called. She would like Tramadol called in for her. Her call back number is [phone]

## 2021-06-03 NOTE — Telephone Encounter (Signed)
Sent in

## 2021-06-06 IMAGING — DX DG KNEE 1-2V PORT*L*
2 series · 2 of 2 positions shown · non-contrast
Comparison: 07/11/2012

CLINICAL DATA: Postop for left total knee replacement

EXAM:
PORTABLE LEFT KNEE - 1-2 VIEW

[knee ap]
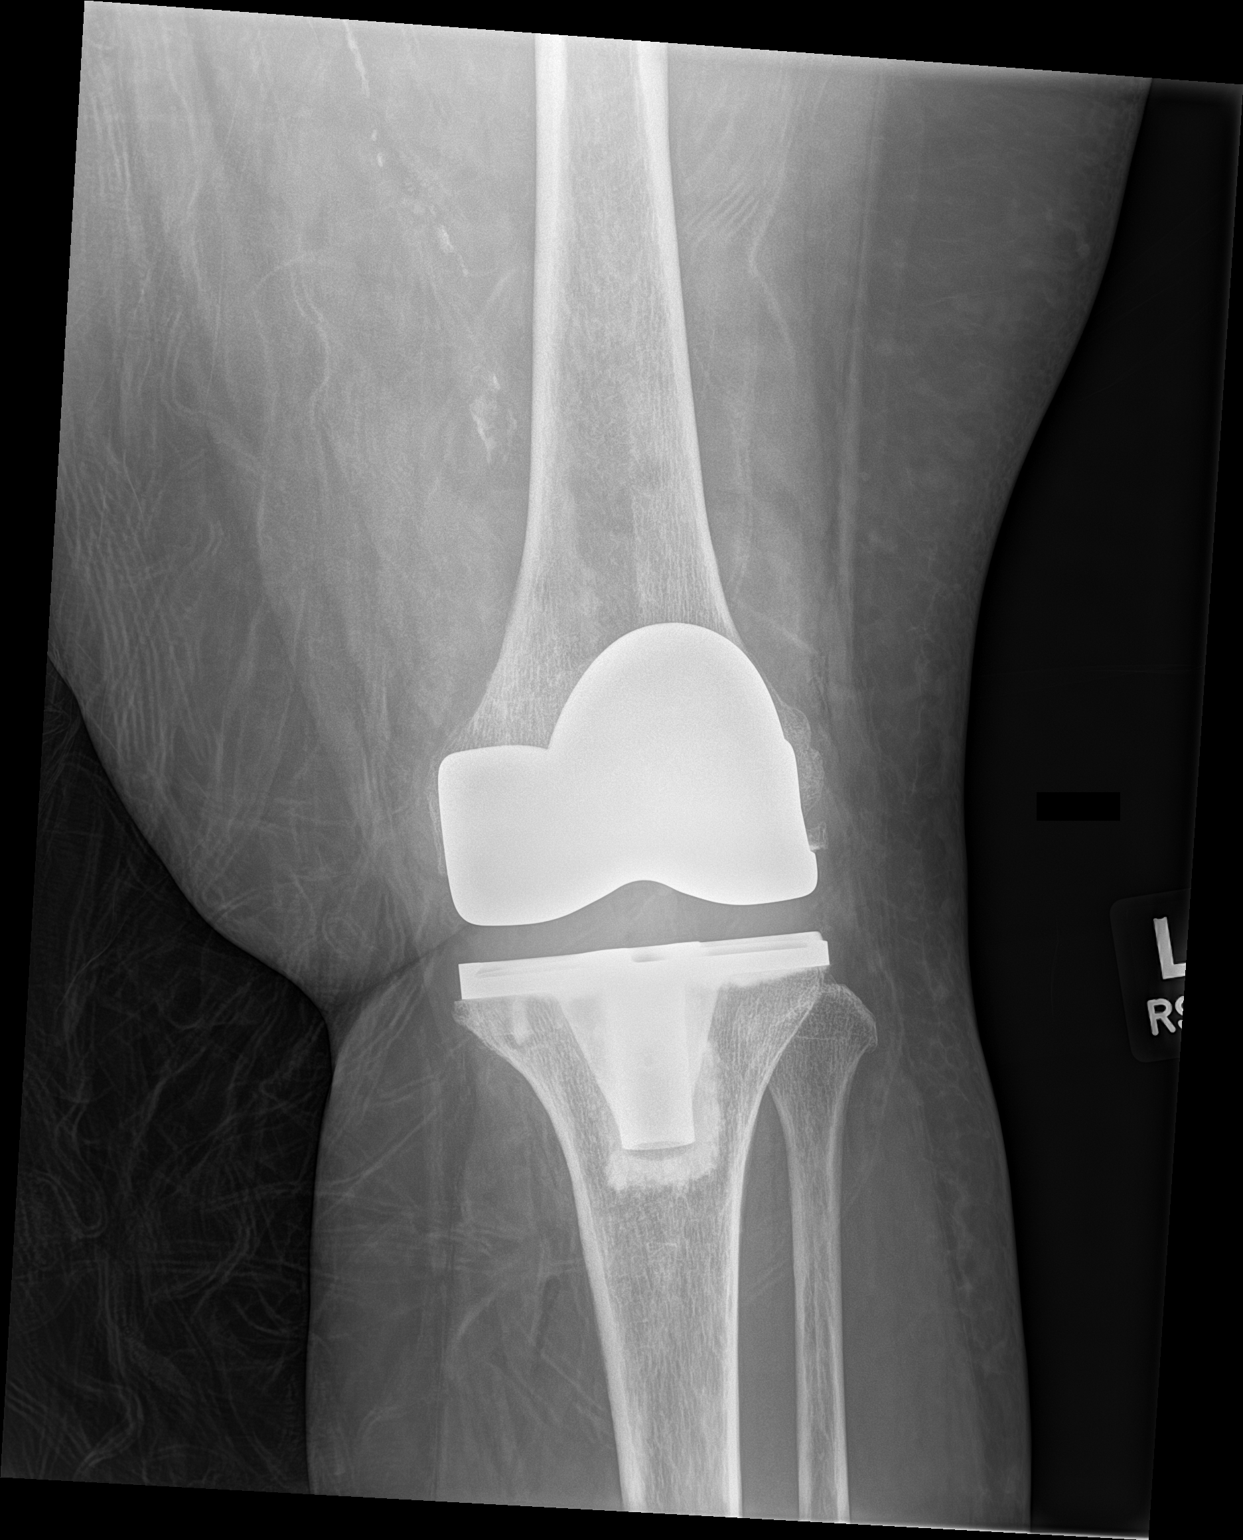

[knee lat]
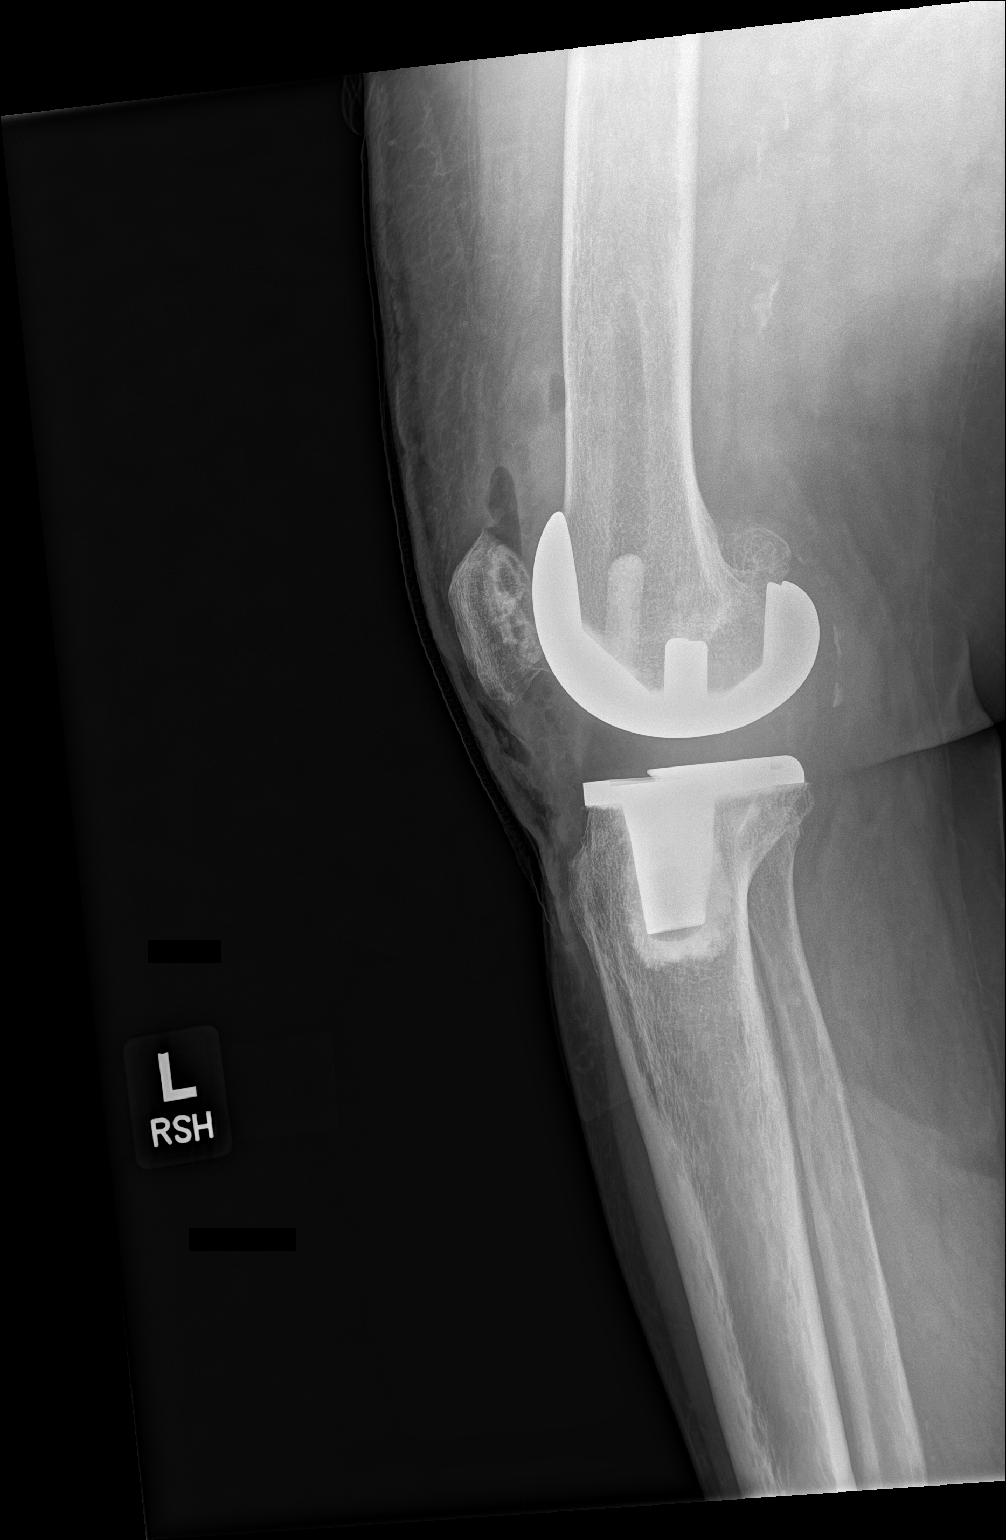

[2 of 2 positions shown; findings below may reference images not displayed]

FINDINGS: Total knee arthroplasty. Expected postoperative edema and gas about
the anterior joint. No periprosthetic fracture.
IMPRESSION: Expected appearance after left knee arthroplasty.

## 2021-06-14 ENCOUNTER — Telehealth: Payer: Self-pay | Admitting: Orthopaedic Surgery

## 2021-06-14 ENCOUNTER — Other Ambulatory Visit: Payer: Self-pay | Admitting: Physician Assistant

## 2021-06-14 MED ORDER — TRAMADOL HCL 50 MG PO TABS: 50.0000 mg | ORAL_TABLET | Freq: Three times a day (TID) | ORAL | 0 refills | Status: DC | PRN

## 2021-06-14 NOTE — Telephone Encounter (Signed)
Please advise 

## 2021-06-14 NOTE — Telephone Encounter (Signed)
Sent in

## 2021-06-14 NOTE — Telephone Encounter (Signed)
Pt called requesting a refill of tramadol. Please send to pharmacy on file. Pt phone number is [phone]. 

## 2021-06-14 NOTE — Telephone Encounter (Signed)
Called and advised pt.

## 2021-06-16 ENCOUNTER — Other Ambulatory Visit: Payer: Self-pay | Admitting: Physician Assistant

## 2021-06-21 ENCOUNTER — Other Ambulatory Visit: Payer: Self-pay

## 2021-06-21 DIAGNOSIS — E785 Hyperlipidemia, unspecified: Secondary | ICD-10-CM

## 2021-06-21 DIAGNOSIS — I10 Essential (primary) hypertension: Secondary | ICD-10-CM

## 2021-06-21 MED ORDER — HYDROCHLOROTHIAZIDE 25 MG PO TABS: 25.0000 mg | ORAL_TABLET | Freq: Every day | ORAL | 1 refills | Status: DC

## 2021-06-21 MED ORDER — LOSARTAN POTASSIUM 25 MG PO TABS: 25.0000 mg | ORAL_TABLET | Freq: Every day | ORAL | 0 refills | Status: DC

## 2021-06-21 MED ORDER — AMLODIPINE BESYLATE 10 MG PO TABS: 10.0000 mg | ORAL_TABLET | Freq: Every day | ORAL | 1 refills | Status: DC

## 2021-06-23 ENCOUNTER — Telehealth: Payer: Self-pay | Admitting: Orthopaedic Surgery

## 2021-06-23 DIAGNOSIS — H5203 Hypermetropia, bilateral: Secondary | ICD-10-CM | POA: Diagnosis not present

## 2021-06-23 DIAGNOSIS — H2513 Age-related nuclear cataract, bilateral: Secondary | ICD-10-CM | POA: Diagnosis not present

## 2021-06-23 DIAGNOSIS — E119 Type 2 diabetes mellitus without complications: Secondary | ICD-10-CM | POA: Diagnosis not present

## 2021-06-23 DIAGNOSIS — H524 Presbyopia: Secondary | ICD-10-CM | POA: Diagnosis not present

## 2021-06-23 LAB — HM DIABETES EYE EXAM

## 2021-06-23 NOTE — Telephone Encounter (Signed)
Patient called needing rx refilled (Tramadol)  Patient asked why does she have to call every week to get Rx filled? The number to contact patient is [phone]

## 2021-06-24 ENCOUNTER — Other Ambulatory Visit: Payer: Self-pay | Admitting: Physician Assistant

## 2021-06-24 MED ORDER — TRAMADOL HCL 50 MG PO TABS: 50.0000 mg | ORAL_TABLET | Freq: Two times a day (BID) | ORAL | 2 refills | Status: DC | PRN

## 2021-06-24 NOTE — Telephone Encounter (Signed)
Sent in tramadol, but she needs to wean off of this rx.  If she continues to need this, we will need to refer to pain management or she can get from pcp

## 2021-06-27 ENCOUNTER — Other Ambulatory Visit: Payer: Self-pay

## 2021-06-27 DIAGNOSIS — E785 Hyperlipidemia, unspecified: Secondary | ICD-10-CM

## 2021-06-27 DIAGNOSIS — I10 Essential (primary) hypertension: Secondary | ICD-10-CM

## 2021-06-27 MED ORDER — ATORVASTATIN CALCIUM 80 MG PO TABS: 80.0000 mg | ORAL_TABLET | Freq: Every day | ORAL | 0 refills | Status: DC

## 2021-06-27 MED ORDER — ISOSORBIDE MONONITRATE ER 30 MG PO TB24: ORAL_TABLET | ORAL | 0 refills | Status: DC

## 2021-06-29 ENCOUNTER — Encounter: Payer: Self-pay | Admitting: Family Medicine

## 2021-07-04 ENCOUNTER — Telehealth: Payer: Self-pay | Admitting: *Deleted

## 2021-07-04 NOTE — Telephone Encounter (Signed)
Left a message for the patient to call back. Her appointment with Dr. Royann Shivers for 9/27 will need to be rescheduled due to an emergency.

## 2021-07-05 ENCOUNTER — Ambulatory Visit: Payer: Medicare Other | Admitting: Cardiovascular Disease

## 2021-07-08 DIAGNOSIS — H903 Sensorineural hearing loss, bilateral: Secondary | ICD-10-CM | POA: Diagnosis not present

## 2021-07-20 ENCOUNTER — Other Ambulatory Visit: Payer: Self-pay | Admitting: Physician Assistant

## 2021-07-20 DIAGNOSIS — I10 Essential (primary) hypertension: Secondary | ICD-10-CM

## 2021-07-20 DIAGNOSIS — E785 Hyperlipidemia, unspecified: Secondary | ICD-10-CM

## 2021-07-26 ENCOUNTER — Other Ambulatory Visit: Payer: Self-pay | Admitting: Physician Assistant

## 2021-07-26 DIAGNOSIS — E785 Hyperlipidemia, unspecified: Secondary | ICD-10-CM

## 2021-07-26 DIAGNOSIS — I10 Essential (primary) hypertension: Secondary | ICD-10-CM

## 2021-08-19 ENCOUNTER — Other Ambulatory Visit: Payer: Self-pay | Admitting: Physician Assistant

## 2021-08-19 ENCOUNTER — Encounter: Payer: Self-pay | Admitting: Cardiovascular Disease

## 2021-08-19 ENCOUNTER — Ambulatory Visit (INDEPENDENT_AMBULATORY_CARE_PROVIDER_SITE_OTHER): Payer: Medicare Other | Admitting: Cardiovascular Disease

## 2021-08-19 ENCOUNTER — Other Ambulatory Visit: Payer: Self-pay

## 2021-08-19 VITALS — BP 130/70 | HR 54 | Ht 62.0 in | Wt 186.0 lb

## 2021-08-19 DIAGNOSIS — I10 Essential (primary) hypertension: Secondary | ICD-10-CM | POA: Diagnosis not present

## 2021-08-19 DIAGNOSIS — E7800 Pure hypercholesterolemia, unspecified: Secondary | ICD-10-CM

## 2021-08-19 DIAGNOSIS — E668 Other obesity: Secondary | ICD-10-CM | POA: Diagnosis not present

## 2021-08-19 DIAGNOSIS — R001 Bradycardia, unspecified: Secondary | ICD-10-CM

## 2021-08-19 DIAGNOSIS — I2510 Atherosclerotic heart disease of native coronary artery without angina pectoris: Secondary | ICD-10-CM | POA: Diagnosis not present

## 2021-08-19 MED ORDER — LOSARTAN POTASSIUM 25 MG PO TABS: ORAL_TABLET | ORAL | 3 refills | Status: DC

## 2021-08-19 MED ORDER — HYDROCHLOROTHIAZIDE 25 MG PO TABS: 25.0000 mg | ORAL_TABLET | Freq: Every day | ORAL | 3 refills | Status: DC

## 2021-08-19 MED ORDER — EZETIMIBE 10 MG PO TABS: 10.0000 mg | ORAL_TABLET | Freq: Every day | ORAL | 3 refills | Status: DC

## 2021-08-19 MED ORDER — AMLODIPINE BESYLATE 10 MG PO TABS: 10.0000 mg | ORAL_TABLET | Freq: Every day | ORAL | 3 refills | Status: DC

## 2021-08-19 MED ORDER — ASPIRIN EC 81 MG PO TBEC: 81.0000 mg | DELAYED_RELEASE_TABLET | Freq: Every day | ORAL | 0 refills | Status: AC

## 2021-08-19 MED ORDER — ISOSORBIDE MONONITRATE ER 30 MG PO TB24: ORAL_TABLET | ORAL | 3 refills | Status: DC

## 2021-08-19 MED ORDER — ATORVASTATIN CALCIUM 80 MG PO TABS: 80.0000 mg | ORAL_TABLET | Freq: Every day | ORAL | 3 refills | Status: DC

## 2021-08-19 NOTE — Progress Notes (Signed)
Cardiology Office Note:    Date:  08/19/2021   ID:  [name], DOB [birth year], MRN [id number]  PCP:  Ronnald Ramp, MD   [facility] Providers Cardiologist:  Tobias Alexander, MD (Inactive)     Referring MD: Brett Albino*   Chief Complaint  Patient presents with   Coronary Artery Disease    History of Present Illness:    Terri Estrada is a 70 y.o. female with a hx of coronary artery disease, hypercholesterolemia, pretension, borderline glucose elevation, moderate obesity, presenting for her first evaluation with me.  She is a former patient of Dr. Delton See.  She presented with acute inferior wall myocardial infarction 2001 and received a stent to the mid-distal right coronary artery.  She has not had any coronary events since.  She has preserved left ventricular systolic function (EF 60 to 65% by echocardiogram performed in September 2020).  She has undergone bilateral total knee replacement sequentially in the last 12 months and is now much more active.  She goes walking at [employer] with her sister, 5 days a week, walking about 50 minutes each time.  She denies problems with angina or dyspnea with activity and has not had dizziness, palpitations, syncope, lower extremity edema, claudication, focal neurological events or other cardiovascular complaints.  At her last appointment with Herma Carson ezetimibe was added to her maximum dose atorvastatin with good results.  On labs performed in July of this year her LDL was 64, HDL 59.  Borderline A1c at 5.7% normal triglycerides.  She has normal renal function.  She lives by her self and takes care of all her affairs independently.  She fiercely wants to maintain her autonomy and stay healthy and fit.  Past Medical History:  Diagnosis Date   Arthritis    CAD in native artery    a. MI 2001 s/p PTCA/stenting to mid-distal junction of RCA - residual dx treated medically.   Depression    pt  reports having it intermittently   Diverticulosis of colon (without mention of hemorrhage)    Essential hypertension, benign    MI (myocardial infarction) (HCC)    Mild pulmonary hypertension (HCC)    Obesity, unspecified    Onychia and paronychia of toe    Osteoarthrosis, unspecified whether generalized or localized, unspecified site    Other and unspecified hyperlipidemia    PONV (postoperative nausea and vomiting)    Sickle-cell trait (HCC)    Sinus bradycardia    a. HR 40s even on low dose metoprolol - BB stopped with resolution.   Streptococcal sore throat    Type II or unspecified type diabetes mellitus without mention of complication, not stated as uncontrolled     Past Surgical History:  Procedure Laterality Date   ANGIOPLASTY  09/21/00   CARDIAC CATHETERIZATION     JOINT REPLACEMENT     TOTAL KNEE ARTHROPLASTY Right 02/23/2020   Procedure: RIGHT TOTAL KNEE ARTHROPLASTY;  Surgeon: Tarry Kos, MD;  Location: [facility] OR;  Service: Orthopedics;  Laterality: Right;   TOTAL KNEE ARTHROPLASTY Left 12/27/2020   Procedure: LEFT TOTAL KNEE ARTHROPLASTY;  Surgeon: Tarry Kos, MD;  Location: [facility] OR;  Service: Orthopedics;  Laterality: Left;    Current Medications: Current Meds  Medication Sig   Accu-Chek Softclix Lancets lancets Use as instructed   acetaminophen (TYLENOL) 650 MG CR tablet Take 1,300 mg by mouth in the morning and at bedtime.   amLODipine (NORVASC) 10 MG tablet Take 1 tablet (10 mg total)  by mouth daily.   aspirin EC 81 MG tablet Take 1 tablet (81 mg total) by mouth 2 (two) times daily. To be taken after surgery (Patient taking differently: Take 81 mg by mouth once. To be taken after surgery)   atorvastatin (LIPITOR) 80 MG tablet TAKE ONE TABLET BY MOUTH DAILY   calcium carbonate (OSCAL) 1500 (600 Ca) MG TABS tablet Take 600 mg by mouth 2 (two) times daily.   cetirizine (ZYRTEC) 10 MG tablet TAKE ONE TABLET BY MOUTH DAILY   Cholecalciferol 25 MCG (1000 UT) tablet Take  1,000 Units by mouth daily.   Cinnamon 500 MG TABS Take 500 mg by mouth daily.   Coenzyme Q10 (COQ10) 100 MG CAPS Take 100 mg by mouth in the morning and at bedtime.   ezetimibe (ZETIA) 10 MG tablet TAKE ONE TABLET BY MOUTH DAILY   glucose blood test strip Use as instructed   hydrochlorothiazide (HYDRODIURIL) 25 MG tablet Take 1 tablet (25 mg total) by mouth daily.   isosorbide mononitrate (IMDUR) 30 MG 24 hr tablet TAKE ONE TABLET BY MOUTH DAILY   losartan (COZAAR) 25 MG tablet TAKE ONE TABLET BY MOUTH DAILY--PLEASE MAKE APPOINTMENT   Multiple Vitamins-Minerals (MULTIVITAMINS THER. W/MINERALS) TABS Take 1 tablet by mouth every evening.   Probiotic Product (PROBIOTIC DAILY) CAPS Take 1 capsule by mouth every evening.   traMADol (ULTRAM) 50 MG tablet Take 1 tablet (50 mg total) by mouth 2 (two) times daily as needed.     Allergies:   Ace inhibitors, Shellfish allergy, and Zocor [simvastatin]   Social History   Socioeconomic History   Marital status: Married    Spouse name: Not on file   Number of children: 2   Years of education: Not on file   Highest education level: Not on file  Occupational History   Occupation: DAY CARE    Employer: SELF-EMPLOYED  Tobacco Use   Smoking status: Former   Smokeless tobacco: Never   Tobacco comments:    quit nov 2001  Vaping Use   Vaping Use: Never used  Substance and Sexual Activity   Alcohol use: No   Drug use: No   Sexual activity: Yes    Birth control/protection: Post-menopausal  Other Topics Concern   Not on file  Social History Narrative   Not on file   Social Determinants of Health   Financial Resource Strain: Not on file  Food Insecurity: Not on file  Transportation Needs: Not on file  Physical Activity: Not on file  Stress: Not on file  Social Connections: Not on file     Family History: The patient's family history includes Breast cancer in her sister; Cancer in an other family member; Diabetes in an other family member;  Diabetes type II in her mother and sister; Hypertension in her mother; Sickle cell anemia in her daughter; Stroke in an other family member.  ROS:   Please see the history of present illness.     All other systems reviewed and are negative.  EKGs/Labs/Other Studies Reviewed:    The following studies were reviewed today: Echocardiogram 2020   1. Left ventricular ejection fraction, by visual estimation, is 60 to  65%. The left ventricle has normal function. Normal left ventricular size.  There is mildly increased left ventricular hypertrophy.   2. Left ventricular diastolic Doppler parameters are consistent with  impaired relaxation pattern of LV diastolic filling.   3. Global longitudinal strain= -23%.   4. Global right ventricle has normal systolic function.The  right  ventricular size is normal. No increase in right ventricular wall  thickness.   EKG:  EKG is  ordered today.  The ekg ordered today demonstrates normal sinus rhythm, possible left atrial enlargement, Q waves old inferior infarction, no acute ischemic changes, borderline QTC 455 ms.  No change from previous tracings.  Recent Labs: 12/23/2020: ALT 25 12/28/2020: Hemoglobin 12.1; Platelets 286 04/29/2021: BUN 12; Creatinine, Ser 0.80; Potassium 4.2; Sodium 137  Recent Lipid Panel    Component Value Date/Time   CHOL 137 04/29/2021 1120   TRIG 70 04/29/2021 1120   HDL 59 04/29/2021 1120   CHOLHDL 2.3 04/29/2021 1120   CHOLHDL 2.4 03/26/2015 1033   VLDL 14 03/26/2015 1033   LDLCALC 64 04/29/2021 1120   LDLDIRECT 63 03/15/2012 1520     Risk Assessment/Calculations:           Physical Exam:    VS:  BP 130/70 (BP Location: Left Arm, Patient Position: Sitting, Cuff Size: Large)   Pulse (!) 54   Ht 5\' 2"  (1.575 m)   Wt 186 lb (84.4 kg)   SpO2 95%   BMI 34.02 kg/m     Wt Readings from Last 3 Encounters:  08/19/21 186 lb (84.4 kg)  04/29/21 188 lb 9.6 oz (85.5 kg)  01/11/21 193 lb (87.5 kg)     GEN:  Moderately obese, well nourished, well developed in no acute distress HEENT: Normal NECK: No JVD; No carotid bruits LYMPHATICS: No lymphadenopathy CARDIAC: RRR, no murmurs, rubs, gallops RESPIRATORY:  Clear to auscultation without rales, wheezing or rhonchi  ABDOMEN: Soft, non-tender, non-distended MUSCULOSKELETAL:  No edema; No deformity  SKIN: Warm and dry NEUROLOGIC:  Alert and oriented x 3 PSYCHIATRIC:  Normal affect   ASSESSMENT:    1. Hyperlipidemia, unspecified hyperlipidemia type   2. HYPERTENSION, BENIGN SYSTEMIC    PLAN:    In order of problems listed above:  CAD: Asymptomatic despite an active lifestyle and regular exercise.  I am not entirely sure that she needs the isosorbide mononitrate since she has not used nitroglycerin in years.  Since this is her first appointment together I chose not to stop this medicine, but that is an option for the future.  Continue low-dose aspirin and lipid-lowering therapy.  Not a beta-blocker candidate due to resting bradycardia. Hypercholesterolemia: After adding ezetimibe, all her lipid parameters are in target range. Obesity: Her hemoglobin A1c is borderline elevated at 5.7%, but this is an improvement from 6.1% last year.  Congratulated her on her regular exercise regimen.  Advised weight loss and restricting carbohydrates, especially sugars sweets and starches with high glycemic index. HTN: Well-controlled. Sinus bradycardia: Asymptomatic.  Avoid medications with negative chronotropic effect.  May need a pacemaker in the future if she develops symptoms.           Medication Adjustments/Labs and Tests Ordered: Current medicines are reviewed at length with the patient today.  Concerns regarding medicines are outlined above.  No orders of the defined types were placed in this encounter.  No orders of the defined types were placed in this encounter. Patient Instructions  Medication Instructions:  No changes *If you need a refill on  your cardiac medications before your next appointment, please call your pharmacy*   Lab Work: None ordered If you have labs (blood work) drawn today and your tests are completely normal, you will receive your results only by: MyChart Message (if you have MyChart) OR A paper copy in the mail If you have any  lab test that is abnormal or we need to change your treatment, we will call you to review the results.   Testing/Procedures: None ordered   Follow-Up: At [facility], you and your health needs are our priority.  As part of our continuing mission to provide you with exceptional heart care, we have created designated Provider Care Teams.  These Care Teams include your primary Cardiologist (physician) and Advanced Practice Providers (APPs -  Physician Assistants and Nurse Practitioners) who all work together to provide you with the care you need, when you need it.  We recommend signing up for the patient portal called "MyChart".  Sign up information is provided on this After Visit Summary.  MyChart is used to connect with patients for Virtual Visits (Telemedicine).  Patients are able to view lab/test results, encounter notes, upcoming appointments, etc.  Non-urgent messages can be sent to your provider as well.   To learn more about what you can do with MyChart, go to [email or web address].    Your next appointment:   12 month(s)  The format for your next appointment:   In Person  Provider:   Thurmon Fair, MD

## 2021-08-19 NOTE — Patient Instructions (Signed)

## 2021-08-25 ENCOUNTER — Ambulatory Visit (INDEPENDENT_AMBULATORY_CARE_PROVIDER_SITE_OTHER): Payer: Medicare Other | Admitting: Podiatry

## 2021-08-25 ENCOUNTER — Other Ambulatory Visit: Payer: Self-pay

## 2021-08-25 DIAGNOSIS — Z794 Long term (current) use of insulin: Secondary | ICD-10-CM

## 2021-08-25 DIAGNOSIS — E119 Type 2 diabetes mellitus without complications: Secondary | ICD-10-CM | POA: Diagnosis not present

## 2021-08-25 NOTE — Patient Instructions (Signed)

## 2021-08-28 NOTE — Progress Notes (Signed)
Subjective: 70 year old female presents the office today for yearly diabetic exam.  States that she been doing well she denies any open sores.  She did try different pair shoes which aggravated her foot she stopped wearing them and the pain resolved.  She denies any new issues or any injury.  Last glucose was 110. Her last A1c that she reports was 5.7.  Terri Estrada, Terri Cooler, MD Last seen April 29, 2021  Objective: AAO x3, NAD DP/PT pulses palpable bilaterally, CRT less than 3 seconds Sensation intact with Semmes Weinstein monofilament. Bunions are present bilaterally.  Adductovarus present of the lesser digits most notably on the right side the third, fourth, fifth toes.  There is no area tenderness there is no palpable today.  There is no pain with the toe today there is no numbness or tingling.  No open lesions or pre-ulcerative lesions.  MMT 5/5. No pain with calf compression, swelling, warmth, erythema  Assessment: Controlled type 2 diabetes  Plan: -All treatment options discussed with the patient including all alternatives, risks, complications.  -Overall she is doing well.  Continue with daily foot inspections while she is in proper arch support.  Continue good glucose control.   Return in about 1 year   Vivi Barrack DPM

## 2021-08-30 NOTE — Progress Notes (Signed)
    SUBJECTIVE:   CHIEF COMPLAINT / HPI: physical   Annual Examination Female over 69 yo  I reviewed the following patient responses on our Physical Exam Form Tobacco use and candidacy for lung cancer screening Alcohol Use  Weight, 185 and 186 from previous  Exercise, walks 5 days per week  Risk for STI, denies sexual activity  Fall risk, no longer after knee surgery  Advanced Directive Increased family cancer risk Violence risk, denies   PHQ9 score reviewed Blood pressure reviewed  I considered the following items based upon USPSTF recommendations: Cholesterol screening STI screening if high risk (Hepatitis B, Syphilis, Gonorrhea, Chlamydia) Immunizations - Influenza (07/2021; at [facility]) , Covid  HTN: within goal range. Reports regular adherence. No symptoms of hypotension.   DM: diet controlled. Walks and works out at gym regularly. Would like to have A1c checked today.    Depression: related to daughter's death, reports depressed mood Sept-Nov every year, would like to speak with therapist  during this season   PERTINENT  PMH / PSH:  HLD  HTN  T2DM  OA of bilateral knees, s/p replacement   OBJECTIVE:   BP 124/82   Pulse 84   Ht 5\' 2"  (1.575 m)   Wt 185 lb 9.6 oz (84.2 kg)   SpO2 96%   BMI 33.95 kg/m   Physical Exam Vitals reviewed.  Constitutional:      General: She is not in acute distress.    Appearance: Normal appearance. She is not ill-appearing, toxic-appearing or diaphoretic.  Eyes:     Conjunctiva/sclera: Conjunctivae normal.  Cardiovascular:     Rate and Rhythm: Normal rate and regular rhythm.     Pulses: Normal pulses.     Heart sounds: Murmur heard.  Pulmonary:     Effort: Pulmonary effort is normal.     Breath sounds: Normal breath sounds. No wheezing, rhonchi or rales.  Abdominal:     General: Bowel sounds are normal. There is no distension.     Palpations: Abdomen is soft.     Tenderness: There is no abdominal tenderness.   Musculoskeletal:     Right lower leg: No edema.     Left lower leg: No edema.  Neurological:     Mental Status: She is alert and oriented to person, place, and time.  Psychiatric:     Comments: Mildly depressed mood, denies SI/HI     ASSESSMENT/PLAN:   Diabetes mellitus type 2, controlled, without complications Checked Hgb A1c  Discussed recommendation to follow up in 6 months if continues to be well controlled, patient was agreeable with this plan   Depression Reports increased feelings of sadness in relation to anniversary of her daughter's death  Patient given resources for counseling per request  Plan to follow up for mood check in 2 weeks   HYPERTENSION, BENIGN SYSTEMIC Continue amlodipine, coreg,imdur and Hctz BP well controlled today 124/82  Healthcare maintenance Will plan to have COVID booster in Jan 2023, most recent vaccine booster in July 2022 recommended 6 month wait, patient agreeable with this plan  Reports she received influenza vaccine in 07/2021      08/2021, MD [facility]

## 2021-08-31 ENCOUNTER — Ambulatory Visit (INDEPENDENT_AMBULATORY_CARE_PROVIDER_SITE_OTHER): Payer: Medicare Other | Admitting: Family Medicine

## 2021-08-31 ENCOUNTER — Encounter: Payer: Self-pay | Admitting: Family Medicine

## 2021-08-31 ENCOUNTER — Other Ambulatory Visit: Payer: Self-pay

## 2021-08-31 VITALS — BP 124/82 | HR 84 | Ht 62.0 in | Wt 185.6 lb

## 2021-08-31 DIAGNOSIS — E119 Type 2 diabetes mellitus without complications: Secondary | ICD-10-CM

## 2021-08-31 DIAGNOSIS — F3289 Other specified depressive episodes: Secondary | ICD-10-CM

## 2021-08-31 DIAGNOSIS — Z0000 Encounter for general adult medical examination without abnormal findings: Secondary | ICD-10-CM

## 2021-08-31 DIAGNOSIS — I10 Essential (primary) hypertension: Secondary | ICD-10-CM

## 2021-08-31 LAB — POCT GLYCOSYLATED HEMOGLOBIN (HGB A1C): HbA1c, POC (controlled diabetic range): 5.9 % (ref 0.0–7.0)

## 2021-08-31 NOTE — Assessment & Plan Note (Signed)
Reports increased feelings of sadness in relation to anniversary of her daughter's death  Patient given resources for counseling per request  Plan to follow up for mood check in 2 weeks

## 2021-08-31 NOTE — Assessment & Plan Note (Addendum)
Will plan to have COVID booster in Jan 2023, most recent vaccine booster in July 2022 recommended 6 month wait, patient agreeable with this plan  Reports she received influenza vaccine in 07/2021

## 2021-08-31 NOTE — Assessment & Plan Note (Signed)
Continue amlodipine, coreg,imdur and Hctz BP well controlled today 124/82

## 2021-08-31 NOTE — Patient Instructions (Addendum)
  You are up to date with your healthcare maintenance items. I recommend returning in Jan for a nurse visit to get your next COVID booster since your last vaccine was in July.   We will update your influenza vaccine for this season based on our discussion today.   Please plan to follow up your hemoglobin A1c check in 6 months. I will let you know if your numbers have changed after today's A1c check.   Please follow up in 2 weeks to check on your mood.   Psychiatry Resource List (Adults and Children) Most of these providers will take Medicaid. please consult your insurance for a complete and updated list of available providers. When calling to make an appointment have your insurance information available to confirm you are covered.   [facility]:Psychiatry and Counseling [address] [place], [place] [address] [phone]  [facility]  [address] [place], [place] [phone] Crisis [phone]   [facility]:   [facility]: [address]     [phone]   [facility]: [address] [place]. [place],        [phone] [place]: [address] [phone],    [phone] [place]: [phone] [address],                   [phone] Children: [facility] [address]         [phone]  [facility] (virtual only) [phone]   [facility]  (Psychiatry only; Adults /children 12 and over, will take Medicaid)  [address] Laurell Josephs [address], [place], [place] [address]       [phone]   [facility] (Psychiatry & counseling ; adults & children ; will take Medicaid [address]  [place] [address]  Go on-line to complete referral ( [email or web address]://[email or web address]/en/[email or web address] [phone]    (Spanish speaking therapists)  [facility]  Psychiatry & counseling; Adults and children;  Call  Registration prior to scheduling an appointment [phone] [address]    [place], [place] [address]    [phone]  [facility] (Psychiatry & counseling; adults & children; Medicare no Medicaid)  [address]   [place], [place]  [address]      [phone]    [facility] (up to age 26)  Psychiatry & counseling ,will take Medicaid, must do counseling to receive psychiatry services  [address] [place] [address]        [phone]  [facility] (Psychiatry & counseling; adults & children; will take Medicaid) Will need a referral from provider [address],  [place], [place]  [phone]   [facility] --- Walk-In Mon-Friday 8am-3pm ( will take Medicaid, Psychiatry, Adults & children,  [address] [place], [place]   [phone]   [facility], Walk-in M-F 8am-12pm and 1pm -3pm   (Counseling, Psychiatry, will take Medicaid, adults & children)  [address], [place], [place]  [phone]

## 2021-08-31 NOTE — Assessment & Plan Note (Signed)
Checked Hgb A1c  Discussed recommendation to follow up in 6 months if continues to be well controlled, patient was agreeable with this plan

## 2021-09-13 ENCOUNTER — Other Ambulatory Visit: Payer: Self-pay

## 2021-09-13 ENCOUNTER — Ambulatory Visit (INDEPENDENT_AMBULATORY_CARE_PROVIDER_SITE_OTHER): Payer: Medicare Other | Admitting: Orthopaedic Surgery

## 2021-09-13 DIAGNOSIS — Z96651 Presence of right artificial knee joint: Secondary | ICD-10-CM | POA: Diagnosis not present

## 2021-09-13 DIAGNOSIS — Z96652 Presence of left artificial knee joint: Secondary | ICD-10-CM

## 2021-09-13 NOTE — Progress Notes (Signed)
Office Visit Note   Patient: Terri Estrada           Date of Birth: Apr 06, 1951           MRN: [id number] Visit Date: 09/13/2021              Requested by: Ronnald Ramp, MD [address] [place],  [place] [address] PCP: Ronnald Ramp, MD   Assessment & Plan: Visit Diagnoses:  1. Status post total right knee replacement   2. Status post total left knee replacement     Plan: Terri Estrada is 34-month status post left total knee replacement.  She is doing well overall has no real complaints today.  The right knee has done well from knee replacement as well.  Left knee shows a fully healed surgical scar.  Excellent range of motion.  Stable to varus valgus.  No swelling.  Normal gait and ambulation.  [name] is done very well from her surgery.  Dental prophylaxis reinforced.  Recheck in 6 months with two-view x-rays of both knees.  Follow-Up Instructions: Return in about 6 months (around 03/14/2022).   Orders:  No orders of the defined types were placed in this encounter.  No orders of the defined types were placed in this encounter.     Procedures: No procedures performed   Clinical Data: No additional findings.   Subjective: Chief Complaint  Patient presents with   Left Knee - Follow-up    S/p left TKA 12/27/20    HPI  Review of Systems   Objective: Vital Signs: There were no vitals taken for this visit.  Physical Exam  Ortho Exam  Specialty Comments:  No specialty comments available.  Imaging: No results found.   PMFS History: Patient Active Problem List   Diagnosis Date Noted   Status post total left knee replacement 12/27/2020   Primary osteoarthritis of left knee 12/26/2020   Fatigue 06/18/2020   Status post total right knee replacement 02/23/2020   Acute seasonal allergic rhinitis 12/19/2019   Pre-op evaluation 10/23/2019   Murmur 12/10/2015   Pes planus of both feet 01/07/2015   Depression 01/07/2015   Female  cystocele 06/26/2014   Healthcare maintenance 12/17/2012   Osteoarthritis 06/21/2010   Hyperlipidemia 05/31/2009   DIVERTICULOSIS, COLON 10/16/2008   AORTIC STENOSIS, MILD 06/02/2008   Diabetes mellitus type 2, controlled, without complications (HCC) 12/06/2006   Obesity (BMI 30-39.9) 12/06/2006   SICKLE CELL TRAIT 12/06/2006   HYPERTENSION, BENIGN SYSTEMIC 12/06/2006   CORONARY, ARTERIOSCLEROSIS 12/06/2006   Past Medical History:  Diagnosis Date   Arthritis    CAD in native artery    a. MI 2001 s/p PTCA/stenting to mid-distal junction of RCA - residual dx treated medically.   Depression    pt reports having it intermittently   Diverticulosis of colon (without mention of hemorrhage)    Essential hypertension, benign    MI (myocardial infarction) (HCC)    Mild pulmonary hypertension (HCC)    Obesity, unspecified    Onychia and paronychia of toe    Osteoarthrosis, unspecified whether generalized or localized, unspecified site    Other and unspecified hyperlipidemia    PONV (postoperative nausea and vomiting)    Sickle-cell trait (HCC)    Sinus bradycardia    a. HR 40s even on low dose metoprolol - BB stopped with resolution.   Streptococcal sore throat    Type II or unspecified type diabetes mellitus without mention of complication, not stated as uncontrolled     Family  History  Problem Relation Age of Onset   Sickle cell anemia Daughter    Hypertension Mother    Diabetes type II Mother    Diabetes type II Sister    Breast cancer Sister    Cancer Other    Stroke Other    Diabetes Other     Past Surgical History:  Procedure Laterality Date   ANGIOPLASTY  09/21/00   CARDIAC CATHETERIZATION     JOINT REPLACEMENT     TOTAL KNEE ARTHROPLASTY Right 02/23/2020   Procedure: RIGHT TOTAL KNEE ARTHROPLASTY;  Surgeon: Tarry Kos, MD;  [facility]: [facility];  Service: Orthopedics;  Laterality: Right;   TOTAL KNEE ARTHROPLASTY Left 12/27/2020   Procedure: LEFT TOTAL KNEE ARTHROPLASTY;   Surgeon: Tarry Kos, MD;  Location: [facility] OR;  Service: Orthopedics;  Laterality: Left;   Social History   Occupational History   Occupation: DAY Associate Professor: SELF-EMPLOYED  Tobacco Use   Smoking status: Former   Smokeless tobacco: Never   Tobacco comments:    quit nov 2001  Vaping Use   Vaping Use: Never used  Substance and Sexual Activity   Alcohol use: No   Drug use: No   Sexual activity: Yes    Birth control/protection: Post-menopausal

## 2021-09-14 ENCOUNTER — Other Ambulatory Visit: Payer: Self-pay

## 2021-09-14 ENCOUNTER — Ambulatory Visit (INDEPENDENT_AMBULATORY_CARE_PROVIDER_SITE_OTHER): Payer: Medicare Other | Admitting: Family Medicine

## 2021-09-14 ENCOUNTER — Encounter: Payer: Self-pay | Admitting: Family Medicine

## 2021-09-14 VITALS — BP 139/73 | HR 84 | Ht 62.0 in | Wt 186.8 lb

## 2021-09-14 DIAGNOSIS — Z634 Disappearance and death of family member: Secondary | ICD-10-CM | POA: Diagnosis not present

## 2021-09-14 DIAGNOSIS — F4321 Adjustment disorder with depressed mood: Secondary | ICD-10-CM | POA: Diagnosis not present

## 2021-09-14 NOTE — Patient Instructions (Signed)
I will submit a referral to our chronic care management to help with finding a good support group for you. Please be on the lookout for a call from her to discuss options.

## 2021-09-15 ENCOUNTER — Ambulatory Visit: Payer: Medicare Other | Admitting: Family Medicine

## 2021-09-15 DIAGNOSIS — Z634 Disappearance and death of family member: Secondary | ICD-10-CM | POA: Insufficient documentation

## 2021-09-15 DIAGNOSIS — F4321 Adjustment disorder with depressed mood: Secondary | ICD-10-CM | POA: Insufficient documentation

## 2021-09-15 NOTE — Progress Notes (Addendum)
    SUBJECTIVE:   CHIEF COMPLAINT / HPI: Terri Estrada   Grief Patient reports she is still having moments of sadness when she thinks about memories with her daughter around this time of year. She denies thoughts of SI/HI. She denies A/V hallucinations. She states that she would like to work in a group setting for grief support and adds that she worked with a group called [facility] around the time that her daughter passed away. She prefers the group setting over speaking with a therapist one-on-one. She continues to decline offer for medication, stating that the moments of sadness are intermittent and do no interrupt her ability to function throughout the day or sleep at night. She also notes finding comfort in speaking with family members who have suffered various losses.   PERTINENT  PMH / PSH:  Grief due to loss of child  OBJECTIVE:   BP 139/73   Pulse 84   Ht 5\' 2"  (1.575 m)   Wt 186 lb 12.8 oz (84.7 kg)   SpO2 97%   BMI 34.17 kg/m   General: female alert, making appropriate eye contact, well groomed, calm appearing Motor activity: no psychomotor agitation or slowing Speech: normal rate, volume and fluency  Mood: patient states mood is "better"  Thought Content: coherent, no delusions, no A/V hallucincations, demonstrates normal insight & judgement Cognition: oriented to time, place and person   ASSESSMENT/PLAN:   Grief at loss of child Will place CCM referral to connect patient with grief support group given patient's daughter passed over 1 year ago.  Patient previously worked with hospice group and completed the designed course, would like to continue in new group setting Patient declines antidepressant medication  Patient previously given list of potential providers and declines as she prefers to work in group vs one-on-one     , MD [facility]

## 2021-09-15 NOTE — Assessment & Plan Note (Signed)
Will place CCM referral to connect patient with grief support group given patient's daughter passed over 1 year ago.  Patient previously worked with hospice group and completed the designed course, would like to continue in new group setting Patient declines antidepressant medication  Patient previously given list of potential providers and declines as she prefers to work in group vs one-on-one

## 2021-09-16 ENCOUNTER — Telehealth: Payer: Self-pay | Admitting: *Deleted

## 2021-09-16 NOTE — Chronic Care Management (AMB) (Signed)
  Care Management   Note  09/16/2021 Name: [name] MRN: [id number] DOB: July 13, 1951  Terri Handy Dry is a 70 y.o. year old female who is a primary care patient of Simmons-Robinson, Tawanna Cooler, MD. I reached out to [name] by phone today in response to a referral sent by [name]'s primary care provider.   [name] was given information about care management services today including:  Care management services include personalized support from designated clinical staff supervised by her physician, including individualized plan of care and coordination with other care providers 24/7 contact phone numbers for assistance for urgent and routine care needs. The patient may stop care management services at any time by phone call to the office staff.  Patient agreed to services and verbal consent obtained.   Follow up plan: Telephone appointment with care management team member scheduled for:09/21/21  [facility] Guide, Embedded Care Coordination [facility]  Care Management  Direct Dial: [phone]

## 2021-09-21 ENCOUNTER — Ambulatory Visit: Payer: Medicare Other | Admitting: Licensed Clinical Social Worker

## 2021-09-21 DIAGNOSIS — I10 Essential (primary) hypertension: Secondary | ICD-10-CM

## 2021-09-21 DIAGNOSIS — F4321 Adjustment disorder with depressed mood: Secondary | ICD-10-CM

## 2021-09-21 DIAGNOSIS — Z7689 Persons encountering health services in other specified circumstances: Secondary | ICD-10-CM

## 2021-09-21 DIAGNOSIS — F3289 Other specified depressive episodes: Secondary | ICD-10-CM

## 2021-09-21 NOTE — Chronic Care Management (AMB) (Addendum)
Care Management  Clinical Social Work Note  09/21/2021 Name: Terri Estrada MRN: [id number] DOB: [birth year]  Terri Estrada is a 70 y.o. year old female who is a primary care patient of Simmons-Robinson, Tawanna Cooler, MD. The CCM team was consulted for assistance with care coordination needs: Grief Counseling   Terri Estrada was given information about Care Management services today including:  Care Management services include personalized support from designated clinical staff supervised by her physician, including individualized plan of care and coordination with other care providers 24/7 contact phone numbers for assistance for urgent and routine care needs. The patient may stop care management services at any time (effective at the end of the month) by phone call to the office staff.  Patient agreed to services and consent obtained.   Engaged with patient by telephone for initial visit in response to provider referral for social care coordination services. Assessed patient's previous and current treatment, coping skills, support system and barriers to care..   Assessment includes: Review of patient past medical history, allergies, medications, and health status, including review of pertinent consultant reports was performed as part of comprehensive evaluation and provision of care management/care coordination services.See Care Plan below for interventions and patient self-care actives.  SDOH (Social Determinants of Health) screening and interventions performed today:  SDOH Interventions    Flowsheet Row Most Recent Value  SDOH Interventions   Stress Interventions Provide Counseling       Advanced Directives Status:Not addressed in this encounter.     Care Plan    Conditions to be addressed/monitored per PCP order: ,  Grief  Care Plan : General Social Work (Adult)  Updates made by Soundra Pilon, LCSW since 09/21/2021 12:00 AM     Problem: Emotional Distress      Goal:  Emotional Health Supported by connecting with support group   Start Date: 09/21/2021  Expected End Date: 10/07/2021  This Visit's Progress: On track  Priority: High  Note:   Current Barriers:  Disease Management support and education needs related to Grief  CSW Clinical Goal(s):  Patient  will work with [facility] to address needs related to grief  through collaboration with Clinical Child psychotherapist, provider, and care team.   Interventions: Inter-disciplinary care team collaboration (see longitudinal plan of care) Evaluation of current treatment plan related to  self management and patient's adherence to plan as established by provider Review resources, discussed options and provided patient information about  Options for mental health treatment based on need   [facility] [employer]   Mental Health:  (Status: New goal. Patient declined further engagement on this goal.) Evaluation of current treatment plan related to Grief Solution-Focused Strategies employed: to connect to resources Active listening / Reflection utilized  Emotional Support Provided Problem Solving /Task Center strategies reviewed Made referral to [facility] and provided contact information   Patient Self-Care Activities: Call [place] [phone] to follow up on support group option  I have placed a referral [facility] [phone] they will contact you.   If you have not heard from them follow up in a few days.    Follow up Plan:  Patient does not desire continued follow-up by CCM LCSW. Will contact the office if needed CCM LCSW will disconnect from patient's care team at this time, but will be available at any time they would like to re-engage for care coordination services.   Sammuel Hines, LCSW Care Management & Coordination  [facility] / [facility]   [phone]     I agree with the above documentation and plan for [employer].    Ronnald Ramp, MD [facility], PGY-3 [phone]

## 2021-09-21 NOTE — Patient Instructions (Addendum)
Visit Information  Thank you for taking time to visit with me today. Please don't hesitate to contact me if I can be of assistance to you before our next scheduled telephone appointment.   Please call the care guide team at [phone] if you need to cancel or reschedule your appointment.   If you are experiencing a Mental Health or Behavioral Health Crisis or need someone to talk to, please call the Suicide and Crisis Lifeline: 988 call the [place] National Suicide Prevention Lifeline: 8547229473 or TTY: (541)247-2839 TTY 9511860628) to talk to a trained counselor call 1-800-273-TALK (toll free, 24 hour hotline) go to [facility] [address], [place] [phone]) call 911   Following is a copy of your full plan of care:  Care Plan : General Social Work (Adult)  Updates made by Soundra Pilon, LCSW since 09/21/2021 12:00 AM     Problem: Emotional Distress      Goal: Emotional Health Supported by connecting with support group   Start Date: 09/21/2021  Expected End Date: 10/07/2021  This Visit's Progress: On track  Priority: High  Note:   Current Barriers:  Disease Management support and education needs related to Grief  CSW Clinical Goal(s):  Patient  will work with [facility] to address needs related to grief  through collaboration with Clinical Child psychotherapist, provider, and care team.   Interventions: Inter-disciplinary care team collaboration (see longitudinal plan of care) Evaluation of current treatment plan related to  self management and patient's adherence to plan as established by provider Review resources, discussed options and provided patient information about  Options for mental health treatment based on need   [facility] [employer]   Mental Health:  (Status: New goal. Patient declined further engagement on this goal.) Evaluation of current treatment plan related to Grief Solution-Focused Strategies  employed: to connect to resources Active listening / Reflection utilized  Emotional Support Provided Problem Solving /Task Center strategies reviewed Made referral to [facility] and provided contact information   Patient Self-Care Activities: Call [place] [phone] to follow up on support group option  I have placed a referral [facility] [phone] they will contact you.   If you have not heard from them follow up in a few days.     Ms. Daza was given information about Care Management services by the embedded care coordination team including:  Care Management services include personalized support from designated clinical staff supervised by her physician, including individualized plan of care and coordination with other care providers 24/7 contact phone numbers for assistance for urgent and routine care needs. The patient may stop CCM services at any time (effective at the end of the month) by phone call to the office staff.  Patient agreed to services and verbal consent obtained.   Patient verbalizes understanding of instructions provided today and agrees to view in MyChart.    No follow up scheduled, per our conversation you do not require or desire continued follow up I will disconnect from your care team at this time, please call the office if additional needs are identified   Sammuel Hines, [facility] Care Management & Coordination  [phone]

## 2021-10-12 ENCOUNTER — Telehealth: Payer: Self-pay | Admitting: Orthopaedic Surgery

## 2021-10-12 ENCOUNTER — Other Ambulatory Visit: Payer: Self-pay | Admitting: Family Medicine

## 2021-10-12 ENCOUNTER — Other Ambulatory Visit: Payer: Self-pay | Admitting: Physician Assistant

## 2021-10-12 DIAGNOSIS — Z1231 Encounter for screening mammogram for malignant neoplasm of breast: Secondary | ICD-10-CM

## 2021-10-12 MED ORDER — TRAMADOL HCL 50 MG PO TABS: 50.0000 mg | ORAL_TABLET | Freq: Two times a day (BID) | ORAL | 2 refills | Status: DC | PRN

## 2021-10-12 NOTE — Telephone Encounter (Signed)
Sent in

## 2021-10-12 NOTE — Telephone Encounter (Signed)
Pt would like refill on tramadol ? ?

## 2021-10-31 ENCOUNTER — Ambulatory Visit: Payer: Medicare Other

## 2021-10-31 ENCOUNTER — Other Ambulatory Visit: Payer: Self-pay

## 2021-11-18 ENCOUNTER — Ambulatory Visit (INDEPENDENT_AMBULATORY_CARE_PROVIDER_SITE_OTHER): Payer: Medicare Other | Admitting: Orthopaedic Surgery

## 2021-11-18 ENCOUNTER — Ambulatory Visit: Payer: Self-pay

## 2021-11-18 ENCOUNTER — Other Ambulatory Visit: Payer: Self-pay

## 2021-11-18 ENCOUNTER — Encounter: Payer: Self-pay | Admitting: Orthopaedic Surgery

## 2021-11-18 DIAGNOSIS — Z96651 Presence of right artificial knee joint: Secondary | ICD-10-CM

## 2021-11-18 DIAGNOSIS — Z96652 Presence of left artificial knee joint: Secondary | ICD-10-CM

## 2021-11-18 DIAGNOSIS — Z96653 Presence of artificial knee joint, bilateral: Secondary | ICD-10-CM

## 2021-11-18 NOTE — Progress Notes (Signed)
Office Visit Note   Patient: Terri Estrada           Date of Birth: 1951-08-16           MRN: [id number] Visit Date: 11/18/2021              Requested by: Ronnald Ramp, MD [address] [place],  [place] [address] PCP: Ronnald Ramp, MD   Assessment & Plan: Visit Diagnoses:  1. History of total knee replacement, bilateral     Plan: Impression is status post bilateral total knee replacement with recent fall.  Clinically and radiologically she is fine.  She will continue to advance with activity as tolerated.  Reassurance was provided.  Follow-up with [place] as needed.  Follow-Up Instructions: Return if symptoms worsen or fail to improve.   Orders:  Orders Placed This Encounter  Procedures   XR KNEE 3 VIEW LEFT   XR KNEE 3 VIEW RIGHT   No orders of the defined types were placed in this encounter.     Procedures: No procedures performed   Clinical Data: No additional findings.   Subjective: Chief Complaint  Patient presents with   Right Knee - Injury    11/16/2021   Left Knee - Injury    11/16/2021    HPI patient is a pleasant 71 year old female who is status post bilateral total knee replacement right total knee replacement 02/2020 and left total knee replacement 12/2020 she comes in today after sustaining a fall this past Wednesday.  On 11/16/2021, she was walking when she sustained a mechanical fall landing on her right hip.  She initially had some discomfort to the right hip which is since resolved.  She never had any pain or swelling to either knee.  She is here primarily to make sure she did not injure her knees.  Review of Systems as detailed in HPI.  All others reviewed and are negative.   Objective: Vital Signs: There were no vitals taken for this visit.  Physical Exam well-developed well-nourished female no acute distress.  Alert and oriented x3.  Ortho Exam right hip exam reveals no tenderness to the lateral hip.  Negative logroll  and negative FADIR.  Bilateral knee exam shows no skin changes.  Range of motion 0 to 120 degrees.  Stable to valgus varus stress.  No joint line tenderness.  She is neurovascular tact distally.  Specialty Comments:  No specialty comments available.  Imaging: XR KNEE 3 VIEW LEFT  Result Date: 11/18/2021 Well-seated prosthesis without complication  XR KNEE 3 VIEW RIGHT  Result Date: 11/18/2021 Well-seated prosthesis without complication    PMFS History: Patient Active Problem List   Diagnosis Date Noted   Grief at loss of child 09/15/2021   Status post total left knee replacement 12/27/2020   Primary osteoarthritis of left knee 12/26/2020   Fatigue 06/18/2020   Status post total right knee replacement 02/23/2020   Acute seasonal allergic rhinitis 12/19/2019   Pre-op evaluation 10/23/2019   Murmur 12/10/2015   Pes planus of both feet 01/07/2015   Depression 01/07/2015   Female cystocele 06/26/2014   Healthcare maintenance 12/17/2012   Osteoarthritis 06/21/2010   Hyperlipidemia 05/31/2009   DIVERTICULOSIS, COLON 10/16/2008   AORTIC STENOSIS, MILD 06/02/2008   Diabetes mellitus type 2, controlled, without complications (HCC) 12/06/2006   Obesity (BMI 30-39.9) 12/06/2006   SICKLE CELL TRAIT 12/06/2006   HYPERTENSION, BENIGN SYSTEMIC 12/06/2006   CORONARY, ARTERIOSCLEROSIS 12/06/2006   Past Medical History:  Diagnosis Date  Arthritis    CAD in native artery    a. MI 2001 s/p PTCA/stenting to mid-distal junction of RCA - residual dx treated medically.   Depression    pt reports having it intermittently   Diverticulosis of colon (without mention of hemorrhage)    Essential hypertension, benign    MI (myocardial infarction) (HCC)    Mild pulmonary hypertension (HCC)    Obesity, unspecified    Onychia and paronychia of toe    Osteoarthrosis, unspecified whether generalized or localized, unspecified site    Other and unspecified hyperlipidemia    PONV (postoperative  nausea and vomiting)    Sickle-cell trait (HCC)    Sinus bradycardia    a. HR 40s even on low dose metoprolol - BB stopped with resolution.   Streptococcal sore throat    Type II or unspecified type diabetes mellitus without mention of complication, not stated as uncontrolled     Family History  Problem Relation Age of Onset   Sickle cell anemia Daughter    Hypertension Mother    Diabetes type II Mother    Diabetes type II Sister    Breast cancer Sister    Cancer Other    Stroke Other    Diabetes Other     Past Surgical History:  Procedure Laterality Date   ANGIOPLASTY  09/21/00   CARDIAC CATHETERIZATION     JOINT REPLACEMENT     TOTAL KNEE ARTHROPLASTY Right 02/23/2020   Procedure: RIGHT TOTAL KNEE ARTHROPLASTY;  Surgeon: Tarry Kos, MD;  Location: [facility] OR;  Service: Orthopedics;  Laterality: Right;   TOTAL KNEE ARTHROPLASTY Left 12/27/2020   Procedure: LEFT TOTAL KNEE ARTHROPLASTY;  Surgeon: Tarry Kos, MD;  Location: [facility] OR;  Service: Orthopedics;  Laterality: Left;   Social History   Occupational History   Occupation: DAY Associate Professor: SELF-EMPLOYED  Tobacco Use   Smoking status: Former   Smokeless tobacco: Never   Tobacco comments:    quit nov 2001  Vaping Use   Vaping Use: Never used  Substance and Sexual Activity   Alcohol use: No   Drug use: No   Sexual activity: Yes    Birth control/protection: Post-menopausal

## 2021-11-21 ENCOUNTER — Ambulatory Visit
Admission: RE | Admit: 2021-11-21 | Discharge: 2021-11-21 | Disposition: A | Discharge status: Home / Self Care | Payer: Medicare Other | Source: Ambulatory Visit | Attending: *Deleted | Admitting: *Deleted

## 2021-11-21 DIAGNOSIS — Z1231 Encounter for screening mammogram for malignant neoplasm of breast: Secondary | ICD-10-CM | POA: Diagnosis not present

## 2021-11-22 ENCOUNTER — Other Ambulatory Visit: Payer: Self-pay

## 2021-11-22 ENCOUNTER — Ambulatory Visit (INDEPENDENT_AMBULATORY_CARE_PROVIDER_SITE_OTHER): Payer: Medicare Other

## 2021-11-22 DIAGNOSIS — Z23 Encounter for immunization: Secondary | ICD-10-CM

## 2021-12-15 ENCOUNTER — Ambulatory Visit (INDEPENDENT_AMBULATORY_CARE_PROVIDER_SITE_OTHER): Payer: Medicare Other

## 2021-12-15 ENCOUNTER — Other Ambulatory Visit: Payer: Self-pay

## 2021-12-15 ENCOUNTER — Ambulatory Visit (INDEPENDENT_AMBULATORY_CARE_PROVIDER_SITE_OTHER): Payer: Medicare Other | Admitting: Podiatry

## 2021-12-15 DIAGNOSIS — M2141 Flat foot [pes planus] (acquired), right foot: Secondary | ICD-10-CM

## 2021-12-15 DIAGNOSIS — M2142 Flat foot [pes planus] (acquired), left foot: Secondary | ICD-10-CM

## 2021-12-15 DIAGNOSIS — M2042 Other hammer toe(s) (acquired), left foot: Secondary | ICD-10-CM

## 2021-12-15 DIAGNOSIS — M2041 Other hammer toe(s) (acquired), right foot: Secondary | ICD-10-CM

## 2021-12-15 DIAGNOSIS — M21619 Bunion of unspecified foot: Secondary | ICD-10-CM | POA: Diagnosis not present

## 2021-12-15 DIAGNOSIS — M79671 Pain in right foot: Secondary | ICD-10-CM

## 2021-12-18 NOTE — Progress Notes (Signed)
Subjective: ?71 year old female presents the office today for concerns of both of her second toes started become contracted and pinned.  She denies any recent injuries and denies any open lesions.  She said that she is no longer taking medication for diabetes her last A1c was 5.8 and last glucose was 109.  She states that she does a lot of walking every day she is not sure if this aggravates her symptoms.  She is not any recent treatment.  No other concerns. ? ?Objective: ?AAO x3, NAD ?DP/PT pulses palpable bilaterally, CRT less than 3 seconds ?Bunions present bilaterally with hammertoe contracture noted at the second digit.  There is no specific area pinpoint tenderness.  Decreased medial arch upon weightbearing.  MMT 5/5.   ?No pain with calf compression, swelling, warmth, erythema ? ?Assessment: ?Hammertoe contracture, bunions ? ?Plan: ?-All treatment options discussed with the patient including all alternatives, risks, complications.  ?-X-rays obtained reviewed.  Splayfoot is noted.  Flatfoot is evident with hammertoe deformity. ?-Ultimately discussed shoe modifications and different splints and offloading to help with the hammertoe.  Discussed this is progressive but hopefully we can help slow the progression.  Needs to monitor closely for any skin breakdown. ?-Patient encouraged to call the office with any questions, concerns, change in symptoms.  ? ?Vivi Barrack DPM ? ?

## 2022-01-07 ENCOUNTER — Other Ambulatory Visit: Payer: Self-pay | Admitting: Podiatry

## 2022-01-07 DIAGNOSIS — M2142 Flat foot [pes planus] (acquired), left foot: Secondary | ICD-10-CM

## 2022-01-24 ENCOUNTER — Telehealth: Payer: Self-pay | Admitting: *Deleted

## 2022-01-24 NOTE — Chronic Care Management (AMB) (Signed)
?  Care Management  ? ?Note ? ?01/24/2022 ?Name: [name] MRN: [id number] DOB: [birth year] ? ?Terri Estrada is a 71 y.o. year old female who is a primary care patient of Simmons-Robinson, Tawanna Cooler, MD. I reached out to [name] by phone today offer care coordination services.  ? ?[name] was given information about care management services today including:  ?Care management services include personalized support from designated clinical staff supervised by her physician, including individualized plan of care and coordination with other care providers ?24/7 contact phone numbers for assistance for urgent and routine care needs. ?The patient may stop care management services at any time by phone call to the office staff. ? ?Patient agreed to services and verbal consent obtained.  ? ?Follow up plan: ?Telephone appointment with care management team member scheduled for:01/26/22 ? ?Gwenevere Ghazi  ?Care Guide, Embedded Care Coordination ?[place]  Care Management  ?Direct Dial: [phone] ? ?

## 2022-01-24 NOTE — Chronic Care Management (AMB) (Signed)
?  Care Management  ? ?Outreach Note ? ?01/24/2022 ?Name: Terri Estrada MRN: [id number] DOB: [birth year] ? ?Referred by: Ronnald Ramp, MD ?Reason for referral : Care Coordination (Outreach to schedule initial call with [facility] ) ? ? ?An unsuccessful telephone outreach was attempted today. The patient was referred to the case management team for assistance with care management and care coordination.  ? ?Follow Up Plan:  ?A HIPAA compliant phone message was left for the patient providing contact information and requesting a return call.  ?The care management team will reach out to the patient again over the next 7 days.  ?If patient returns call to provider office, please advise to call Embedded Care Management Care Guide Misty Stanley* at [phone].* ? ?Gwenevere Ghazi  ?Care Guide, Embedded Care Coordination ?[place]  Care Management  ?Direct Dial: [phone] ? ?

## 2022-01-26 ENCOUNTER — Ambulatory Visit: Payer: Medicare Other

## 2022-01-26 NOTE — Patient Instructions (Signed)
?Ms. Havel  it was nice speaking with you. Please call me directly [phone] if you have questions about the goals we discussed. ? ? Goals Addressed   ?None ?  ? ? ?Patient Care Plan: General Social Work (Adult)  ?  ? ?Problem Identified: Emotional Distress   ?  ? ?Goal: Emotional Health Supported by connecting with support group   ?Start Date: 09/21/2021  ?Expected End Date: 10/07/2021  ?This Visit's Progress: On track  ?Priority: High  ?Note:   ?Current Barriers:  ?Disease Management support and education needs related to Grief ? ?CSW Clinical Goal(s):  ?Patient  will work with [facility] to address needs related to grief  through collaboration with Clinical Social Worker, provider, and care team.  ? ?Interventions: ?Inter-disciplinary care team collaboration (see longitudinal plan of care) ?Evaluation of current treatment plan related to  self management and patient's adherence to plan as established by provider ?Review resources, discussed options and provided patient information about  ?Options for mental health treatment based on need   ?[facility] and [facility]  ? ?Mental Health:  (Status: New goal. Patient declined further engagement on this goal.) ?Evaluation of current treatment plan related to Grief ?Solution-Focused Strategies employed: to connect to resources ?Active listening / Reflection utilized  ?Emotional Support Provided ?Problem Solving /Task Center strategies reviewed ?Made referral to [facility] and provided contact information  ? ?Patient Self-Care Activities: ?Call [facility] [phone] to follow up on support group option  ?I have placed a referral [facility] [phone] they will contact you.   ?If you have not heard from them follow up in a few days. ?  ? ?Patient Care Plan: RN Case Manager  ?  ? ?Problem Identified: General Plan of Carein a patient with HTN and DM II   ?  ? ?Long-Range Goal: To teach the patient knowledge and empowerer  her to maintain  blood sugar and blood pressure at adequate levels   ?Start Date: 01/26/2022  ?Expected End Date: 01/29/2023  ?Priority: High  ?Note:   ?The patient has demonstrated independence in self-care management of  HTN and DMII including adherence to the treatment plan established by the provider and care team, and collaboratively set goals have been met. The patient wishes to remain enrolled in care coordination services and requests ongoing follow-up for long-term health maintenance assistance.  ? ?Current Barriers:  ?Chronic disease management maintenance and care coordination needs related to HTN and DMII .  ? ?Nurse Case Manager Clinical Goal(s):  ?Over the next 3 months, patient will:  ?maintain self management of HTN and DMII as evidenced by adherence to provider prescribed treatment plan and long term plan for self health maintenance.  ?attend or call to reschedule all provider appointments.  ?Maintain adherence to prescribed medication regimen and will call the provider office for questions or concerns related to medications ?Call the provider office(s) to report changes in condition or new or worsening symptoms ?Interventions: ?1:1 collaboration with primary care provider regarding development and update of comprehensive plan of care as evidenced by provider attestation and co-signature ?Inter-disciplinary care team collaboration (see longitudinal plan of care) ? Confirmed agreement with patient that collaboratively established goals have been met.  ?Evaluated patient's consistent adherence to provider treatment/plan of care.  ?Ensured that patient has knowledge of upcoming scheduled provider appointments.  ?Confirmed that patient has contact information for care management team and appropriate providers and community agencies or resources.  ?Sending the patient educational information on DM II and HTN ?  01/26/22:  I contacted Mrs. Fallen by telephone for an initial visit; I talked with her and explained my role with  CCM. I completed a review of his medical history, allergies, medications, falls, and health status and inquired about SDOH; no needs were identified. Mrs. Danek informed me that she lives in the home alone and can perform her Adls and Iadls. She is very active and involved with her health but had questions about her blood sugar and blood pressure, which she wants to keep at acceptable levels. We discussed information about diet and exercise. I will send her information about both conditions, and she would like me to continue calling and talking with her. ?   ? ?Patient Goals/Self Care Activities: ?-Patient/Caregiver will self-administer medications as prescribed as evidenced by self-report/primary caregiver report  ?-Patient/Caregiver will attend all scheduled provider appointments as evidenced by clinician review of documented attendance to scheduled appointments and patient/caregiver report ?-Patient/Caregiver will call pharmacy for medication refills as evidenced by patient report and review of pharmacy fill history as appropriate ?-Patient/Caregiver will call provider office for new concerns or questions as evidenced by review of documented incoming telephone call notes and patient report ?-Patient/Caregiver verbalizes understanding of plan ?-Patient/Caregiver will focus on medication adherence by taking medications as prescribed ? ?Plan: The care management team will reach out to the patient again by phone over the next  2 weeks.  for ongoing follow up of  care coordination needs. The patient will reach out to the provider office or care management team for new issues/questions/concerns or to request assistance with healthcare needs.  ? ? ? ? ? ? ? ?  ?  ? ?Ms. Terral received Care Management services today:  ?Care Management services include personalized support from designated clinical staff supervised by her physician, including individualized plan of care and coordination with other care providers ?24/7  contact [phone] for assistance for urgent and routine care needs. ?Care Management are voluntary services and be declined at any time by calling the office. ? ?The patient verbalized understanding of instructions provided today and agreed to receive a mailed copy of patient instruction and/or educational materials.  ? ?Follow Up Plan: Patient would like continued follow-up.  CCM RNCM will outreach the patient within the next 2 weeks.  Patient will call office if needed prior to next encounter ? ? ?Traci Coles, RN  ? ?[phone]  ?

## 2022-01-26 NOTE — Chronic Care Management (AMB) (Signed)
? Care Management ?  ? RN Visit Note ? ?01/26/2022 ?Name: Terri Estrada MRN: [id number] DOB: October 19, 1950 ? ?Subjective: ?Terri Estrada is a 71 y.o. year old female who is a primary care patient of Simmons-Robinson, Makiera, MD. The care management team was consulted for assistance with disease management and care coordination needs.   ? ?Engaged with patient by telephone for initial visit in response to provider referral for case management and/or care coordination services.  ? ?Consent to Services:  ? Ms. [name] was given information about Care Management services today including:  ?Care Management services includes personalized support from designated clinical staff supervised by her physician, including individualized plan of care and coordination with other care providers ?24/7 contact phone numbers for assistance for urgent and routine care needs. ?The patient may stop case management services at any time by phone call to the office staff. ? ?Patient agreed to services and consent obtained.  ? ? ?Summary: The patient is making significant efforts toward positive changes in her chronic conditions.. See Care Plan below for interventions and patient self-care actives. ? ?Recommendation: The patient may benefit from taking medications as prescribed, exercising as tolerated, checking Blood sugar and record values, checking blood pressures and record values, and The patient agrees. ? ?Follow up Plan: Patient would like continued follow-up.  CCM RNCM will outreach the patient within the next 2 weeks.  Patient will call office if needed prior to next encounter ? ? ?Assessment: Review of patient past medical history, allergies, medications, health status, including review of consultants reports, laboratory and other test data, was performed as part of comprehensive evaluation and provision of chronic care management services.  ? ?SDOH (Social Determinants of Health) assessments and interventions performed:   No ? ?Care Plan ? ? ? ?Conditions to be addressed/monitored: HTN and DMII ? ?Care Plan : RN Case Manager  ?Updates made by Lazaro Arms, RN since 01/26/2022 12:00 AM  ?  ? ?Problem: General Plan of Carein a patient with HTN and DM II   ?  ? ?Long-Range Goal: To teach the patient knowledge and empowerer  her to maintain blood sugar and blood pressure at adequate levels   ?Start Date: 01/26/2022  ?Expected End Date: 01/29/2023  ?Priority: High  ?Note:   ?The patient has demonstrated independence in self-care management of  HTN and DMII including adherence to the treatment plan established by the provider and care team, and collaboratively set goals have been met. The patient wishes to remain enrolled in care coordination services and requests ongoing follow-up for long-term health maintenance assistance.  ? ?Current Barriers:  ?Chronic disease management maintenance and care coordination needs related to HTN and DMII .  ? ?Nurse Case Manager Clinical Goal(s):  ?Over the next 3 months, patient will:  ?maintain self management of HTN and DMII as evidenced by adherence to provider prescribed treatment plan and long term plan for self health maintenance.  ?attend or call to reschedule all provider appointments.  ?Maintain adherence to prescribed medication regimen and will call the provider office for questions or concerns related to medications ?Call the provider office(s) to report changes in condition or new or worsening symptoms ?Interventions: ?1:1 collaboration with primary care provider regarding development and update of comprehensive plan of care as evidenced by provider attestation and co-signature ?Inter-disciplinary care team collaboration (see longitudinal plan of care) ? Confirmed agreement with patient that collaboratively established goals have been met.  ?Evaluated patient's consistent adherence to provider treatment/plan of care.  ?Ensured  that patient has knowledge of upcoming scheduled provider  appointments.  ?Confirmed that patient has contact information for care management team and appropriate providers and community agencies or resources.  ?Sending the patient educational information on DM II and HTN ?01/26/22:  I contacted Terri Estrada by telephone for an initial visit; I talked with her and explained my role with CCM. I completed a review of his medical history, allergies, medications, falls, and health status and inquired about SDOH; no needs were identified. Terri Estrada informed me that she lives in the home alone and can perform her Adls and Iadls. She is very active and involved with her health but had questions about her blood sugar and blood pressure, which she wants to keep at acceptable levels. We discussed information about diet and exercise. I will send her information about both conditions, and she would like me to continue calling and talking with her. ?   ? ?Patient Goals/Self Care Activities: ?-Patient/Caregiver will self-administer medications as prescribed as evidenced by self-report/primary caregiver report  ?-Patient/Caregiver will attend all scheduled provider appointments as evidenced by clinician review of documented attendance to scheduled appointments and patient/caregiver report ?-Patient/Caregiver will call pharmacy for medication refills as evidenced by patient report and review of pharmacy fill history as appropriate ?-Patient/Caregiver will call provider office for new concerns or questions as evidenced by review of documented incoming telephone call notes and patient report ?-Patient/Caregiver verbalizes understanding of plan ?-Patient/Caregiver will focus on medication adherence by taking medications as prescribed ? ?Plan: The care management team will reach out to the patient again by phone over the next  2 weeks.  for ongoing follow up of  care coordination needs. The patient will reach out to the provider office or care management team for new issues/questions/concerns  or to request assistance with healthcare needs.  ? ? ? ? ? ? ? ?  ? ?Lazaro Arms RN, BSN, [place] ?Care Management Coordinator ?[employer]  ?Phone: [phone]  ?  ? ?

## 2022-02-09 ENCOUNTER — Ambulatory Visit: Payer: Medicare Other

## 2022-02-09 NOTE — Chronic Care Management (AMB) (Signed)
?Chronic Care Management  ? ?CCM RN Visit Note ? ?02/09/2022 ?Name: Terri Estrada MRN: [id number] DOB: 02-16-1951 ? ?Subjective: ?Terri Estrada is a 71 y.o. year old female who is a primary care patient of Simmons-Robinson, Makiera, MD. The care management team was consulted for assistance with disease management and care coordination needs.   ? ?Engaged with patient by telephone for follow up visit in response to provider referral for case management and/or care coordination services.  ? ?Consent to Services:  ?The patient was given information about Chronic Care Management services, agreed to services, and gave verbal consent prior to initiation of services.  Please see initial visit note for detailed documentation.  ? ?Patient agreed to services and verbal consent obtained.  ? ? ? ?Summary: The patient is making significant efforts toward positive changes in her chronic conditions.. She is beginning to check her vitals and record the values See Care Plan below for interventions and patient self-care actives. ? ?Recommendation: The patient may benefit from taking medications as prescribed, exercising as tolerated, monitoring food intake, watch fluid intake, checking Blood sugar and record values, checking blood pressures and record values, calling your physician if numbers are abnormal, and The patient agrees. ? ?Follow up Plan: Patient would like continued follow-up.  CCM RNCM will outreach the patient within the next 5 weeks.  Patient will call office if needed prior to next encounter ? ?Assessment: Review of patient past medical history, allergies, medications, health status, including review of consultants reports, laboratory and other test data, was performed as part of comprehensive evaluation and provision of chronic care management services.  ? ?SDOH (Social Determinants of Health) assessments and interventions performed:  No ? ?CCM Care Plan ? ?Conditions to be addressed/monitored:HTN and  DMII ? ?Care Plan : RN Case Manager  ?Updates made by Lazaro Arms, RN since 02/09/2022 12:00 AM  ?  ? ?Problem: General Plan of Carein a patient with HTN and DM II   ?  ? ?Long-Range Goal: To teach the patient knowledge and empowerer  her to maintain blood sugar and blood pressure at adequate levels   ?Start Date: 01/26/2022  ?Expected End Date: 01/29/2023  ?Priority: High  ?Note:   ?The patient has demonstrated independence in self-care management of  HTN and DMII including adherence to the treatment plan established by the provider and care team, and collaboratively set goals have been met. The patient wishes to remain enrolled in care coordination services and requests ongoing follow-up for long-term health maintenance assistance.  ? ?Current Barriers:  ?Chronic disease management maintenance and care coordination needs related to HTN and DMII .  ? ?Nurse Case Manager Clinical Goal(s):  ?Over the next 3 months, patient will:  ?maintain self management of HTN and DMII as evidenced by adherence to provider prescribed treatment plan and long term plan for self health maintenance.  ?attend or call to reschedule all provider appointments.  ?Maintain adherence to prescribed medication regimen and will call the provider office for questions or concerns related to medications ?Call the provider office(s) to report changes in condition or new or worsening symptoms ?Interventions: ?1:1 collaboration with primary care provider regarding development and update of comprehensive plan of care as evidenced by provider attestation and co-signature ?Inter-disciplinary care team collaboration (see longitudinal plan of care) ? Confirmed agreement with patient that collaboratively established goals have been met.  ?Evaluated patient's consistent adherence to provider treatment/plan of care.  ?Ensured that patient has knowledge of upcoming scheduled provider appointments.  ?Confirmed  that patient has contact information for care  management team and appropriate providers and community agencies or resources.  ?Sending the patient educational information on DM II and HTN ?02/09/22:  I had a conversation with [name] and she is doing well. She started monitoring her blood sugar this week and it was at 115. Her blood pressure readings were 128/29, 125/38, 120/60, and 120/58, but some of them seemed inaccurate. She mentioned that she would be getting a new monitor today. All the readings were taken in the morning. I sent her some information which she received, but hasn't had time to read it all yet. While she took a break from exercising this week to assist her sister with some health concerns, she plans to resume next week.   ? ?Patient Goals/Self Care Activities: ?-Patient/Caregiver will self-administer medications as prescribed as evidenced by self-report/primary caregiver report  ?-Patient/Caregiver will attend all scheduled provider appointments as evidenced by clinician review of documented attendance to scheduled appointments and patient/caregiver report ?-Patient/Caregiver will call pharmacy for medication refills as evidenced by patient report and review of pharmacy fill history as appropriate ?-Patient/Caregiver will call provider office for new concerns or questions as evidenced by review of documented incoming telephone call notes and patient report ?-Patient/Caregiver verbalizes understanding of plan ?-Patient/Caregiver will focus on medication adherence by taking medications as prescribed ? ?Plan: The care management team will reach out to the patient again by phone over the next  1 month  for ongoing follow up of  care coordination needs. The patient will reach out to the provider office or care management team for new issues/questions/concerns or to request assistance with healthcare needs.  ? ? ? ? ? ? ? ?  ? ?Lazaro Arms RN, BSN, [place] ?Care Management Coordinator ?[place]  ?Phone: [phone]  ?   ? ? ? ? ? ? ? ?

## 2022-02-09 NOTE — Patient Instructions (Signed)
Visit Information ? ?Ms. Breton  it was nice speaking with you. Please call me directly [phone] if you have questions about the goals we discussed. ? ?  ?Patient Goals/Self Care Activities: ?-Patient/Caregiver will self-administer medications as prescribed as evidenced by self-report/primary caregiver report  ?-Patient/Caregiver will attend all scheduled provider appointments as evidenced by clinician review of documented attendance to scheduled appointments and patient/caregiver report ?-Patient/Caregiver will call pharmacy for medication refills as evidenced by patient report and review of pharmacy fill history as appropriate ?-Patient/Caregiver will call provider office for new concerns or questions as evidenced by review of documented incoming telephone call notes and patient report ?-Patient/Caregiver verbalizes understanding of plan ?-Patient/Caregiver will focus on medication adherence by taking medications as prescribed ?  ?Plan: The care management team will reach out to the patient again by phone over the next  1 month  for ongoing follow up of  care coordination needs. The patient will reach out to the provider office or care management team for new issues/questions/concerns or to request assistance with healthcare needs.  ?  ?  ?  ? ? ?Patient verbalizes understanding of instructions and care plan provided today and agrees to view in [place]. Active MyChart status confirmed with patient.   ? ? ?Lazaro Arms, RN ? ?[phone]  ?

## 2022-02-15 ENCOUNTER — Telehealth: Payer: Self-pay | Admitting: Orthopaedic Surgery

## 2022-02-15 NOTE — Telephone Encounter (Signed)
Please advise 

## 2022-02-15 NOTE — Telephone Encounter (Signed)
Patient called needing Rx refilled Tramadol. The number to contact patient is [phone]. ?

## 2022-02-16 ENCOUNTER — Other Ambulatory Visit: Payer: Self-pay | Admitting: Physician Assistant

## 2022-02-16 MED ORDER — TRAMADOL HCL 50 MG PO TABS: 50.0000 mg | ORAL_TABLET | Freq: Two times a day (BID) | ORAL | 2 refills | Status: DC | PRN

## 2022-02-16 NOTE — Telephone Encounter (Signed)
sent 

## 2022-02-16 NOTE — Telephone Encounter (Signed)
Lvm informing 

## 2022-03-13 ENCOUNTER — Ambulatory Visit: Payer: Medicare Other

## 2022-03-13 NOTE — Patient Instructions (Signed)
Visit Information  Ms. Heward  it was nice speaking with you. Please call me directly [phone] if you have questions about the goals we discussed.  Patient Goals/Self Care Activities: -Patient/Caregiver will self-administer medications as prescribed as evidenced by self-report/primary caregiver report  -Patient/Caregiver will attend all scheduled provider appointments as evidenced by clinician review of documented attendance to scheduled appointments and patient/caregiver report -Patient/Caregiver will call pharmacy for medication refills as evidenced by patient report and review of pharmacy fill history as appropriate -Patient/Caregiver will call provider office for new concerns or questions as evidenced by review of documented incoming telephone call notes and patient report -Patient/Caregiver verbalizes understanding of plan -Patient/Caregiver will focus on medication adherence by taking medications as prescribed   Patient verbalizes understanding of instructions and care plan provided today and agrees to view in MyChart. Active MyChart status and patient understanding of how to access instructions and care plan via MyChart confirmed with patient.     Follow up Plan: Patient would like continued follow-up.  CCM RNCM will outreach the patient within the next 4 weeks.  Patient will call office if needed prior to next encounter  Juanell Fairly, RN  [phone]

## 2022-03-13 NOTE — Chronic Care Management (AMB) (Signed)
Chronic Care Management   CCM RN Visit Note  03/13/2022 Name: Terri Estrada MRN: [id number] DOB: 28-Dec-1950  Subjective: Terri Estrada is a 71 y.o. year old female who is a primary care patient of Simmons-Robinson, Makiera, MD. The care management team was consulted for assistance with disease management and care coordination needs.    Engaged with patient by telephone for follow up visit in response to provider referral for case management and/or care coordination services.   Consent to Services:  The patient was given information about Chronic Care Management services, agreed to services, and gave verbal consent prior to initiation of services.  Please see initial visit note for detailed documentation.   Patient agreed to services and verbal consent obtained.  Summary:  The patient is staying consistent with taking her medications, exercising and checking her vitals.  She is having some problems with staying a sleep and unsure why. . See Care Plan below for interventions and patient self-care actives.  Recommendation: The patient may benefit from taking medications as prescribed, exercising as tolerated, monitoring food intake, checking Blood sugar and record values, checking blood pressures and record values, calling your physician if numbers are abnormal, and The patient agrees.  Follow up Plan: Patient would like continued follow-up.  CCM RNCM will outreach the patient within the next 4 weeks.  Patient will call office if needed prior to next encounter   Assessment: Review of patient past medical history, allergies, medications, health status, including review of consultants reports, laboratory and other test data, was performed as part of comprehensive evaluation and provision of chronic care management services.   SDOH (Social Determinants of Health) assessments and interventions performed:  No  Conditions to be addressed/monitored:HTN and DMII  Care Plan : RN Case  Manager  Updates made by Lazaro Arms, RN since 03/13/2022 12:00 AM     Problem: General Plan of Carein a patient with HTN and DM II      Long-Range Goal: To teach the patient knowledge and empowerer  her to maintain blood sugar and blood pressure at adequate levels   Start Date: 01/26/2022  Expected End Date: 01/29/2023  Priority: High  Note:   The patient has demonstrated independence in self-care management of  HTN and DMII including adherence to the treatment plan established by the provider and care team, and collaboratively set goals have been met. The patient wishes to remain enrolled in care coordination services and requests ongoing follow-up for long-term health maintenance assistance.   Current Barriers:  Chronic disease management maintenance and care coordination needs related to HTN and DMII .   Nurse Case Manager Clinical Goal(s):  Over the next 3 months, patient will:  maintain self management of HTN and DMII as evidenced by adherence to provider prescribed treatment plan and long term plan for self health maintenance.  attend or call to reschedule all provider appointments.  Maintain adherence to prescribed medication regimen and will call the provider office for questions or concerns related to medications Call the provider office(s) to report changes in condition or new or worsening symptoms Interventions: 1:1 collaboration with primary care provider regarding development and update of comprehensive plan of care as evidenced by provider attestation and co-signature Inter-disciplinary care team collaboration (see longitudinal plan of care)  Confirmed agreement with patient that collaboratively established goals have been met.  Evaluated patient's consistent adherence to provider treatment/plan of care.  Ensured that patient has knowledge of upcoming scheduled provider appointments.  Confirmed that patient has contact  information for care management team and appropriate  providers and community agencies or resources.  Sending the patient educational information on DM II and HTN 03/13/22:  Terri Estrada is doing well and has been monitoring her health closely. She recently bought a blood pressure monitor and has been checking her blood pressure every day. In the morning, her readings have ranged from 121/65 to 136/65. Additionally, she checks her blood sugar once a week and has obtained two results of 112 and 114. She is committed to exercising daily, monitoring her food, and taking her medications as prescribed. However, she is experiencing difficulty falling and staying asleep, even though she takes melatonin as directed. She is unsure of the cause of her sleep problems.  Patient Goals/Self Care Activities: -Patient/Caregiver will self-administer medications as prescribed as evidenced by self-report/primary caregiver report  -Patient/Caregiver will attend all scheduled provider appointments as evidenced by clinician review of documented attendance to scheduled appointments and patient/caregiver report -Patient/Caregiver will call pharmacy for medication refills as evidenced by patient report and review of pharmacy fill history as appropriate -Patient/Caregiver will call provider office for new concerns or questions as evidenced by review of documented incoming telephone call notes and patient report -Patient/Caregiver verbalizes understanding of plan -Patient/Caregiver will focus on medication adherence by taking medications as prescribed  Plan: The care management team will reach out to the patient again by phone over the next  1 month  for ongoing follow up of  care coordination needs. The patient will reach out to the provider office or care management team for new issues/questions/concerns or to request assistance with healthcare needs.            Lazaro Arms RN, BSN, [facility] Care Management Coordinator [facility]  Phone: [phone]

## 2022-03-14 ENCOUNTER — Encounter: Payer: Self-pay | Admitting: Orthopaedic Surgery

## 2022-03-14 ENCOUNTER — Ambulatory Visit (INDEPENDENT_AMBULATORY_CARE_PROVIDER_SITE_OTHER): Payer: Medicare Other | Admitting: Physician Assistant

## 2022-03-14 ENCOUNTER — Ambulatory Visit: Payer: Self-pay

## 2022-03-14 ENCOUNTER — Encounter: Payer: Self-pay | Admitting: *Deleted

## 2022-03-14 ENCOUNTER — Ambulatory Visit (INDEPENDENT_AMBULATORY_CARE_PROVIDER_SITE_OTHER): Payer: Medicare Other

## 2022-03-14 DIAGNOSIS — Z96652 Presence of left artificial knee joint: Secondary | ICD-10-CM

## 2022-03-14 DIAGNOSIS — Z96651 Presence of right artificial knee joint: Secondary | ICD-10-CM

## 2022-03-14 MED ORDER — METHOCARBAMOL 500 MG PO TABS: 500.0000 mg | ORAL_TABLET | Freq: Two times a day (BID) | ORAL | 2 refills | Status: DC | PRN

## 2022-03-14 NOTE — Progress Notes (Unsigned)
Post-Op Visit Note   Patient: Terri Estrada           Date of Birth: 01-23-1951           MRN: [id number] Visit Date: 03/14/2022 PCP: Ronnald Ramp, MD   Assessment & Plan:  Chief Complaint:  Chief Complaint  Patient presents with   Right Knee - Follow-up    Right total knee arthroplasty 02/23/2020   Left Knee - Follow-up    Left total knee arthroplasty 12/27/2020   Visit Diagnoses:  1. Status post total right knee replacement   2. Status post total left knee replacement     Plan: Patient is a pleasant 71 year old female who comes in today for follow-up of bilateral total knee replacements; right 02/23/2020 and left 12/27/2020.  She has been doing great in regards to the left knee.  She has had tightness to the right lower leg with occasional swelling to the right knee for several months.  She denies any actual pain.  She does note that she is working out at [employer] and is walking quite a bit for exercise.  She is unsure if one of the exercises may be causing the symptoms.  No fevers or chills.  Examination of both knees reveals no effusion.  Range of motion 0 to 110 degrees.  Stable valgus varus stress.  She is neurovascular intact distally.  At this point, I have instructed her to make a log of the exercises she is doing at the gym to see if any of these may be contributing to her symptoms as the symptoms seem to be intermittent.  She will follow-up with [place] in 1 years time for repeat evaluation and 2 view x-rays of the left knee.  Dental prophylaxis reinforced for another year.  Call with concerns or questions.  Follow-Up Instructions: Return in about 1 year (around 03/15/2023).   Orders:  Orders Placed This Encounter  Procedures   XR Knee 1-2 Views Right   XR Knee 1-2 Views Left   Meds ordered this encounter  Medications   methocarbamol (ROBAXIN) 500 MG tablet    Sig: Take 1 tablet (500 mg total) by mouth 2 (two) times daily as needed for muscle spasms.     Dispense:  30 tablet    Refill:  2    Imaging: XR Knee 1-2 Views Left  Result Date: 03/14/2022 Well-seated prosthesis without complication  XR Knee 1-2 Views Right  Result Date: 03/14/2022 Well-seated prosthesis without complication   PMFS History: Patient Active Problem List   Diagnosis Date Noted   Grief at loss of child 09/15/2021   Status post total left knee replacement 12/27/2020   Primary osteoarthritis of left knee 12/26/2020   Fatigue 06/18/2020   Status post total right knee replacement 02/23/2020   Acute seasonal allergic rhinitis 12/19/2019   Pre-op evaluation 10/23/2019   Murmur 12/10/2015   Pes planus of both feet 01/07/2015   Depression 01/07/2015   Female cystocele 06/26/2014   Healthcare maintenance 12/17/2012   Osteoarthritis 06/21/2010   Hyperlipidemia 05/31/2009   DIVERTICULOSIS, COLON 10/16/2008   AORTIC STENOSIS, MILD 06/02/2008   Diabetes mellitus type 2, controlled, without complications (HCC) 12/06/2006   Obesity (BMI 30-39.9) 12/06/2006   SICKLE CELL TRAIT 12/06/2006   HYPERTENSION, BENIGN SYSTEMIC 12/06/2006   CORONARY, ARTERIOSCLEROSIS 12/06/2006   Past Medical History:  Diagnosis Date   Arthritis    CAD in native artery    a. MI 2001 s/p PTCA/stenting to mid-distal junction of RCA -  residual dx treated medically.   Depression    pt reports having it intermittently   Diverticulosis of colon (without mention of hemorrhage)    Essential hypertension, benign    MI (myocardial infarction) (HCC)    Mild pulmonary hypertension (HCC)    Obesity, unspecified    Onychia and paronychia of toe    Osteoarthrosis, unspecified whether generalized or localized, unspecified site    Other and unspecified hyperlipidemia    PONV (postoperative nausea and vomiting)    Sickle-cell trait (HCC)    Sinus bradycardia    a. HR 40s even on low dose metoprolol - BB stopped with resolution.   Streptococcal sore throat    Type II or unspecified type diabetes  mellitus without mention of complication, not stated as uncontrolled     Family History  Problem Relation Age of Onset   Sickle cell anemia Daughter    Hypertension Mother    Diabetes type II Mother    Diabetes type II Sister    Breast cancer Sister    Cancer Other    Stroke Other    Diabetes Other     Past Surgical History:  Procedure Laterality Date   ANGIOPLASTY  09/21/00   CARDIAC CATHETERIZATION     JOINT REPLACEMENT     TOTAL KNEE ARTHROPLASTY Right 02/23/2020   Procedure: RIGHT TOTAL KNEE ARTHROPLASTY;  Surgeon: Tarry Kos, MD;  Location: [facility] OR;  Service: Orthopedics;  Laterality: Right;   TOTAL KNEE ARTHROPLASTY Left 12/27/2020   Procedure: LEFT TOTAL KNEE ARTHROPLASTY;  Surgeon: Tarry Kos, MD;  Location: [facility] OR;  Service: Orthopedics;  Laterality: Left;   Social History   Occupational History   Occupation: DAY Associate Professor: SELF-EMPLOYED  Tobacco Use   Smoking status: Former   Smokeless tobacco: Never   Tobacco comments:    quit nov 2001  Vaping Use   Vaping Use: Never used  Substance and Sexual Activity   Alcohol use: No   Drug use: No   Sexual activity: Yes    Birth control/protection: Post-menopausal

## 2022-03-22 ENCOUNTER — Ambulatory Visit: Payer: Medicare Other | Admitting: Family Medicine

## 2022-04-14 ENCOUNTER — Ambulatory Visit: Payer: Medicare Other

## 2022-04-14 NOTE — Patient Instructions (Signed)
Visit Information  Ms. Scheirer  it was nice speaking with you. Please call me directly [phone] if you have questions about the goals we discussed. [name] My role is changing in the practice and I may not be available. I wanted to reach out to see how you were progressing on your goals Goals reach. Please give me a call if you have questions or would like to speak to me before I disconnect from your care team    Patient verbalizes understanding of instructions and care plan provided today and agrees to view in MyChart. Active MyChart status and patient understanding of how to access instructions and care plan via MyChart confirmed with patient.     Follow up Plan: If further intervention is needed the care management team is available to follow up after a formal CM referral is placed  and No follow up scheduled with CM team at this time  Juanell Fairly, RN  [phone]

## 2022-04-14 NOTE — Chronic Care Management (AMB) (Signed)
Chronic Care Management   CCM RN Visit Note  04/14/2022 Name: Terri Estrada MRN: [id number] DOB: November 11, 1950  Subjective: Terri Estrada is a 71 y.o. year old female who is a primary care patient of Sowell, Erlene Quan, MD. The care management team was consulted for assistance with disease management and care coordination needs.    Engaged with patient by telephone for follow up visit in response to provider referral for case management and/or care coordination services.   Consent to Services:  The patient was given information about Chronic Care Management services, agreed to services, and gave verbal consent prior to initiation of services.  Please see initial visit note for detailed documentation.   Patient agreed to services and verbal consent obtained.    Summary:  The patient continues to maintain positive progress with care plan goals. See Care Plan below for interventions and patient self-care actives.  Recommendation: The patient may benefit from taking medications as prescribed, checking Blood sugar and record values, checking blood pressures and record values, and The patient agrees.  Follow up Plan: If further intervention is needed the care management team is available to follow up after a formal CM referral is placed  and No follow up scheduled with CM team at this time   Assessment: Review of patient past medical history, allergies, medications, health status, including review of consultants reports, laboratory and other test data, was performed as part of comprehensive evaluation and provision of chronic care management services.   SDOH (Social Determinants of Health) assessments and interventions performed:  no   Conditions to be addressed/monitored:HTN and DMII  Care Plan : RN Case Manager  Updates made by Lazaro Arms, RN since 04/14/2022 12:00 AM     Problem: General Plan of Carein a patient with HTN and DM II      Long-Range Goal: To teach the patient  knowledge and empowerer  her to maintain blood sugar and blood pressure at adequate levels Completed 04/14/2022  Start Date: 01/26/2022  Expected End Date: 05/09/2023  Priority: High  Note:   The patient has demonstrated independence in self-care management of  HTN and DMII including adherence to the treatment plan established by the provider and care team, and collaboratively set goals have been met. The patient wishes to remain enrolled in care coordination services and requests ongoing follow-up for long-term health maintenance assistance.   Current Barriers:  Chronic disease management maintenance and care coordination needs related to HTN and DMII .   Nurse Case Manager Clinical Goal(s):  Over the next 3 months, patient will:  maintain self management of HTN and DMII as evidenced by adherence to provider prescribed treatment plan and long term plan for self health maintenance.  attend or call to reschedule all provider appointments.  Maintain adherence to prescribed medication regimen and will call the provider office for questions or concerns related to medications Call the provider office(s) to report changes in condition or new or worsening symptoms Interventions: 1:1 collaboration with primary care provider regarding development and update of comprehensive plan of care as evidenced by provider attestation and co-signature Inter-disciplinary care team collaboration (see longitudinal plan of care)  Confirmed agreement with patient that collaboratively established goals have been met.  Evaluated patient's consistent adherence to provider treatment/plan of care.  Ensured that patient has knowledge of upcoming scheduled provider appointments.  Confirmed that patient has contact information for care management team and appropriate providers and community agencies or resources.  Sending the patient educational information on DM  II and HTN 04/14/22:  After conversing with the patient, I'm pleased  to report that she is doing well. Her blood pressure levels have improved from 140/62 to 114/72, and her blood sugar levels are consistently good. She sticks to her daily walks, closely monitors her diet, and takes her medications as prescribed. She has successfully achieved her goals, and we both agreed that further calls are no longer necessary. However, she knows that she can contact her physician if she needs any assistance, and they will inform me accordingly.   Patient Goals/Self Care Activities: -Patient/Caregiver will self-administer medications as prescribed as evidenced by self-report/primary caregiver report  -Patient/Caregiver will attend all scheduled provider appointments as evidenced by clinician review of documented attendance to scheduled appointments and patient/caregiver report -Patient/Caregiver will call pharmacy for medication refills as evidenced by patient report and review of pharmacy fill history as appropriate -Patient/Caregiver will call provider office for new concerns or questions as evidenced by review of documented incoming telephone call notes and patient report -Patient/Caregiver verbalizes understanding of plan -Patient/Caregiver will focus on medication adherence by taking medications as prescribed          Lazaro Arms RN, BSN, [place] Management Coordinator [facility]  Phone: [phone]

## 2022-04-17 ENCOUNTER — Ambulatory Visit (INDEPENDENT_AMBULATORY_CARE_PROVIDER_SITE_OTHER): Payer: Medicare Other | Admitting: Family Medicine

## 2022-04-17 ENCOUNTER — Ambulatory Visit: Payer: Medicare Other | Admitting: Family Medicine

## 2022-04-17 ENCOUNTER — Encounter: Payer: Self-pay | Admitting: Family Medicine

## 2022-04-17 VITALS — BP 118/67 | HR 68 | Ht 62.0 in | Wt 192.0 lb

## 2022-04-17 DIAGNOSIS — E119 Type 2 diabetes mellitus without complications: Secondary | ICD-10-CM

## 2022-04-17 DIAGNOSIS — I10 Essential (primary) hypertension: Secondary | ICD-10-CM | POA: Diagnosis not present

## 2022-04-17 LAB — POCT UA - GLUCOSE/PROTEIN
Glucose, UA: NEGATIVE
Protein, UA: NEGATIVE

## 2022-04-17 LAB — POCT GLYCOSYLATED HEMOGLOBIN (HGB A1C): HbA1c, POC (controlled diabetic range): 6 % (ref 0.0–7.0)

## 2022-04-17 NOTE — Patient Instructions (Addendum)
Good to see you today - Thank you for coming in!  Things we discussed today:  Your A1c is 6.0 today. Good job! Maintain a healthy weight and continue your exercise routine.   Your blood pressure is also well-controlled today, good job! Continue taking your amlodipine.   We will check your urine protein and blood lipids. If there are abnormal results, I will reach out via phone call. If everything is normal, I will send you the results via MyChart.  Come back to see me in 1 year for follow-up.

## 2022-04-17 NOTE — Assessment & Plan Note (Addendum)
A1c 6.0, stable and at goal. Well controlled with diet and exercise. Goes for walks with sister 3x a week. Very motivated and wants to avoid meds if possible. Foot exam notable for BL bunions. - Encouraged continued exercise and maintaining healthy weight\ - Cont atorvastatin 80mg  daily - f/u urine protein and lipid panel - f/u in 3-4 month

## 2022-04-17 NOTE — Progress Notes (Signed)
    SUBJECTIVE:   CHIEF COMPLAINT / HPI: T2DM f/u   DW is a 71yo F w/ PMHx of T2DM, HTN, and BL knee replacement that p/f diabetes management followup. Pt reports exercising with her sister about 3x per week, such as going for walks. She reports some weight gain and feels like it may increase her A1c.   PERTINENT  PMH / PSH: noted above  OBJECTIVE:   BP 118/67   Pulse 68   Ht 5\' 2"  (1.575 m)   Wt 192 lb (87.1 kg)   SpO2 98%   BMI 35.12 kg/m   Gen: Well-appearing woman resting comfortably Pulm: Normal WOB. CTAB. CV: RRR Foot exam: Sensation grossly intact BL. Bony protrudence of medial big toe base bilaterally. No ulcers.   ASSESSMENT/PLAN:   Diabetes mellitus type 2, controlled, without complications A1c 6.0, stable and at goal. Well controlled with diet and exercise. Goes for walks with sister 3x a week. Very motivated and wants to avoid meds if possible. Foot exam notable for BL bunions. - Encouraged continued exercise and maintaining healthy weight\ - Cont atorvastatin 80mg  daily - f/u urine protein and lipid panel - f/u in 3-4 month  HYPERTENSION, BENIGN SYSTEMIC BP 118/67. Well controlled on amlodipine.  - Cont Amlodipine 10mg  daily - Encouraged exercise and weight management as above   , MD [facility]

## 2022-04-17 NOTE — Assessment & Plan Note (Addendum)
BP 118/67. Well controlled on amlodipine.  - Cont Amlodipine 10mg  daily - Encouraged exercise and weight management as above

## 2022-04-18 LAB — LIPID PANEL
Chol/HDL Ratio: 2.2 ratio (ref 0.0–4.4)
Cholesterol, Total: 112 mg/dL (ref 100–199)
HDL: 51 mg/dL (ref >39)
LDL Chol Calc (NIH): 48 mg/dL (ref 0–99)
Triglycerides: 55 mg/dL (ref 0–149)
VLDL Cholesterol Cal: 13 mg/dL (ref 5–40)

## 2022-04-29 ENCOUNTER — Other Ambulatory Visit: Payer: Self-pay | Admitting: Physician Assistant

## 2022-05-01 IMAGING — MG MM DIGITAL SCREENING BILAT W/ TOMO AND CAD
6 of 10 series · 6 of 30 positions shown · non-contrast
Comparison: Previous exam(s).

CLINICAL DATA: Screening.

EXAM:
DIGITAL SCREENING BILATERAL MAMMOGRAM WITH TOMOSYNTHESIS AND CAD
TECHNIQUE: Bilateral screening digital craniocaudal and mediolateral oblique
mammograms were obtained. Bilateral screening digital breast
tomosynthesis was performed. The images were evaluated with
computer-aided detection.

[R CC synth-2D]
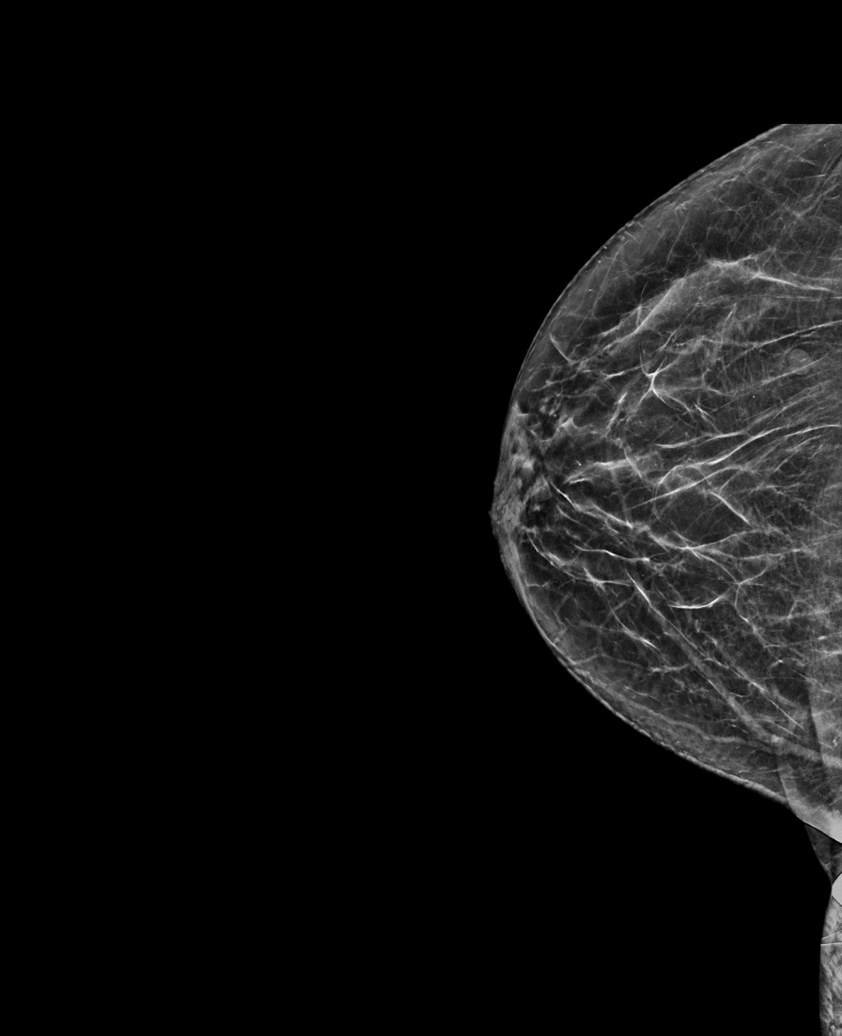

[L MLO synth-2D]
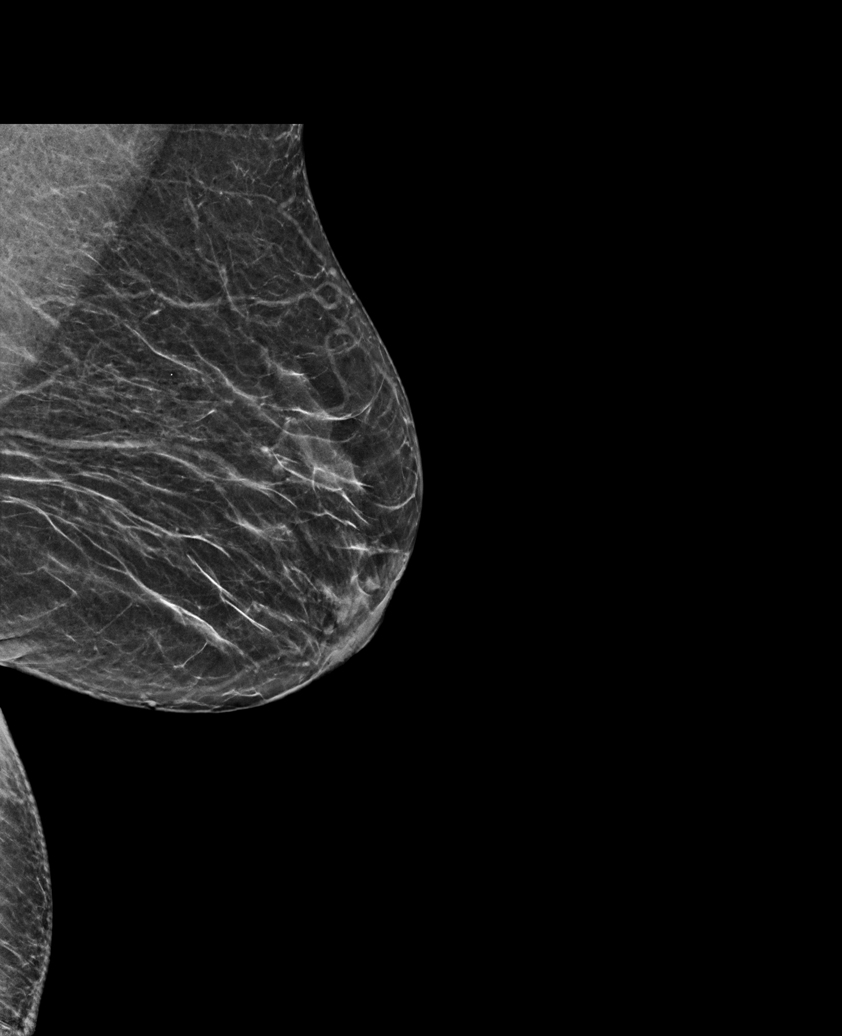

[L CC synth-2D]
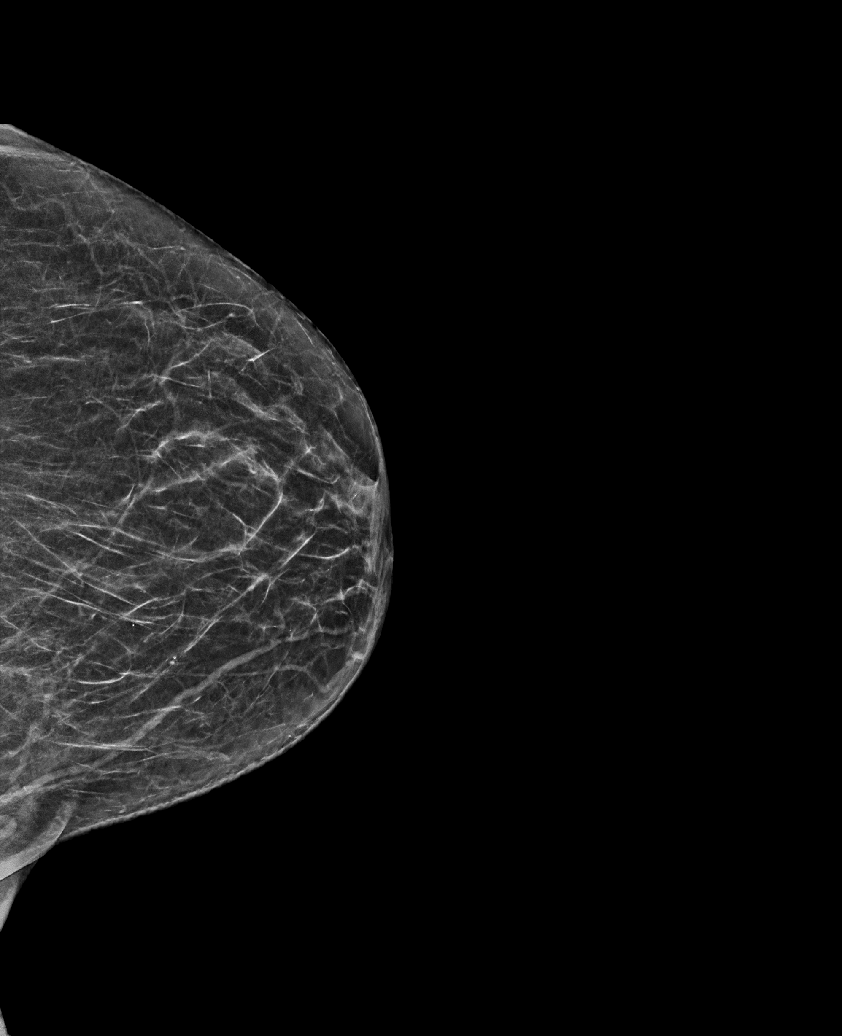

[R MLO synth-2D (1 of 2)]
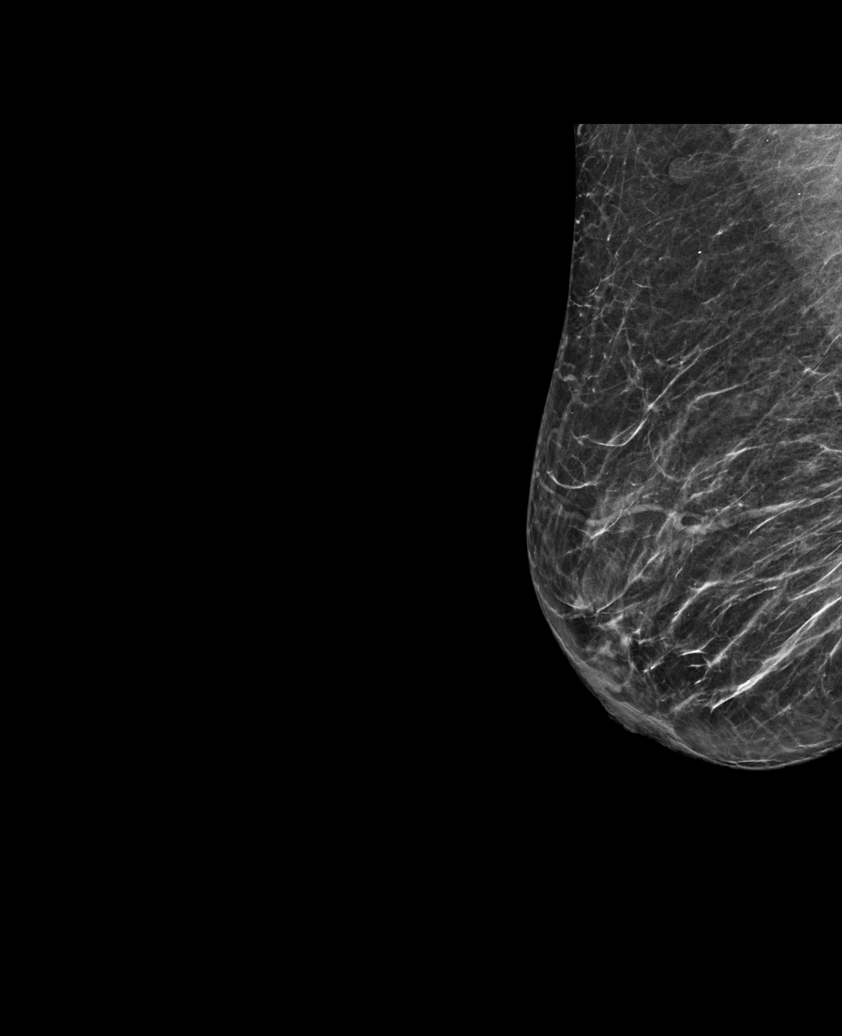

[R MLO synth-2D (2 of 2)]
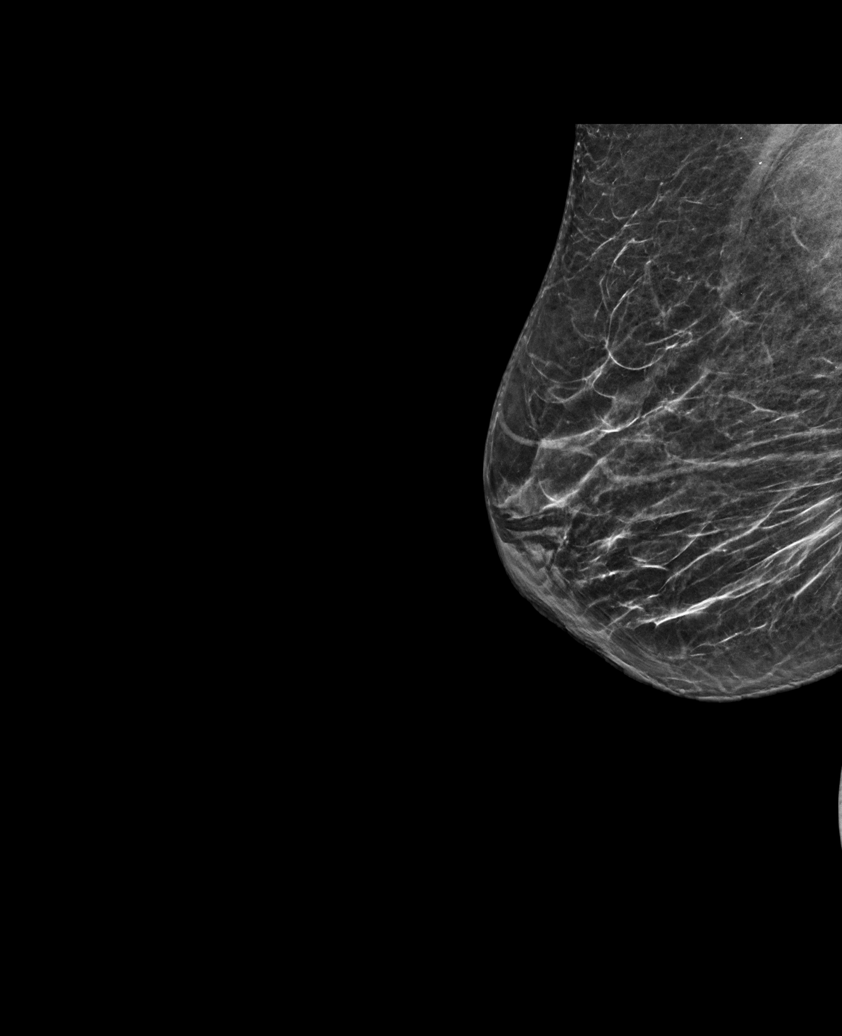

[R MLO tomo · tomo slice 27/53.0]
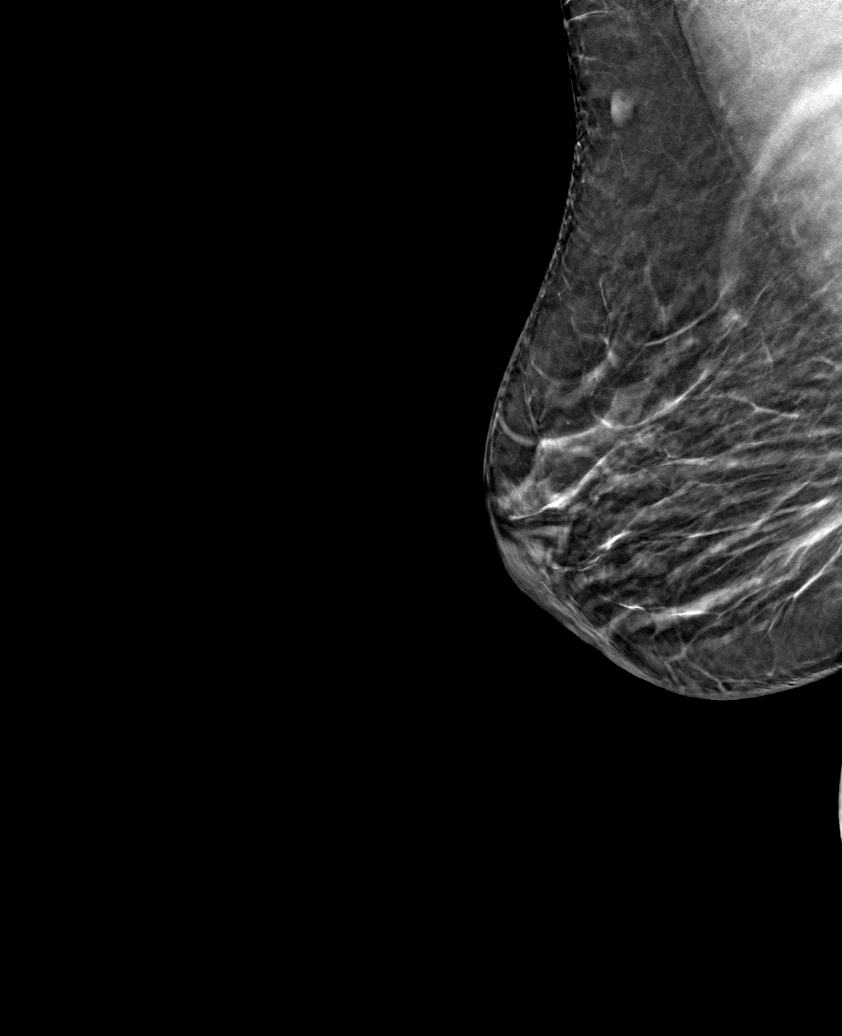

[6 of 30 positions shown; findings below may reference images not displayed]

ACR Breast Density Category b: There are scattered areas of
fibroglandular density.
FINDINGS: There are no findings suspicious for malignancy.
IMPRESSION: No mammographic evidence of malignancy. A result letter of this
screening mammogram will be mailed directly to the patient.

RECOMMENDATION:
Screening mammogram in one year. (Code:51-O-LD2)

BI-RADS CATEGORY  1: Negative.

## 2022-06-27 DIAGNOSIS — H5203 Hypermetropia, bilateral: Secondary | ICD-10-CM | POA: Diagnosis not present

## 2022-07-12 ENCOUNTER — Encounter: Payer: Self-pay | Admitting: Student

## 2022-07-12 LAB — HM DIABETES EYE EXAM

## 2022-08-11 DIAGNOSIS — Z4689 Encounter for fitting and adjustment of other specified devices: Secondary | ICD-10-CM | POA: Diagnosis not present

## 2022-08-13 ENCOUNTER — Other Ambulatory Visit: Payer: Self-pay | Admitting: Cardiovascular Disease

## 2022-08-13 DIAGNOSIS — I10 Essential (primary) hypertension: Secondary | ICD-10-CM

## 2022-08-13 DIAGNOSIS — E7800 Pure hypercholesterolemia, unspecified: Secondary | ICD-10-CM

## 2022-08-24 ENCOUNTER — Telehealth: Payer: Self-pay | Admitting: Orthopaedic Surgery

## 2022-08-24 NOTE — Telephone Encounter (Signed)
Pt called requesting a refill of tramadol. Please send to pharmacy on file. Pt phone number is [phone].

## 2022-08-25 ENCOUNTER — Other Ambulatory Visit: Payer: Self-pay | Admitting: Physician Assistant

## 2022-08-25 ENCOUNTER — Ambulatory Visit (INDEPENDENT_AMBULATORY_CARE_PROVIDER_SITE_OTHER): Payer: Medicare Other | Admitting: Podiatry

## 2022-08-25 DIAGNOSIS — M21619 Bunion of unspecified foot: Secondary | ICD-10-CM | POA: Diagnosis not present

## 2022-08-25 DIAGNOSIS — M2142 Flat foot [pes planus] (acquired), left foot: Secondary | ICD-10-CM

## 2022-08-25 DIAGNOSIS — M2041 Other hammer toe(s) (acquired), right foot: Secondary | ICD-10-CM | POA: Diagnosis not present

## 2022-08-25 DIAGNOSIS — M2141 Flat foot [pes planus] (acquired), right foot: Secondary | ICD-10-CM

## 2022-08-25 DIAGNOSIS — M2042 Other hammer toe(s) (acquired), left foot: Secondary | ICD-10-CM

## 2022-08-25 DIAGNOSIS — E119 Type 2 diabetes mellitus without complications: Secondary | ICD-10-CM

## 2022-08-25 DIAGNOSIS — Z794 Long term (current) use of insulin: Secondary | ICD-10-CM

## 2022-08-25 MED ORDER — TRAMADOL HCL 50 MG PO TABS: 50.0000 mg | ORAL_TABLET | Freq: Two times a day (BID) | ORAL | 2 refills | Status: DC | PRN

## 2022-08-25 NOTE — Telephone Encounter (Signed)
Notified patient.

## 2022-08-25 NOTE — Patient Instructions (Signed)

## 2022-08-25 NOTE — Telephone Encounter (Signed)
sent 

## 2022-08-27 NOTE — Progress Notes (Signed)
Subjective: Chief Complaint  Patient presents with   Diabetes    Patient in office for diabetic foot exam today. No concerns voiced.    71 year old female presents the office today for diabetic foot exam.  She states that he is doing well.  She denies any open lesions.  No claudication symptoms.  Her last A1c was 6.0 on April 17, 2022.  Objective: AAO x3, NAD DP/PT pulses palpable bilaterally, CRT less than 3 seconds Sensation intact with [employer] monofilament Bunions present bilaterally with hammertoe contracture noted at the second digit.   No open lesions are noted today.  There is no area of tenderness.  MMT 5/5.  Flatfoot present. No pain with calf compression, swelling, warmth, erythema  Assessment: 71 year old female presents today for diabetic foot exam  Plan: -All treatment options discussed with the patient including all alternatives, risks, complications.  -From a diabetes standpoint she is doing well.  Discussed continue with boot glucose control.  Continue daily foot inspection.  Supportive shoe gear. -Patient encouraged to call the office with any questions, concerns, change in symptoms.   Return in about 1 year (around 08/26/2023) for diabetic foot exam.  Vivi Barrack DPM

## 2022-09-06 ENCOUNTER — Ambulatory Visit (INDEPENDENT_AMBULATORY_CARE_PROVIDER_SITE_OTHER): Payer: Medicare Other | Admitting: Student

## 2022-09-06 ENCOUNTER — Encounter: Payer: Self-pay | Admitting: Student

## 2022-09-06 ENCOUNTER — Other Ambulatory Visit: Payer: Self-pay

## 2022-09-06 VITALS — BP 147/85 | HR 75 | Wt 189.4 lb

## 2022-09-06 DIAGNOSIS — R252 Cramp and spasm: Secondary | ICD-10-CM | POA: Diagnosis not present

## 2022-09-06 DIAGNOSIS — I10 Essential (primary) hypertension: Secondary | ICD-10-CM | POA: Diagnosis not present

## 2022-09-06 DIAGNOSIS — E119 Type 2 diabetes mellitus without complications: Secondary | ICD-10-CM

## 2022-09-06 DIAGNOSIS — F3289 Other specified depressive episodes: Secondary | ICD-10-CM

## 2022-09-06 LAB — POCT GLYCOSYLATED HEMOGLOBIN (HGB A1C): HbA1c, POC (controlled diabetic range): 5.9 % (ref 0.0–7.0)

## 2022-09-06 NOTE — Progress Notes (Signed)
SUBJECTIVE:   CHIEF COMPLAINT / HPI:   Diabetes A1c Check : Lab Results  Component Value Date   HGBA1C 5.9 09/06/2022  Walking 3 days a week and to the gym 2 days a week. Diet: Avoiding sweets most of the days, but will indulge once a week. Backed off pasta, not a lot of breads.  Meds: None Foot Exam: normal 08/25/22 Podiatrist Urine: will check today  Leg Cramps Started having cramping in right leg, behind calf. When she stops taking statin leg cramping stops, when she restarts medicine cramping begins. Has been an issue since the spring. Doesn't feel like the sore/tired feeling in legs she had when switched statins. Has been taking Lipitor every other day for concern that it is causing her leg cramping.   Wanting Covid shot today  Depression Appreciates some low mood around this time of the year, because her daughter died on christmas eve. She has a therapist and will reach out.   HTN BP elevated today, patient reports its 120/80's at home. Patient reports taking meds and no chest pain, SOB. Also appreciates that she did not take her medicine today.  Meds: Amlodipine 10 mg   PERTINENT  PMH / PSH: HTN    OBJECTIVE:  BP (!) 147/85   Pulse 75   Wt 189 lb 6.4 oz (85.9 kg)   SpO2 98%   BMI 34.64 kg/m  Physical Exam Constitutional:      General: She is not in acute distress.    Appearance: Normal appearance. She is not ill-appearing.  Cardiovascular:     Rate and Rhythm: Normal rate and regular rhythm.     Pulses: Normal pulses.     Heart sounds: Normal heart sounds. No murmur heard.    No friction rub. No gallop.  Pulmonary:     Effort: Pulmonary effort is normal. No respiratory distress.     Breath sounds: Normal breath sounds. No stridor. No wheezing, rhonchi or rales.  Abdominal:     General: Abdomen is flat. There is no distension.     Tenderness: There is no abdominal tenderness. There is no guarding.  Neurological:     Mental Status: She is alert.   Psychiatric:        Mood and Affect: Mood normal.        Behavior: Behavior normal.        Thought Content: Thought content normal.      ASSESSMENT/PLAN:  Controlled type 2 diabetes mellitus without complication, without long-term current use of insulin (HCC) Assessment & Plan: Patient here for A1c check. Patient has diet controlled diabetes with regular workout routine and good diet. Patient had foot exam from podiatrist that was normal earlier this month. Will obtain A1c and Urine Microalbumin.  -Hgb A1c -Urine microalbumin  Orders: -     POCT glycosylated hemoglobin (Hb A1C) -     BASIC METABOLIC PANEL WITH GFR -     Microalbumin, urine -     Basic metabolic panel  Other depression Assessment & Plan: Patient feeling low/depressed around this time of year, as her daughter passed away on Christmas Eve. Patient denies any SI/HI and reports she used to talk to a therapist for this issue. Encouraged patient to reach out to therapist.  -F/u with therapist   HYPERTENSION, BENIGN SYSTEMIC Assessment & Plan: BP elevated today for visit. Patient reports she has not taken her meds today, but usually has BP around 120/60. Patient denies any CP, SOB. Will have patient f/u for  BP recheck in 1 week. BP Readings from Last 3 Encounters:  09/06/22 (!) 147/85  04/17/22 118/67  09/14/21 139/73  -BP recheck in 1 wk    Leg cramps Assessment & Plan: Patient reports leg cramping in right calf, that feels better when she stops taking her statin. Patient notes cramping is not like weakness/sore/tiredness she had from statin in the past that caused her to switch meds. Leg cramping is unilateral, and less likely to be statin myopathy, also patient on lipitor. Patient also has heavy exercise routine that may contribute to cramping in leg. Will check BMP for electrolyte abnormalities. -BMP -Stretching after exercise     No follow-ups on file. Bess Kinds, MD 09/07/2022, 8:34 AM PGY-2, [facility]

## 2022-09-06 NOTE — Patient Instructions (Signed)
It was great to see you! Thank you for allowing me to participate in your care!  I recommend that you always bring your medications to each appointment as this makes it easy to ensure we are on the correct medications and helps [place] not miss when refills are needed.  Our plans for today:  - Check up on Diabetes with A1c check Continue diet and exercise. I will call with A1c results - Depression You are experiencing some depression. I think you would do best reaching back out to you therapist -Leg Cramps Will check electrolytes today Cramping may be coming from exercise. Be sure to stretch after walking. Massage muscle as needed   We are checking some labs today, I will call you if they are abnormal will send you a MyChart message or a letter if they are normal.  If you do not hear about your labs in the next 2 weeks please let us know.  Take care and seek immediate care sooner if you develop any concerns.   Dr. Bess Kinds, MD [facility]

## 2022-09-07 ENCOUNTER — Other Ambulatory Visit: Payer: Self-pay | Admitting: Cardiovascular Disease

## 2022-09-07 DIAGNOSIS — E7800 Pure hypercholesterolemia, unspecified: Secondary | ICD-10-CM

## 2022-09-07 DIAGNOSIS — R252 Cramp and spasm: Secondary | ICD-10-CM | POA: Insufficient documentation

## 2022-09-07 DIAGNOSIS — I10 Essential (primary) hypertension: Secondary | ICD-10-CM

## 2022-09-07 NOTE — Assessment & Plan Note (Signed)
Patient here for A1c check. Patient has diet controlled diabetes with regular workout routine and good diet. Patient had foot exam from podiatrist that was normal earlier this month. Will obtain A1c and Urine Microalbumin.  -Hgb A1c -Urine microalbumin

## 2022-09-07 NOTE — Assessment & Plan Note (Signed)
Patient reports leg cramping in right calf, that feels better when she stops taking her statin. Patient notes cramping is not like weakness/sore/tiredness she had from statin in the past that caused her to switch meds. Leg cramping is unilateral, and less likely to be statin myopathy, also patient on lipitor. Patient also has heavy exercise routine that may contribute to cramping in leg. Will check BMP for electrolyte abnormalities. -BMP -Stretching after exercise

## 2022-09-07 NOTE — Assessment & Plan Note (Signed)
Patient feeling low/depressed around this time of year, as her daughter passed away on Christmas Eve. Patient denies any SI/HI and reports she used to talk to a therapist for this issue. Encouraged patient to reach out to therapist.  -F/u with therapist

## 2022-09-07 NOTE — Assessment & Plan Note (Signed)
BP elevated today for visit. Patient reports she has not taken her meds today, but usually has BP around 120/60. Patient denies any CP, SOB. Will have patient f/u for BP recheck in 1 week. BP Readings from Last 3 Encounters:  09/06/22 (!) 147/85  04/17/22 118/67  09/14/21 139/73  -BP recheck in 1 wk

## 2022-09-08 ENCOUNTER — Telehealth: Payer: Self-pay | Admitting: Cardiovascular Disease

## 2022-09-08 MED ORDER — LOSARTAN POTASSIUM 25 MG PO TABS: 25.0000 mg | ORAL_TABLET | Freq: Every day | ORAL | 0 refills | Status: DC

## 2022-09-08 NOTE — Telephone Encounter (Signed)
ENTERED REFILL TO REQUESTED PHARMACY

## 2022-09-08 NOTE — Telephone Encounter (Signed)
Called pt she states that she IS taking the losartan, she needs a refill. I do not see it on her medication list. She states her BP runs at home between 120's - 130/65-70's.  Should she continue to take the losartan?

## 2022-09-08 NOTE — Telephone Encounter (Signed)
Pt c/o medication issue:  1. Name of Medication: losartan 25 mg  2. How are you currently taking this medication (dosage and times per day)? 1 tablet daily  3. Are you having a reaction (difficulty breathing--STAT)? no  4. What is your medication issue?  Patient states the refill she requested for the medication was denied and she is not sure if she is supposed to stay on the medication. She says is is okay to leave a detailed message if she does not answer

## 2022-09-09 LAB — MICROALBUMIN, URINE: Microalbumin, Urine: 15.9 ug/mL

## 2022-09-09 LAB — BASIC METABOLIC PANEL
BUN/Creatinine Ratio: 16 (ref 12–28)
BUN: 13 mg/dL (ref 8–27)
CO2: 25 mmol/L (ref 20–29)
Calcium: 9.7 mg/dL (ref 8.7–10.3)
Chloride: 101 mmol/L (ref 96–106)
Creatinine, Ser: 0.8 mg/dL (ref 0.57–1.00)
Glucose: 111 mg/dL — ABNORMAL HIGH (ref 70–99)
Potassium: 4 mmol/L (ref 3.5–5.2)
Sodium: 140 mmol/L (ref 134–144)
eGFR: 79 mL/min/{1.73_m2} (ref >59)

## 2022-09-12 ENCOUNTER — Encounter: Payer: Self-pay | Admitting: Student

## 2022-09-12 DIAGNOSIS — E119 Type 2 diabetes mellitus without complications: Secondary | ICD-10-CM

## 2022-09-14 ENCOUNTER — Ambulatory Visit (INDEPENDENT_AMBULATORY_CARE_PROVIDER_SITE_OTHER): Payer: Medicare Other

## 2022-09-14 VITALS — BP 122/78 | HR 78

## 2022-09-14 DIAGNOSIS — Z0130 Encounter for examination of blood pressure without abnormal findings: Secondary | ICD-10-CM

## 2022-09-14 DIAGNOSIS — E119 Type 2 diabetes mellitus without complications: Secondary | ICD-10-CM | POA: Diagnosis not present

## 2022-09-14 DIAGNOSIS — Z23 Encounter for immunization: Secondary | ICD-10-CM | POA: Diagnosis not present

## 2022-09-15 NOTE — Progress Notes (Signed)
Patient presents to nurse clinic for BP check, COVID booster and urine microalbumin.   Administered COVID vaccine in RD, site unremarkable, tolerated injection well.   BP after 15 minutes of rest was 122/78, HR: 78  Previous BP on 11/29 was 147/85. Patient reports that she had not yet taken her BP medications at this visit.   Patient has been taking BP medications as prescribed.   Patient is asymptomatic.   Patient provided urine sample to the lab.   FYI to PCP.   Veronda Prude, RN

## 2022-09-17 LAB — MICROALBUMIN / CREATININE URINE RATIO
Creatinine, Urine: 9.3 mg/dL
Microalbumin, Urine: <3 ug/mL

## 2022-09-19 ENCOUNTER — Encounter: Payer: Self-pay | Admitting: Student

## 2022-10-11 ENCOUNTER — Other Ambulatory Visit: Payer: Self-pay | Admitting: Student

## 2022-10-11 DIAGNOSIS — Z1231 Encounter for screening mammogram for malignant neoplasm of breast: Secondary | ICD-10-CM

## 2022-11-01 ENCOUNTER — Ambulatory Visit: Payer: 59 | Attending: Cardiovascular Disease | Admitting: Cardiovascular Disease

## 2022-11-01 ENCOUNTER — Encounter: Payer: Self-pay | Admitting: Cardiovascular Disease

## 2022-11-01 VITALS — BP 132/86 | HR 65 | Ht 61.0 in | Wt 185.8 lb

## 2022-11-01 DIAGNOSIS — E7800 Pure hypercholesterolemia, unspecified: Secondary | ICD-10-CM

## 2022-11-01 DIAGNOSIS — I10 Essential (primary) hypertension: Secondary | ICD-10-CM

## 2022-11-01 DIAGNOSIS — E668 Other obesity: Secondary | ICD-10-CM | POA: Diagnosis not present

## 2022-11-01 DIAGNOSIS — I2510 Atherosclerotic heart disease of native coronary artery without angina pectoris: Secondary | ICD-10-CM

## 2022-11-01 DIAGNOSIS — R001 Bradycardia, unspecified: Secondary | ICD-10-CM | POA: Diagnosis not present

## 2022-11-01 NOTE — Progress Notes (Signed)
Cardiology Office Note:    Date:  11/01/2022   ID:  Robbi Garter, DOB 22-Apr-1951, MRN [id number]  PCP:  Holley Bouche, MD   [facility] Providers Cardiologist:  Sanda Klein, MD     Referring MD: Holley Bouche, MD   No chief complaint on file.   History of Present Illness:    Terri Estrada is a 72 y.o. female with a hx of coronary artery disease, hypercholesterolemia, HTN, borderline glucose elevation, moderate obesity.  She presented with acute inferior wall myocardial infarction 2001 and received a stent to the mid-distal right coronary artery.  She has not had any coronary events since.  She has preserved left ventricular systolic function (EF 60 to 65% by echocardiogram performed in September 2020).   She has had an uneventful year, heart disease point of view.  She continues to exercise regularly.  She goes to the gym 2 days a week and walks another 3 days a week for 30 minutes with her sister.  She has a resting heart rate that is often in the low 50s, but during exercise she sees her heart rate increased to 130 on her Fitbit.  She does not have any chest pain or shortness of breath with activity.  She has not had issues with lightheadedness dizziness palpitations syncope focal neurological complaints claudication or any other issues.  Metabolic control remains excellent.  Her recent hemoglobin A1c was 5.9%.  Her most recent LDL cholesterol was 48 and HDL 51.  She has normal renal function.  She remains moderately obese with a BMI around 35, but she has lost weight since last year and is well believes that her peak weight of 300 pounds which she reached several years ago.  She lives by her self and takes care of all her affairs independently.  She fiercely wants to maintain her autonomy and stay healthy and fit.  Past Medical History:  Diagnosis Date   Arthritis    CAD in native artery    a. MI 2001 s/p PTCA/stenting to mid-distal junction of RCA - residual dx  treated medically.   Depression    pt reports having it intermittently   Diverticulosis of colon (without mention of hemorrhage)    Essential hypertension, benign    MI (myocardial infarction) ([place])    Mild pulmonary hypertension (HCC)    Obesity, unspecified    Onychia and paronychia of toe    Osteoarthrosis, unspecified whether generalized or localized, unspecified site    Other and unspecified hyperlipidemia    PONV (postoperative nausea and vomiting)    Sickle-cell trait (HCC)    Sinus bradycardia    a. HR 40s even on low dose metoprolol - BB stopped with resolution.   Streptococcal sore throat    Type II or unspecified type diabetes mellitus without mention of complication, not stated as uncontrolled     Past Surgical History:  Procedure Laterality Date   ANGIOPLASTY  09/21/00   CARDIAC CATHETERIZATION     JOINT REPLACEMENT     TOTAL KNEE ARTHROPLASTY Right 02/23/2020   Procedure: RIGHT TOTAL KNEE ARTHROPLASTY;  Surgeon: Leandrew Koyanagi, MD;  Location: [place];  Service: Orthopedics;  Laterality: Right;   TOTAL KNEE ARTHROPLASTY Left 12/27/2020   Procedure: LEFT TOTAL KNEE ARTHROPLASTY;  Surgeon: Leandrew Koyanagi, MD;  Location: [place];  Service: Orthopedics;  Laterality: Left;    Current Medications: Current Meds  Medication Sig   acetaminophen (TYLENOL) 650 MG CR tablet Take 1,300 mg by mouth  in the morning and at bedtime.   amLODipine (NORVASC) 10 MG tablet TAKE ONE TABLET BY MOUTH DAILY   aspirin EC 81 MG tablet Take 1 tablet (81 mg total) by mouth daily.   atorvastatin (LIPITOR) 80 MG tablet TAKE ONE TABLET BY MOUTH DAILY   calcium carbonate (OSCAL) 1500 (600 Ca) MG TABS tablet Take 600 mg by mouth 2 (two) times daily.   carboxymethylcellulose (REFRESH PLUS) 0.5 % SOLN Place 1 drop into both eyes 3 (three) times daily as needed (dry/irritated eyes.).   cetirizine (ZYRTEC) 10 MG tablet TAKE ONE TABLET BY MOUTH DAILY   Cholecalciferol 25 MCG (1000 UT) tablet Take 1,000 Units by  mouth daily.   Cinnamon 500 MG TABS Take 500 mg by mouth daily.   Coenzyme Q10 (COQ10) 100 MG CAPS Take 100 mg by mouth in the morning and at bedtime.   ezetimibe (ZETIA) 10 MG tablet TAKE ONE TABLET BY MOUTH DAILY   hydrochlorothiazide (HYDRODIURIL) 25 MG tablet Take 1 tablet (25 mg total) by mouth daily.   isosorbide mononitrate (IMDUR) 30 MG 24 hr tablet TAKE ONE TABLET BY MOUTH DAILY   losartan (COZAAR) 25 MG tablet Take 1 tablet (25 mg total) by mouth daily.   methocarbamol (ROBAXIN) 500 MG tablet Take 1 tablet (500 mg total) by mouth 2 (two) times daily as needed.   methocarbamol (ROBAXIN) 500 MG tablet Take 1 tablet (500 mg total) by mouth 2 (two) times daily as needed for muscle spasms.   Multiple Vitamins-Minerals (MULTIVITAMINS THER. W/MINERALS) TABS Take 1 tablet by mouth every evening.   Probiotic Product (PROBIOTIC DAILY) CAPS Take 1 capsule by mouth every evening.   traMADol (ULTRAM) 50 MG tablet Take 1 tablet (50 mg total) by mouth 2 (two) times daily as needed.     Allergies:   Ace inhibitors, Shellfish allergy, and Zocor [simvastatin]   Social History   Socioeconomic History   Marital status: Legally Separated    Spouse name: Not on file   Number of children: 2   Years of education: Not on file   Highest education level: Not on file  Occupational History   Occupation: DAY CARE    Employer: SELF-EMPLOYED  Tobacco Use   Smoking status: Former   Smokeless tobacco: Never   Tobacco comments:    quit nov 2001  Vaping Use   Vaping Use: Never used  Substance and Sexual Activity   Alcohol use: No   Drug use: No   Sexual activity: Yes    Birth control/protection: Post-menopausal  Other Topics Concern   Not on file  Social History Narrative   Not on file   Social Determinants of Health   Financial Resource Strain: Not on file  Food Insecurity: No Food Insecurity (09/21/2021)   Hunger Vital Sign    Worried About Running Out of Food in the Last Year: Never true     Ran Out of Food in the Last Year: Never true  Transportation Needs: No Transportation Needs (09/21/2021)   PRAPARE - Hydrologist (Medical): No    Lack of Transportation (Non-Medical): No  Physical Activity: Not on file  Stress: Stress Concern Present (09/21/2021)   [place]    Feeling of Stress : Rather much  Social Connections: Not on file     Family History: The patient's family history includes Breast cancer in her sister; Cancer in an other family member; Diabetes in an other family  member; Diabetes type II in her mother and sister; Hypertension in her mother; Sickle cell anemia in her daughter; Stroke in an other family member.  ROS:   Please see the history of present illness.     All other systems reviewed and are negative.  EKGs/Labs/Other Studies Reviewed:    The following studies were reviewed today: Echocardiogram 2020   1. Left ventricular ejection fraction, by visual estimation, is 60 to  65%. The left ventricle has normal function. Normal left ventricular size.  There is mildly increased left ventricular hypertrophy.   2. Left ventricular diastolic Doppler parameters are consistent with  impaired relaxation pattern of LV diastolic filling.   3. Global longitudinal strain= -23%.   4. Global right ventricle has normal systolic function.The right  ventricular size is normal. No increase in right ventricular wall  thickness.   EKG:  EKG is  ordered today.  It shows normal sinus rhythm, possible left atrial abnormality, Q waves of old inferior infarction without any acute repolarization abnormalities, QTc 443 ms.  Unchanged from previous tracings.   Recent Labs: 09/06/2022: BUN 13; Creatinine, Ser 0.80; Potassium 4.0; Sodium 140  Recent Lipid Panel    Component Value Date/Time   CHOL 112 04/17/2022 1007   TRIG 55 04/17/2022 1007   HDL 51 04/17/2022 1007   CHOLHDL 2.2  04/17/2022 1007   CHOLHDL 2.4 03/26/2015 1033   VLDL 14 03/26/2015 1033   LDLCALC 48 04/17/2022 1007   LDLDIRECT 63 03/15/2012 1520     Risk Assessment/Calculations:           Physical Exam:    VS:  BP 132/86   Pulse 65   Ht 5\' 1"  (1.549 m)   Wt 185 lb 12.8 oz (84.3 kg)   SpO2 97%   BMI 35.11 kg/m     Wt Readings from Last 3 Encounters:  11/01/22 185 lb 12.8 oz (84.3 kg)  09/06/22 189 lb 6.4 oz (85.9 kg)  04/17/22 192 lb (87.1 kg)      General: Alert, oriented x3, no distress, Moderately obese Head: no evidence of trauma, PERRL, EOMI, no exophtalmos or lid lag, no myxedema, no xanthelasma; normal ears, nose and oropharynx Neck: normal jugular venous pulsations and no hepatojugular reflux; brisk carotid pulses without delay and no carotid bruits Chest: clear to auscultation, no signs of consolidation by percussion or palpation, normal fremitus, symmetrical and full respiratory excursions Cardiovascular: normal position and quality of the apical impulse, regular rhythm, normal first and second heart sounds, short systolic ejection murmur heard up and down the left sternal border, no diastolic murmurs, rubs or gallops Abdomen: no tenderness or distention, no masses by palpation, no abnormal pulsatility or arterial bruits, normal bowel sounds, no hepatosplenomegaly Extremities: no clubbing, cyanosis or edema; 2+ radial, ulnar and brachial pulses bilaterally; 2+ right femoral, posterior tibial and dorsalis pedis pulses; 2+ left femoral, posterior tibial and dorsalis pedis pulses; no subclavian or femoral bruits Neurological: grossly nonfocal Psych: Normal mood and affect   ASSESSMENT:    1. Coronary artery disease involving native coronary artery of native heart without angina pectoris   2. Pure hypercholesterolemia   3. Moderate obesity   4. Essential hypertension   5. Sinus bradycardia     PLAN:    In order of problems listed above:  CAD: She remains completely  asymptomatic although she is very active.  Option to stop the isosorbide mononitrate permanently.  Not on a beta-blocker due to resting bradycardia.  On aspirin and highly  effective lipid-lowering therapy.   Hypercholesterolemia: All lipid parameters are in target range on combination of atorvastatin and ezetimibe.  She complains about the awful taste of the atorvastatin tablet, but is compliant with it. Obesity: Hemoglobin A1c in prediabetes range just slightly worse than last year.  She has lost some weight. HTN: Had a controlled.  Diastolic blood pressure little high today, but just last month her blood pressure is 122/78 at an office visit. Sinus bradycardia:  her heart rate is not as slow today and she does not have any symptoms of bradycardia.  Avoid beta-blockers and other medications with negative chronotropic effect.           Medication Adjustments/Labs and Tests Ordered: Current medicines are reviewed at length with the patient today.  Concerns regarding medicines are outlined above.  Orders Placed This Encounter  Procedures   EKG 12-Lead    No orders of the defined types were placed in this encounter.  Patient Instructions  Medication Instructions:  Your physician recommends that you continue on your current medications as directed. Please refer to the Current Medication list given to you today.  *If you need a refill on your cardiac medications before your next appointment, please call your pharmacy*  Follow-Up: At [facility], you and your health needs are our priority.  As part of our continuing mission to provide you with exceptional heart care, we have created designated Provider Care Teams.  These Care Teams include your primary Cardiologist (physician) and Advanced Practice Providers (APPs -  Physician Assistants and Nurse Practitioners) who all work together to provide you with the care you need, when you need it.  We recommend signing up for the patient  portal called "MyChart".  Sign up information is provided on this After Visit Summary.  MyChart is used to connect with patients for Virtual Visits (Telemedicine).  Patients are able to view lab/test results, encounter notes, upcoming appointments, etc.  Non-urgent messages can be sent to your provider as well.   To learn more about what you can do with MyChart, go to [email or web address].    Your next appointment:   12 month(s)  Provider:   Thurmon Fair, MD

## 2022-11-01 NOTE — Patient Instructions (Signed)
Medication Instructions:  Your physician recommends that you continue on your current medications as directed. Please refer to the Current Medication list given to you today.  *If you need a refill on your cardiac medications before your next appointment, please call your pharmacy*  Follow-Up: At [facility], you and your health needs are our priority.  As part of our continuing mission to provide you with exceptional heart care, we have created designated Provider Care Teams.  These Care Teams include your primary Cardiologist (physician) and Advanced Practice Providers (APPs -  Physician Assistants and Nurse Practitioners) who all work together to provide you with the care you need, when you need it.  We recommend signing up for the patient portal called "MyChart".  Sign up information is provided on this After Visit Summary.  MyChart is used to connect with patients for Virtual Visits (Telemedicine).  Patients are able to view lab/test results, encounter notes, upcoming appointments, etc.  Non-urgent messages can be sent to your provider as well.   To learn more about what you can do with MyChart, go to [email or web address].    Your next appointment:   12 month(s)  Provider:   Mihai Croitoru, MD       

## 2022-11-08 ENCOUNTER — Telehealth: Payer: Self-pay | Admitting: Orthopaedic Surgery

## 2022-11-08 ENCOUNTER — Other Ambulatory Visit: Payer: Self-pay | Admitting: Physician Assistant

## 2022-11-08 MED ORDER — METHOCARBAMOL 500 MG PO TABS: 500.0000 mg | ORAL_TABLET | Freq: Two times a day (BID) | ORAL | 0 refills | Status: DC | PRN

## 2022-11-08 NOTE — Telephone Encounter (Signed)
Pt called requesting refill of muscle relaxer. Please send to pharmacy on file. Pt phone number is [phone]

## 2022-11-08 NOTE — Telephone Encounter (Signed)
Sent in

## 2022-11-15 ENCOUNTER — Other Ambulatory Visit: Payer: Self-pay | Admitting: Cardiovascular Disease

## 2022-11-15 DIAGNOSIS — E7800 Pure hypercholesterolemia, unspecified: Secondary | ICD-10-CM

## 2022-11-15 DIAGNOSIS — I10 Essential (primary) hypertension: Secondary | ICD-10-CM

## 2022-12-04 ENCOUNTER — Other Ambulatory Visit: Payer: Self-pay | Admitting: Cardiovascular Disease

## 2022-12-18 ENCOUNTER — Ambulatory Visit
Admission: RE | Admit: 2022-12-18 | Discharge: 2022-12-18 | Disposition: A | Discharge status: Home / Self Care | Payer: 59 | Source: Ambulatory Visit | Attending: Family Medicine | Admitting: Family Medicine

## 2022-12-18 DIAGNOSIS — Z1231 Encounter for screening mammogram for malignant neoplasm of breast: Secondary | ICD-10-CM | POA: Diagnosis not present

## 2023-01-10 DIAGNOSIS — Z4689 Encounter for fitting and adjustment of other specified devices: Secondary | ICD-10-CM | POA: Diagnosis not present

## 2023-01-11 ENCOUNTER — Telehealth: Payer: Self-pay | Admitting: Cardiovascular Disease

## 2023-01-11 NOTE — Telephone Encounter (Signed)
Spoke with [place] care regarding the two medications Atorvastatin and Zetia.  She states patient had complained of leg pain, do see this was noted  in the past per last visits,  Patient has no increased pain and not asking for change.  The NP that sees her once a year wants something changed since she is complaining of pain.  She goes on for about 10 minute that she doesn't know her but she thinks something she should be changed. Advised I would send to the physician for review

## 2023-01-11 NOTE — Telephone Encounter (Signed)
Pt c/o medication issue:  1. Name of Medication:  atorvastatin (LIPITOR) 80 MG tablet  ezetimibe (ZETIA) 10 MG tablet   2. How are you currently taking this medication (dosage and times per day)?  TAKE ONE TABLET BY MOUTH DAILY TAKE ONE TABLET BY MOUTH DAILY     3. Are you having a reaction (difficulty breathing--STAT)? no  4. What is your medication issue? Patient called wanting to know why she is on both these medications. She states one was giving to her by a PA.

## 2023-01-12 NOTE — Telephone Encounter (Signed)
Call to patient to discuss the medication.  She has an appt. With her PCP on 5/2 and she will discuss the medication with them and then when she follows up with our office if she needs to. Nothing further needed at this time.

## 2023-01-12 NOTE — Telephone Encounter (Signed)
She has been on these meds for years and exercises daily without any complaints at her office visit 2 months ago. I am sure the patient can reach out to [place] herself if the symptoms truly bother her.

## 2023-02-08 ENCOUNTER — Ambulatory Visit (INDEPENDENT_AMBULATORY_CARE_PROVIDER_SITE_OTHER): Payer: 59 | Admitting: Student

## 2023-02-08 ENCOUNTER — Encounter: Payer: Self-pay | Admitting: Student

## 2023-02-08 ENCOUNTER — Other Ambulatory Visit: Payer: Self-pay

## 2023-02-08 VITALS — BP 132/79 | HR 75 | Ht 62.0 in | Wt 186.6 lb

## 2023-02-08 DIAGNOSIS — E119 Type 2 diabetes mellitus without complications: Secondary | ICD-10-CM | POA: Diagnosis not present

## 2023-02-08 DIAGNOSIS — I10 Essential (primary) hypertension: Secondary | ICD-10-CM | POA: Diagnosis not present

## 2023-02-08 DIAGNOSIS — E669 Obesity, unspecified: Secondary | ICD-10-CM | POA: Diagnosis not present

## 2023-02-08 DIAGNOSIS — E7800 Pure hypercholesterolemia, unspecified: Secondary | ICD-10-CM | POA: Diagnosis not present

## 2023-02-08 LAB — POCT GLYCOSYLATED HEMOGLOBIN (HGB A1C): HbA1c, POC (controlled diabetic range): 5.9 % (ref 0.0–7.0)

## 2023-02-08 NOTE — Progress Notes (Signed)
  SUBJECTIVE:   CHIEF COMPLAINT / HPI:   HTN Meds: Amlodipine 10 mg, HCTZ 25 mg, Losartan 25 mg BP Readings from Last 3 Encounters:  02/08/23 132/79  11/01/22 132/86  09/14/22 122/78  Taking the meds regularly. BP well controlled today.   DM Diet controlled Lab Results  Component Value Date   HGBA1C 5.9 02/08/2023  Meds: none Spoke to patient about considering Ozempic for weight loss w/ diabetes   HLD Lab Results  Component Value Date   CHOL 112 04/17/2022   HDL 51 04/17/2022   LDLCALC 48 04/17/2022   LDLDIRECT 63 03/15/2012   TRIG 55 04/17/2022   CHOLHDL 2.2 04/17/2022  Meds: Lipitor 80 mg  Leg pain Has been improved since she started taking the [place]unut water 2 months ago. She now is believing it may not be coming from her statin.    PERTINENT  PMH / PSH: HTN, DM, HLD  Patient Care Team: Bess Kinds, MD as PCP - General (Family Medicine) Croitoru, Rachelle Hora, MD as PCP - Cardiology (Cardiology) OBJECTIVE:  BP 132/79   Pulse 75   Ht 5\' 2"  (1.575 m)   Wt 186 lb 9.6 oz (84.6 kg)   SpO2 100%   BMI 34.13 kg/m  Physical Exam Constitutional:      General: She is not in acute distress.    Appearance: Normal appearance. She is not ill-appearing.  Cardiovascular:     Rate and Rhythm: Normal rate and regular rhythm.     Pulses: Normal pulses.     Heart sounds: Normal heart sounds. No murmur heard.    No friction rub. No gallop.  Pulmonary:     Effort: Pulmonary effort is normal. No respiratory distress.     Breath sounds: Normal breath sounds. No stridor. No wheezing, rhonchi or rales.  Abdominal:     General: There is no distension.     Palpations: Abdomen is soft.     Tenderness: There is no abdominal tenderness.  Neurological:     Mental Status: She is alert.  Psychiatric:        Mood and Affect: Mood normal.        Behavior: Behavior normal.      ASSESSMENT/PLAN:  Controlled type 2 diabetes mellitus without complication, without long-term current use  of insulin (HCC) Assessment & Plan: A1c stable today at 5.9. Patient reports regular exercise. Spoke to patient about considering starting ozempic for weight loss w/ DM control. Patient considering.  -Consider Ozempic -F/u 6 months  Orders: -     POCT glycosylated hemoglobin (Hb A1C)  HYPERTENSION, BENIGN SYSTEMIC Assessment & Plan: BP at goal and well controlled. Reports good compliance w/ meds. -Continue Amlodipine 10 mg, HCTZ 25 mg, Losartan 25 mg -BMP  Orders: -     Basic metabolic panel  Pure hypercholesterolemia Assessment & Plan: Patient reports compliance w/ statin, and also does not believe is causing her unilateral right sided leg cramps.  -Lipid panel -Continue Lipitor 80 mg  Orders: -     Lipid panel  Obesity (BMI 30-39.9) Assessment & Plan: Patient notes she's been exercising regularly and losing weight. Discussed starting ozempic with patient given hx of DM and desiring weight loss. Patient will consider. -Consider start ozempic    No follow-ups on file. Bess Kinds, MD 02/08/2023, 10:03 PM PGY-2, [facility]

## 2023-02-08 NOTE — Assessment & Plan Note (Signed)
Patient notes she's been exercising regularly and losing weight. Discussed starting ozempic with patient given hx of DM and desiring weight loss. Patient will consider. -Consider start ozempic

## 2023-02-08 NOTE — Patient Instructions (Signed)
It was great to see you! Thank you for allowing me to participate in your care!  I recommend that you always bring your medications to each appointment as this makes it easy to ensure we are on the correct medications and helps [place] not miss when refills are needed.  Our plans for today:  - Diabetes  Your diabetes is well controlled! Good work!   We will recheck your A1c in 6 months\  *Consider the medication Ozempic, for help with weight loss* We will check your urine/kidney function at next visit (be sure not to drink too much water)  - High Blood Pressure  Blood pressure looks good! Continue taking your meds!  We will check a BMP today to look at electrolytes and kidney function  - High Cholesterol  Checking your cholesterol today  We are checking some labs today, I will call you if they are abnormal will send you a MyChart message or a letter if they are normal.  If you do not hear about your labs in the next 2 weeks please let us know.  Take care and seek immediate care sooner if you develop any concerns.   Dr. Bess Kinds, MD [facility]

## 2023-02-08 NOTE — Assessment & Plan Note (Signed)
A1c stable today at 5.9. Patient reports regular exercise. Spoke to patient about considering starting ozempic for weight loss w/ DM control. Patient considering.  -Consider Ozempic -F/u 6 months

## 2023-02-08 NOTE — Assessment & Plan Note (Signed)
BP at goal and well controlled. Reports good compliance w/ meds. -Continue Amlodipine 10 mg, HCTZ 25 mg, Losartan 25 mg -BMP

## 2023-02-08 NOTE — Assessment & Plan Note (Signed)
Patient reports compliance w/ statin, and also does not believe is causing her unilateral right sided leg cramps.  -Lipid panel -Continue Lipitor 80 mg

## 2023-02-09 LAB — LIPID PANEL
Chol/HDL Ratio: 2.5 ratio (ref 0.0–4.4)
Cholesterol, Total: 168 mg/dL (ref 100–199)
HDL: 68 mg/dL (ref >39)
LDL Chol Calc (NIH): 85 mg/dL (ref 0–99)
Triglycerides: 83 mg/dL (ref 0–149)
VLDL Cholesterol Cal: 15 mg/dL (ref 5–40)

## 2023-02-09 LAB — BASIC METABOLIC PANEL
BUN/Creatinine Ratio: 18 (ref 12–28)
BUN: 14 mg/dL (ref 8–27)
CO2: 27 mmol/L (ref 20–29)
Calcium: 9.9 mg/dL (ref 8.7–10.3)
Chloride: 100 mmol/L (ref 96–106)
Creatinine, Ser: 0.78 mg/dL (ref 0.57–1.00)
Glucose: 103 mg/dL — ABNORMAL HIGH (ref 70–99)
Potassium: 4.1 mmol/L (ref 3.5–5.2)
Sodium: 140 mmol/L (ref 134–144)
eGFR: 82 mL/min/{1.73_m2} (ref >59)

## 2023-02-14 ENCOUNTER — Encounter: Payer: Self-pay | Admitting: Student

## 2023-03-15 ENCOUNTER — Ambulatory Visit: Payer: 59 | Admitting: Orthopaedic Surgery

## 2023-03-20 ENCOUNTER — Other Ambulatory Visit (INDEPENDENT_AMBULATORY_CARE_PROVIDER_SITE_OTHER): Payer: 59

## 2023-03-20 ENCOUNTER — Ambulatory Visit (INDEPENDENT_AMBULATORY_CARE_PROVIDER_SITE_OTHER): Payer: 59 | Admitting: Orthopaedic Surgery

## 2023-03-20 DIAGNOSIS — Z96651 Presence of right artificial knee joint: Secondary | ICD-10-CM

## 2023-03-20 DIAGNOSIS — Z96652 Presence of left artificial knee joint: Secondary | ICD-10-CM

## 2023-03-20 MED ORDER — TRAMADOL HCL 50 MG PO TABS: 50.0000 mg | ORAL_TABLET | Freq: Two times a day (BID) | ORAL | 2 refills | Status: DC | PRN

## 2023-03-20 MED ORDER — METHOCARBAMOL 500 MG PO TABS: 500.0000 mg | ORAL_TABLET | Freq: Two times a day (BID) | ORAL | 2 refills | Status: DC | PRN

## 2023-03-20 NOTE — Progress Notes (Signed)
Office Visit Note   Patient: Terri Estrada           Date of Birth: Apr 25, 1951           MRN: [id number] Visit Date: 03/20/2023              Requested by: Ronnald Ramp, MD [address] [place],  [place] [address] PCP: Bess Kinds, MD   Assessment & Plan: Visit Diagnoses:  1. Status post total left knee replacement   2. Status post total right knee replacement     Plan: Terri Estrada is now 2 years status post left total knee replacement in 3 years status post right total knee replacement.  Overall she is doing very well and very satisfied with the outcome.  She is functional with all ADLs.  At this point she will follow-up with [place] as needed.  Follow-Up Instructions: No follow-ups on file.   Orders:  Orders Placed This Encounter  Procedures   XR Knee 1-2 Views Left   XR Knee 1-2 Views Right   Meds ordered this encounter  Medications   methocarbamol (ROBAXIN) 500 MG tablet    Sig: Take 1 tablet (500 mg total) by mouth 2 (two) times daily as needed for muscle spasms.    Dispense:  30 tablet    Refill:  2   traMADol (ULTRAM) 50 MG tablet    Sig: Take 1 tablet (50 mg total) by mouth 2 (two) times daily as needed.    Dispense:  60 tablet    Refill:  2      Procedures: No procedures performed   Clinical Data: No additional findings.   Subjective: Chief Complaint  Patient presents with   Right Knee - Follow-up    02/23/2020 Right TKA   Left Knee - Follow-up    12/27/2020 Left TKA    HPI Terri Estrada is here for her 2-year postop check on the left knee and 3-year postop check for the right knee. Review of Systems   Objective: Vital Signs: There were no vitals taken for this visit.  Physical Exam  Ortho Exam Examination bilateral knees show fully healed surgical scar.  Excellent range of motion.  Collaterals are stable. Specialty Comments:  No specialty comments available.  Imaging: XR Knee 1-2 Views Right  Result Date:  03/20/2023 2 view x-rays of the right knee shows stable implants without any evidence of subsidence or loosening.  XR Knee 1-2 Views Left  Result Date: 03/20/2023 2 view x-rays of the left knee show stable total knee implants without any evidence of subsidence or loosening.    PMFS History: Patient Active Problem List   Diagnosis Date Noted   Leg cramps 09/07/2022   Grief at loss of child 09/15/2021   Status post total left knee replacement 12/27/2020   Primary osteoarthritis of left knee 12/26/2020   Fatigue 06/18/2020   Status post total right knee replacement 02/23/2020   Acute seasonal allergic rhinitis 12/19/2019   Pre-op evaluation 10/23/2019   Murmur 12/10/2015   Pes planus of both feet 01/07/2015   Depression 01/07/2015   Female cystocele 06/26/2014   Healthcare maintenance 12/17/2012   Osteoarthritis 06/21/2010   Hyperlipidemia 05/31/2009   DIVERTICULOSIS, COLON 10/16/2008   AORTIC STENOSIS, MILD 06/02/2008   Diabetes mellitus type 2, controlled, without complications (HCC) 12/06/2006   Obesity (BMI 30-39.9) 12/06/2006   SICKLE CELL TRAIT 12/06/2006   HYPERTENSION, BENIGN SYSTEMIC 12/06/2006   CORONARY, ARTERIOSCLEROSIS 12/06/2006   Past Medical History:  Diagnosis  Date   Arthritis    CAD in native artery    a. MI 2001 s/p PTCA/stenting to mid-distal junction of RCA - residual dx treated medically.   Depression    pt reports having it intermittently   Diverticulosis of colon (without mention of hemorrhage)    Essential hypertension, benign    MI (myocardial infarction) (HCC)    Mild pulmonary hypertension (HCC)    Obesity, unspecified    Onychia and paronychia of toe    Osteoarthrosis, unspecified whether generalized or localized, unspecified site    Other and unspecified hyperlipidemia    PONV (postoperative nausea and vomiting)    Sickle-cell trait (HCC)    Sinus bradycardia    a. HR 40s even on low dose metoprolol - BB stopped with resolution.    Streptococcal sore throat    Type II or unspecified type diabetes mellitus without mention of complication, not stated as uncontrolled     Family History  Problem Relation Age of Onset   Hypertension Mother    Diabetes type II Mother    Diabetes type II Sister    Breast cancer Sister    Breast cancer Sister    Sickle cell anemia Daughter    Cancer Other    Stroke Other    Diabetes Other     Past Surgical History:  Procedure Laterality Date   ANGIOPLASTY  09/21/00   CARDIAC CATHETERIZATION     JOINT REPLACEMENT     TOTAL KNEE ARTHROPLASTY Right 02/23/2020   Procedure: RIGHT TOTAL KNEE ARTHROPLASTY;  Surgeon: Tarry Kos, MD;  Location: [facility] OR;  Service: Orthopedics;  Laterality: Right;   TOTAL KNEE ARTHROPLASTY Left 12/27/2020   Procedure: LEFT TOTAL KNEE ARTHROPLASTY;  Surgeon: Tarry Kos, MD;  Location: [facility] OR;  Service: Orthopedics;  Laterality: Left;   Social History   Occupational History   Occupation: DAY Associate Professor: SELF-EMPLOYED  Tobacco Use   Smoking status: Former   Smokeless tobacco: Never   Tobacco comments:    quit nov 2001  Vaping Use   Vaping Use: Never used  Substance and Sexual Activity   Alcohol use: No   Drug use: No   Sexual activity: Yes    Birth control/protection: Post-menopausal

## 2023-04-10 ENCOUNTER — Ambulatory Visit (INDEPENDENT_AMBULATORY_CARE_PROVIDER_SITE_OTHER): Payer: 59 | Admitting: *Deleted

## 2023-04-10 DIAGNOSIS — Z0000 Encounter for general adult medical examination without abnormal findings: Secondary | ICD-10-CM | POA: Diagnosis not present

## 2023-04-10 NOTE — Patient Instructions (Signed)
[name] , Thank you for taking time to come for your Medicare Wellness Visit. I appreciate your ongoing commitment to your health goals. Please review the following plan we discussed and let me know if I can assist you in the future.   Screening recommendations/referrals: Colonoscopy:  up to date Recommended yearly ophthalmology/optometry visit for glaucoma screening and checkup Recommended yearly dental visit for hygiene and checkup  Vaccinations: Influenza vaccine: up to date Pneumococcal vaccine: up to date Tdap vaccine: Education provided Shingles vaccine: up to date    Advanced directives: Education provided      Preventive Care 65 Years and Older, Female Preventive care refers to lifestyle choices and visits with your health care provider that can promote health and wellness. What does preventive care include? A yearly physical exam. This is also called an annual well check. Dental exams once or twice a year. Routine eye exams. Ask your health care provider how often you should have your eyes checked. Personal lifestyle choices, including: Daily care of your teeth and gums. Regular physical activity. Eating a healthy diet. Avoiding tobacco and drug use. Limiting alcohol use. Practicing safe sex. Taking low doses of aspirin every day. Taking vitamin and mineral supplements as recommended by your health care provider. What happens during an annual well check? The services and screenings done by your health care provider during your annual well check will depend on your age, overall health, lifestyle risk factors, and family history of disease. Counseling  Your health care provider may ask you questions about your: Alcohol use. Tobacco use. Drug use. Emotional well-being. Home and relationship well-being. Sexual activity. Eating habits. History of falls. Memory and ability to understand (cognition). Work and work Astronomer. Screening  You may have the following  tests or measurements: Height, weight, and BMI. Blood pressure. Lipid and cholesterol levels. These may be checked every 5 years, or more frequently if you are over 71 years old. Skin check. Lung cancer screening. You may have this screening every year starting at age 76 if you have a 30-pack-year history of smoking and currently smoke or have quit within the past 15 years. Fecal occult blood test (FOBT) of the stool. You may have this test every year starting at age 39. Flexible sigmoidoscopy or colonoscopy. You may have a sigmoidoscopy every 5 years or a colonoscopy every 10 years starting at age 54. Prostate cancer screening. Recommendations will vary depending on your family history and other risks. Hepatitis C blood test. Hepatitis B blood test. Sexually transmitted disease (STD) testing. Diabetes screening. This is done by checking your blood sugar (glucose) after you have not eaten for a while (fasting). You may have this done every 1-3 years. Abdominal aortic aneurysm (AAA) screening. You may need this if you are a current or former smoker. Osteoporosis. You may be screened starting at age 16 if you are at high risk. Talk with your health care provider about your test results, treatment options, and if necessary, the need for more tests. Vaccines  Your health care provider may recommend certain vaccines, such as: Influenza vaccine. This is recommended every year. Tetanus, diphtheria, and acellular pertussis (Tdap, Td) vaccine. You may need a Td booster every 10 years. Zoster vaccine. You may need this after age 54. Pneumococcal 13-valent conjugate (PCV13) vaccine. One dose is recommended after age 87. Pneumococcal polysaccharide (PPSV23) vaccine. One dose is recommended after age 66. Talk to your health care provider about which screenings and vaccines you need and how often you  need them. This information is not intended to replace advice given to you by your health care provider.  Make sure you discuss any questions you have with your health care provider. Document Released: 10/22/2015 Document Revised: 06/14/2016 Document Reviewed: 07/27/2015 Elsevier Interactive Patient Education  2017 [place] Prevention in the Home Falls can cause injuries. They can happen to people of all ages. There are many things you can do to make your home safe and to help prevent falls. What can I do on the outside of my home? Regularly fix the edges of walkways and driveways and fix any cracks. Remove anything that might make you trip as you walk through a door, such as a raised step or threshold. Trim any bushes or trees on the path to your home. Use bright outdoor lighting. Clear any walking paths of anything that might make someone trip, such as rocks or tools. Regularly check to see if handrails are loose or broken. Make sure that both sides of any steps have handrails. Any raised decks and porches should have guardrails on the edges. Have any leaves, snow, or ice cleared regularly. Use sand or salt on walking paths during winter. Clean up any spills in your garage right away. This includes oil or grease spills. What can I do in the bathroom? Use night lights. Install grab bars by the toilet and in the tub and shower. Do not use towel bars as grab bars. Use non-skid mats or decals in the tub or shower. If you need to sit down in the shower, use a plastic, non-slip stool. Keep the floor dry. Clean up any water that spills on the floor as soon as it happens. Remove soap buildup in the tub or shower regularly. Attach bath mats securely with double-sided non-slip rug tape. Do not have throw rugs and other things on the floor that can make you trip. What can I do in the bedroom? Use night lights. Make sure that you have a light by your bed that is easy to reach. Do not use any sheets or blankets that are too big for your bed. They should not hang down onto the floor. Have a  firm chair that has side arms. You can use this for support while you get dressed. Do not have throw rugs and other things on the floor that can make you trip. What can I do in the kitchen? Clean up any spills right away. Avoid walking on wet floors. Keep items that you use a lot in easy-to-reach places. If you need to reach something above you, use a strong step stool that has a grab bar. Keep electrical cords out of the way. Do not use floor polish or wax that makes floors slippery. If you must use wax, use non-skid floor wax. Do not have throw rugs and other things on the floor that can make you trip. What can I do with my stairs? Do not leave any items on the stairs. Make sure that there are handrails on both sides of the stairs and use them. Fix handrails that are broken or loose. Make sure that handrails are as long as the stairways. Check any carpeting to make sure that it is firmly attached to the stairs. Fix any carpet that is loose or worn. Avoid having throw rugs at the top or bottom of the stairs. If you do have throw rugs, attach them to the floor with carpet tape. Make sure that you have a light switch  at the top of the stairs and the bottom of the stairs. If you do not have them, ask someone to add them for you. What else can I do to help prevent falls? Wear shoes that: Do not have high heels. Have rubber bottoms. Are comfortable and fit you well. Are closed at the toe. Do not wear sandals. If you use a stepladder: Make sure that it is fully opened. Do not climb a closed stepladder. Make sure that both sides of the stepladder are locked into place. Ask someone to hold it for you, if possible. Clearly mark and make sure that you can see: Any grab bars or handrails. First and last steps. Where the edge of each step is. Use tools that help you move around (mobility aids) if they are needed. These include: Canes. Walkers. Scooters. Crutches. Turn on the lights when you  go into a dark area. Replace any light bulbs as soon as they burn out. Set up your furniture so you have a clear path. Avoid moving your furniture around. If any of your floors are uneven, fix them. If there are any pets around you, be aware of where they are. Review your medicines with your doctor. Some medicines can make you feel dizzy. This can increase your chance of falling. Ask your doctor what other things that you can do to help prevent falls. This information is not intended to replace advice given to you by your health care provider. Make sure you discuss any questions you have with your health care provider. Document Released: 07/22/2009 Document Revised: 03/02/2016 Document Reviewed: 10/30/2014 Elsevier Interactive Patient Education  2017 [employer].

## 2023-04-10 NOTE — Progress Notes (Signed)
Subjective:   Terri Estrada is a 72 y.o. female who presents for an Initial Medicare Annual Wellness Visit.  Visit Complete: Virtual  I connected with  Terri Estrada on 04/10/23 by a audio enabled telemedicine application and verified that I am speaking with the correct person using two identifiers.  Patient Location: Home  Provider Location: Home Office  I discussed the limitations of evaluation and management by telemedicine. The patient expressed understanding and agreed to proceed.  Patient Medicare AWV questionnaire was completed by the patient on 04-06-2023; I have confirmed that all information answered by patient is correct and no changes since this date.  Review of Systems     Cardiac Risk Factors include: advanced age (>35men, >34 women);hypertension     Objective:    Today's Vitals   There is no height or weight on file to calculate BMI.     04/10/2023   11:37 AM 02/08/2023   10:53 AM 04/17/2022    8:26 AM 08/31/2021    1:35 PM 04/29/2021    9:44 AM 01/25/2021   11:57 AM 12/27/2020   11:00 AM  Advanced Directives  Does Patient Have a Medical Advance Directive? No No No No No No No  Would patient like information on creating a medical advance directive? No - Patient declined No - Patient declined No - Patient declined No - Patient declined No - Patient declined No - Patient declined No - Patient declined    Current Medications (verified) Outpatient Encounter Medications as of 04/10/2023  Medication Sig   acetaminophen (TYLENOL) 650 MG CR tablet Take 1,300 mg by mouth in the morning and at bedtime.   amLODipine (NORVASC) 10 MG tablet TAKE ONE TABLET BY MOUTH DAILY   aspirin EC 81 MG tablet Take 1 tablet (81 mg total) by mouth daily.   atorvastatin (LIPITOR) 80 MG tablet TAKE ONE TABLET BY MOUTH DAILY   calcium carbonate (OSCAL) 1500 (600 Ca) MG TABS tablet Take 600 mg by mouth 2 (two) times daily.   carboxymethylcellulose (REFRESH PLUS) 0.5 % SOLN Place 1  drop into both eyes 3 (three) times daily as needed (dry/irritated eyes.).   cetirizine (ZYRTEC) 10 MG tablet TAKE ONE TABLET BY MOUTH DAILY   Cholecalciferol 25 MCG (1000 UT) tablet Take 1,000 Units by mouth daily.   Cinnamon 500 MG TABS Take 500 mg by mouth daily.   Coenzyme Q10 (COQ10) 100 MG CAPS Take 100 mg by mouth in the morning and at bedtime.   ezetimibe (ZETIA) 10 MG tablet TAKE ONE TABLET BY MOUTH DAILY   hydrochlorothiazide (HYDRODIURIL) 25 MG tablet TAKE ONE TABLET BY MOUTH DAILY   isosorbide mononitrate (IMDUR) 30 MG 24 hr tablet TAKE ONE TABLET BY MOUTH DAILY   losartan (COZAAR) 25 MG tablet TAKE 1 TABLET BY MOUTH DAILY   methocarbamol (ROBAXIN) 500 MG tablet Take 1 tablet (500 mg total) by mouth 2 (two) times daily as needed.   methocarbamol (ROBAXIN) 500 MG tablet Take 1 tablet (500 mg total) by mouth 2 (two) times daily as needed for muscle spasms.   Multiple Vitamins-Minerals (MULTIVITAMINS THER. W/MINERALS) TABS Take 1 tablet by mouth every evening.   Probiotic Product (PROBIOTIC DAILY) CAPS Take 1 capsule by mouth every evening.   traMADol (ULTRAM) 50 MG tablet Take 1 tablet (50 mg total) by mouth 2 (two) times daily as needed.   No facility-administered encounter medications on file as of 04/10/2023.    Allergies (verified) Ace inhibitors, Shellfish allergy, and Zocor [simvastatin]  History: Past Medical History:  Diagnosis Date   Arthritis    CAD in native artery    a. MI 2001 s/p PTCA/stenting to mid-distal junction of RCA - residual dx treated medically.   Depression    pt reports having it intermittently   Diverticulosis of colon (without mention of hemorrhage)    Essential hypertension, benign    MI (myocardial infarction) (HCC)    Mild pulmonary hypertension (HCC)    Obesity, unspecified    Onychia and paronychia of toe    Osteoarthrosis, unspecified whether generalized or localized, unspecified site    Other and unspecified hyperlipidemia    PONV  (postoperative nausea and vomiting)    Sickle-cell trait (HCC)    Sinus bradycardia    a. HR 40s even on low dose metoprolol - BB stopped with resolution.   Streptococcal sore throat    Type II or unspecified type diabetes mellitus without mention of complication, not stated as uncontrolled    Past Surgical History:  Procedure Laterality Date   ANGIOPLASTY  09/21/00   CARDIAC CATHETERIZATION     JOINT REPLACEMENT     TOTAL KNEE ARTHROPLASTY Right 02/23/2020   Procedure: RIGHT TOTAL KNEE ARTHROPLASTY;  Surgeon: Tarry Kos, MD;  Location: [facility] OR;  Service: Orthopedics;  Laterality: Right;   TOTAL KNEE ARTHROPLASTY Left 12/27/2020   Procedure: LEFT TOTAL KNEE ARTHROPLASTY;  Surgeon: Tarry Kos, MD;  Location: [facility] OR;  Service: Orthopedics;  Laterality: Left;   Family History  Problem Relation Age of Onset   Hypertension Mother    Diabetes type II Mother    Diabetes type II Sister    Breast cancer Sister    Breast cancer Sister    Sickle cell anemia Daughter    Cancer Other    Stroke Other    Diabetes Other    Social History   Socioeconomic History   Marital status: Legally Separated    Spouse name: Not on file   Number of children: 2   Years of education: Not on file   Highest education level: Some college, no degree  Occupational History   Occupation: DAY Associate Professor: SELF-EMPLOYED  Tobacco Use   Smoking status: Former   Smokeless tobacco: Never   Tobacco comments:    quit nov 2001  Vaping Use   Vaping Use: Never used  Substance and Sexual Activity   Alcohol use: No   Drug use: No   Sexual activity: Yes    Birth control/protection: Post-menopausal  Other Topics Concern   Not on file  Social History Narrative   Not on file   Social Determinants of Health   Financial Resource Strain: Low Risk  (04/10/2023)   Overall Financial Resource Strain (CARDIA)    Difficulty of Paying Living Expenses: Not very hard  Recent Concern: Financial Resource Strain -  Medium Risk (02/05/2023)   Overall Financial Resource Strain (CARDIA)    Difficulty of Paying Living Expenses: Somewhat hard  Food Insecurity: Food Insecurity Present (04/10/2023)   Hunger Vital Sign    Worried About Running Out of Food in the Last Year: Sometimes true    Ran Out of Food in the Last Year: Sometimes true  Transportation Needs: No Transportation Needs (02/05/2023)   PRAPARE - Administrator, Civil Service (Medical): No    Lack of Transportation (Non-Medical): No  Physical Activity: Sufficiently Active (04/10/2023)   Exercise Vital Sign    Days of Exercise per Week: 4 days  Minutes of Exercise per Session: 40 min  Stress: No Stress Concern Present (04/10/2023)   [employer] of Occupational Health - Occupational Stress Questionnaire    Feeling of Stress : Only a little  Social Connections: Socially Isolated (04/10/2023)   Social Connection and Isolation Panel [NHANES]    Frequency of Communication with Friends and Family: More than three times a week    Frequency of Social Gatherings with Friends and Family: Twice a week    Attends Religious Services: Never    Database administrator or Organizations: No    Attends Engineer, structural: Never    Marital Status: Separated    Tobacco Counseling Counseling given: Not Answered Tobacco comments: quit nov 2001   Clinical Intake:  Pre-visit preparation completed: Yes  Pain : No/denies pain     Diabetes: No CBG done?: No Did pt. bring in CBG monitor from home?: No  How often do you need to have someone help you when you read instructions, pamphlets, or other written materials from your doctor or pharmacy?: 1 - Never  Interpreter Needed?: No  Information entered by :: Remi Haggard LPN   Activities of Daily Living    04/10/2023   11:41 AM 04/10/2023   11:40 AM  In your present state of health, do you have any difficulty performing the following activities:  Hearing? 0 0  Vision? 0 0   Difficulty concentrating or making decisions? 0 0  Walking or climbing stairs? 0 0  Dressing or bathing? 0 0  Doing errands, shopping? 0 0  Preparing Food and eating ? N N  Using the Toilet? N N  In the past six months, have you accidently leaked urine? Y N  Do you have problems with loss of bowel control? N N  Managing your Medications? N N  Managing your Finances? N   Housekeeping or managing your Housekeeping? N N    Patient Care Team: Bess Kinds, MD as PCP - General (Family Medicine) Croitoru, Rachelle Hora, MD as PCP - Cardiology (Cardiology)  Indicate any recent Medical Services you may have received from other than [facility] providers in the past year (date may be approximate).     Assessment:   This is a routine wellness examination for [place].  Hearing/Vision screen Hearing Screening - Comments:: No trouble hearing Vision Screening - Comments:: Up to date  [facility]   Dietary issues and exercise activities discussed:     Goals Addressed             This Visit's Progress    Patient Stated       Continue current lifestyle       Depression Screen    04/10/2023   11:46 AM 02/08/2023   10:52 AM 09/06/2022    8:49 AM 04/17/2022    8:27 AM 09/14/2021    3:17 PM 08/31/2021    1:35 PM 04/29/2021    9:44 AM  PHQ 2/9 Scores  PHQ - 2 Score 2 0 0 0 0 0 0  PHQ- 9 Score 2 0 0 1 0 0 0    Fall Risk    04/10/2023   11:37 AM 04/06/2023    3:54 PM 02/08/2023   10:52 AM 09/06/2022    8:49 AM 04/17/2022    8:27 AM  Fall Risk   Falls in the past year? 0 0 0 0 1  Number falls in past yr: 0 0  0 0  Injury with Fall? 0 0  0  1  Follow up Falls evaluation completed;Education provided;Falls prevention discussed        MEDICARE RISK AT HOME:   TIMED UP AND GO:  Was the test performed? No    Cognitive Function:        04/10/2023   11:41 AM  6CIT Screen  What Year? 0 points  What month? 0 points  Count back from 20 0 points  Months in reverse 2  points  Repeat phrase 2 points    Immunizations Immunization History  Administered Date(s) Administered   COVID-19, mRNA, vaccine(Comirnaty)12 years and older 09/14/2022   Influenza Split 06/21/2011, 07/05/2012   Influenza Whole 08/06/2007, 07/15/2008   Influenza, High Dose Seasonal PF 07/15/2019   Influenza,inj,Quad PF,6+ Mos 08/19/2014, 09/01/2015, 08/18/2016, 08/09/2017, 08/23/2018   Influenza-Unspecified 07/09/2020   PFIZER Comirnaty(Gray Top)Covid-19 Tri-Sucrose Vaccine 04/29/2021   PFIZER(Purple Top)SARS-COV-2 Vaccination 12/01/2019, 12/22/2019, 09/08/2020   PPD Test 12/17/2012, 05/01/2017   Pfizer Covid-19 Vaccine Bivalent Booster 2yrs & up 11/22/2021   Pneumococcal Conjugate-13 09/03/2017   Pneumococcal Polysaccharide-23 09/09/2012, 06/18/2020   Td 09/08/2002   Tdap 09/09/2012   Zoster Recombinant(Shingrix) 05/04/2018, 07/16/2018    TDAP status: Due, Education has been provided regarding the importance of this vaccine. Advised may receive this vaccine at local pharmacy or Health Dept. Aware to provide a copy of the vaccination record if obtained from local pharmacy or Health Dept. Verbalized acceptance and understanding.  Flu Vaccine status: Up to date  Pneumococcal vaccine status: Up to date  Covid-19 vaccine status: Information provided on how to obtain vaccines.   Qualifies for Shingles Vaccine? No   Zostavax completed Yes   Shingrix Completed?: Yes  Screening Tests Health Maintenance  Topic Date Due   DTaP/Tdap/Td (3 - Td or Tdap) 09/09/2022   INFLUENZA VACCINE  05/10/2023   OPHTHALMOLOGY EXAM  07/13/2023   HEMOGLOBIN A1C  08/11/2023   FOOT EXAM  08/26/2023   Diabetic kidney evaluation - Urine ACR  09/15/2023   Diabetic kidney evaluation - eGFR measurement  02/08/2024   Medicare Annual Wellness (AWV)  04/09/2024   MAMMOGRAM  12/17/2024   Colonoscopy  12/02/2028   Pneumonia Vaccine 60+ Years old  Completed   DEXA SCAN  Completed   COVID-19 Vaccine   Completed   Hepatitis C Screening  Completed   Zoster Vaccines- Shingrix  Completed   HPV VACCINES  Aged Out    Health Maintenance  Health Maintenance Due  Topic Date Due   DTaP/Tdap/Td (3 - Td or Tdap) 09/09/2022    Colorectal cancer screening: Type of screening: Colonoscopy. Completed 2020. Repeat every 10 years  Mammogram status: Completed  . Repeat every year  Bone Density status: Completed 2018. Results reflect: Bone density results: OSTEOPENIA. Repeat every   years.  Will schedule with mammo next year  Lung Cancer Screening: (Low Dose CT Chest recommended if Age 82-80 years, 20 pack-year currently smoking OR have quit w/in 15years.) does not qualify.   Lung Cancer Screening Referral:   Additional Screening:  Hepatitis C Screening: does not qualify; Completed 2017  Vision Screening: Recommended annual ophthalmology exams for early detection of glaucoma and other disorders of the eye. Is the patient up to date with their annual eye exam?  Yes  Who is the provider or what is the name of the office in which the patient attends annual eye exams? [facility] If pt is not established with a provider, would they like to be referred to a provider to establish care? No .   Dental Screening:  Recommended annual dental exams for proper oral hygiene    Community Resource Referral / Chronic Care Management: CRR required this visit?  No   CCM required this visit?  No     Plan:     I have personally reviewed and noted the following in the patient's chart:   Medical and social history Use of alcohol, tobacco or illicit drugs  Current medications and supplements including opioid prescriptions. Patient is not currently taking opioid prescriptions. Functional ability and status Nutritional status Physical activity Advanced directives List of other physicians Hospitalizations, surgeries, and ER visits in previous 12 months Vitals Screenings to include cognitive, depression, and  falls Referrals and appointments  In addition, I have reviewed and discussed with patient certain preventive protocols, quality metrics, and best practice recommendations. A written personalized care plan for preventive services as well as general preventive health recommendations were provided to patient.     Remi Haggard, LPN   10/14/1094   After Visit Summary: (MyChart) Due to this being a telephonic visit, the after visit summary with patients personalized plan was offered to patient via MyChart   Nurse Notes:

## 2023-08-15 ENCOUNTER — Ambulatory Visit: Payer: 59 | Admitting: Student

## 2023-08-27 ENCOUNTER — Ambulatory Visit (INDEPENDENT_AMBULATORY_CARE_PROVIDER_SITE_OTHER): Payer: 59 | Admitting: Podiatry

## 2023-08-27 DIAGNOSIS — M21619 Bunion of unspecified foot: Secondary | ICD-10-CM

## 2023-08-27 DIAGNOSIS — E119 Type 2 diabetes mellitus without complications: Secondary | ICD-10-CM

## 2023-08-27 DIAGNOSIS — Z794 Long term (current) use of insulin: Secondary | ICD-10-CM | POA: Diagnosis not present

## 2023-08-27 NOTE — Progress Notes (Unsigned)
Subjective: Chief Complaint  Patient presents with   Foot Pain    Diabetic foot exam.  No new foot problems today.  She just got diagnosed with sciatica.  States she has a lot of inflammation.  Last HgA1c was 10.57    72 year old female presents the office today for diabetic foot exam.  She states that he is doing well.  She was wearing some heels which flared up her left second toe when she gets a corn in this area.  She does try to a pad on this.  No open lesions that she reports.   Last A1c was 5.9 on Feb 08, 2023  Bess Kinds, MD- Last seen Feb 08, 2023  Objective: AAO x3, NAD DP/PT pulses palpable bilaterally, CRT less than 3 seconds Sensation intact with Semmes Weinstein monofilament. Bunions present bilaterally with hammertoe contracture noted at the second digit.  There is no skin breakdown, callus formation noted of the second toe today but there is some mild irritation noted from where the big toe is rubbing the second toe. No open lesions are noted today.  There is no area of tenderness.  MMT 5/5.  Flatfoot present. No pain with calf compression, swelling, warmth, erythema  Assessment: 72 year old female presents today for diabetic foot exam  Plan: -All treatment options discussed with the patient including all alternatives, risks, complications.  -From a diabetes standpoint she is doing well.  Discussed continue with boot glucose control.  Continue daily foot inspection.  Supportive shoe gear. -We discussed offloading for the second toe and dispensed offloading pads.  Discussed shoe modifications to avoid excess pressure. -Patient encouraged to call the office with any questions, concerns, change in symptoms.   Return in about 1 year (around 08/26/2024) for diabetic foot exam.  Vivi Barrack DPM

## 2023-08-27 NOTE — Patient Instructions (Signed)

## 2023-08-29 ENCOUNTER — Other Ambulatory Visit: Payer: Self-pay | Admitting: Cardiovascular Disease

## 2023-08-29 DIAGNOSIS — I10 Essential (primary) hypertension: Secondary | ICD-10-CM

## 2023-08-29 DIAGNOSIS — E7800 Pure hypercholesterolemia, unspecified: Secondary | ICD-10-CM

## 2023-09-07 ENCOUNTER — Other Ambulatory Visit: Payer: Self-pay | Admitting: Cardiovascular Disease

## 2023-09-07 DIAGNOSIS — E7800 Pure hypercholesterolemia, unspecified: Secondary | ICD-10-CM

## 2023-09-07 DIAGNOSIS — I10 Essential (primary) hypertension: Secondary | ICD-10-CM

## 2023-09-11 ENCOUNTER — Other Ambulatory Visit: Payer: Self-pay

## 2023-09-11 DIAGNOSIS — E7800 Pure hypercholesterolemia, unspecified: Secondary | ICD-10-CM

## 2023-09-11 DIAGNOSIS — I10 Essential (primary) hypertension: Secondary | ICD-10-CM

## 2023-09-11 MED ORDER — AMLODIPINE BESYLATE 10 MG PO TABS: 10.0000 mg | ORAL_TABLET | Freq: Every day | ORAL | 0 refills | Status: DC

## 2023-09-11 MED ORDER — ATORVASTATIN CALCIUM 80 MG PO TABS: 80.0000 mg | ORAL_TABLET | Freq: Every day | ORAL | 0 refills | Status: DC

## 2023-09-11 MED ORDER — EZETIMIBE 10 MG PO TABS: 10.0000 mg | ORAL_TABLET | Freq: Every day | ORAL | 0 refills | Status: DC

## 2023-09-12 ENCOUNTER — Other Ambulatory Visit: Payer: Self-pay | Admitting: Medical Genetics

## 2023-09-27 ENCOUNTER — Other Ambulatory Visit (HOSPITAL_COMMUNITY)
Admission: RE | Admit: 2023-09-27 | Discharge: 2023-09-27 | Disposition: A | Discharge status: Home / Self Care | Payer: Self-pay | Source: Ambulatory Visit | Attending: Medical Genetics | Admitting: Medical Genetics

## 2023-10-08 LAB — GENECONNECT MOLECULAR SCREEN: Genetic Analysis Overall Interpretation: NEGATIVE

## 2023-11-01 ENCOUNTER — Other Ambulatory Visit: Payer: Self-pay | Admitting: Student

## 2023-11-01 DIAGNOSIS — E2839 Other primary ovarian failure: Secondary | ICD-10-CM

## 2023-11-12 ENCOUNTER — Other Ambulatory Visit: Payer: Self-pay

## 2023-11-12 DIAGNOSIS — E7800 Pure hypercholesterolemia, unspecified: Secondary | ICD-10-CM

## 2023-11-12 DIAGNOSIS — I10 Essential (primary) hypertension: Secondary | ICD-10-CM

## 2023-11-12 MED ORDER — HYDROCHLOROTHIAZIDE 25 MG PO TABS
25.0000 mg | ORAL_TABLET | Freq: Every day | ORAL | 0 refills | Status: DC
Start: 2023-11-12 — End: 2024-02-11

## 2023-11-18 NOTE — Progress Notes (Signed)
 Cardiology Office Note:    Date:  11/26/2023   ID:  [name], Park Meo 24-Dec-1950, MRN [id number]  PCP:  Sarita Bottom, NP   [facility] Providers Cardiologist:  Thurmon Fair, MD     Referring MD: Bess Kinds, MD   Chief Complaint  Patient presents with   Coronary Artery Disease    History of Present Illness:    Terri Estrada is a 73 y.o. female with a hx of coronary artery disease, hypercholesterolemia, HTN, borderline glucose elevation, moderate obesity.  She presented with acute inferior wall myocardial infarction 2001 and received a stent to the mid-distal right coronary artery.  She has not had any coronary events since.  She has preserved left ventricular systolic function (EF 60 to 65% by echocardiogram performed in September 2020).   She has done a great job of remaining physically active.  She exercises at the [facility] twice a week (lifting weights, group exercises, etc.) and then goes walking another 3 days a week.  She remains fiercely independent and takes care of all her own needs.  The patient specifically denies any chest pain at rest or with exertion, dyspnea at rest or with exertion, orthopnea, paroxysmal nocturnal dyspnea, syncope, palpitations, focal neurological deficits, intermittent claudication, lower extremity edema, unexplained weight gain, cough, hemoptysis or wheezing.  Unfortunately she has not tolerated atorvastatin either, even when taken just 3 days a week.  She has terrible muscle spasms.  She is currently only taking ezetimibe.  She was also previously intolerant to simvastatin.  While on statin her metabolic control was borderline adequate within LDL of 85.  Hemoglobin A1c is also borderline at 5.9%.  She remains mildly obese with a BMI of 33 but is well below her peak weight of 300 pounds a few years ago.  Past Medical History:  Diagnosis Date   Arthritis    CAD in native artery    a. MI 2001 s/p PTCA/stenting to  mid-distal junction of RCA - residual dx treated medically.   Depression    pt reports having it intermittently   Diverticulosis of colon (without mention of hemorrhage)    Essential hypertension, benign    MI (myocardial infarction) (HCC)    Mild pulmonary hypertension (HCC)    Obesity, unspecified    Onychia and paronychia of toe    Osteoarthrosis, unspecified whether generalized or localized, unspecified site    Other and unspecified hyperlipidemia    PONV (postoperative nausea and vomiting)    Sickle-cell trait (HCC)    Sinus bradycardia    a. HR 40s even on low dose metoprolol - BB stopped with resolution.   Streptococcal sore throat    Type II or unspecified type diabetes mellitus without mention of complication, not stated as uncontrolled     Past Surgical History:  Procedure Laterality Date   ANGIOPLASTY  09/21/00   CARDIAC CATHETERIZATION     JOINT REPLACEMENT     TOTAL KNEE ARTHROPLASTY Right 02/23/2020   Procedure: RIGHT TOTAL KNEE ARTHROPLASTY;  Surgeon: Tarry Kos, MD;  Location: [facility] OR;  Service: Orthopedics;  Laterality: Right;   TOTAL KNEE ARTHROPLASTY Left 12/27/2020   Procedure: LEFT TOTAL KNEE ARTHROPLASTY;  Surgeon: Tarry Kos, MD;  Location: [facility] OR;  Service: Orthopedics;  Laterality: Left;    Current Medications: Current Meds  Medication Sig   acetaminophen (TYLENOL) 650 MG CR tablet Take 1,300 mg by mouth in the morning and at bedtime.   amLODipine (NORVASC) 10 MG tablet Take  1 tablet (10 mg total) by mouth daily.   aspirin EC 81 MG tablet Take 1 tablet (81 mg total) by mouth daily.   calcium carbonate (OSCAL) 1500 (600 Ca) MG TABS tablet Take 600 mg by mouth 2 (two) times daily.   carboxymethylcellulose (REFRESH PLUS) 0.5 % SOLN Place 1 drop into both eyes 3 (three) times daily as needed (dry/irritated eyes.).   cetirizine (ZYRTEC) 10 MG tablet TAKE ONE TABLET BY MOUTH DAILY   Cholecalciferol 25 MCG (1000 UT) tablet Take 1,000 Units by mouth daily.    Cinnamon 500 MG TABS Take 500 mg by mouth daily.   Coenzyme Q10 (COQ10) 200 MG CAPS Take 200 mg by mouth in the morning and at bedtime.   Evolocumab (REPATHA SURECLICK) 140 MG/ML SOAJ Inject 140 mg into the skin every 14 (fourteen) days.   ezetimibe (ZETIA) 10 MG tablet Take 1 tablet (10 mg total) by mouth daily.   hydrochlorothiazide (HYDRODIURIL) 25 MG tablet Take 1 tablet (25 mg total) by mouth daily.   isosorbide mononitrate (IMDUR) 30 MG 24 hr tablet TAKE 1 TABLET BY MOUTH DAILY   losartan (COZAAR) 25 MG tablet TAKE 1 TABLET BY MOUTH DAILY   Multiple Vitamins-Minerals (MULTIVITAMINS THER. W/MINERALS) TABS Take 1 tablet by mouth every evening.   Probiotic Product (PROBIOTIC DAILY) CAPS Take 1 capsule by mouth every evening.   traMADol (ULTRAM) 50 MG tablet Take 1 tablet (50 mg total) by mouth 2 (two) times daily as needed.   [DISCONTINUED] atorvastatin (LIPITOR) 80 MG tablet Take 1 tablet (80 mg total) by mouth daily.     Allergies:   Atorvastatin, Ace inhibitors, Shellfish allergy, and Zocor [simvastatin]   Social History   Socioeconomic History   Marital status: Legally Separated    Spouse name: Not on file   Number of children: 2   Years of education: Not on file   Highest education level: Some college, no degree  Occupational History   Occupation: DAY Associate Professor: SELF-EMPLOYED  Tobacco Use   Smoking status: Former   Smokeless tobacco: Never   Tobacco comments:    quit nov 2001  Vaping Use   Vaping status: Never Used  Substance and Sexual Activity   Alcohol use: No   Drug use: No   Sexual activity: Yes    Birth control/protection: Post-menopausal  Other Topics Concern   Not on file  Social History Narrative   Not on file   Social Drivers of Health   Financial Resource Strain: Low Risk  (04/10/2023)   Overall Financial Resource Strain (CARDIA)    Difficulty of Paying Living Expenses: Not very hard  Recent Concern: Financial Resource Strain - Medium Risk  (02/05/2023)   Overall Financial Resource Strain (CARDIA)    Difficulty of Paying Living Expenses: Somewhat hard  Food Insecurity: Food Insecurity Present (04/10/2023)   Hunger Vital Sign    Worried About Running Out of Food in the Last Year: Sometimes true    Ran Out of Food in the Last Year: Sometimes true  Transportation Needs: No Transportation Needs (02/05/2023)   PRAPARE - Administrator, Civil Service (Medical): No    Lack of Transportation (Non-Medical): No  Physical Activity: Sufficiently Active (04/10/2023)   Exercise Vital Sign    Days of Exercise per Week: 4 days    Minutes of Exercise per Session: 40 min  Stress: No Stress Concern Present (04/10/2023)   [employer] of Occupational Health - Occupational Stress Questionnaire  Feeling of Stress : Only a little  Social Connections: Socially Isolated (04/10/2023)   Social Connection and Isolation Panel [NHANES]    Frequency of Communication with Friends and Family: More than three times a week    Frequency of Social Gatherings with Friends and Family: Twice a week    Attends Religious Services: Never    Database administrator or Organizations: No    Attends Engineer, structural: Never    Marital Status: Separated     Family History: The patient's family history includes Breast cancer in her sister and sister; Cancer in an other family member; Diabetes in an other family member; Diabetes type II in her mother and sister; Hypertension in her mother; Sickle cell anemia in her daughter; Stroke in an other family member.  ROS:   Please see the history of present illness.     All other systems reviewed and are negative.  EKGs/Labs/Other Studies Reviewed:    The following studies were reviewed today: Echocardiogram 2020   1. Left ventricular ejection fraction, by visual estimation, is 60 to  65%. The left ventricle has normal function. Normal left ventricular size.  There is mildly increased left  ventricular hypertrophy.   2. Left ventricular diastolic Doppler parameters are consistent with  impaired relaxation pattern of LV diastolic filling.   3. Global longitudinal strain= -23%.   4. Global right ventricle has normal systolic function.The right  ventricular size is normal. No increase in right ventricular wall  thickness.   EKG:    EKG Interpretation Date/Time:  Monday November 26 2023 09:35:06 EST Ventricular Rate:  73 PR Interval:  138 QRS Duration:  92 QT Interval:  404 QTC Calculation: 445 R Axis:   -1  Text Interpretation: Normal sinus rhythm Inferior infarct (cited on or before 31-Aug-2000) Cannot rule out Anterior infarct (cited on or before 19-Feb-2020) When compared with ECG of 23-Dec-2020 11:20, No significant change was found Confirmed by Addelynn Batte ([id number]) on 11/26/2023 9:47:32 AM        Recent Labs: 02/08/2023: BUN 14; Creatinine, Ser 0.78; Potassium 4.1; Sodium 140  Recent Lipid Panel    Component Value Date/Time   CHOL 168 02/08/2023 1721   TRIG 83 02/08/2023 1721   HDL 68 02/08/2023 1721   CHOLHDL 2.5 02/08/2023 1721   CHOLHDL 2.4 03/26/2015 1033   VLDL 14 03/26/2015 1033   LDLCALC 85 02/08/2023 1721   LDLDIRECT 63 03/15/2012 1520     Risk Assessment/Calculations:                    Physical Exam:    VS:  BP 134/84 (BP Location: Left Arm, Patient Position: Sitting)   Pulse 73   Ht 5\' 1"  (1.549 m)   Wt 176 lb 12.8 oz (80.2 kg)   SpO2 96%   BMI 33.41 kg/m     Wt Readings from Last 3 Encounters:  11/26/23 176 lb 12.8 oz (80.2 kg)  02/08/23 186 lb 9.6 oz (84.6 kg)  11/01/22 185 lb 12.8 oz (84.3 kg)      General: Alert, oriented x3, no distress, mildly obese Head: no evidence of trauma, PERRL, EOMI, no exophtalmos or lid lag, no myxedema, no xanthelasma; normal ears, nose and oropharynx Neck: normal jugular venous pulsations and no hepatojugular reflux; brisk carotid pulses without delay and no carotid bruits Chest: clear to  auscultation, no signs of consolidation by percussion or palpation, normal fremitus, symmetrical and full respiratory excursions Cardiovascular: normal position and quality  of the apical impulse, regular rhythm, normal first and second heart sounds, 2/6 aortic ejection murmur) no diastolic murmurs, rubs or gallops Abdomen: no tenderness or distention, no masses by palpation, no abnormal pulsatility or arterial bruits, normal bowel sounds, no hepatosplenomegaly Extremities: no clubbing, cyanosis or edema; 2+ radial, ulnar and brachial pulses bilaterally; 2+ right femoral, posterior tibial and dorsalis pedis pulses; 2+ left femoral, posterior tibial and dorsalis pedis pulses; no subclavian or femoral bruits Neurological: grossly nonfocal Psych: Normal mood and affect    ASSESSMENT:    1. Coronary artery disease involving native coronary artery of native heart without angina pectoris   2. Pure hypercholesterolemia   3. Moderate obesity   4. Prediabetes   5. Essential hypertension   6. Drug-induced bradycardia   7. Nonrheumatic aortic valve stenosis      PLAN:    In order of problems listed above:  CAD: Remains very active and asymptomatic.  Not on a beta-blocker due to history of bradycardia.  On aspirin. Hypercholesterolemia: Unfortunately appears to be intolerant of at least 2 different statin agents.  Will plan treatment with a PCSK 9 inhibitor such as Repatha or Praluent. Obesity: Remains mildly obese and with a hemoglobin A1c in prediabetes range, but working hard at being physically active and eating a healthy diet. HTN: Well-controlled on current medications, continue. Sinus bradycardia: Currently her heart rate is normal, but in the past she became symptomatic due to beta-blocker in these bradycardia.  Aortic valve sclerosis: Murmur a little different this year.  Reevaluate the echocardiogram to see if she has progressed to aortic stenosis.           Medication  Adjustments/Labs and Tests Ordered: Current medicines are reviewed at length with the patient today.  Concerns regarding medicines are outlined above.  Orders Placed This Encounter  Procedures   Lipid panel   Lipid panel   AMB Referral to [facility] Pharm-D   EKG 12-Lead   ECHOCARDIOGRAM COMPLETE    Meds ordered this encounter  Medications   Evolocumab (REPATHA SURECLICK) 140 MG/ML SOAJ    Sig: Inject 140 mg into the skin every 14 (fourteen) days.    Dispense:  2 mL    Refill:  11   Patient Instructions  Medication Instructions:  Stop taking atorvastatin. Statin allergy added to chart. New script for Repath sent to pharmacy. Our PharmD department will contact you to discuss this medication. *If you need a refill on your cardiac medications before your next appointment, please call your pharmacy*   Lab Work: Fasting Lipid profile today and then again in 3 months.  If you have labs (blood work) drawn today and your tests are completely normal, you will receive your results only by: MyChart Message (if you have MyChart) OR A paper copy in the mail If you have any lab test that is abnormal or we need to change your treatment, we will call you to review the results.   Testing/Procedures: Your physician has requested that you have an echocardiogram. Echocardiography is a painless test that uses sound waves to create images of your heart. It provides your doctor with information about the size and shape of your heart and how well your heart's chambers and valves are working. This procedure takes approximately one hour. There are no restrictions for this procedure. Please do NOT wear cologne, perfume, aftershave, or lotions (deodorant is allowed). Please arrive 15 minutes prior to your appointment time. [address].  Please note: We ask at that  you not bring children with you during ultrasound (echo/ vascular) testing. Due to room size and safety concerns, children are not allowed in  the ultrasound rooms during exams. Our front office staff cannot provide observation of children in our lobby area while testing is being conducted. An adult accompanying a patient to their appointment will only be allowed in the ultrasound room at the discretion of the ultrasound technician under special circumstances. We apologize for any inconvenience.    Follow-Up: At [facility], you and your health needs are our priority.  As part of our continuing mission to provide you with exceptional heart care, we have created designated Provider Care Teams.  These Care Teams include your primary Cardiologist (physician) and Advanced Practice Providers (APPs -  Physician Assistants and Nurse Practitioners) who all work together to provide you with the care you need, when you need it.  We recommend signing up for the patient portal called "MyChart".  Sign up information is provided on this After Visit Summary.  MyChart is used to connect with patients for Virtual Visits (Telemedicine).  Patients are able to view lab/test results, encounter notes, upcoming appointments, etc.  Non-urgent messages can be sent to your provider as well.   To learn more about what you can do with MyChart, go to [email or web address].    Your next appointment:   1 year(s)  Provider:   Thurmon Fair, MD     Other Instructions

## 2023-11-26 ENCOUNTER — Ambulatory Visit: Payer: 59 | Attending: Cardiovascular Disease | Admitting: Cardiovascular Disease

## 2023-11-26 ENCOUNTER — Encounter: Payer: Self-pay | Admitting: Cardiovascular Disease

## 2023-11-26 VITALS — BP 134/84 | HR 73 | Ht 61.0 in | Wt 176.8 lb

## 2023-11-26 DIAGNOSIS — E669 Obesity, unspecified: Secondary | ICD-10-CM

## 2023-11-26 DIAGNOSIS — I350 Nonrheumatic aortic (valve) stenosis: Secondary | ICD-10-CM

## 2023-11-26 DIAGNOSIS — E7800 Pure hypercholesterolemia, unspecified: Secondary | ICD-10-CM | POA: Diagnosis not present

## 2023-11-26 DIAGNOSIS — R7303 Prediabetes: Secondary | ICD-10-CM | POA: Diagnosis not present

## 2023-11-26 DIAGNOSIS — I2510 Atherosclerotic heart disease of native coronary artery without angina pectoris: Secondary | ICD-10-CM | POA: Diagnosis not present

## 2023-11-26 DIAGNOSIS — I10 Essential (primary) hypertension: Secondary | ICD-10-CM

## 2023-11-26 DIAGNOSIS — T50905D Adverse effect of unspecified drugs, medicaments and biological substances, subsequent encounter: Secondary | ICD-10-CM

## 2023-11-26 DIAGNOSIS — R001 Bradycardia, unspecified: Secondary | ICD-10-CM

## 2023-11-26 LAB — LIPID PANEL
Chol/HDL Ratio: 3 {ratio} (ref 0.0–4.4)
Cholesterol, Total: 187 mg/dL (ref 100–199)
HDL: 63 mg/dL (ref >39)
LDL Chol Calc (NIH): 109 mg/dL — ABNORMAL HIGH (ref 0–99)
Triglycerides: 82 mg/dL (ref 0–149)
VLDL Cholesterol Cal: 15 mg/dL (ref 5–40)

## 2023-11-26 MED ORDER — REPATHA SURECLICK 140 MG/ML ~~LOC~~ SOAJ: 140.0000 mg | SUBCUTANEOUS | 11 refills | Status: DC

## 2023-11-26 NOTE — Patient Instructions (Signed)
 Medication Instructions:  Stop taking atorvastatin. Statin allergy added to chart. New script for Repath sent to pharmacy. Our PharmD department will contact you to discuss this medication. *If you need a refill on your cardiac medications before your next appointment, please call your pharmacy*   Lab Work: Fasting Lipid profile today and then again in 3 months.  If you have labs (blood work) drawn today and your tests are completely normal, you will receive your results only by: MyChart Message (if you have MyChart) OR A paper copy in the mail If you have any lab test that is abnormal or we need to change your treatment, we will call you to review the results.   Testing/Procedures: Your physician has requested that you have an echocardiogram. Echocardiography is a painless test that uses sound waves to create images of your heart. It provides your doctor with information about the size and shape of your heart and how well your heart's chambers and valves are working. This procedure takes approximately one hour. There are no restrictions for this procedure. Please do NOT wear cologne, perfume, aftershave, or lotions (deodorant is allowed). Please arrive 15 minutes prior to your appointment time. [address].  Please note: We ask at that you not bring children with you during ultrasound (echo/ vascular) testing. Due to room size and safety concerns, children are not allowed in the ultrasound rooms during exams. Our front office staff cannot provide observation of children in our lobby area while testing is being conducted. An adult accompanying a patient to their appointment will only be allowed in the ultrasound room at the discretion of the ultrasound technician under special circumstances. We apologize for any inconvenience.    Follow-Up: At [facility], you and your health needs are our priority.  As part of our continuing mission to provide you with exceptional heart care,  we have created designated Provider Care Teams.  These Care Teams include your primary Cardiologist (physician) and Advanced Practice Providers (APPs -  Physician Assistants and Nurse Practitioners) who all work together to provide you with the care you need, when you need it.  We recommend signing up for the patient portal called "MyChart".  Sign up information is provided on this After Visit Summary.  MyChart is used to connect with patients for Virtual Visits (Telemedicine).  Patients are able to view lab/test results, encounter notes, upcoming appointments, etc.  Non-urgent messages can be sent to your provider as well.   To learn more about what you can do with MyChart, go to [email or web address].    Your next appointment:   1 year(s)  Provider:   Thurmon Fair, MD     Other Instructions

## 2023-11-27 ENCOUNTER — Encounter: Payer: Self-pay | Admitting: Cardiovascular Disease

## 2023-12-03 ENCOUNTER — Other Ambulatory Visit (HOSPITAL_COMMUNITY): Payer: 59

## 2023-12-04 ENCOUNTER — Telehealth: Payer: Self-pay | Admitting: Cardiovascular Disease

## 2023-12-04 ENCOUNTER — Other Ambulatory Visit (HOSPITAL_COMMUNITY): Payer: Self-pay

## 2023-12-04 ENCOUNTER — Telehealth: Payer: Self-pay | Admitting: Pharmacy Technician

## 2023-12-04 NOTE — Telephone Encounter (Signed)
 Pharmacy Patient Advocate Encounter  Received notification from [facility] that Prior Authorization for repatha has been APPROVED from 12/04/23 to 06/02/24. Unable to obtain price due to refill too soon rejection, last fill date 11/26/23 next available fill date03/10/25   PA #/Case ID/Reference #: [id number]

## 2023-12-04 NOTE — Telephone Encounter (Signed)
 Pt c/o medication issue:  1. Name of Medication: Evolocumab (REPATHA SURECLICK) 140 MG/ML SOAJ   2. How are you currently taking this medication (dosage and times per day)? Inject 140 mg into the skin every 14 (fourteen) days.   3. Are you having a reaction (difficulty breathing--STAT)? No  4. What is your medication issue? Pt would like a c/b regarding letter that she received from her insurance about medication. Pt also states that she would prefer the Rosuvastatin. Due to one of the side effects for Repatha being increased blood sugar. Pt states that she is a pre diabetic. Please advise

## 2023-12-04 NOTE — Telephone Encounter (Signed)
 Pharmacy Patient Advocate Encounter   Received notification from Pt Calls Messages that prior authorization for repatha is required/requested.   Insurance verification completed.   The patient is insured through [employer] .   Per test claim: PA required; PA submitted to above mentioned insurance via CoverMyMeds Key/confirmation #/EOC [facility] Status is pending

## 2023-12-04 NOTE — Telephone Encounter (Signed)
 Pavero, Cristal Deer, RPH  You8 minutes ago (1:48 PM)    Yes Repatha has a small risk of increasing blood sugar, however the statins do as well. Would recommend trying Repatha first and watching her blood glucose   Patient identification verified by 2 forms. Marilynn Rail, RN    Called and spoke to patient  Relayed pharmacy message  Patient states:   -Received paperwork from [employer]   -would not like to proceed with Rx if there is a cost   -paperwork mentioned prior authorization  -[facility] [employer] [facility] nurse checked her blood sugar yesterday and it was 6.9   -she took first injection of repatha on Saturday   -concerned blood sugar was increased because of repatha  Informed patient:   -if she applied first injection Saturday, unlikely for repatha to increase A1c yesterday   -message sent to prior auth team for assistance with PA request  Patient verbalized understanding, no questions at this time

## 2023-12-04 NOTE — Telephone Encounter (Signed)
 Patient identification verified by 2 forms. Marilynn Rail, RN    Called and spoke to patient  Informed patient:   -PA approved   -refills available at pharmacy  Patient verbalized understanding, no questions at this time

## 2023-12-09 ENCOUNTER — Other Ambulatory Visit: Payer: Self-pay | Admitting: Cardiovascular Disease

## 2023-12-09 DIAGNOSIS — E7800 Pure hypercholesterolemia, unspecified: Secondary | ICD-10-CM

## 2023-12-09 DIAGNOSIS — I10 Essential (primary) hypertension: Secondary | ICD-10-CM

## 2023-12-11 ENCOUNTER — Encounter: Payer: Self-pay | Admitting: Cardiovascular Disease

## 2023-12-11 ENCOUNTER — Ambulatory Visit (HOSPITAL_COMMUNITY): Payer: 59 | Attending: Cardiology

## 2023-12-11 ENCOUNTER — Telehealth: Payer: Self-pay | Admitting: Orthopaedic Surgery

## 2023-12-11 DIAGNOSIS — I350 Nonrheumatic aortic (valve) stenosis: Secondary | ICD-10-CM | POA: Insufficient documentation

## 2023-12-11 LAB — ECHOCARDIOGRAM COMPLETE
AV Mean grad: 8.5 mmHg
AV Peak grad: 16.5 mmHg
Ao pk vel: 2.03 m/s
Area-P 1/2: 3.37 cm2
S' Lateral: 2.1 cm

## 2023-12-11 NOTE — Telephone Encounter (Signed)
 Called patient to advise on message below. States she will call us back if she would like for [place] to send her to pain clinic.

## 2023-12-11 NOTE — Telephone Encounter (Signed)
 Cannot refill.  Too far out from surgery.  Happy to send to pain mgmt if she would like

## 2023-12-11 NOTE — Telephone Encounter (Signed)
 Pt called requesting refill of tramadol. Please send to [facility]. Pt phone number is [phone].

## 2023-12-11 NOTE — Telephone Encounter (Signed)
Sent her mychart msg. ? ?

## 2023-12-31 NOTE — Progress Notes (Unsigned)
   Subjective:    Patient ID: Terri Estrada, female    DOB: 09-30-1951, 73 y.o.   MRN: [id number]   CC: New Patient  HPI: PMHx: Past Medical History:  Diagnosis Date   Arthritis    CAD in native artery    a. MI 2001 s/p PTCA/stenting to mid-distal junction of RCA - residual dx treated medically.   Depression    pt reports having it intermittently   Diverticulosis of colon (without mention of hemorrhage)    Essential hypertension, benign    MI (myocardial infarction) (HCC)    Mild pulmonary hypertension (HCC)    Obesity, unspecified    Onychia and paronychia of toe    Osteoarthrosis, unspecified whether generalized or localized, unspecified site    Other and unspecified hyperlipidemia    PONV (postoperative nausea and vomiting)    Sickle-cell trait (HCC)    Sinus bradycardia    a. HR 40s even on low dose metoprolol - BB stopped with resolution.   Streptococcal sore throat    Type II or unspecified type diabetes mellitus without mention of complication, not stated as uncontrolled      Surgical Hx: Past Surgical History:  Procedure Laterality Date   ANGIOPLASTY  09/21/00   CARDIAC CATHETERIZATION     JOINT REPLACEMENT     TOTAL KNEE ARTHROPLASTY Right 02/23/2020   Procedure: RIGHT TOTAL KNEE ARTHROPLASTY;  Surgeon: Tarry Kos, MD;  Location: [facility] OR;  Service: Orthopedics;  Laterality: Right;   TOTAL KNEE ARTHROPLASTY Left 12/27/2020   Procedure: LEFT TOTAL KNEE ARTHROPLASTY;  Surgeon: Tarry Kos, MD;  Location: [facility] OR;  Service: Orthopedics;  Laterality: Left;     Family Hx: Family History  Problem Relation Age of Onset   Hypertension Mother    Diabetes type II Mother    Diabetes type II Sister    Breast cancer Sister    Breast cancer Sister    Sickle cell anemia Daughter    Cancer Other    Stroke Other    Diabetes Other      Social Hx: Current Social History 12/31/2023   Who lives at home: ***  Work / Education:  *** Religious / Personal Beliefs:  ***  Food/transportation/housing insecurity: ***  Who would speak for you about health care matters: ***  Living will or Advance Directive: ***   Medications:    Preventative Screening Colonoscopy: *** Mammogram:*** Pap test: *** PSA:*** DEXA:*** Tetanus vaccine: *** Pneumonia vaccine:*** Shingles vaccine:***  Smoking status reviewed   Objective:  There were no vitals taken for this visit. Vitals and nursing note reviewed  General: well nourished, in no acute distress HEENT: normocephalic, TM's visualized bilaterally, no scleral icterus or conjunctival pallor, no nasal discharge, moist mucous membranes, good dentition without erythema or discharge noted in posterior oropharynx Neck: supple, non-tender, without lymphadenopathy Cardiac: RRR, clear S1 and S2, no murmurs, rubs, or gallops Respiratory: clear to auscultation bilaterally, no increased work of breathing Abdomen: soft, nontender, nondistended, no masses or organomegaly. Bowel sounds present Extremities: no edema or cyanosis. Warm, well perfused. 2+ radial and PT pulses bilaterally Skin: warm and dry, no rashes noted Neuro: alert and oriented, no focal deficits   Assessment & Plan:    No problem-specific Assessment & Plan notes found for this encounter.      Elberta Fortis, DO

## 2024-01-01 ENCOUNTER — Ambulatory Visit (INDEPENDENT_AMBULATORY_CARE_PROVIDER_SITE_OTHER): Payer: 59 | Admitting: Family Medicine

## 2024-01-01 ENCOUNTER — Encounter: Payer: Self-pay | Admitting: Family Medicine

## 2024-01-01 VITALS — BP 132/66 | HR 73 | Ht 61.0 in | Wt 176.2 lb

## 2024-01-01 DIAGNOSIS — R7303 Prediabetes: Secondary | ICD-10-CM

## 2024-01-01 DIAGNOSIS — E119 Type 2 diabetes mellitus without complications: Secondary | ICD-10-CM

## 2024-01-01 DIAGNOSIS — M170 Bilateral primary osteoarthritis of knee: Secondary | ICD-10-CM | POA: Diagnosis not present

## 2024-01-01 LAB — POCT GLYCOSYLATED HEMOGLOBIN (HGB A1C): HbA1c, POC (prediabetic range): 6 % (ref 5.7–6.4)

## 2024-01-01 MED ORDER — TRAMADOL HCL 50 MG PO TABS
50.0000 mg | ORAL_TABLET | Freq: Every day | ORAL | 2 refills | Status: DC | PRN
Start: 2024-01-01 — End: 2024-08-13

## 2024-01-01 NOTE — Patient Instructions (Signed)
 It was wonderful to see you today! Thank you for choosing [facility].   Please bring ALL of your medications with you to every visit.   Today we talked about:  I refilled your Tramadol. As we discussed please use it sparingly and default to using the Tylenol and other supportive care first.  Please continue to stay active and exercise and even aquatic therapy may be a low impact option. Your A1c looks great today, continue to manage it with your diet as I do not think you need medication at this time.  We will give you a urine cup today please bring it back when able so we can run a urine analysis to check your kidney function. Please continue to follow-up with a cardiologist as scheduled for management of your heart disease.  Please follow up in 4 months  If you haven't already, sign up for My Chart to have easy access to your labs results, and communication with your primary care physician.   We are checking some labs today. If they are abnormal, I will call you. If they are normal, I will send you a MyChart message (if it is active) or a letter in the mail. If you do not hear about your labs in the next 2 weeks, please call the office.  Call the clinic at [phone] if your symptoms worsen or you have any concerns.  Please be sure to schedule follow up at the front desk before you leave today.   Elberta Fortis, DO Family Medicine

## 2024-01-01 NOTE — Assessment & Plan Note (Signed)
 Chronic, s/p bilateral knee replacements and seen by orthopedics.  Primarily utilizing Tylenol arthritis from removal but occasionally utilizes tramadol for severe pain.  PDMP reviewed and appropriate, will send in refill and monitor use. -Tramadol 50 mg daily as needed

## 2024-01-02 DIAGNOSIS — E119 Type 2 diabetes mellitus without complications: Secondary | ICD-10-CM | POA: Diagnosis not present

## 2024-01-03 ENCOUNTER — Encounter: Payer: Self-pay | Admitting: Family Medicine

## 2024-01-03 LAB — MICROALBUMIN / CREATININE URINE RATIO
Creatinine, Urine: 68.7 mg/dL
Microalb/Creat Ratio: 15 mg/g{creat} (ref 0–29)
Microalbumin, Urine: 10.4 ug/mL

## 2024-01-09 DIAGNOSIS — Z4689 Encounter for fitting and adjustment of other specified devices: Secondary | ICD-10-CM | POA: Diagnosis not present

## 2024-01-21 ENCOUNTER — Ambulatory Visit: Payer: 59 | Attending: Internal Medicine | Admitting: Pharmacist

## 2024-01-21 DIAGNOSIS — E7800 Pure hypercholesterolemia, unspecified: Secondary | ICD-10-CM | POA: Diagnosis not present

## 2024-01-22 ENCOUNTER — Telehealth: Payer: Self-pay | Admitting: Cardiovascular Disease

## 2024-01-22 DIAGNOSIS — I2510 Atherosclerotic heart disease of native coronary artery without angina pectoris: Secondary | ICD-10-CM

## 2024-01-22 DIAGNOSIS — E7800 Pure hypercholesterolemia, unspecified: Secondary | ICD-10-CM

## 2024-01-22 LAB — LIPID PANEL
Chol/HDL Ratio: 2 ratio (ref 0.0–4.4)
Cholesterol, Total: 128 mg/dL (ref 100–199)
HDL: 63 mg/dL (ref >39)
LDL Chol Calc (NIH): 49 mg/dL (ref 0–99)
Triglycerides: 86 mg/dL (ref 0–149)
VLDL Cholesterol Cal: 16 mg/dL (ref 5–40)

## 2024-01-22 NOTE — Telephone Encounter (Signed)
 Pt c/o medication issue:  1. Name of Medication:   Evolocumab (REPATHA SURECLICK) 140 MG/ML SOAJ   2. How are you currently taking this medication (dosage and times per day)?   3. Are you having a reaction (difficulty breathing--STAT)?   4. What is your medication issue?   Patient stated she does not want to take this medication and wants to be put on Rosuvastatin.

## 2024-01-23 MED ORDER — ROSUVASTATIN CALCIUM 20 MG PO TABS
20.0000 mg | ORAL_TABLET | Freq: Every day | ORAL | 3 refills | Status: AC
Start: 2024-01-23 — End: 2024-08-28

## 2024-01-23 NOTE — Telephone Encounter (Signed)
 Patient returned RN's call.

## 2024-01-23 NOTE — Telephone Encounter (Signed)
 Spoke to patient.  Patient states she was aware the Repatha was causing her elevated Hgb A1c,  She states  that she had it rechecked by her primary provider and HgbA1c decrease to 6.0 per patient    She states she spoke with Dr Alvis Ba and informed him that she would take at least 4 doses and she did.  Patient states she does not want to take Repatha anymore ," I do not like giving myself shots"   Patient is aware of  Dr Croitoru's response to the her message.   She is aware to pick up medication and do labs in 3 months.

## 2024-01-23 NOTE — Telephone Encounter (Signed)
 Attempted to call patient, unable to connect, call was answered and then disconnected.

## 2024-01-23 NOTE — Telephone Encounter (Signed)
 OK we can try rosuvastatin 20 mg daily and check lipids in 3 months. Please reiterate that I do not think the Repatha is the likely cause for elevated blod glucose.

## 2024-01-29 ENCOUNTER — Telehealth: Payer: Self-pay | Admitting: Cardiovascular Disease

## 2024-01-29 NOTE — Telephone Encounter (Signed)
 Spoke to patient she stated she missed her appointment with PharmD today.Stated she don't think she needs to she PharmD in lipid clinic.Stated she is eating a better diet and taking Crestor  20 mg everyday.She will repeat fasting lipid panel 7/16.Advised I will send message to Dr.Croitoru.

## 2024-01-29 NOTE — Telephone Encounter (Signed)
 Pt said someone called he because she missed her appt with PharmD on 01/21/24. Pt said she came for the appt. I told there are no notes in the system that she saw the Pharmacist. She insisted she was there.Please advise

## 2024-01-29 NOTE — Telephone Encounter (Signed)
 Pharm D appt was to discuss lipid lowering therapies. No need to reschedule. Will reassess after next lipid profile

## 2024-01-29 NOTE — Telephone Encounter (Signed)
Spoke to patient Dr.Croitoru's advice given. 

## 2024-02-11 ENCOUNTER — Other Ambulatory Visit: Payer: Self-pay

## 2024-02-11 DIAGNOSIS — E7800 Pure hypercholesterolemia, unspecified: Secondary | ICD-10-CM

## 2024-02-11 DIAGNOSIS — I10 Essential (primary) hypertension: Secondary | ICD-10-CM

## 2024-02-11 MED ORDER — HYDROCHLOROTHIAZIDE 25 MG PO TABS: 25.0000 mg | ORAL_TABLET | Freq: Every day | ORAL | 3 refills | Status: AC

## 2024-02-27 ENCOUNTER — Other Ambulatory Visit: Payer: Self-pay | Admitting: Cardiovascular Disease

## 2024-02-27 DIAGNOSIS — E7800 Pure hypercholesterolemia, unspecified: Secondary | ICD-10-CM

## 2024-02-27 DIAGNOSIS — I10 Essential (primary) hypertension: Secondary | ICD-10-CM

## 2024-04-14 NOTE — Progress Notes (Unsigned)
    SUBJECTIVE:   CHIEF COMPLAINT / HPI:   HLD Followed by cardiology, completed Repatha  regimen but states she did not like the shot does not want to go back on it.  Currently taking rosuvastatin  20 mg daily.  Planning to follow-up with repeat lipid panel to ensure continued cholesterol control.  Would like to obtain blood work today if possible  Diabetes Current Regimen: Lifestyle management CBGs: Not checking Last A1c:  Lab Results  Component Value Date   HGBA1C 5.7 04/15/2024    Denies polyuria, polydipsia, hypoglycemia. Last Eye Exam: DUE - going in 06/2024 Statin: Rosuvastatin  20 mg daily ACE/ARB: Losartan  25 mg daily  PERTINENT  PMH / PSH: HTN, CAD, T2DM, HLD, MDD  OBJECTIVE:   BP 121/66   Pulse 65   Ht 5' 2 (1.575 m)   Wt 173 lb 12.8 oz (78.8 kg)   SpO2 99%   BMI 31.79 kg/m    General: NAD, pleasant, able to participate in exam Cardiac: RRR, no murmurs. Respiratory: CTAB, normal effort, No wheezes, rales or rhonchi Extremities: no edema or cyanosis. Skin: warm and dry, no rashes noted Neuro: alert, no obvious focal deficits Psych: Normal affect and mood  ASSESSMENT/PLAN:   Assessment & Plan Controlled type 2 diabetes mellitus without complication, without long-term current use of insulin  (HCC) A1c 5.7, well-controlled with lifestyle management.  Repeat A1c in 6 months. - BMP Pure hypercholesterolemia Follows with cardiology, currently on rosuvastatin  20 mg daily and would not like to restart Repatha  if possible.  Since obtaining other blood work, will obtain repeat lipid panel.  Message cardiology team to make aware. -Lipid panel Encounter for immunization Concerned about measles prevalence as she only received 1 vaccination as a child, will assess for immunity. -MMR titer   Dr. Izetta Nap, DO [facility]

## 2024-04-15 ENCOUNTER — Ambulatory Visit: Admitting: Family Medicine

## 2024-04-15 ENCOUNTER — Encounter: Payer: Self-pay | Admitting: Family Medicine

## 2024-04-15 VITALS — BP 121/66 | HR 65 | Ht 62.0 in | Wt 173.8 lb

## 2024-04-15 DIAGNOSIS — Z23 Encounter for immunization: Secondary | ICD-10-CM

## 2024-04-15 DIAGNOSIS — E119 Type 2 diabetes mellitus without complications: Secondary | ICD-10-CM

## 2024-04-15 DIAGNOSIS — E7800 Pure hypercholesterolemia, unspecified: Secondary | ICD-10-CM

## 2024-04-15 LAB — POCT GLYCOSYLATED HEMOGLOBIN (HGB A1C): HbA1c, POC (controlled diabetic range): 5.7 % (ref 0.0–7.0)

## 2024-04-15 NOTE — Assessment & Plan Note (Signed)
 A1c 5.7, well-controlled with lifestyle management.  Repeat A1c in 6 months. - BMP

## 2024-04-15 NOTE — Assessment & Plan Note (Signed)
 Follows with cardiology, currently on rosuvastatin  20 mg daily and would not like to restart Repatha  if possible.  Since obtaining other blood work, will obtain repeat lipid panel.  Message cardiology team to make aware. -Lipid panel

## 2024-04-15 NOTE — Patient Instructions (Addendum)
 It was wonderful to see you today! Thank you for choosing [facility].   Please bring ALL of your medications with you to every visit.   Today we talked about:  You can get your TDaP vaccine at your pharmacy and your convenience. We are checking your cholesterol, I will communicate with the cardiologist that we obtain this lab today so his team can follow-up with you.  Please continue take your rosuvastatin  as prescribed.  And if your cholesterol is elevated you may need further discussion about medication management options. I am checking your immunity to measles today given you only had 1 vaccine, if it shows that you are not immune we will have you return to get the MMR series to ensure you have immunity to it.  Please follow up in 3 months    We are checking some labs today. If they are abnormal, I will call you. If they are normal, I will send you a MyChart message (if it is active) or a letter in the mail. If you do not hear about your labs in the next 2 weeks, please call the office.  Call the clinic at [phone] if your symptoms worsen or you have any concerns.  Please be sure to schedule follow up at the front desk before you leave today.   Izetta Nap, DO Family Medicine

## 2024-04-16 ENCOUNTER — Ambulatory Visit: Payer: Self-pay | Admitting: Family Medicine

## 2024-04-16 LAB — BASIC METABOLIC PANEL WITH GFR
BUN/Creatinine Ratio: 15 (ref 12–28)
BUN: 12 mg/dL (ref 8–27)
CO2: 26 mmol/L (ref 20–29)
Calcium: 10.2 mg/dL (ref 8.7–10.3)
Chloride: 98 mmol/L (ref 96–106)
Creatinine, Ser: 0.78 mg/dL (ref 0.57–1.00)
Glucose: 99 mg/dL (ref 70–99)
Potassium: 4.2 mmol/L (ref 3.5–5.2)
Sodium: 139 mmol/L (ref 134–144)
eGFR: 81 mL/min/1.73 (ref >59)

## 2024-04-16 LAB — LIPID PANEL
Chol/HDL Ratio: 2.3 ratio (ref 0.0–4.4)
Cholesterol, Total: 135 mg/dL (ref 100–199)
HDL: 58 mg/dL (ref >39)
LDL Chol Calc (NIH): 61 mg/dL (ref 0–99)
Triglycerides: 84 mg/dL (ref 0–149)
VLDL Cholesterol Cal: 16 mg/dL (ref 5–40)

## 2024-04-16 LAB — MEASLES/MUMPS/RUBELLA IMMUNITY
MUMPS ABS, IGG: 236 [AU]/ml (ref >10.9)
RUBEOLA AB, IGG: >300 [AU]/ml (ref >16.4)
Rubella Antibodies, IGG: 11.8 {index} (ref >0.99)

## 2024-04-21 ENCOUNTER — Telehealth: Payer: Self-pay | Admitting: Cardiovascular Disease

## 2024-04-21 NOTE — Telephone Encounter (Signed)
 Please review pcp labs in chart and let us  know if any further labs needed.

## 2024-04-21 NOTE — Telephone Encounter (Signed)
 Patient states that she had labs done at her PCP, calling to see if she still needs to get labs for our office. Please advise

## 2024-04-21 NOTE — Telephone Encounter (Signed)
 We have all the labs we need.  No additional testing necessary.  Thank you

## 2024-04-21 NOTE — Telephone Encounter (Signed)
 S/W pt, relayed feedback per Dr. Francyne. No other labs needed, pt verbalizes understanding and will keep upcoming f/u appt.

## 2024-05-15 ENCOUNTER — Other Ambulatory Visit (HOSPITAL_BASED_OUTPATIENT_CLINIC_OR_DEPARTMENT_OTHER)

## 2024-05-15 ENCOUNTER — Encounter (HOSPITAL_BASED_OUTPATIENT_CLINIC_OR_DEPARTMENT_OTHER): Payer: Self-pay

## 2024-05-28 ENCOUNTER — Ambulatory Visit (INDEPENDENT_AMBULATORY_CARE_PROVIDER_SITE_OTHER): Admitting: Family Medicine

## 2024-05-28 VITALS — BP 138/88 | HR 75 | Ht 62.0 in | Wt 174.6 lb

## 2024-05-28 DIAGNOSIS — H0014 Chalazion left upper eyelid: Secondary | ICD-10-CM

## 2024-05-28 NOTE — Progress Notes (Signed)
    SUBJECTIVE:   CHIEF COMPLAINT / HPI:   Stye evaluation On the left upper eyelid. It is sore. Started last Tuesday. She has been using OTC stye treatment and warm water  compress without much improvement. She has been unable to follow up with her eye doctor. She has noticed some clear drainage from the area overnight. Intermittently with blurry vision. It makes it eyelid heavy. No fevers. Never had before. No pain with eye movement.  PERTINENT  PMH / PSH: CAD, type 2 diabetes,  OBJECTIVE:   BP 138/88   Pulse 75   Ht 5' 2 (1.575 m)   Wt 174 lb 9.6 oz (79.2 kg)   SpO2 96%   BMI 31.93 kg/m   General: Alert and oriented, in NAD Skin: Warm, dry, and intact without lesions HEENT: NCAT, EOM grossly normal, midline nasal septum; left upper eyelid with large amount of swelling and firm nodule in the body of the eyelid; no purulence or drainage appreciated Respiratory: Breathing and speaking comfortably on RA Extremities: Moves all extremities grossly equally Neurological: No gross focal deficit Psychiatric: Appropriate mood and affect     ASSESSMENT/PLAN:   Assessment & Plan Chalazion of left upper eyelid Reassuringly without signs of localized or extended infection.  Visual acuity today normal.  Gave handout on care and recommended warm compresses for up to 15 minutes around 5 times per day to help the area drain.  She has attempted to get in touch with her eye doctor.  She will let us  know if she has worsening symptoms despite warm compresses such as localized pain or pain with EOM, redness, or visual difficulties.   Stuart Redo, MD [facility]

## 2024-05-28 NOTE — Patient Instructions (Signed)
 You have a chalazion. Continue to use warm compresses for 5 minutes for 5 times per day. Let me know if this worsens or if you have worsening vision, pain with movement of the eyes, or any fevers.

## 2024-06-18 ENCOUNTER — Ambulatory Visit
Admission: RE | Admit: 2024-06-18 | Discharge: 2024-06-18 | Disposition: A | Discharge status: Home / Self Care | Payer: 59 | Source: Ambulatory Visit | Attending: Student

## 2024-06-18 ENCOUNTER — Other Ambulatory Visit: Payer: 59

## 2024-06-18 DIAGNOSIS — E2839 Other primary ovarian failure: Secondary | ICD-10-CM

## 2024-06-18 DIAGNOSIS — Z1231 Encounter for screening mammogram for malignant neoplasm of breast: Secondary | ICD-10-CM | POA: Diagnosis not present

## 2024-06-23 ENCOUNTER — Ambulatory Visit

## 2024-06-23 VITALS — Ht 62.0 in | Wt 168.0 lb

## 2024-06-23 DIAGNOSIS — Z0000 Encounter for general adult medical examination without abnormal findings: Secondary | ICD-10-CM | POA: Diagnosis not present

## 2024-06-23 NOTE — Progress Notes (Addendum)
 Because this visit was a virtual/telehealth visit,  certain criteria was not obtained, such a blood pressure, CBG if applicable, and timed get up and go. Any medications not marked as taking were not mentioned during the medication reconciliation part of the visit. Any vitals not documented were not able to be obtained due to this being a telehealth visit or patient was unable to self-report a recent blood pressure reading due to a lack of equipment at home via telehealth. Vitals that have been documented are verbally provided by the patient.   Subjective:   Terri Estrada is a 73 y.o. who presents for a Medicare Wellness preventive visit.  As a reminder, Annual Wellness Visits don't include a physical exam, and some assessments may be limited, especially if this visit is performed virtually. We may recommend an in-person follow-up visit with your provider if needed.  Visit Complete: Virtual I connected with  Karna Kathyrn Lowers on 06/23/24 by a audio enabled telemedicine application and verified that I am speaking with the correct person using two identifiers.  Patient Location: Home  Provider Location: Home Office  I discussed the limitations of evaluation and management by telemedicine. The patient expressed understanding and agreed to proceed.  Vital Signs: Because this visit was a virtual/telehealth visit, some criteria may be missing or patient reported. Any vitals not documented were not able to be obtained and vitals that have been documented are patient reported.  VideoDeclined- This patient declined Librarian, academic. Therefore the visit was completed with audio only.  Persons Participating in Visit: Patient.  AWV Questionnaire: Yes, questionnaire was completed before scheduled AWV on 06/17/2024.  Cardiac Risk Factors include: advanced age (>52men, >5 women);diabetes mellitus;dyslipidemia;family history of premature cardiovascular  disease;hypertension;obesity (BMI >30kg/m2)     Objective:    Today's Vitals   06/23/24 1155 06/23/24 1157  Weight: 168 lb (76.2 kg)   Height: 5' 2 (1.575 m)   PainSc: 0-No pain 0-No pain   Body mass index is 30.73 kg/m.     06/23/2024   12:04 PM 01/01/2024    9:39 AM 04/10/2023   11:37 AM 02/08/2023   10:53 AM 04/17/2022    8:26 AM 08/31/2021    1:35 PM 04/29/2021    9:44 AM  Advanced Directives  Does Patient Have a Medical Advance Directive? No No No No No No No  Would patient like information on creating a medical advance directive? No - Patient declined No - Patient declined No - Patient declined No - Patient declined No - Patient declined No - Patient declined No - Patient declined    Current Medications (verified) Outpatient Encounter Medications as of 06/23/2024  Medication Sig   acetaminophen  (TYLENOL ) 650 MG CR tablet Take 1,300 mg by mouth in the morning and at bedtime.   amLODipine  (NORVASC ) 10 MG tablet TAKE 1 TABLET BY MOUTH DAILY   aspirin  EC 81 MG tablet Take 1 tablet (81 mg total) by mouth daily.   calcium  carbonate (OSCAL) 1500 (600 Ca) MG TABS tablet Take 600 mg by mouth 2 (two) times daily.   carboxymethylcellulose (REFRESH PLUS) 0.5 % SOLN Place 1 drop into both eyes 3 (three) times daily as needed (dry/irritated eyes.).   cetirizine  (ZYRTEC ) 10 MG tablet TAKE ONE TABLET BY MOUTH DAILY   Cholecalciferol 25 MCG (1000 UT) tablet Take 1,000 Units by mouth daily.   Cinnamon 500 MG TABS Take 500 mg by mouth daily.   Coenzyme Q10 (COQ10) 200 MG CAPS Take 200  mg by mouth in the morning and at bedtime.   ezetimibe  (ZETIA ) 10 MG tablet TAKE 1 TABLET BY MOUTH DAILY   hydrochlorothiazide  (HYDRODIURIL ) 25 MG tablet Take 1 tablet (25 mg total) by mouth daily.   isosorbide  mononitrate (IMDUR ) 30 MG 24 hr tablet TAKE 1 TABLET BY MOUTH DAILY   losartan  (COZAAR ) 25 MG tablet TAKE 1 TABLET BY MOUTH DAILY   Multiple Vitamins-Minerals (MULTIVITAMINS THER. W/MINERALS) TABS Take 1  tablet by mouth every evening.   Probiotic Product (PROBIOTIC DAILY) CAPS Take 1 capsule by mouth every evening.   rosuvastatin  (CRESTOR ) 20 MG tablet Take 1 tablet (20 mg total) by mouth daily.   traMADol  (ULTRAM ) 50 MG tablet Take 1 tablet (50 mg total) by mouth daily as needed.   No facility-administered encounter medications on file as of 06/23/2024.    Allergies (verified) Atorvastatin , Ace inhibitors, Covid-19 mrna vacc (moderna), Shellfish allergy, and Zocor [simvastatin]   History: Past Medical History:  Diagnosis Date   CAD in native artery    a. MI 2001 s/p PTCA/stenting to mid-distal junction of RCA - residual dx treated medically.   Depression    pt reports having it intermittently   Diverticulosis of colon (without mention of hemorrhage)    Essential hypertension, benign    MI (myocardial infarction) (HCC)    Mild pulmonary hypertension (HCC)    Obesity, unspecified    Onychia and paronychia of toe    Osteoarthrosis, unspecified whether generalized or localized, unspecified site    Other and unspecified hyperlipidemia    PONV (postoperative nausea and vomiting)    Sickle-cell trait (HCC)    Sinus bradycardia    a. HR 40s even on low dose metoprolol  - BB stopped with resolution.   Type II or unspecified type diabetes mellitus without mention of complication, not stated as uncontrolled    Past Surgical History:  Procedure Laterality Date   ANGIOPLASTY  09/21/2000   CARDIAC CATHETERIZATION     JOINT REPLACEMENT     TOTAL KNEE ARTHROPLASTY Right 02/23/2020   Procedure: RIGHT TOTAL KNEE ARTHROPLASTY;  Surgeon: Jerri Kay HERO, MD;  Location: [facility] OR;  Service: Orthopedics;  Laterality: Right;   TOTAL KNEE ARTHROPLASTY Left 12/27/2020   Procedure: LEFT TOTAL KNEE ARTHROPLASTY;  Surgeon: Jerri Kay HERO, MD;  Location: [facility] OR;  Service: Orthopedics;  Laterality: Left;   Family History  Problem Relation Age of Onset   Hypertension Mother    Diabetes type II Mother     Diabetes type II Sister    Breast cancer Sister        58s   Breast cancer Sister    Sickle cell anemia Daughter    Cancer Other    Stroke Other    Diabetes Other    Social History   Socioeconomic History   Marital status: Legally Separated    Spouse name: Not on file   Number of children: 2   Years of education: Not on file   Highest education level: 12th grade  Occupational History   Occupation: DAY Associate Professor: SELF-EMPLOYED  Tobacco Use   Smoking status: Former    Current packs/day: 0.00    Average packs/day: 0.3 packs/day for 5.0 years (1.3 ttl pk-yrs)    Types: Cigarettes    Start date: 27    Quit date: 2001    Years since quitting: 24.7   Smokeless tobacco: Never  Vaping Use   Vaping status: Never Used  Substance and Sexual Activity  Alcohol use: No   Drug use: No   Sexual activity: Not Currently    Birth control/protection: Post-menopausal  Other Topics Concern   Not on file  Social History Narrative   Not on file   Social Drivers of Health   Financial Resource Strain: Low Risk  (06/23/2024)   Overall Financial Resource Strain (CARDIA)    Difficulty of Paying Living Expenses: Not very hard  Food Insecurity: Food Insecurity Present (06/23/2024)   Hunger Vital Sign    Worried About Running Out of Food in the Last Year: Sometimes true    Ran Out of Food in the Last Year: Sometimes true  Transportation Needs: No Transportation Needs (06/23/2024)   PRAPARE - Administrator, Civil Service (Medical): No    Lack of Transportation (Non-Medical): No  Physical Activity: Sufficiently Active (06/23/2024)   Exercise Vital Sign    Days of Exercise per Week: 5 days    Minutes of Exercise per Session: 60 min  Stress: No Stress Concern Present (06/23/2024)   [employer] of Occupational Health - Occupational Stress Questionnaire    Feeling of Stress: Only a little  Social Connections: Moderately Integrated (06/23/2024)   Social Connection and  Isolation Panel    Frequency of Communication with Friends and Family: More than three times a week    Frequency of Social Gatherings with Friends and Family: More than three times a week    Attends Religious Services: 1 to 4 times per year    Active Member of [employer] or Organizations: Yes    Attends Engineer, structural: More than 4 times per year    Marital Status: Separated    Tobacco Counseling Counseling given: No    Clinical Intake:  Pre-visit preparation completed: Yes  Pain : No/denies pain Pain Score: 0-No pain     BMI - recorded: 30.73 Nutritional Status: BMI > 30  Obese Nutritional Risks: None Diabetes: Yes CBG done?: No Did pt. bring in CBG monitor from home?: No  Lab Results  Component Value Date   HGBA1C 5.7 04/15/2024   HGBA1C 6.0 01/01/2024   HGBA1C 5.9 02/08/2023     How often do you need to have someone help you when you read instructions, pamphlets, or other written materials from your doctor or pharmacy?: 1 - Never  Interpreter Needed?: No  Information entered by :: Mical Kicklighter N. Lesa Vandall, LPN.   Activities of Daily Living     06/23/2024   11:59 AM 06/17/2024    6:54 PM  In your present state of health, do you have any difficulty performing the following activities:  Hearing? 0 0  Vision? 0 0  Difficulty concentrating or making decisions? 0 0  Walking or climbing stairs? 0 0  Dressing or bathing? 0 0  Doing errands, shopping? 0 0  Preparing Food and eating ? N N  Using the Toilet? N N  In the past six months, have you accidently leaked urine? Y Y  Do you have problems with loss of bowel control? N N  Managing your Medications? N N  Managing your Finances? N N  Housekeeping or managing your Housekeeping? N N    Patient Care Team: Theophilus Pagan, MD as PCP - General (Family Medicine) Croitoru, Jerel, MD as PCP - Cardiology (Cardiology)  I have updated your Care Teams any recent Medical Services you may have received from other  providers in the past year.     Assessment:   This is a routine wellness  examination for [facility].  Hearing/Vision screen Hearing Screening - Comments:: Adequate hearing. Vision Screening - Comments:: Adequate vision with eyeglasses.  Patient goes to [employer], [place].   Goals Addressed             This Visit's Progress    Patient Stated       To keep my HgA1C below 5.7%. Keep walking for physical exercise everyday. Staying independent and maintain my health.       Depression Screen     06/23/2024   12:02 PM 05/28/2024   10:48 AM 04/15/2024    9:58 AM 01/01/2024    9:41 AM 04/10/2023   11:46 AM 02/08/2023   10:52 AM 09/06/2022    8:49 AM  PHQ 2/9 Scores  PHQ - 2 Score 1 0 1  2 0 0  PHQ- 9 Score 2 0 1  2 0 0  Exception Documentation    Patient refusal       Fall Risk     06/23/2024   11:59 AM 06/17/2024    6:54 PM 05/28/2024   10:47 AM 04/15/2024    9:53 AM 01/01/2024    9:39 AM  Fall Risk   Falls in the past year? 1 1 0 0 0  Number falls in past yr: 0 0 0  0  Injury with Fall? 0 0 0  0  Follow up Falls evaluation completed;Education provided  Falls evaluation completed      MEDICARE RISK AT HOME:  Medicare Risk at Home Any stairs in or around the home?: Yes If so, are there any without handrails?: No Home free of loose throw rugs in walkways, pet beds, electrical cords, etc?: Yes Adequate lighting in your home to reduce risk of falls?: Yes Life alert?: No Use of a cane, walker or w/c?: No Grab bars in the bathroom?: Yes Shower chair or bench in shower?: Yes Elevated toilet seat or a handicapped toilet?: No  TIMED UP AND GO:  Was the test performed?  No  Cognitive Function: 6CIT completed    06/23/2024   12:02 PM  MMSE - Mini Mental State Exam  Not completed: Unable to complete        06/23/2024   12:04 PM 04/10/2023   11:41 AM  6CIT Screen  What Year? 0 points 0 points  What month? 0 points 0 points  What time? 0 points   Count back from 20 0 points 0  points  Months in reverse 0 points 2 points  Repeat phrase 0 points 2 points  Total Score 0 points     Immunizations Immunization History  Administered Date(s) Administered    sv, Bivalent, Protein Subunit Rsvpref,pf [place]) 08/26/2022   INFLUENZA, HIGH DOSE SEASONAL PF 07/15/2019   Influenza Split 06/21/2011, 07/05/2012   Influenza Whole 08/06/2007, 07/15/2008   Influenza,inj,Quad PF,6+ Mos 08/19/2014, 09/01/2015, 08/18/2016, 08/09/2017, 08/23/2018   Influenza-Unspecified 07/09/2020, 08/22/2023   PFIZER Comirnaty(Gray Top)Covid-19 Tri-Sucrose Vaccine 04/29/2021   PFIZER(Purple Top)SARS-COV-2 Vaccination 12/01/2019, 12/22/2019, 09/08/2020   PPD Test 12/17/2012, 05/01/2017   Pfizer Covid-19 Vaccine Bivalent Booster 20yrs & up 11/22/2021   Pfizer(Comirnaty)Fall Seasonal Vaccine 12 years and older 09/14/2022   Pneumococcal Conjugate-13 09/03/2017   Pneumococcal Polysaccharide-23 09/09/2012, 06/18/2020   Td 09/08/2002   Tdap 09/09/2012   Zoster Recombinant(Shingrix) 05/04/2018, 07/16/2018    Screening Tests Health Maintenance  Topic Date Due   DTaP/Tdap/Td (3 - Td or Tdap) 09/09/2022   OPHTHALMOLOGY EXAM  07/13/2023   Influenza Vaccine  05/09/2024   COVID-19  Vaccine (7 - 2025-26 season) 06/09/2024   FOOT EXAM  08/26/2024   HEMOGLOBIN A1C  10/16/2024   Diabetic kidney evaluation - Urine ACR  01/01/2025   Diabetic kidney evaluation - eGFR measurement  04/15/2025   Medicare Annual Wellness (AWV)  06/23/2025   Mammogram  06/18/2026   Colonoscopy  12/02/2028   Pneumococcal Vaccine: 50+ Years  Completed   DEXA SCAN  Completed   Hepatitis C Screening  Completed   Zoster Vaccines- Shingrix  Completed   HPV VACCINES  Aged Out   Meningococcal B Vaccine  Aged Out    Health Maintenance Items Addressed: Yes Patient aware of current care gaps.  Patient is due for the following: Flu, Covid, Dtap vaccines.  NCIR was verified and record updated.  Additional Screening:  Vision  Screening: Recommended annual ophthalmology exams for early detection of glaucoma and other disorders of the eye. Is the patient up to date with their annual eye exam?  Yes  Who is the provider or what is the name of the office in which the patient attends annual eye exams? Selinda Reusing, OD.  Dental Screening: Recommended annual dental exams for proper oral hygiene  Community Resource Referral / Chronic Care Management: CRR required this visit?  No   CCM required this visit?  No   Plan:    I have personally reviewed and noted the following in the patient's chart:   Medical and social history Use of alcohol, tobacco or illicit drugs  Current medications and supplements including opioid prescriptions. Patient is not currently taking opioid prescriptions. Functional ability and status Nutritional status Physical activity Advanced directives List of other physicians Hospitalizations, surgeries, and ER visits in previous 12 months Vitals Screenings to include cognitive, depression, and falls Referrals and appointments  In addition, I have reviewed and discussed with patient certain preventive protocols, quality metrics, and best practice recommendations. A written personalized care plan for preventive services as well as general preventive health recommendations were provided to patient.   Roz LOISE Fuller, LPN   [id number]   After Visit Summary: (MyChart) Due to this being a telephonic visit, the after visit summary with patients personalized plan was offered to patient via MyChart   Notes: Nothing significant to report at this time.

## 2024-06-23 NOTE — Patient Instructions (Signed)
 Terri Estrada,  Thank you for taking the time for your Medicare Wellness Visit. I appreciate your continued commitment to your health goals. Please review the care plan we discussed, and feel free to reach out if I can assist you further.  Medicare recommends these wellness visits once per year to help you and your care team stay ahead of potential health issues. These visits are designed to focus on prevention, allowing your provider to concentrate on managing your acute and chronic conditions during your regular appointments.  Please note that Annual Wellness Visits do not include a physical exam. Some assessments may be limited, especially if the visit was conducted virtually. If needed, we may recommend a separate in-person follow-up with your provider.  Ongoing Care Seeing your primary care provider every 3 to 6 months helps us  monitor your health and provide consistent, personalized care.   Referrals If a referral was made during today's visit and you haven't received any updates within two weeks, please contact the referred provider directly to check on the status.  Recommended Screenings:  Health Maintenance  Topic Date Due   DTaP/Tdap/Td vaccine (3 - Td or Tdap) 09/09/2022   Eye exam for diabetics  07/13/2023   Flu Shot  05/09/2024   COVID-19 Vaccine (7 - 2025-26 season) 06/09/2024   Complete foot exam   08/26/2024   Hemoglobin A1C  10/16/2024   Yearly kidney health urinalysis for diabetes  01/01/2025   Yearly kidney function blood test for diabetes  04/15/2025   Medicare Annual Wellness Visit  06/23/2025   Breast Cancer Screening  06/18/2026   Colon Cancer Screening  12/02/2028   Pneumococcal Vaccine for age over 59  Completed   DEXA scan (bone density measurement)  Completed   Hepatitis C Screening  Completed   Zoster (Shingles) Vaccine  Completed   HPV Vaccine  Aged Out   Meningitis B Vaccine  Aged Out       06/23/2024   12:04 PM  Advanced Directives  Does Patient  Have a Medical Advance Directive? No  Would patient like information on creating a medical advance directive? No - Patient declined   Advance Care Planning is important because it: Ensures you receive medical care that aligns with your values, goals, and preferences. Provides guidance to your family and loved ones, reducing the emotional burden of decision-making during critical moments.  Vision: Annual vision screenings are recommended for early detection of glaucoma, cataracts, and diabetic retinopathy. These exams can also reveal signs of chronic conditions such as diabetes and high blood pressure.  Dental: Annual dental screenings help detect early signs of oral cancer, gum disease, and other conditions linked to overall health, including heart disease and diabetes.  Please see the attached documents for additional preventive care recommendations.

## 2024-06-24 NOTE — Progress Notes (Deleted)
   SUBJECTIVE:   CHIEF COMPLAINT / HPI:   ***  PERTINENT  PMH / PSH: ***  OBJECTIVE:   There were no vitals taken for this visit. ***  General: NAD, pleasant, able to participate in exam Cardiac: RRR, no murmurs. Respiratory: CTAB, normal effort, No wheezes, rales or rhonchi Abdomen: Bowel sounds present, nontender, nondistended Extremities: no edema or cyanosis. Skin: warm and dry, no rashes noted Neuro: alert, no obvious focal deficits Psych: Normal affect and mood  ASSESSMENT/PLAN:   No problem-specific Assessment & Plan notes found for this encounter.     Dr. Izetta Nap, DO [facility]    {    This will disappear when note is signed, click to select method of visit    :1}

## 2024-06-25 ENCOUNTER — Ambulatory Visit: Admitting: Family Medicine

## 2024-06-26 DIAGNOSIS — Z4689 Encounter for fitting and adjustment of other specified devices: Secondary | ICD-10-CM | POA: Diagnosis not present

## 2024-06-26 DIAGNOSIS — N8111 Cystocele, midline: Secondary | ICD-10-CM | POA: Diagnosis not present

## 2024-07-09 DIAGNOSIS — H5203 Hypermetropia, bilateral: Secondary | ICD-10-CM | POA: Diagnosis not present

## 2024-07-09 DIAGNOSIS — H31001 Unspecified chorioretinal scars, right eye: Secondary | ICD-10-CM | POA: Diagnosis not present

## 2024-07-09 DIAGNOSIS — H52203 Unspecified astigmatism, bilateral: Secondary | ICD-10-CM | POA: Diagnosis not present

## 2024-07-09 DIAGNOSIS — E119 Type 2 diabetes mellitus without complications: Secondary | ICD-10-CM | POA: Diagnosis not present

## 2024-07-09 DIAGNOSIS — H25813 Combined forms of age-related cataract, bilateral: Secondary | ICD-10-CM | POA: Diagnosis not present

## 2024-07-09 LAB — OPHTHALMOLOGY REPORT-SCANNED

## 2024-08-11 ENCOUNTER — Encounter: Payer: Self-pay | Admitting: Radiology

## 2024-08-12 NOTE — Progress Notes (Unsigned)
    SUBJECTIVE:   CHIEF COMPLAINT / HPI:   Chalazion Seen for choices of upper left eyelid on 05/28/2024, recommended warm compresses and follow-up with ophthalmology.  Weight loss Down around 18 pounds since the beginning of the year.  PERTINENT  PMH / PSH: HTN, CAD, T2DM, HLD, MDD   OBJECTIVE:   There were no vitals taken for this visit. ***  General: NAD, pleasant, able to participate in exam Cardiac: RRR, no murmurs. Respiratory: CTAB, normal effort, No wheezes, rales or rhonchi Abdomen: Bowel sounds present, nontender, nondistended Extremities: no edema or cyanosis. Skin: warm and dry, no rashes noted Neuro: alert, no obvious focal deficits Psych: Normal affect and mood  ASSESSMENT/PLAN:   No problem-specific Assessment & Plan notes found for this encounter.     Dr. Izetta Nap, DO [facility]    {    This will disappear when note is signed, click to select method of visit    :1}

## 2024-08-13 ENCOUNTER — Encounter: Payer: Self-pay | Admitting: Family Medicine

## 2024-08-13 ENCOUNTER — Ambulatory Visit: Admitting: Family Medicine

## 2024-08-13 VITALS — BP 120/81 | HR 90 | Ht 62.0 in | Wt 164.4 lb

## 2024-08-13 DIAGNOSIS — Z23 Encounter for immunization: Secondary | ICD-10-CM

## 2024-08-13 DIAGNOSIS — R634 Abnormal weight loss: Secondary | ICD-10-CM | POA: Diagnosis not present

## 2024-08-13 DIAGNOSIS — M170 Bilateral primary osteoarthritis of knee: Secondary | ICD-10-CM

## 2024-08-13 MED ORDER — TRAMADOL HCL 50 MG PO TABS: 50.0000 mg | ORAL_TABLET | Freq: Every day | ORAL | 2 refills | Status: AC | PRN

## 2024-08-13 NOTE — Assessment & Plan Note (Signed)
 PDMP reviewed and appropriate, sparingly using tramadol  for severe pain. -Refill tramadol  50 mg daily as needed

## 2024-08-13 NOTE — Patient Instructions (Signed)
 It was wonderful to see you today! Thank you for choosing [facility].   Please bring ALL of your medications with you to every visit.   Today we talked about:  Your weight is down a couple of pounds today from your last visit but I think given your healthy eating and activity this is reasonable.  I do not see any other red flags for your weight loss.  Continue to weigh yourself at home and if you dip below that 150 and cannot maintain your weight then please come back and see me to discuss further.  Otherwise keep up the great work with eating a healthy diet and continuing to walk for aerobic activity. I refilled your tramadol  today, please continue to use it sparingly for days when you have severe pain.  If you have any issues with it or concerns please let me know.  Please follow up in 3 months   Call the clinic at [phone] if your symptoms worsen or you have any concerns.  Please be sure to schedule follow up at the front desk before you leave today.   Izetta Nap, DO Family Medicine

## 2024-08-28 ENCOUNTER — Ambulatory Visit (INDEPENDENT_AMBULATORY_CARE_PROVIDER_SITE_OTHER): Payer: 59 | Admitting: Podiatry

## 2024-08-28 ENCOUNTER — Encounter: Payer: Self-pay | Admitting: Podiatry

## 2024-08-28 DIAGNOSIS — E119 Type 2 diabetes mellitus without complications: Secondary | ICD-10-CM

## 2024-08-28 DIAGNOSIS — L819 Disorder of pigmentation, unspecified: Secondary | ICD-10-CM

## 2024-08-28 DIAGNOSIS — Z0189 Encounter for other specified special examinations: Secondary | ICD-10-CM | POA: Diagnosis not present

## 2024-08-28 NOTE — Patient Instructions (Signed)
 I have put in a referral for dermatology to check the spot on the left foot. If you do not hear for them about scheduling within the next 1 week, or you have any questions please give us  a call at [phone].

## 2024-08-28 NOTE — Progress Notes (Signed)
 Subjective: Chief Complaint  Patient presents with   [facility]    Rm13 Diabetic foot care/A1c 5.7/pt complains of some numbness in right foot/     73 year old female presents the office today for diabetic foot exam.  She states that he is doing well.  She does not report any ulcerations.  She said that she has been doing nerve pain, sciatica down the right leg and she saw her PCP for this.  When she wears compression socks it does help.  Last A1c was 5.9 on Feb 08, 2023  Theophilus Pagan, MD Last seen 08/13/2024  Objective: AAO x3, NAD DP/PT pulses palpable bilaterally, CRT less than 3 seconds Sensation intact with Semmes Weinstein monofilament. Bunions present bilaterally with hammertoe contracture noted at the second digit.  There is no skin breakdown, callus formation noted of the second toe today however there are no open lesion identified at this time.  Not able to appreciate any area of pinpoint tenderness. There is a hyperpigmented skin lesion noted on the fourth interspace of the left foot as pictured below.  She states that has been there about a year.  No recent treatment. No pain with calf compression, swelling, warmth, erythema    Assessment: 73 year old female presents today for diabetic foot exam; hyperpigmented skin lesion  Plan: -All treatment options discussed with the patient including all alternatives, risks, complications.  -Overall she is doing well.  Discussed importance of daily foot inspection, continue good glucose control.  Regards to nerve pain on the right leg she states that is not all the time and she was seen by her PCP.  Is not improving recommend follow back up with her PCP for further evaluation. - Discussed biopsy possibly of the skin lesion on left foot.  Will refer to dermatology for evaluation of this lesion.  Return in about 1 year (around 08/28/2025) for Diabetic foot exam.  Donnice JONELLE Fees DPM

## 2024-09-02 ENCOUNTER — Encounter: Payer: Self-pay | Admitting: Lab

## 2024-09-02 ENCOUNTER — Telehealth: Payer: Self-pay | Admitting: Podiatry

## 2024-09-02 NOTE — Telephone Encounter (Signed)
 Referral sent 09/02/24.

## 2024-09-02 NOTE — Telephone Encounter (Signed)
 I had placed a referral to dermatology ([facility]) last Thursday. Can we make sure they received it? Thanks!

## 2025-01-12 ENCOUNTER — Other Ambulatory Visit (HOSPITAL_BASED_OUTPATIENT_CLINIC_OR_DEPARTMENT_OTHER)

## 2025-01-13 ENCOUNTER — Other Ambulatory Visit (HOSPITAL_BASED_OUTPATIENT_CLINIC_OR_DEPARTMENT_OTHER)

## 2025-03-19 ENCOUNTER — Ambulatory Visit: Payer: 59 | Admitting: Orthopaedic Surgery

## 2025-04-15 ENCOUNTER — Ambulatory Visit: Admitting: Physician Assistant

## 2025-08-28 ENCOUNTER — Ambulatory Visit: Admitting: Podiatry
